# Patient Record
Sex: Female | Born: 1940
Health system: Southern US, Community
[De-identification: ages and names within clinical notes are randomized; demographics above are authoritative.]

## PROBLEM LIST (undated history)

## (undated) DIAGNOSIS — K219 Gastro-esophageal reflux disease without esophagitis: Secondary | ICD-10-CM

## (undated) DIAGNOSIS — IMO0001 Reserved for inherently not codable concepts without codable children: Secondary | ICD-10-CM

## (undated) DIAGNOSIS — J189 Pneumonia, unspecified organism: Secondary | ICD-10-CM

## (undated) DIAGNOSIS — G4730 Sleep apnea, unspecified: Secondary | ICD-10-CM

## (undated) DIAGNOSIS — I10 Essential (primary) hypertension: Secondary | ICD-10-CM

## (undated) DIAGNOSIS — E1140 Type 2 diabetes mellitus with diabetic neuropathy, unspecified: Secondary | ICD-10-CM

## (undated) DIAGNOSIS — M1990 Unspecified osteoarthritis, unspecified site: Secondary | ICD-10-CM

## (undated) DIAGNOSIS — N289 Disorder of kidney and ureter, unspecified: Secondary | ICD-10-CM

## (undated) DIAGNOSIS — E079 Disorder of thyroid, unspecified: Secondary | ICD-10-CM

## (undated) HISTORY — PX: CHOLECYSTECTOMY: SHX55

## (undated) HISTORY — DX: Essential (primary) hypertension: I10

## (undated) HISTORY — DX: Unspecified osteoarthritis, unspecified site: M19.90

## (undated) HISTORY — PX: PARTIAL HIP ARTHROPLASTY: SHX733

## (undated) HISTORY — PX: TEMPOROMANDIBULAR JOINT SURGERY: SHX35

## (undated) HISTORY — DX: Disorder of thyroid, unspecified: E07.9

## (undated) HISTORY — PX: TUBAL LIGATION: SHX77

## (undated) HISTORY — PX: PARTIAL KNEE ARTHROPLASTY: SHX2174

## (undated) HISTORY — PX: TONSILLECTOMY: SUR1361

## (undated) HISTORY — DX: Disorder of kidney and ureter, unspecified: N28.9

## (undated) HISTORY — DX: Pneumonia, unspecified organism: J18.9

---

## 1997-11-09 ENCOUNTER — Other Ambulatory Visit: Admission: RE | Admit: 1997-11-09 | Discharge: 1997-11-09 | Payer: Self-pay | Admitting: *Deleted

## 1997-12-27 ENCOUNTER — Ambulatory Visit (HOSPITAL_COMMUNITY): Admission: RE | Admit: 1997-12-27 | Discharge: 1997-12-27 | Payer: Self-pay | Admitting: *Deleted

## 1998-05-13 ENCOUNTER — Encounter: Payer: Self-pay | Admitting: Orthopedic Surgery

## 1998-05-16 ENCOUNTER — Inpatient Hospital Stay (HOSPITAL_COMMUNITY): Admission: RE | Admit: 1998-05-16 | Discharge: 1998-05-24 | Payer: Self-pay | Admitting: Orthopedic Surgery

## 1998-05-16 ENCOUNTER — Encounter: Payer: Self-pay | Admitting: Orthopedic Surgery

## 1998-06-20 ENCOUNTER — Encounter: Admission: RE | Admit: 1998-06-20 | Discharge: 1998-09-02 | Payer: Self-pay | Admitting: Orthopedic Surgery

## 1998-06-30 ENCOUNTER — Encounter: Payer: Self-pay | Admitting: Anesthesiology

## 1998-06-30 ENCOUNTER — Encounter: Admission: RE | Admit: 1998-06-30 | Discharge: 1998-09-28 | Payer: Self-pay | Admitting: Anesthesiology

## 1998-09-19 ENCOUNTER — Ambulatory Visit (HOSPITAL_BASED_OUTPATIENT_CLINIC_OR_DEPARTMENT_OTHER): Admission: RE | Admit: 1998-09-19 | Discharge: 1998-09-19 | Payer: Self-pay | Admitting: Orthopedic Surgery

## 1998-09-20 ENCOUNTER — Encounter: Admission: RE | Admit: 1998-09-20 | Discharge: 1998-11-07 | Payer: Self-pay | Admitting: Orthopedic Surgery

## 1998-11-22 ENCOUNTER — Encounter: Admission: RE | Admit: 1998-11-22 | Discharge: 1999-02-20 | Payer: Self-pay | Admitting: Orthopedic Surgery

## 1999-01-02 ENCOUNTER — Other Ambulatory Visit: Admission: RE | Admit: 1999-01-02 | Discharge: 1999-01-02 | Payer: Self-pay | Admitting: *Deleted

## 1999-03-22 ENCOUNTER — Ambulatory Visit (HOSPITAL_COMMUNITY): Admission: RE | Admit: 1999-03-22 | Discharge: 1999-03-22 | Payer: Self-pay | Admitting: *Deleted

## 1999-04-14 ENCOUNTER — Ambulatory Visit (HOSPITAL_COMMUNITY): Admission: RE | Admit: 1999-04-14 | Discharge: 1999-04-14 | Payer: Self-pay | Admitting: Gastroenterology

## 1999-05-11 ENCOUNTER — Encounter (INDEPENDENT_AMBULATORY_CARE_PROVIDER_SITE_OTHER): Payer: Self-pay | Admitting: Specialist

## 1999-05-11 ENCOUNTER — Other Ambulatory Visit: Admission: RE | Admit: 1999-05-11 | Discharge: 1999-05-11 | Payer: Self-pay | Admitting: *Deleted

## 1999-06-13 ENCOUNTER — Encounter (INDEPENDENT_AMBULATORY_CARE_PROVIDER_SITE_OTHER): Payer: Self-pay | Admitting: Specialist

## 1999-06-13 ENCOUNTER — Ambulatory Visit (HOSPITAL_COMMUNITY): Admission: RE | Admit: 1999-06-13 | Discharge: 1999-06-13 | Payer: Self-pay | Admitting: Obstetrics and Gynecology

## 2004-05-09 ENCOUNTER — Inpatient Hospital Stay (HOSPITAL_BASED_OUTPATIENT_CLINIC_OR_DEPARTMENT_OTHER): Admission: RE | Admit: 2004-05-09 | Discharge: 2004-05-09 | Payer: Self-pay | Admitting: Interventional Cardiology

## 2004-07-18 ENCOUNTER — Encounter: Admission: RE | Admit: 2004-07-18 | Discharge: 2004-10-16 | Payer: Self-pay | Admitting: Internal Medicine

## 2008-03-08 ENCOUNTER — Encounter: Admission: RE | Admit: 2008-03-08 | Discharge: 2008-06-06 | Payer: Self-pay | Admitting: Anesthesiology

## 2008-03-09 ENCOUNTER — Ambulatory Visit: Payer: Self-pay | Admitting: Anesthesiology

## 2008-03-23 ENCOUNTER — Ambulatory Visit: Payer: Self-pay | Admitting: Anesthesiology

## 2008-04-20 ENCOUNTER — Ambulatory Visit: Payer: Self-pay | Admitting: Anesthesiology

## 2008-05-18 ENCOUNTER — Ambulatory Visit: Payer: Self-pay | Admitting: Anesthesiology

## 2008-07-12 ENCOUNTER — Encounter: Admission: RE | Admit: 2008-07-12 | Discharge: 2008-07-13 | Payer: Self-pay | Admitting: Anesthesiology

## 2008-07-13 ENCOUNTER — Ambulatory Visit: Payer: Self-pay | Admitting: Anesthesiology

## 2008-09-15 ENCOUNTER — Encounter
Admission: RE | Admit: 2008-09-15 | Discharge: 2008-09-15 | Payer: Self-pay | Admitting: Physical Medicine & Rehabilitation

## 2008-09-16 ENCOUNTER — Ambulatory Visit: Payer: Self-pay | Admitting: Physical Medicine and Rehabilitation

## 2008-09-16 ENCOUNTER — Encounter
Admission: RE | Admit: 2008-09-16 | Discharge: 2008-12-15 | Payer: Self-pay | Admitting: Physical Medicine and Rehabilitation

## 2008-09-30 ENCOUNTER — Ambulatory Visit: Payer: Self-pay | Admitting: Physical Medicine and Rehabilitation

## 2008-10-28 ENCOUNTER — Ambulatory Visit: Payer: Self-pay | Admitting: Physical Medicine and Rehabilitation

## 2008-11-24 ENCOUNTER — Ambulatory Visit: Payer: Self-pay | Admitting: Physical Medicine and Rehabilitation

## 2008-12-23 ENCOUNTER — Encounter
Admission: RE | Admit: 2008-12-23 | Discharge: 2009-03-23 | Payer: Self-pay | Admitting: Physical Medicine and Rehabilitation

## 2009-01-05 ENCOUNTER — Ambulatory Visit: Payer: Self-pay | Admitting: Physical Medicine and Rehabilitation

## 2009-02-01 ENCOUNTER — Ambulatory Visit: Payer: Self-pay | Admitting: Physical Medicine and Rehabilitation

## 2009-02-04 ENCOUNTER — Encounter
Admission: RE | Admit: 2009-02-04 | Discharge: 2009-05-05 | Payer: Self-pay | Admitting: Physical Medicine & Rehabilitation

## 2009-02-07 ENCOUNTER — Ambulatory Visit: Payer: Self-pay | Admitting: Physical Medicine & Rehabilitation

## 2009-04-11 ENCOUNTER — Encounter
Admission: RE | Admit: 2009-04-11 | Discharge: 2009-07-10 | Payer: Self-pay | Admitting: Physical Medicine and Rehabilitation

## 2009-04-11 ENCOUNTER — Ambulatory Visit: Payer: Self-pay | Admitting: Physical Medicine and Rehabilitation

## 2009-05-11 ENCOUNTER — Ambulatory Visit: Payer: Self-pay | Admitting: Physical Medicine and Rehabilitation

## 2009-06-13 ENCOUNTER — Ambulatory Visit: Payer: Self-pay | Admitting: Physical Medicine and Rehabilitation

## 2009-07-04 ENCOUNTER — Encounter
Admission: RE | Admit: 2009-07-04 | Discharge: 2009-07-13 | Payer: Self-pay | Admitting: Physical Medicine and Rehabilitation

## 2009-07-13 ENCOUNTER — Ambulatory Visit: Payer: Self-pay | Admitting: Physical Medicine and Rehabilitation

## 2009-08-09 ENCOUNTER — Encounter
Admission: RE | Admit: 2009-08-09 | Discharge: 2009-11-07 | Payer: Self-pay | Admitting: Physical Medicine and Rehabilitation

## 2009-08-10 ENCOUNTER — Ambulatory Visit: Payer: Self-pay | Admitting: Physical Medicine and Rehabilitation

## 2009-09-07 ENCOUNTER — Ambulatory Visit: Payer: Self-pay | Admitting: Physical Medicine and Rehabilitation

## 2009-10-13 ENCOUNTER — Ambulatory Visit: Payer: Self-pay | Admitting: Physical Medicine and Rehabilitation

## 2009-11-14 ENCOUNTER — Encounter
Admission: RE | Admit: 2009-11-14 | Discharge: 2010-02-12 | Payer: Self-pay | Admitting: Physical Medicine and Rehabilitation

## 2009-11-16 ENCOUNTER — Ambulatory Visit: Payer: Self-pay | Admitting: Physical Medicine and Rehabilitation

## 2009-12-26 ENCOUNTER — Ambulatory Visit: Payer: Self-pay | Admitting: Physical Medicine and Rehabilitation

## 2010-01-25 ENCOUNTER — Ambulatory Visit: Payer: Self-pay | Admitting: Physical Medicine and Rehabilitation

## 2010-02-10 ENCOUNTER — Encounter
Admission: RE | Admit: 2010-02-10 | Discharge: 2010-04-28 | Payer: Self-pay | Admitting: Physical Medicine and Rehabilitation

## 2010-02-15 ENCOUNTER — Ambulatory Visit: Payer: Self-pay | Admitting: Physical Medicine and Rehabilitation

## 2010-03-15 ENCOUNTER — Ambulatory Visit: Payer: Self-pay | Admitting: Physical Medicine and Rehabilitation

## 2010-04-10 ENCOUNTER — Ambulatory Visit: Payer: Self-pay | Admitting: Physical Medicine and Rehabilitation

## 2010-04-18 ENCOUNTER — Ambulatory Visit (HOSPITAL_COMMUNITY)
Admission: RE | Admit: 2010-04-18 | Discharge: 2010-04-18 | Payer: Self-pay | Admitting: Physical Medicine and Rehabilitation

## 2010-04-28 ENCOUNTER — Encounter
Admission: RE | Admit: 2010-04-28 | Discharge: 2010-07-11 | Payer: Self-pay | Source: Home / Self Care | Attending: Physical Medicine and Rehabilitation | Admitting: Physical Medicine and Rehabilitation

## 2010-05-08 ENCOUNTER — Ambulatory Visit: Payer: Self-pay | Admitting: Physical Medicine and Rehabilitation

## 2010-06-01 ENCOUNTER — Encounter
Admission: RE | Admit: 2010-06-01 | Discharge: 2010-06-01 | Payer: Self-pay | Admitting: Physical Medicine and Rehabilitation

## 2010-06-14 ENCOUNTER — Ambulatory Visit: Payer: Self-pay | Admitting: Physical Medicine and Rehabilitation

## 2010-07-11 ENCOUNTER — Ambulatory Visit: Payer: Self-pay | Admitting: Physical Medicine and Rehabilitation

## 2010-08-21 ENCOUNTER — Ambulatory Visit: Payer: Worker's Compensation | Admitting: Physical Medicine and Rehabilitation

## 2010-08-21 ENCOUNTER — Ambulatory Visit: Payer: Worker's Compensation | Attending: Physical Medicine and Rehabilitation

## 2010-08-21 DIAGNOSIS — M76899 Other specified enthesopathies of unspecified lower limb, excluding foot: Secondary | ICD-10-CM | POA: Insufficient documentation

## 2010-08-21 DIAGNOSIS — E119 Type 2 diabetes mellitus without complications: Secondary | ICD-10-CM | POA: Insufficient documentation

## 2010-08-21 DIAGNOSIS — M1710 Unilateral primary osteoarthritis, unspecified knee: Secondary | ICD-10-CM

## 2010-08-21 DIAGNOSIS — G894 Chronic pain syndrome: Secondary | ICD-10-CM

## 2010-08-21 DIAGNOSIS — F3289 Other specified depressive episodes: Secondary | ICD-10-CM | POA: Insufficient documentation

## 2010-08-21 DIAGNOSIS — M47817 Spondylosis without myelopathy or radiculopathy, lumbosacral region: Secondary | ICD-10-CM | POA: Insufficient documentation

## 2010-08-21 DIAGNOSIS — I10 Essential (primary) hypertension: Secondary | ICD-10-CM | POA: Insufficient documentation

## 2010-08-21 DIAGNOSIS — E7800 Pure hypercholesterolemia, unspecified: Secondary | ICD-10-CM | POA: Insufficient documentation

## 2010-08-21 DIAGNOSIS — M545 Low back pain, unspecified: Secondary | ICD-10-CM

## 2010-08-21 DIAGNOSIS — F329 Major depressive disorder, single episode, unspecified: Secondary | ICD-10-CM | POA: Insufficient documentation

## 2010-08-21 DIAGNOSIS — E039 Hypothyroidism, unspecified: Secondary | ICD-10-CM | POA: Insufficient documentation

## 2010-08-21 DIAGNOSIS — G8929 Other chronic pain: Secondary | ICD-10-CM | POA: Insufficient documentation

## 2010-08-21 DIAGNOSIS — G589 Mononeuropathy, unspecified: Secondary | ICD-10-CM | POA: Insufficient documentation

## 2010-08-21 DIAGNOSIS — M25569 Pain in unspecified knee: Secondary | ICD-10-CM | POA: Insufficient documentation

## 2010-09-19 ENCOUNTER — Encounter: Payer: Worker's Compensation | Attending: Physical Medicine and Rehabilitation

## 2010-09-19 ENCOUNTER — Ambulatory Visit: Payer: Worker's Compensation

## 2010-09-19 DIAGNOSIS — E039 Hypothyroidism, unspecified: Secondary | ICD-10-CM | POA: Insufficient documentation

## 2010-09-19 DIAGNOSIS — M5126 Other intervertebral disc displacement, lumbar region: Secondary | ICD-10-CM | POA: Insufficient documentation

## 2010-09-19 DIAGNOSIS — M76899 Other specified enthesopathies of unspecified lower limb, excluding foot: Secondary | ICD-10-CM | POA: Insufficient documentation

## 2010-09-19 DIAGNOSIS — M47817 Spondylosis without myelopathy or radiculopathy, lumbosacral region: Secondary | ICD-10-CM | POA: Insufficient documentation

## 2010-09-19 DIAGNOSIS — G894 Chronic pain syndrome: Secondary | ICD-10-CM

## 2010-09-19 DIAGNOSIS — M545 Low back pain, unspecified: Secondary | ICD-10-CM

## 2010-09-19 DIAGNOSIS — E7800 Pure hypercholesterolemia, unspecified: Secondary | ICD-10-CM | POA: Insufficient documentation

## 2010-09-19 DIAGNOSIS — I10 Essential (primary) hypertension: Secondary | ICD-10-CM | POA: Insufficient documentation

## 2010-09-19 DIAGNOSIS — E119 Type 2 diabetes mellitus without complications: Secondary | ICD-10-CM | POA: Insufficient documentation

## 2010-09-19 DIAGNOSIS — M25559 Pain in unspecified hip: Secondary | ICD-10-CM | POA: Insufficient documentation

## 2010-09-19 DIAGNOSIS — G90529 Complex regional pain syndrome I of unspecified lower limb: Secondary | ICD-10-CM

## 2010-09-19 DIAGNOSIS — M79609 Pain in unspecified limb: Secondary | ICD-10-CM

## 2010-09-19 DIAGNOSIS — G8929 Other chronic pain: Secondary | ICD-10-CM | POA: Insufficient documentation

## 2010-09-19 DIAGNOSIS — F3289 Other specified depressive episodes: Secondary | ICD-10-CM | POA: Insufficient documentation

## 2010-09-19 DIAGNOSIS — G589 Mononeuropathy, unspecified: Secondary | ICD-10-CM | POA: Insufficient documentation

## 2010-09-19 DIAGNOSIS — M25569 Pain in unspecified knee: Secondary | ICD-10-CM | POA: Insufficient documentation

## 2010-09-19 DIAGNOSIS — F329 Major depressive disorder, single episode, unspecified: Secondary | ICD-10-CM | POA: Insufficient documentation

## 2010-10-16 ENCOUNTER — Encounter
Payer: Medicare Other | Attending: Physical Medicine and Rehabilitation | Admitting: Physical Medicine and Rehabilitation

## 2010-10-16 DIAGNOSIS — M76899 Other specified enthesopathies of unspecified lower limb, excluding foot: Secondary | ICD-10-CM | POA: Insufficient documentation

## 2010-10-16 DIAGNOSIS — G589 Mononeuropathy, unspecified: Secondary | ICD-10-CM | POA: Insufficient documentation

## 2010-10-16 DIAGNOSIS — M25559 Pain in unspecified hip: Secondary | ICD-10-CM | POA: Insufficient documentation

## 2010-10-16 DIAGNOSIS — M5126 Other intervertebral disc displacement, lumbar region: Secondary | ICD-10-CM | POA: Insufficient documentation

## 2010-10-16 DIAGNOSIS — M25569 Pain in unspecified knee: Secondary | ICD-10-CM

## 2010-10-16 DIAGNOSIS — M545 Low back pain, unspecified: Secondary | ICD-10-CM | POA: Insufficient documentation

## 2010-10-16 DIAGNOSIS — M79609 Pain in unspecified limb: Secondary | ICD-10-CM

## 2010-10-16 DIAGNOSIS — G90529 Complex regional pain syndrome I of unspecified lower limb: Secondary | ICD-10-CM

## 2010-11-24 ENCOUNTER — Encounter
Payer: Medicare Other | Attending: Physical Medicine and Rehabilitation | Admitting: Physical Medicine and Rehabilitation

## 2010-11-24 DIAGNOSIS — M25569 Pain in unspecified knee: Secondary | ICD-10-CM | POA: Insufficient documentation

## 2010-11-24 DIAGNOSIS — M76899 Other specified enthesopathies of unspecified lower limb, excluding foot: Secondary | ICD-10-CM

## 2010-11-24 DIAGNOSIS — G894 Chronic pain syndrome: Secondary | ICD-10-CM

## 2010-11-24 DIAGNOSIS — R269 Unspecified abnormalities of gait and mobility: Secondary | ICD-10-CM | POA: Insufficient documentation

## 2010-11-24 DIAGNOSIS — E039 Hypothyroidism, unspecified: Secondary | ICD-10-CM | POA: Insufficient documentation

## 2010-11-24 DIAGNOSIS — G589 Mononeuropathy, unspecified: Secondary | ICD-10-CM | POA: Insufficient documentation

## 2010-11-24 DIAGNOSIS — M47817 Spondylosis without myelopathy or radiculopathy, lumbosacral region: Secondary | ICD-10-CM | POA: Insufficient documentation

## 2010-11-24 DIAGNOSIS — E669 Obesity, unspecified: Secondary | ICD-10-CM | POA: Insufficient documentation

## 2010-11-24 DIAGNOSIS — M25559 Pain in unspecified hip: Secondary | ICD-10-CM | POA: Insufficient documentation

## 2010-11-24 DIAGNOSIS — M79609 Pain in unspecified limb: Secondary | ICD-10-CM | POA: Insufficient documentation

## 2010-11-24 DIAGNOSIS — M545 Low back pain, unspecified: Secondary | ICD-10-CM | POA: Insufficient documentation

## 2010-11-24 DIAGNOSIS — Z79899 Other long term (current) drug therapy: Secondary | ICD-10-CM | POA: Insufficient documentation

## 2010-11-24 DIAGNOSIS — E119 Type 2 diabetes mellitus without complications: Secondary | ICD-10-CM | POA: Insufficient documentation

## 2010-11-24 DIAGNOSIS — I10 Essential (primary) hypertension: Secondary | ICD-10-CM | POA: Insufficient documentation

## 2010-11-24 DIAGNOSIS — M5126 Other intervertebral disc displacement, lumbar region: Secondary | ICD-10-CM | POA: Insufficient documentation

## 2010-11-25 NOTE — Assessment & Plan Note (Signed)
[name] is a pleasant, 70 year old married woman who is [facility] for chronic pain complaints related to her low back, especially her left lower extremity and more recently her right lateral hip.  She is back in today for refill of her pain medication and she is requesting right hip injection.  She last had her right hip injected about 2 months ago, it helped somewhat.  She did have a flare-up of some left lower extremity pain last month and it lasted about 2 weeks and this has since improved.  Pain is typically worse with walking and standing, improves with rest and medication.  She can walk about 10 minutes at a time.  She is independent with self-care.  No problems with respect to review of systems that are new.  Past medical, social, and family history otherwise unchanged.  PHYSICAL EXAMINATION:  Blood pressure is 130/68, pulse 75, respirations 18, and 96% saturated on room air.  She is an obese woman who does not appear in any distress.  She is oriented x3.  Speech is clear.  Affect is bright.  She is alert, cooperative, and pleasant.  Follows commands without difficulty, answers my questions appropriately.  Cranial nerves and coordination are grossly intact.  The right trochanter is palpated, she has exquisite tenderness over this area today.  Internal and external rotation at her hips does not aggravate.  Gait is slightly antalgic.  IMPRESSION: 1. Right trochanteric bursitis. 2. History of chronic left knee pain with history of complex regional     pain syndrome in the left lower extremity. 3. Lumbago. 4. Lumbar spondylosis with far lateral disk protrusion at L3-4     impacting left L3 root.  Her medical problems include history of diabetes, hypothyroidism, currently on replacement, depression, hypercholesterolemia, and hypertension.  I reviewed various treatment options for her right lateral hip pain including doing nothing, physical  therapy, stretches, and using heat and nice.  She has had an injection in the past.  She is requesting repeat injection.  The patient was taken back to the ultrasound room and an ultrasound was carried out.  Static and real-time views in longitudinal and transverse orientation were obtained on this 70 year old female.  Longitudinal images at the posterior-inferior aspect of the greater trochanter did not reveal any hypoechoic fluid areas in the region of the subcutaneous bursa of the gluteus maximus.  There was no need to aspirate.  The more anterior gluteus medius bursa was also not in effused.  It was noted the depth to the greater trochanter was about 4.5 cm.  The right lateral hip was then cleaned up and swabbed with alcohol. Using a 25 gauge 75-mm needle, the right greater trochanter was injected with 1 mL of Celestone and 4 mL of 1% lidocaine.  Vapocoolant spray was used and she tolerated the procedure quite well.  Discharge instructions were given.  A consent was reviewed prior and signed.  I have answered all of her questions.  I have refilled her pain medications, morphine IR 15 mg 1 p.o. b.i.d., #60, and MS Contin 15 mg 1 p.o. q.3 p.m., #30.  I will see her back in a month.  She is comfortable with our treatment plan.     Paige Arnold, M.D.    DMK/MedQ D:  11/24/2010 12:40:05  T:  11/25/2010 01:28:13  Job #:  [id number]

## 2010-11-28 NOTE — Assessment & Plan Note (Signed)
Paige Arnold is a 70 year old woman who is a previous patient of Dr.  Jilda Panda.  Initially, he was seen by me on September 16, 2008.   At our last visit, she was placed on a long-acting Oramorph and short-  acting MSIR treatment today.  She was unable to obtain the Oramorph and  has been using just the short-acting MSIRs over the last month 15 mg 3  times a day.   She is back in today and states that pain is worse, 9 on a scale of 10  predominantly in the left knee.  Sleep is good, however.  Pain is worse  with walking and standing and improves with rest, heat, and medications.  Pain is described as aching and stabbing especially when she is up on  the right.  She reports a little relief with current meds.  Her  ambulation is limited at this time not more than 5-10 minutes.  She does  use a cane.  She is able to climb.  She does not drive.  She is  independent with self-care.   REVIEW OF SYSTEMS:  Recent bladder control problems, had been treated  for urinary tract infection recently by her primary care physician Dr.  Sandrea Hughs.  Positive for painful urination and intermittent constipation.   No other changes in past medical, social, or family history.   MEDICATIONS PROVIDED THROUGH THIS CLINIC CURRENTLY:  MSIR 15 mg 1 p.o.  t.i.d.   PHYSICAL EXAMINATION:  Blood pressure is 132/67, pulse 55, respirations  16, 98% saturated on room air.  She is well-developed, obese female who  does not appear in any distress.  She is oriented x3.  Speech is clear.  Affect is bright.  She is alert, cooperative, and pleasant.  Follows  commands without difficulty.  Answers questions appropriately.   Cranial nerves are grossly intact.  Coordination is intact.  Reflexes  are 2+ the right patellar and Achilles tendon.  She does not wish me to  test the left patellar tendon due to knee pain.  Left Achilles tendon is  1+.  No abnormal tone is noted.  No clonus is noted.  No tremors are  appreciated.   She  has good sensitivity over the left lateral knee with light touch,  also tenderness with palpation medially as well as laterally and along  the joint and below the joint line.   Motor strength, however, is in the 5/5 range, hip flexors, knee  extensors, dorsiflexors, and plantar flexors.   Transitioning from sitting to standing is done with ease.  However, gait  in the room is quite antalgic.  Decreased weightbearing to the left  lower extremity.   Lumbar range of motion is mildly limited as well.   IMPRESSION:  1. History of complex regional pain syndrome left lower extremity.  2. Left hip flexor weakness, mild.  3. History of low back pain.  4. MRI showed narrowing of the left L3-L4 foramen as well as      asymmetric disk which may be impacting her nerve root at the L3-L4      level.   PLAN:  Options to help to manage pain in the left lower extremity were  reviewed with her.  She is not interested in any kind of epidural  injections.  He would like to continue oral narcotics.  We will switch  her to Kadian 20 mg b.i.d. #60 as well as hydrocodone 5/325 mg 4 times a  day.  He denies  any particular drug allergies.  We will see her back  in 6 weeks.  She will follow in a couple of weeks to let me know how she  is doing on the Norco, may increase this to 7.5/325 if she is still  having a quite a bit of discomfort ambulating.           ______________________________  Franchot Gallo, M.D.     DMK/MedQ  D:  11/24/2008 13:26:11  T:  11/25/2008 02:18:13  Job #:  [id number]

## 2010-11-28 NOTE — Assessment & Plan Note (Signed)
Paige Arnold is a 70 year old woman, who I saw 2 weeks ago.  She had  been followed by Dr. Carlos Levering previously.  She has a history of work  injury while working for the [employer] back in 1997.  She underwent a partial knee replacement in 1999 by Dr. Percell Miller.  She had  chronic left knee pain since that time.   She is back in today for refill of her pain medications.  At the last  visit, urine drug screen was checked.  Her normalized value was elevated  and therefore her morphine doses were reduced.  At that time, she had  been interested in and seeing if she could continue her functional  activities on a much lower dosage.  She is back in today and states that  she is limping a lot more in her left knee, it is hurting her more than  it had been.  She is requesting a slight increase in her medications  again.  She was on 30 mg twice a day, had been reduced down to 15 mg  twice a day.   Her average pain at this time is about 9 on a scale of 10.  She had been  6 on a scale of 10 previously on the higher doses however.  Her sleep is  still good.  Pain interferes with worse with walking, bending, and  standing.  Improves with rest, rest, and medications.  She reports fair  relief with current meds.   She can walk 10-15 minutes at a time.  She does not climb stairs, nor  does she drive.  She is independent with self-care.   Admits to some depression.  Denies suicidal ideation.   No change in past medical, social, or family history since last visit.   Medications provided by this clinic include 15 mg of immediate-release  morphine once per day.  She is currently on 15 mg of MS Contin as well  b.i.d.   PHYSICAL EXAMINATION:  Blood pressure is 142/51, pulse 62, respiration  18, and 99% saturated on room air.  She is a mildly obese female, who  appears her stated age.   She is oriented to x3.  Speech is clear.  Affect is bright.  She is  alert, cooperative, and pleasant.   Follows commands without difficulty.  Answers questions appropriately.   Cranial nerves are notable for a slightly decreased hearing acuity.  Coordination is intact.  Reflexes are symmetric at the Achilles tendons.  She refuses to allow me to check her left reflex at the patellar tendon  and right patellar tendon is 2+.   Decreased sensation along the left lateral knee.  Motor strength is  notable for diminished left hip flexion, strength, as well as bilateral  EHL strength is 4-/5.  Complains of pain with extension of the lumbar  spine.  Tandem gait is performed with some difficulty.  Romberg test is  performed adequately.   Again, there is no temperature or color differences between right and  left knee.  No duskiness.  No obvious sudomotor changes are appreciated.  She does have some numbness along the lateral aspect of the left knee.   IMPRESSION:  1. History of complex regional pain syndrome, left lower extremity      variant.  2. Left hip flexor weakness.  3. History of low back pain.   PLAN:  I asked her to obtain MRI without contrast to evaluate etiology  for left hip flexor  weakness.  Lumbar radiographs were obtained on September 21, 2008, and were reviewed with her today.  She has multilevel  degenerative changes with some mild levoscoliosis, this was reviewed  with her.   Our plan will increase her MS Contin slightly to 15 mg t.i.d.  She does  not need a refill on her MSIR or morphine sulfate immediate release 15  mg, she takes it once a day.   She brought in her MS Contin today.  She has 15 pills left.  She did not  bring in her MSIR.  I reviewed with her the importance of bringing in  her narcotic pain medications, so we can do pill counts and monitor her  usage.  She states she will comply.  We will see her back in 1 month.           ______________________________  Franchot Gallo, M.D.     DMK/MedQ  D:  09/30/2008 13:19:12  T:  10/01/2008 00:36:58  Job  #:  [id number]

## 2010-11-28 NOTE — Procedures (Signed)
Paige Arnold, FENOGLIO NO.:  [id number]   MEDICAL RECORD NO.:  [id number]          PATIENT TYPE:  REC   LOCATION:  [facility]                          FACILITY:  [facility]   PHYSICIAN:  Charlett Blake, M.D.DATE OF BIRTH:  08-29-1940   DATE OF PROCEDURE:  02/07/2009  DATE OF DISCHARGE:                               OPERATIVE REPORT   PROCEDURE:  Left L3-L4 transforaminal lumbar epidural steroid injection  under fluoroscopic guidance.   INDICATION:  L3-4 radiculitis has narrowing of the L3-4 foramen with a  disk that appears to be impacting the L3 nerve root.  She has left thigh  and leg pain.  Pain is only partially responsive to medication  management and other conservative care.  Informed consent was obtained  after describing risks and benefits of the procedure with the patient.  These include bleeding, bruising, infection.  She elects to proceed and  has given written consent.  The patient placed prone on fluoroscopy  table.  Betadine prep, sterile drape, 25-gauge inch and half needle was  used to anesthetize the skin and subcutaneous tissue 1% lidocaine x2 mL.  A 22-gauge, 3-1/2 inch spinal needle was inserted under fluoroscopic  guidance starting at the left L3-4 intervertebral foramen, AP, lateral,  and oblique images utilized.  Omnipaque 180 under live fluoro  demonstrated good epidural and nerve root spread followed by injection  of 1 mL of 10 mg/mL dexamethasone 1 mL of 1% MPF lidocaine.  The patient  tolerated the procedure well.  Pre and post injection vitals stable.  Post injection instructions given.  Pre injection pain level 7/10.  Post  injection 5/10.  Follow with Dr. Lucia Estelle to see how this progresses  over time.      Charlett Blake, M.D.  Electronically Signed     AEK/MEDQ  D:  02/07/2009 13:11:02  T:  02/08/2009 02:47:12  Job:  [id number]   cc:   Barron Alvine

## 2010-11-28 NOTE — Procedures (Signed)
NAME:  Paige Arnold, HEDGE NO.:  [id number]   MEDICAL RECORD NO.:  [id number]         PATIENT TYPE:  [facility]REC   LOCATION:                                 FACILITY:   PHYSICIAN:  Rosann Auerbach, MD        DATE OF BIRTH:  26-Mar-1941   DATE OF PROCEDURE:  DATE OF DISCHARGE:                               OPERATIVE REPORT   [name] [facility].  I evaluated  her.  We obtained history and performed 14-point review of systems.  As  she is taking her medication more regularly now, she is finding benefit,  and will stay the course.  I will renew her medicine, with a do not  fill, determined further course of care and followup.  Reviewed the risk  of this medication, I reviewed treatment limitations and options.   Modifiable features in health profile discussed.  Home based therapy.   I will see her at 2 months, or if she is doing well we will transition  to our physiatry colleagues for the biomechanical model.   Objectively, improved, diffuse paralumbar myofascial pain with  suspension and side bending.  Nothing new neurologically.   IMPRESSION:  Degenerative spinal disease lumbar spine and otherwise  unchanged.   PLAN:  As outlined above.  Discharge instructions given.           ______________________________  Rosann Auerbach, MD     HH/MEDQ  D:  05/18/2008 14:41:08  T:  05/19/2008 03:35:39  Job:  [id number]

## 2010-11-28 NOTE — Assessment & Plan Note (Signed)
[facility]  I evaluated  her and reviewed the Health and History form and 14-point review of  systems.   1. Successfully transitioned off methadone without withdrawal, did      pretty well, and comes in with her meds, using very appropriately.  2. Her pain control could be enhanced, so I am going to put her on MS      Contin as a pharmacokinetic with long acting choice, superior to      methadone and its safety, and she has also has an MSIR.  I will      allow her to take one for breakthrough during the day or possibly      b.i.d. if necessary.  I have reviewed this with her with questions      answered.  3. Other modifiable features in health profile discussed.  Enhanced      activity level reviewed.  Review of opioid consent, patient care      agreement, risk of ward benefit reviewed.  Her knee is stable, I      observed this without any advancing pseudomotor changes.   Objectively as noted, the knee appears stable, no discoloration, edema,  and nothing new neurologically.  Her typical pain, difficulty with  ambulation and it does aggravate her low back, spondylotic pain, as she  has a modest radicular element.   IMPRESSION:  Osteoarthritis, and otherwise unchanged.  Possible complex  regional pain syndrome variant.   PLAN:  As outlined above.  Medication transitioned to relieve cycling.  I also will add Lyrica with review of this medication, this will be as  an adjunct, and follow her expectantly.  Her affect is bright and seems  to be doing well.           ______________________________  Rosann Auerbach, MD     HH/MedQ  D:  03/23/2008 11:34:59  T:  03/24/2008 00:29:52  Job #:  [id number]

## 2010-11-28 NOTE — Assessment & Plan Note (Signed)
[name] is a 70 year old woman, who had previously been a patient  of Dr. Carlos Arnold.  She was seen initially by me on September 16, 2008.   [facility] for chronic  left leg pain, predominately around the left knee.  She has been felt to  have a variant of complex regional pain syndrome.  She was also noted to  have some left hip flexor weakness and an MRI of her lumbar spine was  obtained, which was done on October 05, 2008, which showed an eccentric  bulge to the floor of the left foramen at L3-4 resulting in mild left  foraminal narrowing and mild central canal narrowing, also spondylosis  at other levels are noted as well.   The results were reviewed with her today.   Pain is about 4 on a scale of 10.  In the interim, she has had a  reduction in her morphine.  Prior to the reduction in her morphine, her  pain with a 9 on a scale of 10, currently it is at a 4.  She reports  overall good relief with her pain medications.  She has reduced some  pain medication on her own.  She currently takes one 15 mg long-acting  morphine at night and 2 short-acting morphine, one in the morning and  one in the afternoon and finds this to be helping.  She has some  concerns about the blue pill of the MS Contin.  She is concerned that  these may be causing some cystitis.  She has a long history of bladder  cystitis and is reluctant to continue taking the blue pills, which is  why she has weaned herself down off of the long-acting MS Contin.   FUNCTIONAL STATUS:  She is able to walk about 10 minutes at a time.  She  is able to climb stairs and drives.  She is independent with self-care.  She does complain of some painful urination.  I have asked her to follow  up with primary care to rule out UTI.   Past medical, social, family history are unchanged from previous visit.   PHYSICAL EXAMINATION:  VITAL SIGNS:  Blood pressure is 155/80, pulse 52,  respiration 18, and 100% saturation on room air.  GENERAL:  She is mildly obese, well-developed, well-nourished female,  who does not appear in any distress.  NEUROLOGIC:  She is oriented x3.  Speech is clear.  Affect is bright.  She is alert, cooperative, and pleasant.  Follows commands without  difficulty and answers questions appropriately.  Cranial nerves are  grossly intact.  Coordination is intact.  MUSCULOSKELETAL:  Reflexes are 2+ patellar tendons, 2+ in Achilles  tendons.  No abnormal tone is noted.  No clonus is noted.  No tremors  are appreciated.  Straight leg raise is negative bilaterally.  Exquisite  tenderness noted around the left knee with light touch.  Reports intact  sensation to pinprick throughout both right and left lower extremities.  Motor strength is generally 5/5 with the exception of left hip flexion,  which is 4/5 on left.   Some discomfort with forward flexion/extension.  She has some  limitations in forward flexion.   IMPRESSION:  1. History of complex regional pain syndrome, left lower extremity      variant.  2. Left hip flexor weakness.  3. History of low back pain.  4. Recent MRI showing some narrowing on the left at the  L3-4 foramina,      which may be impacting her left knee pain as well.   PLAN:  The patient is already taking Topamax 50 mg twice a day which  would address neuropathic pain as well.  We will not increase that.  She  is not sure she wants any further treatment regarding medications for  her leg.  She overall is quite functional.  She will consider epidural  steroid injection in the upcoming months.  However, at this time, she is  interested in overall reduction of morphine and to that end, we will  give her Oramorph 15 mg 1 p.o. q.p.m. #30, and we will also give her  morphine sulfate IR 15 mg 1 p.o. q.a.m. and 1 p.o. q.3 p.m.  We will see  her back in a month and continue to monitor her hip pain complaints.   Overall, she has  significantly reduced her morphine intake.  She had  been on 30 mg b.i.d. and dose of 15 mg immediate release 3-4 times a  day.  Overall, her pain scores are improved on lower doses at this time.  I will see her back in a month.           ______________________________  Paige Arnold, M.D.     DMK/MedQ  D:  10/28/2008 13:40:43  T:  10/29/2008 05:47:23  Job #:  [id number]

## 2010-11-28 NOTE — Assessment & Plan Note (Signed)
[facility].  I evaluated  her, obtained history, and performed 14-point review of systems.  1. She brings in her meds, and she is only halfway into prescription      that should be exhausted for the month.  She also brings in almost      a full Lyrica prescription, she states she is intolerant as it      causes her urine to burn.  She does have cystitis, ongoing, and      actually, I relayed to her that we use Lyrica to help with      interstitial cystitis.  We may want to switch her to Neurontin.  2. I am not going to give her another prescription at this time, but      we carefully write down and describe how to use these medications,      and she states that she has been having some inadequate pain      control, and it is probably because she has not been using them      correctly.  I also asked about her support system in her home, and      she states her husband will not be able to help her with the      medicine as he would not remember.  As we get further into the      history, it sounds like she has not been taking her medicines very      often, and so we would except frequent breakthrough, and once we      get her on track, I am hoping that we have better pain control.  I      do not want to make changes at this time, I do not want to give her      another prescription until we see where she is at with her ability      to self medicate.  She states that is not a problem.  We will      follow expectantly.  I would like her to come in for reassessment      in 2-3 weeks, and again bring all her meds.  3. We also may want to get a UDS at next visit.   Objectively, diffuse paralumbar __________.  Fortin test positive.  Her  knee is showing no advancing pseudomotor changes.  Stable here.  Nothing  new neurologically, oriented x3.   IMPRESSION:  Complex regional pain syndrome variant.   PLAN:  Conservative management.  Discharge  instructions given.           ______________________________  Rosann Auerbach, MD     HH/MedQ  D:  04/20/2008 09:55:48  T:  04/21/2008 [id number]  Job #:  [id number]

## 2010-11-28 NOTE — Assessment & Plan Note (Signed)
[facility]  I evaluated her and  reviewed the Health and History form and 14-point review of systems.   1. She comes today, stable to her presentation.  By my assessment and      by historical features, it is sounds like she has been pretty      stable in the past couple of years, but had significant      recrudescence, she has not really improved much or gone backwards,      and by general assessment is it maximum medical improvement.  To      this end, it is probably reasonable to consider a Workman's Comp      bowing out at some point, and just being managed medically.  I      reviewed this with her.  2. I do not see anything new from a neurological or musculoskeletal      perspective that requires further imaging or diagnostics.  I      reviewed that with her.  3. Lifestyle enhancements discussed; weight control and home-based      therapy.  Review of medications, side effects, and potential risks      and options, we will renew her MS Contin and MSIR, and cautions are      given.   Objectively, diffuse knee pain, but stable to presentation.  No evidence  of pseudomotor changes.  Nothing new neurologically.   IMPRESSION:  Osteoarthritis of the knee.   PLAN:  Conservative management.  Discharge instructions given.  No  barrier to communication.           ______________________________  Rosann Auerbach, MD     HH/MedQ  D:  07/13/2008 10:44:38  T:  07/14/2008 00:57:45  Job #:  [id number]

## 2010-11-28 NOTE — Assessment & Plan Note (Signed)
Paige Arnold is a 70 year old woman with chronic left leg pain.  She  has been followed by Dr. [facility]ever[facility] since August 2009.  She has a history of work  injury while working for the [employer] back in 1997.  She eventually, in 1999, she underwent a partial knee replacement by Dr.  Percell Miller.  She was treated for many years for chronic regional pain  syndrome of the left knee and left lower extremity.  She was seen at  [facility] as well.   Apparently, she has been on methadone for many years and was recently  switched over to morphine by Dr. Carlos Levering last fall.   She states that the left knee bothers her everyday.  She has difficulty  putting full weight through the knee.  She states that it hurts to touch  her knee even slightly.   She states her average pain is about 6 on a scale of 10.  Sleep is well.  Pain is worse with activities, improves with rest, heat, and medication.  She reports fairly good relief with current meds.   She also reports some intermittent low back pain as well which is made  worse with lifting-type activities.   FUNCTIONAL STATUS:  She is able to walk about 10 minutes at a time.  She  does use a cane.  She is able to climb stairs.  She has not driven since  [id number].  She is independent with self-care.   Denies problems controlling bowel or bladder.  Admits to some numbness  along the lateral aspect of the left knee.  Admits to depression.  Denies suicidal ideation.   REVIEW OF SYSTEMS:  Otherwise negative.   PAST MEDICAL HISTORY:  1. Positive for hypothyroidism.  2. Non-insulin-dependent diabetes mellitus.  3. B12 deficiency.  4. Hyperlipidemia.  5. Anxiety.  6. Depression.  7. Allergic rhinitis.  8. Esophageal reflux.  9. Constipation.  10.Osteopenia.   PAST SURGICAL HISTORY:  Remarkable for left knee surgery, partial  replacement, Dr. Percell Miller in 1999.  TMJ surgery.  Nose surgery.   SOCIAL  HISTORY:  The patient lives with her husband.  Denies alcohol or  alcohol use.  Denies smoking.  Denies illegal substance use.   FAMILY HISTORY:  Noted per chart.  Father deceased at age 15, coronary  artery disease.  Mother deceased at age 66, kidney failure and  hypertension.  Five siblings, 1 sister commits suicide at age 42 and one  brother with sarcoidosis.   CURRENT MEDICATIONS:  1. Levothyroxine.  2. Diovan.  3. Loratadine.  4. Actos.  5. Lexapro.  6. Pravastatin.  7. Topamax 50 mg 2 tablets three times a day.  8. Iron.  9. Centrum S.  10.Biotin.  11.Gelatin.  12.Cal-Citrate.  13.B12 shot.   Exam today, blood pressure is 120/49, pulse 52, respirations 18, 97%  saturated on room air.  [name] is an obese, well-developed, well-  nourished woman who does not appear in any distress.  She is oriented  x3.  Her speech is clear.  Her affect is somewhat flat.  She answers  questions appropriately.  She does have some trouble remembering details  of her history and I needed to refer to the her chart to hold these out.   Cranial nerves are grossly intact.  Coordination is intact.  Reflexes  are 2+, the patellar tendon on the right and 2+ Achilles tendons  bilaterally.  She refused to  allow me to check her reflex on the left  knee.   She has diminished sensation along the left knee to light touch.  She  also has diminished sensation to temperature along the lateral aspect of  her left knee as well to approximately the mid tibia region.   Her motor strength is 5/5 in the right lower extremity.  She has 4/5  strength of the left hip flexor, 5/5 knee flexors and knee extensors,  5/5 dorsi and plantar flexors, EHL is 4+/5 bilaterally.   Straight leg raise is negative.   She appears to have full extension of the left knee 180 degrees,  internal and external rotation at the hips do not bother, does not think  it exacerbate her pain, and she has a functional range of motion at  the  hips.   Transitioning from sitting to standing is done without difficulty.  She  does have an antalgic gait with decreased weightbearing through the left  lower extremity.  She has a good range of motion in the lumbar spine  with respective forward flexion with some limitations are noted with  extension.   There does not appear to be any temperature or color differences between  the right and left knee.  No duskiness.  She does have increased  sensitivity especially in the left lateral aspect of the left knee  region.  No obvious pseudomotor changes are noted.   Distal extremities are warm and dry with normal pulses with intact  pulses.   There is no edema noted in the lower extremities.   IMPRESSION:  1. Complex regional pain syndrome, left lower extremity variant.  2. Left hip flexor weakness.  3. Low back pain.   PLAN:  Discussion with [name] regarding her medications.  She states  she is interested in decreasing her overall morphine dosage.  She is  questioning me regarding how she can do this to decrease the amount of  long acting she is currently taking.  She is currently taking MS Contin  30 mg twice a day.  I have suggested that she will reduce it to 15 mg  one  time a day for 15 days then,  twice a day.  I will see her back in  2 weeks to monitor this dose reduction for her.  She is only taking 115  mg short acting morphine sulfate each day at 10:30 a.m.  She has 65  immediate release morphine left and I will not be filling this  medication for her today.  Urine drug screen was completed today and I  have requested a lumbar radiographs as well.  We will see her back in 2  weeks.           ______________________________  Franchot Gallo, M.D.     DMK/MedQ  D:  09/16/2008 13:37:44  T:  09/17/2008 02:18:01  Job #:  [id number]

## 2010-11-28 NOTE — Assessment & Plan Note (Signed)
[facility] as a referral  through the [facility].  Complaining of knee pain, left side.  She has had for number of years, defined as CRPS, lower extremity,  secondary to injury and subsequent surgical intervention.  Partial knee  repair, she does not describe classic pseudomotor changes, but was  thoroughly evaluated at [facility].  I am unclear if they had a 3-phase bone  scan performed, but recently at [facility] they have performed some  diagnostics and we will look into that.  I am not sure she has had nerve  conduction studies, but does not describe classic radicular elements.  She states she has pain in her knee, this was from a fall at work, and  has been back and forth with pain control strategies for sometime.  She  has not had interventional procedures.  Furthermore, she is an  individual that wants to be very active, and wants to move away from  methadone as she is concerned about this medication.  I had an extensive  discussion about methadone and its unique risk and potential  complications as well as drug interactions.  Of note, I see that she was  recently placed on Lexapro, and I discussed that methadone is a  difficult drug to move away from in the clinical setting, but I do  believe that pain control strategy can be enhanced.  She is not taking a  particular adjuncts such as Lyrica or central desensitization  strategies.  This includes desensitization maneuvers.  She has not had a  TENS unit.   Pain is 5/10, sits comfortably, pain is worst by walking, standing, and  most activities, improved with rest, heat, and some medications.  She is  taking methadone 10 mg 3 times a day, has been up to 40 mg, and has side  effects with occasional constipation, I reviewed these with her.  Difficulty with mobility.  Functional impairment, but she is retired and  disabled since 1999.   A 14-point review of systems and PMH are remarkable  for tubal ligation,  TMJ, undefined kidney disease, but she states good function.  She has  diabetes, thyroid disorder, hypertension, and a knee replacement as  noted.   SOCIAL HISTORY:  States not a smoker, no chemical, or alcohol  dependency.   FAMILY HISTORY:  Diabetes, hypertension, psychiatric disease undefined,  and heart disease.   Review of systems, family, and social history are otherwise  noncontributory with pain problem.   Medications and allergies are attached with chart.   PHYSICAL EXAMINATION:  GENERAL:  A pleasant obese female sitting  comfortably in bed.  Gait with a slight limp.  Affect appearance is  normal, oriented x3.  HEENT:  Otherwise, unremarkable.  CHEST:  Clear to auscultation and percussion with increased AP diameter.  HEART:  Regular rate and rhythm without rub, murmur, or gallop.  ABDOMEN:  Obese, benign.  No hepatosplenomegaly.  EXTREMITIES:  She has diffuse paralumbar myofascial discomfort with  Fortin test positive.  Pain with extension.  She has some modest  evidence of spondylitic disease on exam, probably secondary to physical  habitus and altered gait.  Her knees exhibits no specific pseudomotor  changes, but does have pain with most provocative maneuvers and range of  motion activities.  She has good popliteal and distal pulses, and the  knee otherwise appears stable.  No obvious effusion, well-healed  incision.   IMPRESSION:  Complex regional pain syndrome probably  variant.   PLAN:  1. At some point, we would recommend some diagnostic such as 3-phase      bone scan, or a diagnostic therapeutic lumbar sympathetic block.  2. Weight control would be emphasized for best outcome, maintain      contact with primary care.  3. She understands this pain control strategy, and the unique      characteristics of methadone suggest that we will not wean      methadone, we will switch agent, and be available for her through      transitional elements.   We will follow up with her in 1-2 weeks,      see how she does.  She has had morphine in the past.  I am going to      start with MSIR and follow the titratable schedule as I reviewed      extensively with her, no barrier to communication and questions      were answered 3 or 4 times a day, and if she has any withdrawal      symptoms, she will let us know, and we will titrate up or down      based on  her response.  She also understands potential side      effects of morphine, and I am clear on that.  If she is overly      sedated or has problems, she will let us know.  She is to go to      methadone b.i.d. for a day, and then transition to [facility] and stop      the methadone.  She understands that the potentially long half-life      with methadone particularly with Lexapro may preclude [place] in going      to t.i.d. q.i.d., and if she is overly sedated, she will let us      know, and we will hold off until we can see the elimination of      methadone.  I do not think this will be a problem as I go through      this with her.  She has apparently gone down to b.i.d. methadone      and did have some uncomfortable feelings, I do not think it is      classic withdrawal, but she was aware of it.   Discharge instructions given.  No barrier to communication.  We will  make a transition to a more suitable pain control strategy as we move  away from methadone as that will be our first step.  Discharge  instructions reviewed.  Nursing assistant.           ______________________________  Rosann Auerbach, MD     HH/MedQ  D:  03/09/2008 12:18:28  T:  09/11/202009 02:18:20  Job #:  [id number]   cc:   Kaylyn Lim and [place]  Attn:  Victory Dakin. [employer] [phone]

## 2010-12-01 NOTE — Cardiovascular Report (Signed)
NAMERHEGAN, CANGEMI NO.:  [id number]   MEDICAL RECORD NO.:  [id number]          PATIENT TYPE:  OIB   LOCATION:  6501                         FACILITY:  [facility]   PHYSICIAN:  Belva Crome III, M.D.DATE OF BIRTH:  [birth year]   DATE OF PROCEDURE:  05/09/2004  DATE OF DISCHARGE:                              CARDIAC CATHETERIZATION   PROCEDURE:  1.  Left heart catheterization.  2.  Selective coronary angiogram.  3.  Left ventriculography.   CARDIOLOGIST:  Belva Crome, M.D.   INDICATIONS FOR PROCEDURE:  The patient had a recent cardiac evaluation  because of an abnormal electrocardiogram which suggested prior anterior  infarction.  There was poor R-wave progression.  A Cardiolite study  demonstrated the suggestion of periapical infarction with ischemia, a  relatively small defect.  Because of these abnormalities, a coronary  angiography was performed to define the coronary anatomy.   DESCRIPTION OF PROCEDURE:  After an informed consent, a 4-French sheath was  placed in the right femoral artery using the modified Seldinger technique.  A 4-French A2 multi-purpose catheter was used for hemodynamic recordings,  left ventriculography by hand injection and a right coronary angiography.  A  3.5, 4-French left Judkins catheter was used for left coronary angiography.  The patient tolerated the procedure without complications.   RESULTS:  HEMODYNAMIC DATA  Aortic pressure:  140/69.  Left ventricular pressure:  143/13.   LEFT VENTRICULOGRAPHY:  Overall LV function is normal.  No mitral  regurgitation is noted.   CORONARY ANGIOGRAPHY  1.  LEFT MAIN CORONARY ARTERY:  Normal.  2.  LEFT ANTERIOR DESCENDING CORONARY ARTERY:  Normal.  This is a large      vessel that is trans-apical.  3.  CIRCUMFLEX CORONARY ARTERY:  The circumflex coronary artery is large and      normal.  It gives origin to four obtuse marginal branches.  4.  RIGHT CORONARY ARTERY:  The right coronary  artery is large.  It gives      origin to a large PDA and two left ventricular branches.  This vessel is      normal.   CONCLUSION:  1.  Normal coronary arteries.  2.  Normal left ventricular function.  3.  Poor R-wave progression on electrocardiogram and the apical and      periapical defect on Cardiolite represent      false/positive phenomenon and are not related to coronary artery disease      or any evidence of structural heart disease or previous infarction.   PLAN:  No specific cardiac therapy or further evaluation is needed.  The  patient is cleared for surgery.       HWS/MEDQ  D:  05/09/2004  T:  05/09/2004  Job:  [id number]   cc:   Harriette Bouillon, M.D.  [place], [place]. [place] [address]  Fax: [place] Towanda Malkin, M.D.  [address]  [place]  [place] [address]  Fax: [phone]

## 2010-12-01 NOTE — Op Note (Signed)
[facility]  Patient:    Paige Arnold                          MRN: [id number] Proc. Date: 06/13/99 Adm. Date:  [id number] Attending:  Servando Salina                           Operative Report  PREOPERATIVE DIAGNOSIS:       Postmenopausal bleeding.  POSTOPERATIVE DIAGNOSIS:      Postmenopausal bleeding.  Probable endometrial polyp.  OPERATION:                    Diagnostic hysteroscopy, dilatation and curettage.  SURGEON:                      Sheronette A. Garwin Brothers, M.D.  ASSISTANT:  ANESTHESIA:                   MAC, paracervical block.  ESTIMATED BLOOD LOSS:  INDICATIONS:                  A 71 year old female on continuous hormone replacement therapy with persistent vaginal bleeding and a thickened endometrium on ultrasound who now presents for further evaluation and management.  Risks and benefits of both procedures had been explained to the patient.  Consent was signed. The patient was transferred to the operating room.  DESCRIPTION OF PROCEDURE:     Due to the patients history of left knee replacement, she was positioned using Allen stirrups while awake.  The patient ultimately was placed in the dorsal lithotomy position.  The patient was examined while in the dorsal lithotomy position.  She, however, was not tolerant of the procedure and the decision was initially made to forego the monitored anesthesia care and do an intubation which subsequently was not successful.  The patient therefore was reverted back to a monitored anesthesia.  Examination had revealed an anteverted, normal size uterus.  No adnexal masses.  The patient was sterilely prepped and draped in the usual fashion.  The bladder was catheterized for a small amount of urine.  Bivalve speculum was placed in the vagina.  21 cc total of 1%  Nesocaine was used for a paracervical block at 3 and 9 oclock as well as on the  anterior lip of the cervix.  The cervix was  then grasped with a single tooth tenaculum.  The cervical os which was noted to have some blood at its os was serially dilated up to a #27 Pratt dilator.   A Sorbitol primed video hysteroscope was introduced into the uterine cavity without difficulty.  Panoramic view was hen obtained.  The bivalve speculum was removed.  Both tubal ostia could be seen and appeared to be sclerosed.  Some atrophic changes in the fundal area of the uterus was noted.  Slight thickening of the anterior endometrium was noted.  A small polypoid lesion was noted on the posterior wall of the uterus at around 5 oclock. No endocervical lesions were noted.  The hysteroscope was then removed.  The uterine cavity was then curetted and the hysteroscope was then reinserted. Inspection of the uterine cavity was notable for the polypoid lesion being no longer present.  At that point all instruments were then removed from the vagina. Specimen was endometrial curetting and possible endometrial polyp.  Estimated blood loss was minimal.  The Sorbitol deficit  was 80.  Complications were none.  The patient tolerated the procedure well and was transferred to the recovery room in stable condition. DD:  06/13/99 TD:  06/13/99 Job: [id number]

## 2010-12-13 NOTE — Assessment & Plan Note (Signed)
Paige Arnold is a pleasant 70 year old woman who is followed in our [facility].  She has a long history of chronic pain dating back to the late 1990s.  She was last seen by me on August 21, 2010.  At that time, she underwent a right lateral hip injection for trochanteric bursitis.  She reports that the injection helped somewhat, but she still continues to have some right lateral hip pain which bothers her and aggravates her when she is up walking.  She has been using some ice on the area which helps a little bit.  Average pain is about 6 on a scale of 10, which is improved from her visit at that time in February is an 8 on a scale of 10.  Pain is typically worse during the daytime and early evening worse with walking, prolonged sitting, bending.  Improves with rest, heat medication, and icing.  She reports good relief with current meds.  She also has complaints of some left knee pain which has been chronic. She has a history of complex regional pain syndrome in this left lower extremity.  Last fall, she underwent MRI which showed a L3-4 far lateral disk protrusion impacting her left L3 nerve root.  She has not been interested in following up with any kind of surgeon to further explore treatment options regarding this.  She is not at all interested in injection of her spine at this time as well.  Functional status, she can walk 10-15 minutes at a time.  She climbs stairs.  She is independent with self-care.  She does do some grocery shopping and she can make some simple meals.  Review of systems, negative for problems controlling bowel or bladder. Denies constipation.  She does use MiraLax at times for this.  Denies suicidal ideation.  No other changes in past medical, social or family history since previous visit.  [facility]ications [facility] include morphine sulfate immediate release 15 mg twice a day, MS Contin 1 p.o. daily  at 3:00 p.m., p.r.n. Voltaren gel to the left knee, and p.r.n. Lidoderm patch to left knee as well, and Topamax 50 mg b.i.d.  PHYSICAL EXAMINATION:  Blood pressure is 141/77, pulse 59, respirations 18, 92% saturated on room air.  She is well-developed obese woman who appears her stated age and does not appear in any distress.  She is oriented x3.  Speech is clear.  Affect is bright.  She is alert, cooperative, and pleasant.  Follows commands without difficulty. Answers my questions appropriately.  Cranial nerves and coordination are intact.  Her reflexes are 1+ at the right patellar tendon.  She does not want me to tap her left patellar tendon.  She has diminished reflexes at bilateral Achilles tendons.  She has hypersensitivity with light touch over the left knee.  Otherwise, intact sensation.  Motor strength is good in both lower extremities.  She has half a grade of weakness in the left hip flexor compared to right hip flexor.  Internal and external rotation of the hips does not aggravate her pain. Palpation down over the left trochanter and down the iliotibial band is somewhat tender.  She is exquisitely tender, however, over the right lateral hip and down the iliotibial band.  Gait is nonantalgic.  Tandem gait and Romberg test are performed adequately.  She has a rather well-preserved forward flexion in lumbar spine, somewhat limited lumbar extension is noted however.  IMPRESSION: 1. Right trochanteric bursitis somewhat  improved after injection,     however, still problematic. 2. Chronic left knee pain with history of complex regional pain     syndrome of the left lower extremity. 3. History of far lateral disk protrusion noted at L3-4 impacting left     nerve root. 4. Intermittent lumbago.  Her medical problems include history of diabetes, hypothyroidism, depression, hypercholesterolemia and hypertension.  PLAN:  At this point, she is preferring conservative management.   She is not interested in injection of her spine or seeing a surgeon at this time regarding her disk protrusion.  She is interested in repeat injection of the right hip.  We will set her up for ultrasound followed by injection in the upcoming weeks and she will continue to use some icing.  Also, recommended some physical therapy.  She has been somewhat reluctant to pursue this avenue, I have suggested at least getting some general stretching exercises for her lower extremities.  She is considering this.  We will increase her Topamax from 50 mg twice a day to 50 mg t.i.d.  I will refill her morphine immediate release 15 mg 1 p.o. b.i.d. as well as her MS Contin 50 mg 1 p.o. daily at 3:00 p.m.  She does not need refills on her Voltaren gel or her Lidoderm patch.  I have answered all her questions.  She is comfortable with our management plan.     Franchot Gallo, M.D. Electronically Signed    DMK/MedQ D:  10/16/2010 13:35:45  T:  10/17/2010 06:26:16  Job #:  [id number]

## 2010-12-19 ENCOUNTER — Encounter: Payer: Worker's Compensation | Attending: Physical Medicine and Rehabilitation

## 2010-12-19 DIAGNOSIS — E039 Hypothyroidism, unspecified: Secondary | ICD-10-CM | POA: Insufficient documentation

## 2010-12-19 DIAGNOSIS — M25559 Pain in unspecified hip: Secondary | ICD-10-CM | POA: Insufficient documentation

## 2010-12-19 DIAGNOSIS — M545 Low back pain, unspecified: Secondary | ICD-10-CM

## 2010-12-19 DIAGNOSIS — M5126 Other intervertebral disc displacement, lumbar region: Secondary | ICD-10-CM | POA: Insufficient documentation

## 2010-12-19 DIAGNOSIS — E119 Type 2 diabetes mellitus without complications: Secondary | ICD-10-CM | POA: Insufficient documentation

## 2010-12-19 DIAGNOSIS — G90529 Complex regional pain syndrome I of unspecified lower limb: Secondary | ICD-10-CM

## 2010-12-19 DIAGNOSIS — M47817 Spondylosis without myelopathy or radiculopathy, lumbosacral region: Secondary | ICD-10-CM | POA: Insufficient documentation

## 2010-12-19 DIAGNOSIS — M76899 Other specified enthesopathies of unspecified lower limb, excluding foot: Secondary | ICD-10-CM | POA: Insufficient documentation

## 2010-12-19 DIAGNOSIS — E7800 Pure hypercholesterolemia, unspecified: Secondary | ICD-10-CM | POA: Insufficient documentation

## 2010-12-19 DIAGNOSIS — M25569 Pain in unspecified knee: Secondary | ICD-10-CM | POA: Insufficient documentation

## 2010-12-19 DIAGNOSIS — I10 Essential (primary) hypertension: Secondary | ICD-10-CM | POA: Insufficient documentation

## 2010-12-19 DIAGNOSIS — G894 Chronic pain syndrome: Secondary | ICD-10-CM

## 2010-12-19 DIAGNOSIS — G589 Mononeuropathy, unspecified: Secondary | ICD-10-CM | POA: Insufficient documentation

## 2010-12-19 DIAGNOSIS — M79609 Pain in unspecified limb: Secondary | ICD-10-CM

## 2010-12-19 DIAGNOSIS — G8929 Other chronic pain: Secondary | ICD-10-CM | POA: Insufficient documentation

## 2011-01-15 ENCOUNTER — Encounter
Payer: Worker's Compensation | Attending: Physical Medicine and Rehabilitation | Admitting: Physical Medicine and Rehabilitation

## 2011-01-15 DIAGNOSIS — M545 Low back pain, unspecified: Secondary | ICD-10-CM

## 2011-01-15 DIAGNOSIS — M76899 Other specified enthesopathies of unspecified lower limb, excluding foot: Secondary | ICD-10-CM | POA: Insufficient documentation

## 2011-01-15 DIAGNOSIS — M25569 Pain in unspecified knee: Secondary | ICD-10-CM | POA: Insufficient documentation

## 2011-01-15 DIAGNOSIS — M5126 Other intervertebral disc displacement, lumbar region: Secondary | ICD-10-CM | POA: Insufficient documentation

## 2011-01-15 DIAGNOSIS — G8929 Other chronic pain: Secondary | ICD-10-CM | POA: Insufficient documentation

## 2011-01-15 DIAGNOSIS — F329 Major depressive disorder, single episode, unspecified: Secondary | ICD-10-CM | POA: Insufficient documentation

## 2011-01-15 DIAGNOSIS — F3289 Other specified depressive episodes: Secondary | ICD-10-CM | POA: Insufficient documentation

## 2011-01-15 DIAGNOSIS — E039 Hypothyroidism, unspecified: Secondary | ICD-10-CM | POA: Insufficient documentation

## 2011-01-15 DIAGNOSIS — Z79899 Other long term (current) drug therapy: Secondary | ICD-10-CM | POA: Insufficient documentation

## 2011-01-15 DIAGNOSIS — E119 Type 2 diabetes mellitus without complications: Secondary | ICD-10-CM | POA: Insufficient documentation

## 2011-01-15 DIAGNOSIS — G894 Chronic pain syndrome: Secondary | ICD-10-CM

## 2011-01-15 DIAGNOSIS — R5381 Other malaise: Secondary | ICD-10-CM | POA: Insufficient documentation

## 2011-01-15 DIAGNOSIS — G589 Mononeuropathy, unspecified: Secondary | ICD-10-CM | POA: Insufficient documentation

## 2011-01-15 DIAGNOSIS — E7800 Pure hypercholesterolemia, unspecified: Secondary | ICD-10-CM | POA: Insufficient documentation

## 2011-01-15 DIAGNOSIS — I10 Essential (primary) hypertension: Secondary | ICD-10-CM | POA: Insufficient documentation

## 2011-01-15 DIAGNOSIS — M47817 Spondylosis without myelopathy or radiculopathy, lumbosacral region: Secondary | ICD-10-CM | POA: Insufficient documentation

## 2011-01-16 NOTE — Assessment & Plan Note (Signed)
[name] is a pleasant 70 year old married woman who is followed at our center for pain and rehabilitative medicine.  She is followed for multiple chronic pain complaints which include low back pain related to lumbar spondylosis.  She does have a far lateral disk protrusion at L3- L4 impacting left L3 root.  She also has a history of left partial knee replacement per Dr. Percell Miller in Q000111Q which is complicated by a long history of complex regional pain syndrome.  Over the last year, she has had some problems intermittently with trochanteric bursitis.  Her average pain is about 6 on a scale of 10.  She underwent hip injection for bursitis back in May.  She reports little relief with this injection.  Pain moderately interferes with activity, it is worsened by walking, bending, improves with rest, heat, medications.  She also reports a recent fall back in early June.  She was seen by primary care, had some workup which is not available to me.  She had a fall as she was getting out of bed.  She was dizzy at that time.  Apparently, she has had an EKG and head CT and she believes some other diagnostic tests which she cannot remember.  Functional status is essentially unchanged.  She is independent with self-care.  She can walk 10 minutes at a time.  She is able to climb stairs.  She does not drive.  She reports no problems with controlling bowel or bladder.  Denies suicidal ideation, has occasional problems with constipation, painful urination, variations in blood sugar.  She follows up with primary care for these issues.  Past medical, social, family history otherwise unchanged.  Medications prescribed through center for pain include the following: 1. Morphine sulfate immediate release 15 mg b.i.d. 2. MS Contin extended release 15 mg daily at 3:00 p.m. 3. Voltaren gel p.r.n. 4. Lidoderm p.r.n. 5. Topamax 50 mg one p.o. t.i.d.  PHYSICAL EXAMINATION:  VITAL SIGNS:  Blood pressure is  130/68, pulse 60, respirations 12, 96% saturated on room air. GENERAL:  She is an obese woman who does not appear in any distress. She is oriented x3.  Speech is clear.  Affect is bright.  She is alert, cooperative, and pleasant.  Follows commands without difficulty, answers my questions appropriately.  Cranial nerves coordination are intact.  Reflexes are diminished in upper and lower extremities.  No abnormal tone, clonus, or tremors are noted.  Motor strength is good in both upper and lower extremities without focal deficit.  Transitions easily from sitting to standing.  Gait is slightly uneven. She has relatively well-preserved forward flexion in the lumbar spine without pain.  Extension is without pain today as well, range of motion in her neck is relatively well preserved without discomfort.  She has relatively good range of motion in her shoulders as well.  She has some tenderness to palpation in the mid thoracic region and in the low lumbar region and paraspinal muscles.  She also is tender over the trochanters right greater than left.  IMPRESSION: 1. Lumbago with history of lumbar spondylosis known far lateral disk     protrusion L3-4 impacting left L3 root. 2. Chronic left knee pain status post partial knee replacement on the     left complicated by complex regional pain syndrome, Dr. Percell Miller     1999. 3. Intermittent trochanteric bursitis, right greater than left over     the last year recent injection helped very little 4. Decondition. 5. Medical problems include  history of diabetes, hypothyroidism,     currently on replacement, depression, hypercholesterolemia, and     hypertension.  PLAN:  I reviewed variety of right treatment options for her bursitis for her hip bursitis today.  At this point, she is interested in pursuing physical therapy program.  I have written orders for her.  She will be attending therapy in the [place] area emphasizing neutral spine.  We  would like them to work on lower extremity flexibility and strengthening permitting use of modalities, heat, ice, ultrasound as needed home program.  Emphasis on a home program.  I have suggested with a history of fall and some spinal tenderness, consider radiographs.  She would like to defer this today as well.  Her pill counts are appropriate.  She has been taking medications as prescribed.  Her last urine drug screen was July 12, 2011, which was consistent, her pill counts are appropriate.  She notes rather good relief with current medications.  She tells me that she really would not be able to walk without her medicines.  I answered all her questions.  She is comfortable with our treatment plan at this time, may consider repeat trochanteric bursitis injection, however at this time she would like to defer this as well.  I will see her back in 2 months.  We will have nurse practitioner Judeth Horn, see her next month for refill of her pain medications.     Franchot Gallo, M.D. Electronically Signed    DMK/MedQ D:  01/15/2011 11:52:37  T:  01/16/2011 01:04:14  Job #:  [id number]

## 2011-02-15 ENCOUNTER — Encounter: Payer: Medicare Other | Attending: Neurosurgery | Admitting: Neurosurgery

## 2011-02-15 DIAGNOSIS — M76899 Other specified enthesopathies of unspecified lower limb, excluding foot: Secondary | ICD-10-CM

## 2011-02-15 DIAGNOSIS — M25569 Pain in unspecified knee: Secondary | ICD-10-CM | POA: Insufficient documentation

## 2011-02-15 DIAGNOSIS — M545 Low back pain, unspecified: Secondary | ICD-10-CM | POA: Insufficient documentation

## 2011-02-15 DIAGNOSIS — M1710 Unilateral primary osteoarthritis, unspecified knee: Secondary | ICD-10-CM

## 2011-02-15 DIAGNOSIS — M47817 Spondylosis without myelopathy or radiculopathy, lumbosacral region: Secondary | ICD-10-CM | POA: Insufficient documentation

## 2011-02-15 DIAGNOSIS — Z96659 Presence of unspecified artificial knee joint: Secondary | ICD-10-CM | POA: Insufficient documentation

## 2011-02-15 NOTE — Assessment & Plan Note (Signed)
ACCOUNT:  [id number].  This is a patient of Dr. Lucia Estelle, who was seen here for multiple chronic pain complaints as well as low back pain and lumbar spondylosis. She reports no change in her condition today, just the need for med refills.  Her Oswestry score is 60.  Average pain is 8, sharp pain. Average activity is 7-9.  Pain is the same 24 hours a day.  Sleep patterns are fair.  Pain is worse with walking and activity.  Heat, rest, and medication tend to help.  She uses a cane for ambulation.  She does climb steps and drive.  She is on disability since 1999.  REVIEW OF SYSTEMS:  Notable for difficulties as described above, otherwise, within normal limits.  PAST MEDICAL HISTORY:  Unchanged.  SOCIAL HISTORY:  She is married.  FAMILY HISTORY:  Unchanged.  PHYSICAL EXAM:  VITAL SIGNS:  Blood pressure 117/60, pulse 61, respirations 14, O2 sats 96 on room air. NEUROLOGIC:  Constitutionally, she is within normal limits.  She is alert and oriented x3.  Her sensation and strength are intact.  IMPRESSION: 1. Lumbago. 2. Left knee surgical pain status post a partial knee replacement. 3. History of trochanteric bursitis on the right.  PLAN: 1. Refill morphine sulfate IR 15 mg one p.o. b.i.d., #60 with no     refill. 2. MS Contin ER 15 mg one p.o. daily at 3 p.m., #30 with no refill. 3. Topamax 50 mg one p.o. t.i.d., #90 with two refills.  She already     has an appointment with Dr. Lucia Estelle in 1 month.  Her questions     were encouraged and answered.     Paige Arnold L. Blenda Nicely Electronically Signed    RLW/MedQ D:  02/15/2011 11:33:56  T:  02/15/2011 14:10:37  Job #:  [id number]

## 2011-03-21 ENCOUNTER — Encounter
Payer: Worker's Compensation | Attending: Physical Medicine and Rehabilitation | Admitting: Physical Medicine and Rehabilitation

## 2011-03-21 DIAGNOSIS — M76899 Other specified enthesopathies of unspecified lower limb, excluding foot: Secondary | ICD-10-CM

## 2011-03-21 DIAGNOSIS — M25569 Pain in unspecified knee: Secondary | ICD-10-CM | POA: Insufficient documentation

## 2011-03-21 DIAGNOSIS — Z96659 Presence of unspecified artificial knee joint: Secondary | ICD-10-CM | POA: Insufficient documentation

## 2011-03-21 DIAGNOSIS — M79609 Pain in unspecified limb: Secondary | ICD-10-CM | POA: Insufficient documentation

## 2011-03-21 DIAGNOSIS — M545 Low back pain, unspecified: Secondary | ICD-10-CM | POA: Insufficient documentation

## 2011-03-21 DIAGNOSIS — M1710 Unilateral primary osteoarthritis, unspecified knee: Secondary | ICD-10-CM

## 2011-03-21 DIAGNOSIS — M25559 Pain in unspecified hip: Secondary | ICD-10-CM | POA: Insufficient documentation

## 2011-03-21 DIAGNOSIS — M47817 Spondylosis without myelopathy or radiculopathy, lumbosacral region: Secondary | ICD-10-CM | POA: Insufficient documentation

## 2011-03-21 DIAGNOSIS — G589 Mononeuropathy, unspecified: Secondary | ICD-10-CM | POA: Insufficient documentation

## 2011-03-21 DIAGNOSIS — G894 Chronic pain syndrome: Secondary | ICD-10-CM

## 2011-03-21 NOTE — Assessment & Plan Note (Signed)
[name] is a pleasant 70 year old married woman who is followed here at [facility] for chronic pain complaints related to her left lower extremity.  She also has some complaints of back pain and right lateral hip pain.  She has a far lateral disk protrusion at L3-4 impacting her left L3 root per MRI which was done back in November 2011.  She also has a history of partial knee replacement per Dr. Percell Miller in Q000111Q which was complicated by history of complex regional pain syndrome.  She is back in today.  She continues to have some lateral hip pain bilaterally left worse than right.  She had her right greater trochanter bursa injected back in May of this year.  This helped her significantly.  She has been in a physical therapy program now.  Has had about 4 visits.  She thinks that some of the stretching exercises make her lateral hip pain worse.  Her average pain is 6 today.  Sometimes it goes up to about an 8.  Pain is worse in the morning, daytime, and evening, not as bad at night. Pain is worse with walking, bending, and standing.  Improves with rest, heat, ice, medication.  She reports fair to good relief with current meds.  Medications prescribed through center for pain include: 1. Morphine sulfate immediate release 15 mg b.i.d. 2. MS Contin 15 mg daily at 3:00 p.m. p.r.n. 3. Voltaren gel. 4. Lidoderm.  She uses on a daily basis over the left knee. 5. Topamax 50 mg t.i.d.  FUNCTIONAL STATUS:  She had difficulty standing for prolonged periods of time.  She is able to climb stairs.  She does not drive.  She is independent with self-care.  She does some cooking but is having more trouble standing at this toe for any length of time.  No changes with respect to review of systems.  Denies problems with bowel or bladder.  Denies any new numbness, tingling, weakness.  Denies depression, anxiety, and suicidal ideation.  She does report  occasional constipation and some urinary problems.  I asked her to follow up with primary care for these issues.  No other changes in past medical, social, or family history.  PHYSICAL EXAMINATION:  Blood pressure is 137/60, pulse 60, respiration 18, 96% saturated on room air.  She is an obese woman who does not appear in any distress.  She is oriented x3.  Speech is clear.  Affect bright.  She is alert, cooperative, and pleasant.  Follows commands without difficulty, answers my questions appropriately.  Cranial nerves, coordination are intact.  Reflexes are diminished in the lower extremities.  She defers having me tap her left patellar tendon.  No new sensory changes are noted.  Her motor strength is good in both lower extremities, 5/5 at hip flexors, knee extensors, dorsiflexors, plantar flexors.  She has quite a bit of tenderness over both trochanters.  Forward flexion does not exacerbate her pain, however, extension does bother her.  Her gait is stable.  Tandem gait and Romberg test are performed adequately.  IMPRESSION: 1. Chronic left knee pain status post partial knee replacement on the     left complicated by a complex regional pain syndrome, Dr. Percell Miller in     [place] with known lumbar spondylosis. 3. History of bilateral disk protrusion at L3-4 impacting left L3     root. 4. Bilateral trochanteric bursitis, left greater than right.  The     patient is  status post a right trochanter injection back in May     2012, this helped her.  Her medical problems include history of diabetes, hypothyroidism, depression, hypercholesterolemia, and hypertension.  PLAN:  I reviewed her current home exercise program.  Some of the stretching exercises have aggravated her greater trochanter region.  I believe these are the stretches that she has been doing. . I have asked her to back off slightly on particular aspects of her exercise program, to not give them up entirely but  to decrease the number and the amount of stretch that she is doing.  I have asked her to start icing regularly after her exercise as well.  We have discussed considering consideration of left hip injection.  At this point she would like to defer this.  Regarding her back and lumbar disk protrusions, she is currently not interested in following up with any surgeon at this time for input.  She has been taking her medications as prescribed.  Her pill counts are appropriate.  Her last urine drug screen was June 2011 and was consistent.  She is currently comfortable with the treatment plan.  I refilled her morphine immediate release as well as MS Contin and gave her a 19-month supply of her Lidoderm patches for her knee.  I will see her back in 2 months.  She will continue to do her exercise program. She will follow up next month with nurse practitioner for pill count and refill of her pain medications.     Franchot Gallo, M.D. Electronically Signed    DMK/MedQ D:  03/21/2011 11:54:17  T:  03/21/2011 13:45:35  Job #:  [id number]

## 2011-04-13 ENCOUNTER — Ambulatory Visit: Payer: Self-pay | Admitting: Physical Medicine and Rehabilitation

## 2011-04-18 ENCOUNTER — Encounter: Payer: Worker's Compensation | Attending: Neurosurgery | Admitting: Neurosurgery

## 2011-04-18 DIAGNOSIS — M545 Low back pain, unspecified: Secondary | ICD-10-CM | POA: Insufficient documentation

## 2011-04-18 DIAGNOSIS — M25569 Pain in unspecified knee: Secondary | ICD-10-CM | POA: Insufficient documentation

## 2011-04-18 DIAGNOSIS — Z96659 Presence of unspecified artificial knee joint: Secondary | ICD-10-CM | POA: Insufficient documentation

## 2011-04-18 DIAGNOSIS — G8929 Other chronic pain: Secondary | ICD-10-CM | POA: Insufficient documentation

## 2011-04-18 DIAGNOSIS — G589 Mononeuropathy, unspecified: Secondary | ICD-10-CM | POA: Insufficient documentation

## 2011-04-18 DIAGNOSIS — M5126 Other intervertebral disc displacement, lumbar region: Secondary | ICD-10-CM | POA: Insufficient documentation

## 2011-04-18 DIAGNOSIS — M76899 Other specified enthesopathies of unspecified lower limb, excluding foot: Secondary | ICD-10-CM | POA: Insufficient documentation

## 2011-04-18 DIAGNOSIS — G894 Chronic pain syndrome: Secondary | ICD-10-CM

## 2011-04-18 NOTE — Assessment & Plan Note (Signed)
HISTORY:  This is a patient of Dr. Lucia Estelle, seen for multiple chronic pain complaints regarding her left lower extremity.  She states her pain is unchanged.  She rates it 7.  General activity level is 7.  Pain same 24 hours a day.  Pain when she is walking, standing, and activity. Rest, heat, and medication tend to help.  She uses a cane for ambulation.  She does climb steps and walk about 10 minutes at a time. She is on disability and retired.  REVIEW OF SYSTEMS:  Notable for difficulties as described above as well as constipation, painful urination.  No suicidal thoughts or aberrant behaviors.  Pill counts are correct.  Last UDS was last month, not on the chart yet.  PAST MEDICAL HISTORY, SOCIAL HISTORY, AND FAMILY HISTORY:  Unchanged.  PHYSICAL EXAMINATION:  VITAL SIGNS:  Blood pressure 130/75, pulse 55, respirations 12, O2 sats 97 on room air. NEUROLOGIC:  Motor strength somewhat diminished in lower extremities. Sensation is intact. CONSTITUTIONAL:  She is obese.  She is alert and oriented x3.  She does have slight limp with using a single point cane.  ASSESSMENT: 1. Chronic left knee pain after a partial knee replacement with     complex regional pain syndrome. 2. Lumbago. 3. Disk protrusion at L3-4 affecting the left L3 nerve root. 4. Bilateral trochanter bursitis.  PLAN: 1. Refill MS Contin 50 mg ER 1 p.o. daily, #30 with no refill. 2. Morphine sulfate 15 mg IR 1 p.o. b.i.d. p.r.n., #60 with no refill.  Questions were encouraged and answered.  Followup with Dr. Lucia Estelle, next month.     Jilliane Kazanjian L. Blenda Nicely     Franchot Gallo, M.D. Electronically Signed   RLW/MedQ D:  04/18/2011 10:52:39  T:  04/18/2011 14:28:39  Job #:  [id number]

## 2011-05-16 ENCOUNTER — Encounter
Payer: Worker's Compensation | Attending: Physical Medicine and Rehabilitation | Admitting: Physical Medicine and Rehabilitation

## 2011-05-16 DIAGNOSIS — M545 Low back pain, unspecified: Secondary | ICD-10-CM | POA: Insufficient documentation

## 2011-05-16 DIAGNOSIS — G589 Mononeuropathy, unspecified: Secondary | ICD-10-CM | POA: Insufficient documentation

## 2011-05-16 DIAGNOSIS — G894 Chronic pain syndrome: Secondary | ICD-10-CM

## 2011-05-16 DIAGNOSIS — M79609 Pain in unspecified limb: Secondary | ICD-10-CM | POA: Insufficient documentation

## 2011-05-16 DIAGNOSIS — Z96659 Presence of unspecified artificial knee joint: Secondary | ICD-10-CM | POA: Insufficient documentation

## 2011-05-16 DIAGNOSIS — M76899 Other specified enthesopathies of unspecified lower limb, excluding foot: Secondary | ICD-10-CM | POA: Insufficient documentation

## 2011-05-16 DIAGNOSIS — M25569 Pain in unspecified knee: Secondary | ICD-10-CM

## 2011-05-17 NOTE — Assessment & Plan Note (Signed)
Paige Arnold is back in today for a recheck and refill of her pain medications.  She has a history of chronic left leg pain, which is multifactorial in nature.  She also has low back pain.  Her average pain is about 8 on a scale of 10.  Her left leg has been bothering her quite a bit lately.  She has limited walking, standing, capacity.  At this time, her average pain is about an 8 on a scale of 10.  Pain is worse with walking, bending, standing, and improves with rest, heat, or ice, medication.  She gets fair relief with current meds.  FUNCTIONAL STATUS:  She can walk limited amount of time.  She is able to climb stairs.  She does drive.  She does use a cane.  She is independent with self-care.  Denies problems controlling bowel or bladder.  Denies depression, anxiety, suicidal ideation.  REVIEW OF SYSTEMS:  Positive for constipation, otherwise contributory.  Past medical, social, family history are unchanged from previous visit.  PHYSICAL EXAMINATION:  Blood pressure 141/61, pulse 63, respirations 18, 93% saturated on room air.  She is a well-developed, obese woman who does not appear in any distress.  She is oriented x3.  Speech is clear. Affect is bright.  She is alert, cooperative, and pleasant.  Follows commands without difficulty.  Answers my questions appropriately. Cranial nerves and coordination are intact.  Her reflexes are 2+ at the right patellar tendon.  She declines allowing me to tap her left patellar tendon, 2+ at the right Achilles tendon, 0 at the left Achilles tendon.  She has some numbness in the dorsum of the right foot and somewhat in the left lower extremity as well.  Her motor strength, however, is relatively good in both lower extremities with hip flexion, knee extension, dorsiflexion, and plantar flexion.  She transitions from sitting to standing.  She has an antalgic gait. She has limitations in lumbar motion in all planes.  She has tenderness over the  left trochanteric bursa.  She has hypersensitivity with palpation around the left knee.  IMPRESSION:  Chronic left lower extremity pain, which is multifactorial in nature.  She has a history of complex regional pain syndrome, status post a partial knee replacement on the left per Dr. Percell Miller in 1999.  She may have an element of some sciatica at this point as well, and she has some trochanteric bursitis on the left with exquisite tenderness with palpation over this left hip.  She does has known disk protrusion at L3-4 impacting the left L3 root per lumbar MRI done back in November 2011.  Her medical problems include history of diabetes, hypothyroidism, depression, hypercholesterolemia, and hypertension.  PLAN:  We will refill her pain medications.  She has been taking them as prescribed without evidence of aberrant behavior.  Her last urine drug screen was done in December 2011 and was consistent.  I will refill her MS Contin 15 mg 1 p.o. b.i.d. #60 as well as morphine sulfate immediate release 15 mg 1 p.o. b.i.d. p.r.n. leg pain or back pain #60.  I will also start her on some Mobic 7.5 mg 1 p.o. daily for the next 2 weeks and I will refill her Topamax 50 mg 1 p.o. t.i.d. #90.  I have asked her to use some ice packs over her left trochanter and may consider Medrol Dosepak in the upcoming weeks if the Mobic is not helpful.  May consider re-imaging her low back since it has  been over a year now.  I asked her to monitor her lower extremities for swelling and watch her blood pressure while she is on the Mobic.  I have reviewed the risks and benefits of this medication with her, she would like to trial it to see if she can improve her low back and leg pain.     Franchot Gallo, M.D. Electronically Signed    DMK/MedQ D:  05/16/2011 14:33:19  T:  05/17/2011 02:56:12  Job #:  [id number]

## 2011-06-15 ENCOUNTER — Encounter
Payer: Worker's Compensation | Attending: Physical Medicine and Rehabilitation | Admitting: Physical Medicine and Rehabilitation

## 2011-06-15 DIAGNOSIS — M1710 Unilateral primary osteoarthritis, unspecified knee: Secondary | ICD-10-CM

## 2011-06-15 DIAGNOSIS — M79609 Pain in unspecified limb: Secondary | ICD-10-CM

## 2011-06-15 DIAGNOSIS — M76899 Other specified enthesopathies of unspecified lower limb, excluding foot: Secondary | ICD-10-CM | POA: Insufficient documentation

## 2011-06-15 DIAGNOSIS — G8929 Other chronic pain: Secondary | ICD-10-CM | POA: Insufficient documentation

## 2011-06-15 DIAGNOSIS — F329 Major depressive disorder, single episode, unspecified: Secondary | ICD-10-CM | POA: Insufficient documentation

## 2011-06-15 DIAGNOSIS — Z79899 Other long term (current) drug therapy: Secondary | ICD-10-CM | POA: Insufficient documentation

## 2011-06-15 DIAGNOSIS — E039 Hypothyroidism, unspecified: Secondary | ICD-10-CM | POA: Insufficient documentation

## 2011-06-15 DIAGNOSIS — G589 Mononeuropathy, unspecified: Secondary | ICD-10-CM | POA: Insufficient documentation

## 2011-06-15 DIAGNOSIS — L659 Nonscarring hair loss, unspecified: Secondary | ICD-10-CM | POA: Insufficient documentation

## 2011-06-15 DIAGNOSIS — I10 Essential (primary) hypertension: Secondary | ICD-10-CM | POA: Insufficient documentation

## 2011-06-15 DIAGNOSIS — E7800 Pure hypercholesterolemia, unspecified: Secondary | ICD-10-CM | POA: Insufficient documentation

## 2011-06-15 DIAGNOSIS — F3289 Other specified depressive episodes: Secondary | ICD-10-CM | POA: Insufficient documentation

## 2011-06-15 DIAGNOSIS — M545 Low back pain, unspecified: Secondary | ICD-10-CM

## 2011-06-15 DIAGNOSIS — E119 Type 2 diabetes mellitus without complications: Secondary | ICD-10-CM | POA: Insufficient documentation

## 2011-06-15 DIAGNOSIS — Z96659 Presence of unspecified artificial knee joint: Secondary | ICD-10-CM | POA: Insufficient documentation

## 2011-06-15 DIAGNOSIS — G894 Chronic pain syndrome: Secondary | ICD-10-CM

## 2011-06-16 NOTE — Assessment & Plan Note (Signed)
Paige Arnold is a pleasant 70 year old woman who [facility] for multiple chronic pain complaints.  At her last visit on May 16, 2011, she was having more problems at that time with some left leg pain.  She was started on some Mobic.  She tells me she took one dose and the next day she was doing much better.  Today, her average pain is about a 6 on a scale of 10.  Her leg pain is essentially resolved except for the knee problem.  She has had chronic pain, chronic problems with.  She reports sleeping well.  Her pain is typically when she is up walking.  She reports good relief with current medications.  No changes in functional status or mobility.  She is walking about 30 minutes at a time.  She is independent with self-care and denies any new problems regarding.  Review of systems, past medical, social and family history are unchanged, other then she reports hair loss over the last several months up to the point that she is requiring to wear she feels she needs to wear a hair piece.  She is requesting name of Endocrinology in the [place] area.  Medications prescribed through [facility] include morphine sulfate IR 15 mg b.i.d., MS Contin ER 15 mg once a day, Voltaren gel p.r.n., Lidoderm patch p.r.n., Topamax 50 mg 3 times a day and Mobic 7-day supply last month.  On exam today, her blood pressure is 146/70, pulse 60, respirations 14 and 96% saturated on room air.  She is 206 pounds, 5 foot, 5 inches tall.  She is oriented x3.  Does not appear in any distress.  She is alert, cooperative and pleasant.  Follows commands without difficulty. Answers my questions appropriately.  Cranial nerves and coordination are intact.  Her reflexes are 1+ at the patellar tendon on the right, diminished at bilateral Achilles tendon, defers left patellar tendon reflex.  She has good strength in both lower extremities.  Straight  leg raise is negative.  Transitions easily from sitting to standing.  Has a slightly antalgic gait.  Tenderness is unchanged around the left knee. No effusion is noted.  APML ability is intact.  IMPRESSION: 1. Chronic left lower extremity pain which was significant at last     visit, it is overall improved. 2. She has a history of complex regional pain syndrome and is status     post left partial knee replacement per Dr. Percell Miller 1999, this is     stable. 3. Resolution of sciatica on that side and improvement of trochanteric     bursitis on the left side as well. 4. She has multiple medical problems including diabetes mellitus,     hypothyroidism, depression, hypercholesterolemia and hypertension     and now recent hair loss.  I asked her to follow up with primary     care for this.  She is interested in following up with an     endocrinologist as well.  PLAN:  I will refill her morphine sulfate IR 15 mg, 1 p.o. b.i.d. #60. She will continue to take MS Contin 15 mg, once a day at 3 a.m. and she does not need refill on any other medications.  She takes her medications as prescribed.  No aberrant behavior has been observed with her.  Her last urine drug screen was done in December 2011 and was consistent.  She signed an opioid consent today as  well.  Answered all her questions.     Franchot Gallo, M.D. Electronically Signed   DMK/MedQ D:  06/15/2011 12:21:56  T:  06/16/2011 02:46:06  Job #:  [id number]

## 2011-07-11 ENCOUNTER — Encounter: Payer: Worker's Compensation | Attending: Neurosurgery | Admitting: Neurosurgery

## 2011-07-11 DIAGNOSIS — M545 Low back pain, unspecified: Secondary | ICD-10-CM | POA: Insufficient documentation

## 2011-07-11 DIAGNOSIS — M25569 Pain in unspecified knee: Secondary | ICD-10-CM | POA: Insufficient documentation

## 2011-07-11 DIAGNOSIS — G90529 Complex regional pain syndrome I of unspecified lower limb: Secondary | ICD-10-CM

## 2011-07-11 DIAGNOSIS — G589 Mononeuropathy, unspecified: Secondary | ICD-10-CM | POA: Insufficient documentation

## 2011-07-11 DIAGNOSIS — G894 Chronic pain syndrome: Secondary | ICD-10-CM

## 2011-07-11 DIAGNOSIS — G8929 Other chronic pain: Secondary | ICD-10-CM | POA: Insufficient documentation

## 2011-07-11 DIAGNOSIS — M79609 Pain in unspecified limb: Secondary | ICD-10-CM | POA: Insufficient documentation

## 2011-07-11 DIAGNOSIS — M5430 Sciatica, unspecified side: Secondary | ICD-10-CM | POA: Insufficient documentation

## 2011-07-12 NOTE — Assessment & Plan Note (Signed)
This is the patient of Dr. Lucia Estelle seen for low back and left knee pain.  She reports no change in her pain at a 7.  It is a sharp to dull stabbing, tingling, aching pain.  General activity level is 7-9.  Pain is worse during the day and evening. Walking, bending, standing aggravate.  Rest, heat, medication help.  She uses a cane to ambulate. She can climb steps.  Functionally she is retired.  REVIEW OF SYSTEMS:  Notable for difficulties described above as well as some constipation, painful urination.  No aberrant behavior, suicidal thoughts.  Last pill count, UDS consistent.  PAST MEDICAL HISTORY/SOCIAL HISTORY/FAMILY HISTORY:  Unchanged.  PHYSICAL EXAMINATION:  Blood pressure is 130/68, pulse 63, respirations 16, O2 sats 97 on room air.  Motor strength and sensation are intact. Constitutionally, she is within normal limits.  She is alert and oriented x3.  She has normal gait.  ASSESSMENT: 1. Chronic left lower extremity pain. 2. Complex regional pain syndrome of the left knee. 3. History of sciatica.  PLAN: 1. Refill Ms Contin CR 15 mg 1 p.o. daily at 3 p.m., #30 with no     refill. 2. Morphine sulfate 15 mg IR 1 p.o. b.i.d., #60 with no refill.  Her     questions were encouraged and answered.  She will see Dr.     Lucia Estelle next month.     Paige Arnold Electronically Signed    RLW/MedQ D:  07/11/2011 12:24:51  T:  07/12/2011 02:32:15  Job #:  [id number]

## 2011-08-13 ENCOUNTER — Encounter
Payer: Worker's Compensation | Attending: Physical Medicine and Rehabilitation | Admitting: Physical Medicine and Rehabilitation

## 2011-08-13 DIAGNOSIS — E119 Type 2 diabetes mellitus without complications: Secondary | ICD-10-CM | POA: Insufficient documentation

## 2011-08-13 DIAGNOSIS — G589 Mononeuropathy, unspecified: Secondary | ICD-10-CM | POA: Insufficient documentation

## 2011-08-13 DIAGNOSIS — M76899 Other specified enthesopathies of unspecified lower limb, excluding foot: Secondary | ICD-10-CM | POA: Insufficient documentation

## 2011-08-13 DIAGNOSIS — M545 Low back pain, unspecified: Secondary | ICD-10-CM

## 2011-08-13 DIAGNOSIS — M79609 Pain in unspecified limb: Secondary | ICD-10-CM | POA: Insufficient documentation

## 2011-08-13 DIAGNOSIS — E7800 Pure hypercholesterolemia, unspecified: Secondary | ICD-10-CM | POA: Insufficient documentation

## 2011-08-13 DIAGNOSIS — E039 Hypothyroidism, unspecified: Secondary | ICD-10-CM | POA: Insufficient documentation

## 2011-08-13 DIAGNOSIS — M2390 Unspecified internal derangement of unspecified knee: Secondary | ICD-10-CM

## 2011-08-13 DIAGNOSIS — G8929 Other chronic pain: Secondary | ICD-10-CM | POA: Insufficient documentation

## 2011-08-13 DIAGNOSIS — M5126 Other intervertebral disc displacement, lumbar region: Secondary | ICD-10-CM | POA: Insufficient documentation

## 2011-08-13 DIAGNOSIS — I10 Essential (primary) hypertension: Secondary | ICD-10-CM | POA: Insufficient documentation

## 2011-08-14 NOTE — Assessment & Plan Note (Signed)
HISTORY:  Paige Arnold is a 71 year old woman who is followed here at [facility] for chronic pain complaints which are related predominantly to her low back and left leg.  She is requesting refill of her Lidoderm, Topamax, and morphine today.  She reports average pain about 6-7 on a scale of 10 with medication, without medications she is about 9 or 10 on a scale of 10.  Her activity levels are moderately impacted by her pain.  Her pain is worse during the day and evening.  Pain is worse with activities, improves with rest and modalities as well as medication.  She reports good relief with meds.  FUNCTIONAL STATUS:  She can walk 30 minutes, she is able to climb stairs.  She does not drive.  She is independent with self-care.  REVIEW OF SYSTEMS:  Noncontributory.  PAST MEDICAL, SOCIAL, AND FAMILY HISTORY:  Notable for recent followup with Dr. Elyse Hsu, for thyroid problems.  PHYSICAL EXAMINATION:  VITAL SIGNS:  Today, her blood pressure is 136/50, pulse 62, respirations 16, 92% saturated on room air. GENERAL:  She is an obese woman who does not appear in any distress. She is oriented x3.  Speech is clear.  Affect is bright.  She is alert, cooperative, and pleasant.  Follows commands without difficulty. Answers my questions appropriately. CRANIAL NERVES:  Coordination are intact.  Reflexes are 1+ at the right patellar and diminished at the right Achilles tendon.  She defers reflex checking at the left knee and 0 at the left Achilles tendon.  Her motor strength is overall good, 5/5 at hip flexors, knee extensors, dorsiflexors, plantar flexors. Patchy sensory deficits around the left knee.  Straight leg raise is negative. Transitioning from sitting to standing is done without difficulty.  Gait is slightly antalgic.  Lumbar motion is mildly limited.  IMPRESSION: 1. Chronic left lower extremity pain with a history of complex     regional pain syndrome. 2.  History of partial left knee replacement, Dr. Percell Miller in 1999. 3. Intermittent trochanteric bursitis on the left. 4. History of left L3-4 disk protrusion impacting left L3 root may be     contributing to left lower extremity pain. 5. Her medical problems include diabetes mellitus, hypertension,     hypothyroidism, depression, hypercholesterolemia, and history of     kidney problems.  She sees Dr. Lorrene Reid. 6. We will refill her Lidoderm and her Topamax 50 mg 1 p.o. t.i.d.     #90.  I will refill her Paige Contin 15 mg, 1 p.o. daily, she has 16     pills left, I will give her 15 more.  Morphine sulfate IR 15 mg, 1     p.o. b.i.d. #60.  Her last urine drug screen was January 15, 2011 and     was consistent.  She is going for another urine drug screen today.     She takes her medications as prescribed without evidence of     aberrant behavior.  I will see her back in a month.     Franchot Gallo, M.D.    DMK/MedQ D:  08/13/2011 13:34:24  T:  08/14/2011 01:11:47  Job #:  [id number]

## 2011-09-12 ENCOUNTER — Encounter: Payer: Worker's Compensation | Attending: Physical Medicine and Rehabilitation | Admitting: *Deleted

## 2011-09-12 ENCOUNTER — Encounter: Payer: Self-pay | Admitting: *Deleted

## 2011-09-12 ENCOUNTER — Ambulatory Visit: Payer: Self-pay | Admitting: Physical Medicine and Rehabilitation

## 2011-09-12 VITALS — BP 141/66 | HR 70 | Resp 18 | Ht 66.0 in | Wt 209.0 lb

## 2011-09-12 DIAGNOSIS — E039 Hypothyroidism, unspecified: Secondary | ICD-10-CM | POA: Insufficient documentation

## 2011-09-12 DIAGNOSIS — G90529 Complex regional pain syndrome I of unspecified lower limb: Secondary | ICD-10-CM

## 2011-09-12 DIAGNOSIS — M545 Low back pain, unspecified: Secondary | ICD-10-CM | POA: Insufficient documentation

## 2011-09-12 DIAGNOSIS — Z79899 Other long term (current) drug therapy: Secondary | ICD-10-CM | POA: Insufficient documentation

## 2011-09-12 DIAGNOSIS — M76899 Other specified enthesopathies of unspecified lower limb, excluding foot: Secondary | ICD-10-CM | POA: Insufficient documentation

## 2011-09-12 DIAGNOSIS — Z96659 Presence of unspecified artificial knee joint: Secondary | ICD-10-CM | POA: Insufficient documentation

## 2011-09-12 DIAGNOSIS — M79609 Pain in unspecified limb: Secondary | ICD-10-CM | POA: Insufficient documentation

## 2011-09-12 DIAGNOSIS — I10 Essential (primary) hypertension: Secondary | ICD-10-CM | POA: Insufficient documentation

## 2011-09-12 DIAGNOSIS — E119 Type 2 diabetes mellitus without complications: Secondary | ICD-10-CM | POA: Insufficient documentation

## 2011-09-12 DIAGNOSIS — G8929 Other chronic pain: Secondary | ICD-10-CM | POA: Insufficient documentation

## 2011-09-12 DIAGNOSIS — G894 Chronic pain syndrome: Secondary | ICD-10-CM

## 2011-09-12 DIAGNOSIS — M5126 Other intervertebral disc displacement, lumbar region: Secondary | ICD-10-CM | POA: Insufficient documentation

## 2011-09-12 DIAGNOSIS — E7800 Pure hypercholesterolemia, unspecified: Secondary | ICD-10-CM | POA: Insufficient documentation

## 2011-09-12 MED ORDER — MORPHINE SULFATE CR 15 MG PO TB12: 15.0000 mg | ORAL_TABLET | Freq: Once | ORAL | Status: DC

## 2011-09-12 MED ORDER — MORPHINE SULFATE 15 MG PO TABS: 15.0000 mg | ORAL_TABLET | Freq: Two times a day (BID) | ORAL | Status: DC | PRN

## 2011-09-12 NOTE — Progress Notes (Signed)
Reports no change in pain complaint. No questions voiced.

## 2011-09-17 ENCOUNTER — Ambulatory Visit: Payer: Self-pay | Admitting: Physical Medicine and Rehabilitation

## 2011-10-08 ENCOUNTER — Encounter: Payer: Self-pay | Admitting: Physical Medicine and Rehabilitation

## 2011-10-08 ENCOUNTER — Encounter
Payer: Worker's Compensation | Attending: Physical Medicine and Rehabilitation | Admitting: Physical Medicine and Rehabilitation

## 2011-10-08 VITALS — BP 142/69 | HR 63 | Resp 18 | Ht 65.5 in | Wt 211.0 lb

## 2011-10-08 DIAGNOSIS — M5126 Other intervertebral disc displacement, lumbar region: Secondary | ICD-10-CM | POA: Insufficient documentation

## 2011-10-08 DIAGNOSIS — G589 Mononeuropathy, unspecified: Secondary | ICD-10-CM | POA: Insufficient documentation

## 2011-10-08 DIAGNOSIS — M7061 Trochanteric bursitis, right hip: Secondary | ICD-10-CM | POA: Insufficient documentation

## 2011-10-08 DIAGNOSIS — M76899 Other specified enthesopathies of unspecified lower limb, excluding foot: Secondary | ICD-10-CM | POA: Insufficient documentation

## 2011-10-08 DIAGNOSIS — M25569 Pain in unspecified knee: Secondary | ICD-10-CM

## 2011-10-08 DIAGNOSIS — M545 Low back pain, unspecified: Secondary | ICD-10-CM | POA: Insufficient documentation

## 2011-10-08 DIAGNOSIS — G8929 Other chronic pain: Secondary | ICD-10-CM | POA: Insufficient documentation

## 2011-10-08 DIAGNOSIS — E039 Hypothyroidism, unspecified: Secondary | ICD-10-CM | POA: Insufficient documentation

## 2011-10-08 DIAGNOSIS — E7800 Pure hypercholesterolemia, unspecified: Secondary | ICD-10-CM | POA: Insufficient documentation

## 2011-10-08 DIAGNOSIS — M7062 Trochanteric bursitis, left hip: Secondary | ICD-10-CM | POA: Insufficient documentation

## 2011-10-08 DIAGNOSIS — E119 Type 2 diabetes mellitus without complications: Secondary | ICD-10-CM | POA: Insufficient documentation

## 2011-10-08 DIAGNOSIS — M79609 Pain in unspecified limb: Secondary | ICD-10-CM | POA: Insufficient documentation

## 2011-10-08 DIAGNOSIS — I10 Essential (primary) hypertension: Secondary | ICD-10-CM | POA: Insufficient documentation

## 2011-10-08 MED ORDER — MORPHINE SULFATE CR 15 MG PO TB12: 15.0000 mg | ORAL_TABLET | Freq: Once | ORAL | Status: DC

## 2011-10-08 MED ORDER — MORPHINE SULFATE 15 MG PO TABS: 15.0000 mg | ORAL_TABLET | Freq: Two times a day (BID) | ORAL | Status: DC | PRN

## 2011-10-08 NOTE — Patient Instructions (Signed)
Followup with nursing staff over the next 2 months for pill count and refill of your pain medications. Make sure you bring your bottles so we can monitor dates.  I am very pleased that you are engaging in an exercise program which includes your glider 20 minutes each day.  I will see you back in 3 months.

## 2011-10-08 NOTE — Progress Notes (Signed)
Subjective:    Patient ID: Paige Arnold, female    DOB: 02/08/1941, 71 y.o.   MRN: [id number]   [name] is a pleasant 71 year old married woman who is [facility] for chronic pain  complaints related to her left lower extremity. She also has some  complaints of back pain and right lateral hip pain.  She has a far lateral disk protrusion at L3-4 impacting her left L3 root  per MRI which was done back in November 2011.  She also has a history of partial knee replacement per Dr. Percell Miller in  Q000111Q which was complicated by history of complex regional pain syndrome.  She is back in today.  Today her hips are not bothering her as much.  She had her right greater trochanter bursa injected back in May of 2012.   This helped her significantly for many months. She has been in a physical  therapy program as well.     Her average pain is 6 today. Sometimes it goes up to about an 8. Pain  is worse in the morning, daytime, and evening, not as bad at night.  Pain is worse with walking, bending, and standing. Improves with rest,  heat, ice, medication. She reports fair to good relief with current  Meds.  She tells me that she has been on an exercise glider for 20 minutes a day now for the last 3 months.     Pain Inventory Average Pain 5 Pain Right Now 8 My pain is sharp, dull and stabbing  In the last 24 hours, has pain interfered with the following? General activity 8 Relation with others 8 Enjoyment of life 9 What TIME of day is your pain at its worst? throughout the day and evening Sleep (in general) Fair  Pain is worse with: walking, bending, standing and some activites Pain improves with: rest, heat/ice and medication Relief from Meds: 7   MOBILITY   Can walk 10 min use a cane ability to climb steps?  yes do you drive?  no  Function retired I need assistance with the following:  meal prep, household duties and  shopping  Neuro/Psych No problems in this area  Prior Studies Any changes since last visit?  yes CT/MRI  Of breast after mammogram; neg biopsy  Physicians involved in your care Any changes since last visit?  no Dr Altheimer for thyroid      HPI    Review of Systems  Constitutional: Positive for unexpected weight change (weight gain).  HENT: Negative.   Eyes: Negative.   Respiratory: Negative.   Cardiovascular: Negative.   Gastrointestinal: Positive for constipation.  Genitourinary: Positive for dysuria (chronic cystitis; burning w/ urination).  Musculoskeletal: Positive for myalgias, back pain, arthralgias and gait problem.  Skin: Negative.   Neurological: Negative.   Hematological: Negative.   Psychiatric/Behavioral: Negative.        Objective:   Physical Exam  The patient is alert oriented cooperative pleasant post mental difficulty answers questions appropriately  Cranial nerves are grossly intact coordination is intact  Manual muscle testing of upper and lower extremities reveals 5 over 5 strength.  Sensory exam she reports decreased sensation around the area of the left knee mostly anteriorly.  Gait stable   Minimal Difficulty with tandem gait  Decreased range of motion lumbar spine only in extension  Full range of motion and knees without effusion, hypersensitive around left knee which is not new.  Straight  leg raise is negative in bilateral lower extremities.  Minimal tenderness along trochanter and Illiotibial band today  Terminal and external rotation at the hips does not provoke groin or posterior hip pain.     Assessment & Plan:  IMPRESSION:  1. Chronic left knee pain status post partial knee replacement on the  left complicated by a complex regional pain syndrome, Dr. Percell Miller in  [place] with known lumbar spondylosis.  3. History of bilateral disk protrusion at L3-4 impacting left L3  root.  4. Bilateral trochanteric  bursitis, left greater than right. The  patient is status post a right trochanter injection back in May  2012, this helped her.  Plan:  The Opioid medication pill count was appropriate.  No reported significant side effects of Opioid medications noted.  No aberrant behavior noted.  Patient cautioned regarding operation of machinery or vehicles.  Patient understands risk and benefits of these medications.  There is no indication or report of risk to self or others.   The patient will followup over the next 2 months with nursing staff for pill count and refill of her pain medications.  Her medical problems include diabetes mellitus, hypertension,  hypothyroidism, depression, hypercholesterolemia, and history of  kidney problems. She sees Dr. Lorrene Reid.

## 2011-11-09 ENCOUNTER — Encounter
Payer: Worker's Compensation | Attending: Physical Medicine and Rehabilitation | Admitting: Physical Medicine and Rehabilitation

## 2011-11-09 ENCOUNTER — Encounter: Payer: Self-pay | Admitting: Physical Medicine and Rehabilitation

## 2011-11-09 VITALS — BP 144/65 | HR 58 | Resp 16 | Ht 66.0 in | Wt 213.0 lb

## 2011-11-09 DIAGNOSIS — M545 Low back pain, unspecified: Secondary | ICD-10-CM | POA: Insufficient documentation

## 2011-11-09 DIAGNOSIS — M7062 Trochanteric bursitis, left hip: Secondary | ICD-10-CM

## 2011-11-09 DIAGNOSIS — M79609 Pain in unspecified limb: Secondary | ICD-10-CM | POA: Insufficient documentation

## 2011-11-09 DIAGNOSIS — E7800 Pure hypercholesterolemia, unspecified: Secondary | ICD-10-CM | POA: Insufficient documentation

## 2011-11-09 DIAGNOSIS — M25569 Pain in unspecified knee: Secondary | ICD-10-CM

## 2011-11-09 DIAGNOSIS — M5126 Other intervertebral disc displacement, lumbar region: Secondary | ICD-10-CM | POA: Insufficient documentation

## 2011-11-09 DIAGNOSIS — E119 Type 2 diabetes mellitus without complications: Secondary | ICD-10-CM | POA: Insufficient documentation

## 2011-11-09 DIAGNOSIS — M76899 Other specified enthesopathies of unspecified lower limb, excluding foot: Secondary | ICD-10-CM | POA: Insufficient documentation

## 2011-11-09 DIAGNOSIS — G589 Mononeuropathy, unspecified: Secondary | ICD-10-CM | POA: Insufficient documentation

## 2011-11-09 DIAGNOSIS — E039 Hypothyroidism, unspecified: Secondary | ICD-10-CM | POA: Insufficient documentation

## 2011-11-09 DIAGNOSIS — G8929 Other chronic pain: Secondary | ICD-10-CM | POA: Insufficient documentation

## 2011-11-09 DIAGNOSIS — I10 Essential (primary) hypertension: Secondary | ICD-10-CM | POA: Insufficient documentation

## 2011-11-09 MED ORDER — MORPHINE SULFATE 15 MG PO TABS: 15.0000 mg | ORAL_TABLET | Freq: Two times a day (BID) | ORAL | Status: DC | PRN

## 2011-11-09 MED ORDER — MORPHINE SULFATE CR 15 MG PO TB12: 15.0000 mg | ORAL_TABLET | Freq: Once | ORAL | Status: DC

## 2011-11-09 NOTE — Progress Notes (Signed)
Subjective:    Patient ID: Paige Arnold, female    DOB: [birth year], 71 y.o.   MRN: [id number]  HPI  Paige Arnold is a pleasant 71 year old married woman who is followed  here at [facility] for chronic pain  complaints related to her left lower extremity. She also has some  complaints of back pain and right lateral hip pain.  She has a far lateral disk protrusion at L3-4 impacting her left L3 root  per MRI which was done back in November 2011.  She also has a history of partial knee replacement per Dr. Percell Miller in  Q000111Q which was complicated by history of complex regional pain syndrome.  She is back in today.  Today her hips are not bothering her as much.  She had her right greater trochanter bursa injected back in May of 2012.  This helped her significantly for many months. She has been in a physical  therapy program as well.  Her average pain is 6 today. Sometimes it goes up to about an 8. Pain  is worse in the morning, daytime, and evening, not as bad at night.  Pain is worse with walking, bending, and standing. Improves with rest,  heat, ice, medication. She reports fair to good relief with current  Meds.  She tells me that she has been on an exercise glider for 20 minutes a day now for the last 3 months.    Pain Inventory Average Pain 5 Pain Right Now 5 My pain is sharp, dull and stabbing  In the last 24 hours, has pain interfered with the following? General activity 7 Relation with others 8 Enjoyment of life 9 What TIME of day is your pain at its worst? Morning, Daytime and Evening Sleep (in general) Fair  Pain is worse with: walking, bending, standing and some activites Pain improves with: rest, heat/ice and medication Relief from Meds: 9  Mobility use a cane transfers alone  Function retired I need assistance with the following:  household duties  Neuro/Psych No problems in this area  Prior Studies Any changes since last  visit?  no  Physicians involved in your care Any changes since last visit?  no  Review of Systems  Constitutional: Negative.   HENT: Negative.   Eyes: Negative.   Respiratory: Negative.   Cardiovascular: Negative.   Gastrointestinal: Positive for constipation.  Genitourinary: Negative.   Musculoskeletal: Negative.   Skin: Negative.   Neurological: Negative.   Hematological: Negative.   Psychiatric/Behavioral: Negative.        Objective:   Physical Exam The patient is alert oriented cooperative pleasant ,affect is bright alert, answers questions appropriately.  Cranial nerves are grossly intact coordination is intact  Manual muscle testing of upper and lower extremities reveals 5 over 5 strength.  Sensory exam she reports decreased sensation around the area of the left knee mostly anteriorly.  Gait stable  Minimal Difficulty with tandem gait  Decreased range of motion lumbar spine only in extension  Full range of motion and knees without effusion, hypersensitive around left knee which is not new.  Straight leg raise is negative in bilateral lower extremities.  Minimal tenderness along trochanter and Illiotibial band today  Terminal and external rotation at the hips does not provoke groin or posterior hip pain.         Assessment & Plan:  1. Chronic left knee pain status post partial knee replacement on the  left complicated by a complex regional  pain syndrome, Dr. Percell Miller in  [place] with known lumbar spondylosis.  3. History of bilateral disk protrusion at L3-4 impacting left L3  root.  4. Bilateral trochanteric bursitis, left greater than right. The  patient is status post a right trochanter injection back in May  2012, this helped her.   Bursitis has been much better since she started exercising with the glider.   Plan: The Opioid medication pill count was appropriate. No reported significant side effects of Opioid medications noted. No aberrant  behavior noted. Patient cautioned regarding operation of machinery or vehicles. Patient understands risk and benefits of these medications. There is no indication or report of risk to self or others.  The patient will followup over the next 2 months with nursing staff for pill count and refill of her pain medications.  Her medical problems include diabetes mellitus, hypertension,  hypothyroidism, depression, hypercholesterolemia, and history of  kidney problems. She sees Dr. Lorrene Reid.     Pt is still using glider for 20 min a day, says she misses it if she does not do it each day.

## 2011-11-09 NOTE — Patient Instructions (Signed)
Followup with nursing staff over the next 2 months.  I'll see her back in 3 months  Please make sure you keep your pain medications locked off and in a secure location.  I am very impressed you are continuing to use your glider for exercise. I am encouraging you to continued to do so.

## 2011-11-23 ENCOUNTER — Other Ambulatory Visit: Payer: Self-pay | Admitting: Physical Medicine and Rehabilitation

## 2011-12-06 ENCOUNTER — Encounter
Payer: Worker's Compensation | Attending: Physical Medicine and Rehabilitation | Admitting: Physical Medicine and Rehabilitation

## 2011-12-06 ENCOUNTER — Encounter: Payer: Self-pay | Admitting: Physical Medicine and Rehabilitation

## 2011-12-06 VITALS — BP 146/58 | HR 57 | Resp 16 | Ht 66.0 in | Wt 207.0 lb

## 2011-12-06 DIAGNOSIS — M25562 Pain in left knee: Secondary | ICD-10-CM

## 2011-12-06 DIAGNOSIS — G8929 Other chronic pain: Secondary | ICD-10-CM | POA: Insufficient documentation

## 2011-12-06 DIAGNOSIS — Z96659 Presence of unspecified artificial knee joint: Secondary | ICD-10-CM | POA: Insufficient documentation

## 2011-12-06 DIAGNOSIS — M25569 Pain in unspecified knee: Secondary | ICD-10-CM

## 2011-12-06 DIAGNOSIS — M545 Low back pain, unspecified: Secondary | ICD-10-CM | POA: Insufficient documentation

## 2011-12-06 DIAGNOSIS — M25519 Pain in unspecified shoulder: Secondary | ICD-10-CM | POA: Insufficient documentation

## 2011-12-06 MED ORDER — MORPHINE SULFATE 15 MG PO TABS: 15.0000 mg | ORAL_TABLET | Freq: Two times a day (BID) | ORAL | Status: DC | PRN

## 2011-12-06 MED ORDER — MORPHINE SULFATE ER 15 MG PO TBCR: 15.0000 mg | EXTENDED_RELEASE_TABLET | ORAL | Status: DC

## 2011-12-06 NOTE — Progress Notes (Signed)
Subjective:    Patient ID: Paige Arnold, female    DOB: 12-02-1940, 71 y.o.   MRN: [id number]  HPI The patient is a year old female female, who presents with . The symptoms started ago. The patient complains about severe moderate pain in at  , which radiate to. Patient denies any radiating symptoms. Patient also complains about numbness and tingling in  . He She describes the pain as   . Applying heat, taking medications , positions alleviate the symptoms. Prolonged   aggrevates the symptoms. The patient grades his pain as a  /10.  Pain Inventory Average Pain 7 Pain Right Now 6 My pain is constant, dull and aching  In the last 24 hours, has pain interfered with the following? General activity 7 Relation with others 7 Enjoyment of life 9 What TIME of day is your pain at its worst? Morning and Daytime Sleep (in general) Good  Pain is worse with: walking, standing and some activites Pain improves with: rest and heat/ice Relief from Meds: 8  Mobility use a cane how many minutes can you walk? 10 ability to climb steps?  yes do you drive?  no  Function retired  Neuro/Psych No problems in this area  Prior Studies Any changes since last visit?  no  Physicians involved in your care Any changes since last visit?  no   Family History  Problem Relation Age of Onset  . Kidney disease Mother   . Heart disease Father   . Diabetes Brother   . Alcohol abuse Brother   . Depression Brother   . Sarcoidosis Brother   . ALS Brother   . Heart disease Brother    History   Social History  . Marital Status: Married    Spouse Name: N/A    Number of Children: N/A  . Years of Education: N/A   Social History Main Topics  . Smoking status: Never Smoker   . Smokeless tobacco: None  . Alcohol Use: No  . Drug Use: No  . Sexually Active: None   Other Topics Concern  . None   Social History Narrative  . None   Past Surgical History  Procedure Date  . Tubal ligation   .  Temporomandibular joint surgery   . Partial knee arthroplasty    Past Medical History  Diagnosis Date  . Thyroid disorder   . Kidney disease   . Arthritis   . Hypertension   . Diabetes mellitus    BP 146/58  Pulse 57  Resp 16  Ht 5\' 6"  (1.676 m)  Wt 207 lb (93.895 kg)  BMI 33.41 kg/m2  SpO2 95%     Review of Systems  Constitutional: Positive for unexpected weight change.  HENT: Negative.   Eyes: Negative.   Respiratory: Negative.   Cardiovascular: Negative.   Gastrointestinal: Negative.   Genitourinary: Negative.   Musculoskeletal: Negative.   Skin: Negative.   Neurological: Negative.   Hematological: Negative.   Psychiatric/Behavioral: Negative.        Objective:   Physical Exam  Constitutional: She is oriented to person, place, and time. She appears well-developed.  HENT:  Head: Normocephalic.  Neck: Normal range of motion.  Neurological: She is alert and oriented to person, place, and time.  Skin: Skin is warm and dry.  Psychiatric: She has a normal mood and affect.   Symmetric normal motor tone is noted throughout. Normal muscle bulk. Muscle testing reveals 5/5 muscle strength of the upper extremity, and 5/5 of  the lower extremity, except left tibialis anterior 4-/5. Full range of motion in upper and lower extremities. ROM of spine is  restricted.   DTR in the upper and lower extremity are present and symmetric 2+, except could not elicit left patella tendon reflex, because of pain. No clonus is noted.  Patient arises from chair with some difficulty. Wide based gait with cane.  Walking on heels and toes, not tested because of safety reasons . Very tender to touch at left anterior knee.         Assessment & Plan:  Chronic low back pain , MR I showed left lateral disc protrusion at L3. Chronic left knee pain, status post partial knee replacement on the left. Advised patient to continue with her exercises and her walking program. Also advised patient to  try to use the pool of her daughter for exercising and walking in the water, if she feels safe to do that and somebody else is with her. She should also continue medication regimen.

## 2011-12-06 NOTE — Patient Instructions (Signed)
Continue your exercising program, continue walking program. Continue your medication. Advised patient to try to walk in her daughters pool, if it is save for her to get in and out.

## 2012-01-07 ENCOUNTER — Encounter: Payer: Self-pay | Admitting: Physical Medicine and Rehabilitation

## 2012-01-07 ENCOUNTER — Encounter
Payer: Worker's Compensation | Attending: Physical Medicine and Rehabilitation | Admitting: Physical Medicine and Rehabilitation

## 2012-01-07 VITALS — BP 149/75 | HR 60 | Resp 14 | Ht 66.0 in | Wt 211.0 lb

## 2012-01-07 DIAGNOSIS — M79605 Pain in left leg: Secondary | ICD-10-CM

## 2012-01-07 DIAGNOSIS — Z96659 Presence of unspecified artificial knee joint: Secondary | ICD-10-CM | POA: Insufficient documentation

## 2012-01-07 DIAGNOSIS — R209 Unspecified disturbances of skin sensation: Secondary | ICD-10-CM | POA: Insufficient documentation

## 2012-01-07 DIAGNOSIS — M545 Low back pain, unspecified: Secondary | ICD-10-CM | POA: Insufficient documentation

## 2012-01-07 DIAGNOSIS — M25562 Pain in left knee: Secondary | ICD-10-CM

## 2012-01-07 DIAGNOSIS — M25569 Pain in unspecified knee: Secondary | ICD-10-CM | POA: Insufficient documentation

## 2012-01-07 DIAGNOSIS — G8929 Other chronic pain: Secondary | ICD-10-CM | POA: Insufficient documentation

## 2012-01-07 MED ORDER — MORPHINE SULFATE ER 15 MG PO TBCR: 15.0000 mg | EXTENDED_RELEASE_TABLET | ORAL | Status: DC

## 2012-01-07 MED ORDER — MORPHINE SULFATE 15 MG PO TABS: 15.0000 mg | ORAL_TABLET | Freq: Two times a day (BID) | ORAL | Status: DC | PRN

## 2012-01-07 NOTE — Progress Notes (Signed)
Subjective:    Patient ID: Paige Arnold, female    DOB: [birth year], 71 y.o.   MRN: [id number]  HPI The patient is a year old female female, who presents with low back and left knee pain . The symptoms started over 13 years  ago. The patient complains about moderate  pain   , which radiate to the left lateral lower extremity down to the toes, intermittendly. Patient also complains about numbness and tingling in the same distribution  .  Applying heat, taking medications , changing positions alleviate the symptoms. Prolonged standing aggrevates the symptoms. The patient grades his pain as a 6 /10. Pain Inventory Average Pain 6 Pain Right Now 6 My pain is intermittent  In the last 24 hours, has pain interfered with the following? General activity 7 Relation with others 8 Enjoyment of life 9 What TIME of day is your pain at its worst? morning day and evening Sleep (in general) Poor  Pain is worse with: walking, bending, standing and some activites Pain improves with: rest, heat/ice and medication Relief from Meds: 5  Mobility use a cane ability to climb steps?  yes do you drive?  no  Function retired  Neuro/Psych No problems in this area  Prior Studies Any changes since last visit?  no  Physicians involved in your care Any changes since last visit?  no   Family History  Problem Relation Age of Onset  . Kidney disease Mother   . Heart disease Father   . Diabetes Brother   . Alcohol abuse Brother   . Depression Brother   . Sarcoidosis Brother   . ALS Brother   . Heart disease Brother    History   Social History  . Marital Status: Married    Spouse Name: N/A    Number of Children: N/A  . Years of Education: N/A   Social History Main Topics  . Smoking status: Never Smoker   . Smokeless tobacco: None  . Alcohol Use: No  . Drug Use: No  . Sexually Active: None   Other Topics Concern  . None   Social History Narrative  . None   Past Surgical History    Procedure Date  . Tubal ligation   . Temporomandibular joint surgery   . Partial knee arthroplasty    Past Medical History  Diagnosis Date  . Thyroid disorder   . Kidney disease   . Arthritis   . Hypertension   . Diabetes mellitus    BP 149/75  Pulse 60  Resp 14  Ht 5\' 6"  (1.676 m)  Wt 211 lb (95.709 kg)  BMI 34.06 kg/m2  SpO2 95%     Review of Systems  Constitutional: Positive for unexpected weight change.  Gastrointestinal: Positive for constipation.  Musculoskeletal: Positive for back pain and arthralgias.       Objective:   Physical Exam Head: Normocephalic.  Neck: Normal range of motion.  Neurological: She is alert and oriented to person, place, and time.  Skin: Skin is warm and dry.  Psychiatric: She has a normal mood and affect.   Symmetric normal motor tone is noted throughout. Normal muscle bulk. Muscle testing reveals 5/5 muscle strength of the upper extremity, and 5/5 of the lower extremity, except left tibialis anterior 4-/5. Full range of motion in upper and lower extremities. ROM of spine is restricted.  DTR in the upper and lower extremity are present and symmetric 2+, except could not elicit left patella tendon reflex, because of  pain. No clonus is noted.  Patient arises from chair with some difficulty. Wide based gait with cane. Walking on heels and toes, not tested because of safety reasons . Very tender to touch at left anterior knee.         Assessment & Plan:  Chronic low back pain , MR I showed left lateral disc protrusion at L3.  Chronic left knee pain, status post partial knee replacement on the left.  Advised patient to continue with her exercises and her walking program. Also advised patient to try to use the pool of her daughter for exercising and walking in the water, if she feels safe to do that and somebody else is with her. She should also continue medication regimen.

## 2012-01-07 NOTE — Patient Instructions (Signed)
Try to walk and do exercises in the pool, continue with glider exercising for 20 min /day. Continue with medication.

## 2012-02-06 ENCOUNTER — Encounter
Payer: Worker's Compensation | Attending: Physical Medicine and Rehabilitation | Admitting: Physical Medicine and Rehabilitation

## 2012-02-06 ENCOUNTER — Encounter: Payer: Self-pay | Admitting: Physical Medicine and Rehabilitation

## 2012-02-06 VITALS — BP 142/53 | HR 54 | Resp 14 | Ht 66.0 in | Wt 210.0 lb

## 2012-02-06 DIAGNOSIS — M25569 Pain in unspecified knee: Secondary | ICD-10-CM | POA: Insufficient documentation

## 2012-02-06 DIAGNOSIS — M7062 Trochanteric bursitis, left hip: Secondary | ICD-10-CM

## 2012-02-06 DIAGNOSIS — G8929 Other chronic pain: Secondary | ICD-10-CM | POA: Insufficient documentation

## 2012-02-06 DIAGNOSIS — M545 Low back pain, unspecified: Secondary | ICD-10-CM | POA: Insufficient documentation

## 2012-02-06 DIAGNOSIS — M76899 Other specified enthesopathies of unspecified lower limb, excluding foot: Secondary | ICD-10-CM

## 2012-02-06 MED ORDER — MORPHINE SULFATE 15 MG PO TABS: 15.0000 mg | ORAL_TABLET | Freq: Two times a day (BID) | ORAL | Status: DC | PRN

## 2012-02-06 MED ORDER — MORPHINE SULFATE 15 MG PO TABS: 15.0000 mg | ORAL_TABLET | Freq: Two times a day (BID) | ORAL | Status: AC

## 2012-02-06 MED ORDER — MORPHINE SULFATE ER 15 MG PO TBCR: 15.0000 mg | EXTENDED_RELEASE_TABLET | Freq: Once | ORAL | Status: DC

## 2012-02-06 NOTE — Patient Instructions (Signed)
Please keep your pain medications locked up and in a secure location.  Nurse practitioner will see you in August and September for a refill of your pain medications.  Paige Arnold will see you in 3 months.

## 2012-02-06 NOTE — Progress Notes (Addendum)
Subjective:    Patient ID: Paige Arnold, female    DOB: [birth year], 71 y.o.   MRN: [id number]  HPI  [name] is a pleasant 71 year old married woman who is followed  here at [facility] for chronic pain  complaints related to her left lower extremity. She also has some  complaints of back pain and right lateral hip pain.  She has a far lateral disk protrusion at L3-4 impacting her left L3 root  per MRI which was done back in November 2011.  She also has a history of partial knee replacement per Dr. Percell Miller in  [id number] which was complicated by history of complex regional pain syndrome.   Today her hips are not bothering her as much.  She had her right greater trochanter bursa injected back in May of 2012.  This helped her significantly for many months. She has been in a physical  therapy program as well.  Her average pain is 5 today. Sometimes it goes up to about an 8. Pain  is worse in the morning, daytime, and evening, not as bad at night.  Pain is worse with walking, bending, and standing. Improves with rest,  heat, ice, medication. She reports fair to good relief with current  Meds.  She tells me that she has been on an exercise glider for 20 minutes a day now for the last 3 months.  Her low back is bothering today. She attributes this to using her glider without her lumbar support.    She is here for a refill on her pain medications   Pain Inventory Average Pain 5 Pain Right Now 5 My pain is sharp, dull and stabbing  In the last 24 hours, has pain interfered with the following? General activity 8 Relation with others 8 Enjoyment of life 9 What TIME of day is your pain at its worst? morning, day and evening Sleep (in general) Fair  Pain is worse with: walking, bending, standing and some activites Pain improves with: rest, heat/ice and medication Relief from Meds: 6  Mobility use a cane ability to climb steps?  yes do you drive?   no  Function disabled: date disabled 1999 I need assistance with the following:  household duties  Neuro/Psych No problems in this area  Prior Studies Any changes since last visit?  no  Physicians involved in your care Any changes since last visit?  no   Family History  Problem Relation Age of Onset  . Kidney disease Mother   . Heart disease Father   . Diabetes Brother   . Alcohol abuse Brother   . Depression Brother   . Sarcoidosis Brother   . ALS Brother   . Heart disease Brother    History   Social History  . Marital Status: Married    Spouse Name: N/A    Number of Children: N/A  . Years of Education: N/A   Social History Main Topics  . Smoking status: Never Smoker   . Smokeless tobacco: None  . Alcohol Use: No  . Drug Use: No  . Sexually Active: None   Other Topics Concern  . None   Social History Narrative  . None   Past Surgical History  Procedure Date  . Tubal ligation   . Temporomandibular joint surgery   . Partial knee arthroplasty    Past Medical History  Diagnosis Date  . Thyroid disorder   . Kidney disease   . Arthritis   .  Hypertension   . Diabetes mellitus    BP 142/53  Pulse 54  Resp 14  Ht 5\' 6"  (1.676 m)  Wt 210 lb (95.255 kg)  BMI 33.89 kg/m2  SpO2 96%     Review of Systems  Gastrointestinal: Positive for constipation.  Genitourinary: Positive for difficulty urinating.  Musculoskeletal: Positive for back pain and gait problem.  All other systems reviewed and are negative.       Objective:   Physical Exam The patient is alert oriented cooperative pleasant ,affect is bright alert, answers questions appropriately.  Cranial nerves are grossly intact coordination is intact  Manual muscle testing of upper and lower extremities reveals 5 over 5 strength.  Sensory exam she reports decreased sensation around the area of the left knee mostly anteriorly.  Gait stable  Minimal Difficulty with tandem gait  Decreased range  of motion lumbar spine only in extension  Full range of motion and knees without effusion, hypersensitive around left knee which is not new.  Straight leg raise is negative in bilateral lower extremities.  Minimal tenderness along trochanter and Illiotibial band today  Terminal and external rotation at the hips does not provoke groin or posterior hip pain.         Assessment & Plan:  1. Chronic left knee pain status post partial knee replacement on the  left complicated by a complex regional pain syndrome, Dr. Percell Miller in  [place] with known lumbar spondylosis.   3. History of bilateral disk protrusion at L3-4 impacting left L3  root.    4. Bilateral trochanteric bursitis, left greater than right. The  patient is status post a right trochanter injection back in May  2012, this helped her.    Bursitis has been much better since she started exercising with the glider.   Recommend to use lumbar support while on glider.  Plan:  Continue immediate release MORPHINE 15 mg at 10 AM and at 10 PM. (Twice a day)   MS Contin at 3 PM. ( once a day )    The Opioid medication pill count was appropriate. No reported significant side effects of Opioid medications noted. No aberrant behavior noted. Patient cautioned regarding operation of machinery or vehicles. Patient understands risk and benefits of these medications. There is no indication or report of risk to self or others.      The patient will followup over the next 2 months with nursing staff for pill count and refill of her pain medications.     Her medical problems include diabetes mellitus, hypertension,  hypothyroidism, depression, hypercholesterolemia, and history of  kidney problems. She sees Dr. Lorrene Reid.    Pt is still using glider for 20 min a day, says she misses it if she does not do it each day.  Physical medicine and rehabilitation  goals include maintaining or improving physical function and  minimizing oral pain medications.   She also mentioned she has 4 tablets of a non steroidal anti-inflammatory medication left at home which she may take over the next day or 2.  She will call us if you think she needs more.

## 2012-03-10 ENCOUNTER — Encounter: Payer: Self-pay | Admitting: Physical Medicine and Rehabilitation

## 2012-03-10 ENCOUNTER — Encounter
Payer: Worker's Compensation | Attending: Physical Medicine and Rehabilitation | Admitting: Physical Medicine and Rehabilitation

## 2012-03-10 VITALS — BP 140/78 | HR 60 | Resp 14 | Ht 66.0 in | Wt 213.0 lb

## 2012-03-10 DIAGNOSIS — Z96659 Presence of unspecified artificial knee joint: Secondary | ICD-10-CM | POA: Insufficient documentation

## 2012-03-10 DIAGNOSIS — M5126 Other intervertebral disc displacement, lumbar region: Secondary | ICD-10-CM | POA: Insufficient documentation

## 2012-03-10 DIAGNOSIS — M545 Low back pain, unspecified: Secondary | ICD-10-CM

## 2012-03-10 DIAGNOSIS — M25562 Pain in left knee: Secondary | ICD-10-CM

## 2012-03-10 DIAGNOSIS — G8929 Other chronic pain: Secondary | ICD-10-CM | POA: Insufficient documentation

## 2012-03-10 DIAGNOSIS — E119 Type 2 diabetes mellitus without complications: Secondary | ICD-10-CM | POA: Insufficient documentation

## 2012-03-10 DIAGNOSIS — I10 Essential (primary) hypertension: Secondary | ICD-10-CM | POA: Insufficient documentation

## 2012-03-10 DIAGNOSIS — M25569 Pain in unspecified knee: Secondary | ICD-10-CM | POA: Insufficient documentation

## 2012-03-10 MED ORDER — MORPHINE SULFATE ER 15 MG PO TBCR
15.0000 mg | EXTENDED_RELEASE_TABLET | Freq: Once | ORAL | Status: DC
Start: 2012-03-10 — End: 2012-04-04

## 2012-03-10 MED ORDER — MORPHINE SULFATE 15 MG PO TABS: 15.0000 mg | ORAL_TABLET | Freq: Two times a day (BID) | ORAL | Status: DC

## 2012-03-10 NOTE — Progress Notes (Signed)
Subjective:    Patient ID: Paige Arnold, female    DOB: 19-Aug-1940, 71 y.o.   MRN: [id number]  HPI The patient is a 71 year old female female, who presents with low back and left knee pain . The symptoms started over 13 years ago. The patient complains about moderate pain , which radiate to the left lateral lower extremity down to the toes, intermittendly. Patient also complains about numbness and tingling in the same distribution . Applying heat, taking medications , changing positions alleviate the symptoms. Prolonged standing aggrevates the symptoms. The patient grades her pain as a 6 /10.  Pain Inventory Average Pain 8 Pain Right Now 6 My pain is intermittent, dull and aching  In the last 24 hours, has pain interfered with the following? General activity 7 Relation with others 7 Enjoyment of life 9 What TIME of day is your pain at its worst? morning day and night Sleep (in general) Fair  Pain is worse with: walking, bending, standing and some activites Pain improves with: rest, heat/ice and medication Relief from Meds: 5  Mobility walk with assistance use a cane how many minutes can you walk? 5-10 ability to climb steps?  yes do you drive?  no  Function disabled: date disabled 1999 I need assistance with the following:  household duties  Neuro/Psych bladder control problems weakness trouble walking  Prior Studies Any changes since last visit?  no  Physicians involved in your care Any changes since last visit?  no   Family History  Problem Relation Age of Onset  . Kidney disease Mother   . Heart disease Father   . Diabetes Brother   . Alcohol abuse Brother   . Depression Brother   . Sarcoidosis Brother   . ALS Brother   . Heart disease Brother    History   Social History  . Marital Status: Married    Spouse Name: N/A    Number of Children: N/A  . Years of Education: N/A   Social History Main Topics  . Smoking status: Never Smoker   . Smokeless  tobacco: None  . Alcohol Use: No  . Drug Use: No  . Sexually Active: None   Other Topics Concern  . None   Social History Narrative  . None   Past Surgical History  Procedure Date  . Tubal ligation   . Temporomandibular joint surgery   . Partial knee arthroplasty    Past Medical History  Diagnosis Date  . Thyroid disorder   . Kidney disease   . Arthritis   . Hypertension   . Diabetes mellitus    BP 140/78  Pulse 60  Resp 14  Ht 5\' 6"  (1.676 m)  Wt 213 lb (96.616 kg)  BMI 34.38 kg/m2     Review of Systems  Constitutional: Positive for unexpected weight change.  Gastrointestinal: Positive for constipation.  Genitourinary: Positive for difficulty urinating.  Musculoskeletal: Positive for myalgias, back pain and arthralgias.  All other systems reviewed and are negative.       Objective:   Physical Exam Head: Normocephalic.  Neck: Normal range of motion.  Neurological: She is alert and oriented to person, place, and time.  Skin: Skin is warm and dry.  Psychiatric: She has a normal mood and affect.  Symmetric normal motor tone is noted throughout. Normal muscle bulk. Muscle testing reveals 5/5 muscle strength of the upper extremity, and 5/5 of the lower extremity, except left tibialis anterior 4-/5. Full range of motion in  upper and lower extremities. ROM of spine is restricted.  DTR in the upper and lower extremity are present and symmetric 2+, except could not elicit left patella tendon reflex, because of pain. No clonus is noted.  Patient arises from chair with some difficulty. Wide based gait with cane. Walking on heels and toes, not tested because of safety reasons . Very tender to touch at left anterior knee.         Assessment & Plan:  Chronic low back pain , MR I showed left lateral disc protrusion at L3.  Chronic left knee pain, status post partial knee replacement on the left.  Advised patient to continue with her exercises and her walking program.  Also advised patient to try to use the pool of her gym.  She should also continue medication regimen.

## 2012-03-10 NOTE — Patient Instructions (Signed)
Advised patient to exercise in the water in her gym.

## 2012-04-01 ENCOUNTER — Other Ambulatory Visit: Payer: Self-pay | Admitting: Physical Medicine and Rehabilitation

## 2012-04-04 ENCOUNTER — Encounter: Payer: Self-pay | Admitting: Physical Medicine and Rehabilitation

## 2012-04-04 ENCOUNTER — Encounter
Payer: Worker's Compensation | Attending: Physical Medicine and Rehabilitation | Admitting: Physical Medicine and Rehabilitation

## 2012-04-04 VITALS — BP 176/75 | HR 58 | Resp 14 | Ht 66.0 in | Wt 215.0 lb

## 2012-04-04 DIAGNOSIS — Z96659 Presence of unspecified artificial knee joint: Secondary | ICD-10-CM | POA: Insufficient documentation

## 2012-04-04 DIAGNOSIS — M545 Low back pain, unspecified: Secondary | ICD-10-CM | POA: Insufficient documentation

## 2012-04-04 DIAGNOSIS — M25562 Pain in left knee: Secondary | ICD-10-CM

## 2012-04-04 DIAGNOSIS — G8929 Other chronic pain: Secondary | ICD-10-CM

## 2012-04-04 DIAGNOSIS — M25569 Pain in unspecified knee: Secondary | ICD-10-CM | POA: Insufficient documentation

## 2012-04-04 DIAGNOSIS — M5126 Other intervertebral disc displacement, lumbar region: Secondary | ICD-10-CM | POA: Insufficient documentation

## 2012-04-04 MED ORDER — MORPHINE SULFATE 15 MG PO TABS: 15.0000 mg | ORAL_TABLET | Freq: Two times a day (BID) | ORAL | Status: DC

## 2012-04-04 MED ORDER — MORPHINE SULFATE ER 15 MG PO TBCR
15.0000 mg | EXTENDED_RELEASE_TABLET | Freq: Once | ORAL | Status: DC
Start: 2012-04-04 — End: 2012-05-07

## 2012-04-04 NOTE — Patient Instructions (Signed)
Continue with exercising on your glider.

## 2012-04-04 NOTE — Progress Notes (Signed)
Subjective:    Patient ID: Paige Arnold, female    DOB: 1940-10-30, 71 y.o.   MRN: [id number]  HPI The patient is a 71 year old female female, who presents with low back and left knee pain . The symptoms started over 13 years ago. The patient complains about moderate pain , which radiate to the left lateral lower extremity down to the toes, intermittendly. Patient also complains about numbness and tingling in the same distribution . Applying heat, taking medications , changing positions alleviate the symptoms. Prolonged standing aggrevates the symptoms. The patient grades her pain as a 8 /10. The patient complains about increased low back pain, since she has pain in her bladder and urinary tract. She is seeing a urologist, who ordered a CT-scan of her bladder and kidneys. 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 She states, that she will see the urologist again next week, for further treatment.  Pain Inventory Average Pain 8 Pain Right Now 8 My pain is constant  In the last 24 hours, has pain interfered with the following? General activity 9 Relation with others 9 Enjoyment of life 10 What TIME of day is your pain at its worst? all the time Sleep (in general) Good  Pain is worse with: walking, bending, sitting, standing and some activites Pain improves with: heat/ice and medication Relief from Meds: 6  Mobility use a cane how many minutes can you walk? 5 ability to climb steps?  yes do you drive?  no Do you have any goals in this area?  no  Function retired Do you have any goals in this area?  no  Neuro/Psych weakness tingling  Prior Studies x-rays CT/MRI  Physicians involved in your care Any changes since last visit?  no   Family History  Problem Relation Age of Onset  . Kidney disease Mother   . Heart disease Father   . Diabetes Brother   . Alcohol abuse Brother   . Depression Brother   . Sarcoidosis Brother   . ALS Brother   . Heart disease Brother    History    Social History  . Marital Status: Married    Spouse Name: N/A    Number of Children: N/A  . Years of Education: N/A   Social History Main Topics  . Smoking status: Never Smoker   . Smokeless tobacco: None  . Alcohol Use: No  . Drug Use: No  . Sexually Active: None   Other Topics Concern  . None   Social History Narrative  . None   Past Surgical History  Procedure Date  . Tubal ligation   . Temporomandibular joint surgery   . Partial knee arthroplasty    Past Medical History  Diagnosis Date  . Thyroid disorder   . Kidney disease   . Arthritis   . Hypertension   . Diabetes mellitus    BP 176/75  Pulse 58  Resp 14  Ht 5\' 6"  (1.676 m)  Wt 215 lb (97.523 kg)  BMI 34.70 kg/m2  SpO2 90%     Review of Systems  Gastrointestinal: Positive for constipation.  Musculoskeletal: Positive for myalgias, back pain and arthralgias.  Neurological: Positive for weakness.  All other systems reviewed and are negative.       Objective:   Physical Exam Head: Normocephalic.  Neck: Normal range of motion.  Neurological: She is alert and oriented to person, place, and time.  Skin: Skin is warm and dry.  Psychiatric: She has a  normal mood and affect.  Symmetric normal motor tone is noted throughout. Normal muscle bulk. Muscle testing reveals 5/5 muscle strength of the upper extremity, and 5/5 of the lower extremity, except left tibialis anterior 4-/5. Full range of motion in upper and lower extremities. ROM of spine is restricted.  DTR in the upper and lower extremity are present and symmetric 2+, except could not elicit left patella tendon reflex, because of pain. No clonus is noted.  Patient arises from chair with some difficulty. Wide based gait with cane. Walking on heels and toes, not tested because of safety reasons . Very tender to touch at left anterior knee.  Tender at mid low back bilateral.        Assessment & Plan:  Chronic low back pain , MR I showed left  lateral disc protrusion at L3.  Chronic left knee pain, status post partial knee replacement on the left.  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 There is a possibility that her increased bilateral, mid LBP is coming from her bladder and kidney issues. If she continues to have increased LBP, after sufficient treatment from urologist, consider injection or radiofrequency ablation. Advised patient to continue with her exercises and her walking program. Also advised patient to try to use the pool of her gym, after bladder problems have resolved. She should also continue medication regimen.

## 2012-04-09 ENCOUNTER — Other Ambulatory Visit: Payer: Self-pay | Admitting: Physical Medicine and Rehabilitation

## 2012-05-07 ENCOUNTER — Encounter
Payer: Worker's Compensation | Attending: Physical Medicine and Rehabilitation | Admitting: Physical Medicine and Rehabilitation

## 2012-05-07 ENCOUNTER — Encounter: Payer: Self-pay | Admitting: Physical Medicine and Rehabilitation

## 2012-05-07 VITALS — BP 146/82 | HR 67 | Resp 14 | Ht 66.0 in | Wt 213.0 lb

## 2012-05-07 DIAGNOSIS — M25569 Pain in unspecified knee: Secondary | ICD-10-CM | POA: Insufficient documentation

## 2012-05-07 DIAGNOSIS — M545 Low back pain, unspecified: Secondary | ICD-10-CM | POA: Insufficient documentation

## 2012-05-07 DIAGNOSIS — M5126 Other intervertebral disc displacement, lumbar region: Secondary | ICD-10-CM | POA: Insufficient documentation

## 2012-05-07 DIAGNOSIS — I10 Essential (primary) hypertension: Secondary | ICD-10-CM | POA: Insufficient documentation

## 2012-05-07 DIAGNOSIS — Z96659 Presence of unspecified artificial knee joint: Secondary | ICD-10-CM | POA: Insufficient documentation

## 2012-05-07 DIAGNOSIS — E119 Type 2 diabetes mellitus without complications: Secondary | ICD-10-CM | POA: Insufficient documentation

## 2012-05-07 DIAGNOSIS — G8929 Other chronic pain: Secondary | ICD-10-CM | POA: Insufficient documentation

## 2012-05-07 MED ORDER — MORPHINE SULFATE ER 15 MG PO TBCR: 15.0000 mg | EXTENDED_RELEASE_TABLET | Freq: Once | ORAL | Status: DC

## 2012-05-07 MED ORDER — MORPHINE SULFATE 15 MG PO TABS: 15.0000 mg | ORAL_TABLET | Freq: Two times a day (BID) | ORAL | Status: DC

## 2012-05-07 MED ORDER — LIDOCAINE 5 % EX PTCH: 1.0000 | MEDICATED_PATCH | CUTANEOUS | Status: DC

## 2012-05-07 NOTE — Patient Instructions (Addendum)
Please keep your pain medications locked up and in a secure location  We have discussed considering changing your long acting morphine to short acting next month.  I will see you back next month.

## 2012-05-07 NOTE — Progress Notes (Signed)
Subjective:    Patient ID: Paige Arnold, female    DOB: 09-24-1940, 71 y.o.   MRN: [id number]  HPI [name] is a pleasant 71 year old married woman who is followed  here at [facility] for chronic pain  complaints related to her left lower extremity. She also has some  complaints of back pain and right lateral hip pain.  She has a far lateral disk protrusion at L3-4 impacting her left L3 root  per MRI which was done back in November 2011.  She also has a history of partial knee replacement per Dr. Percell Miller in  Q000111Q which was complicated by history of complex regional pain syndrome.  Today her hips are not bothering her as much.  She had her right greater trochanter bursa injected back in May of 2012.  This helped her significantly for many months. She has been in a physical  therapy program as well.  Her average pain is 5 today. Sometimes it goes up to about an 8. Pain  is worse in the morning, daytime, and evening, not as bad at night.  Pain is worse with walking, bending, and standing. Improves with rest,  heat, ice, medication. She reports fair to good relief with current  Meds.  She tells me that she has been on an exercise glider for 20 minutes a day now for the last 3 months.  Her low back is bothering today. She attributes this to using her glider without her lumbar support.  She is here for a refill on her pain medications     Pain Inventory Average Pain 7 Pain Right Now 7 My pain is sharp, dull, stabbing, tingling and aching  In the last 24 hours, has pain interfered with the following? General activity 8 Relation with others 8 Enjoyment of life 9 What TIME of day is your pain at its worst? morning, day and evening Sleep (in general) Fair  Pain is worse with: walking, bending, standing and some activites Pain improves with: rest, heat/ice and medication Relief from Meds: 6  Mobility use a cane how many minutes can you walk?  10 ability to climb steps?  yes do you drive?  no  Function disabled: date disabled  retired I need assistance with the following:  household duties  Neuro/Psych tingling  Prior Studies Any changes since last visit?  no  Physicians involved in your care Any changes since last visit?  no   Family History  Problem Relation Age of Onset  . Kidney disease Mother   . Heart disease Father   . Diabetes Brother   . Alcohol abuse Brother   . Depression Brother   . Sarcoidosis Brother   . ALS Brother   . Heart disease Brother    History   Social History  . Marital Status: Married    Spouse Name: N/A    Number of Children: N/A  . Years of Education: N/A   Social History Main Topics  . Smoking status: Never Smoker   . Smokeless tobacco: None  . Alcohol Use: No  . Drug Use: No  . Sexually Active: None   Other Topics Concern  . None   Social History Narrative  . None   Past Surgical History  Procedure Date  . Tubal ligation   . Temporomandibular joint surgery   . Partial knee arthroplasty    Past Medical History  Diagnosis Date  . Thyroid disorder   . Kidney disease   .  Arthritis   . Hypertension   . Diabetes mellitus    BP 146/82  Pulse 67  Resp 14  Ht 5\' 6"  (1.676 m)  Wt 213 lb (96.616 kg)  BMI 34.38 kg/m2  SpO2 97%     Review of Systems  Gastrointestinal: Positive for constipation.  Musculoskeletal: Positive for myalgias, back pain, arthralgias and gait problem.  All other systems reviewed and are negative.       Objective:   Physical Exam  The patient is alert oriented cooperative pleasant ,affect is bright alert, answers questions appropriately.  Cranial nerves are grossly intact coordination is intact  Manual muscle testing of upper and lower extremities reveals 5 over 5 strength.  Sensory exam she reports decreased sensation around the area of the left knee mostly anteriorly.  Gait stable  Minimal Difficulty with tandem gait   Decreased range of motion lumbar spine only in extension  Full range of motion and knees without effusion, hypersensitive around left knee which is not new.  Straight leg raise is negative in bilateral lower extremities.  Minimal tenderness along trochanter and Illiotibial band today  Terminal and external rotation at the hips does not provoke groin or posterior hip pain.        Assessment & Plan:  1. Chronic left knee pain status post partial knee replacement on the  left complicated by a complex regional pain syndrome, Dr. Percell Miller in  [place] with known lumbar spondylosis.  3. History of bilateral disk protrusion at L3-4 impacting left L3  root.  4. Bilateral trochanteric bursitis, left greater than right. The  patient is status post a right trochanter injection back in May  2012, this helped her.  Bursitis has been much better since she started exercising with the glider.  Recommend to use lumbar support while on glider.  Plan:  Continue immediate release MORPHINE 15 mg at 10 AM and at 10 PM. (Twice a day)  MS Contin at 3 PM. ( once a day )  The Opioid medication pill count was appropriate. No reported significant side effects of Opioid medications noted. No aberrant behavior noted. Patient cautioned regarding operation of machinery or vehicles. Patient understands risk and benefits of these medications. There is no indication or report of risk to self or others.  The patient will followup over the next 2 months with nursing staff for pill count and refill of her pain medications.  Her medical problems include diabetes mellitus, hypertension,  hypothyroidism, depression, hypercholesterolemia, and history of  kidney problems. She sees Dr. Lorrene Reid.    Pt is still using glider for 20 min a day, says she misses it if she does not do it each day.    Physical medicine and rehabilitation goals include maintaining or improving physical function and minimizing oral pain  medications.   We have also discussed considering discontinuing MS Contin and replacing with shorter acting morphine. She is open to this.  She has been seeing a kidney specialist recently and will let mean know or detail next month.  Followup in one month

## 2012-05-09 ENCOUNTER — Other Ambulatory Visit: Payer: Self-pay | Admitting: Physical Medicine and Rehabilitation

## 2012-05-09 NOTE — Telephone Encounter (Signed)
Please advise if refilling meloxicam and volteran gel are appropriate.

## 2012-05-16 ENCOUNTER — Other Ambulatory Visit: Payer: Self-pay | Admitting: *Deleted

## 2012-06-16 ENCOUNTER — Encounter
Payer: Worker's Compensation | Attending: Physical Medicine and Rehabilitation | Admitting: Physical Medicine and Rehabilitation

## 2012-06-16 ENCOUNTER — Encounter: Payer: Self-pay | Admitting: Physical Medicine and Rehabilitation

## 2012-06-16 VITALS — BP 149/72 | HR 52 | Resp 14 | Ht 66.0 in | Wt 212.0 lb

## 2012-06-16 DIAGNOSIS — M25569 Pain in unspecified knee: Secondary | ICD-10-CM

## 2012-06-16 DIAGNOSIS — G8929 Other chronic pain: Secondary | ICD-10-CM

## 2012-06-16 DIAGNOSIS — M5126 Other intervertebral disc displacement, lumbar region: Secondary | ICD-10-CM | POA: Insufficient documentation

## 2012-06-16 DIAGNOSIS — M545 Low back pain, unspecified: Secondary | ICD-10-CM | POA: Insufficient documentation

## 2012-06-16 DIAGNOSIS — M76899 Other specified enthesopathies of unspecified lower limb, excluding foot: Secondary | ICD-10-CM

## 2012-06-16 DIAGNOSIS — M7062 Trochanteric bursitis, left hip: Secondary | ICD-10-CM

## 2012-06-16 DIAGNOSIS — M7061 Trochanteric bursitis, right hip: Secondary | ICD-10-CM

## 2012-06-16 DIAGNOSIS — E119 Type 2 diabetes mellitus without complications: Secondary | ICD-10-CM | POA: Insufficient documentation

## 2012-06-16 DIAGNOSIS — I10 Essential (primary) hypertension: Secondary | ICD-10-CM | POA: Insufficient documentation

## 2012-06-16 DIAGNOSIS — Z96659 Presence of unspecified artificial knee joint: Secondary | ICD-10-CM | POA: Insufficient documentation

## 2012-06-16 MED ORDER — MORPHINE SULFATE 15 MG PO TABS: 15.0000 mg | ORAL_TABLET | Freq: Three times a day (TID) | ORAL | Status: DC | PRN

## 2012-06-16 NOTE — Patient Instructions (Signed)
I have switched your MS Contin to immediate release morphine.  Please take your pain medications as prescribed.  Please make sure they are locked up and in a secure location.

## 2012-06-16 NOTE — Progress Notes (Signed)
Subjective:    Patient ID: Paige Arnold, female    DOB: 05-21-1941, 71 y.o.   MRN: [id number]  HPI  Paige Arnold is a pleasant 71 year old married woman who is followed  here at [facility] for chronic pain  complaints related to her left lower extremity. She also has some  complaints of back pain and right lateral hip pain.  She has a far lateral disk protrusion at L3-4 impacting her left L3 root  per MRI which was done back in November 2011.  She also has a history of partial knee replacement per Dr. Percell Miller in  Q000111Q which was complicated by history of complex regional pain syndrome.  Today her hips are not bothering her as much.    She had her right greater trochanter bursa injected back in May of 2012.  This helped her significantly for many months. She has been in a physical  therapy program as well.    Sometimes it goes up to about an 8. Pain  is worse in the morning, daytime, and evening, not as bad at night.  Pain is worse with walking, bending, and standing. Improves with rest,  heat, ice, medication. She reports fair to good relief with current  Meds.   Overall she's feeling quite well today.  She continues to use her glider on a daily basis for 20 minutes. She is here for a refill on her pain medications      Pain Inventory Average Pain 7 Pain Right Now 7 My pain is sharp, stabbing and tingling  In the last 24 hours, has pain interfered with the following? General activity 9 Relation with others 9 Enjoyment of life 9 What TIME of day is your pain at its worst? morning, day and evening Sleep (in general) Fair  Pain is worse with: walking, bending and standing Pain improves with: rest and medication Relief from Meds: 7  Mobility use a cane ability to climb steps?  yes do you drive?  no  Function retired  Neuro/Psych No problems in this area  Prior Studies Any changes since last visit?  no  Physicians involved  in your care Any changes since last visit?  no   Family History  Problem Relation Age of Onset  . Kidney disease Mother   . Heart disease Father   . Diabetes Brother   . Alcohol abuse Brother   . Depression Brother   . Sarcoidosis Brother   . ALS Brother   . Heart disease Brother    History   Social History  . Marital Status: Married    Spouse Name: N/A    Number of Children: N/A  . Years of Education: N/A   Social History Main Topics  . Smoking status: Never Smoker   . Smokeless tobacco: None  . Alcohol Use: No  . Drug Use: No  . Sexually Active: None   Other Topics Concern  . None   Social History Narrative  . None   Past Surgical History  Procedure Date  . Tubal ligation   . Temporomandibular joint surgery   . Partial knee arthroplasty    Past Medical History  Diagnosis Date  . Thyroid disorder   . Kidney disease   . Arthritis   . Hypertension   . Diabetes mellitus    BP 149/72  Pulse 52  Resp 14  Ht 5\' 6"  (1.676 m)  Wt 212 lb (96.163 kg)  BMI 34.22 kg/m2  SpO2 96%  Review of Systems  Musculoskeletal: Positive for myalgias, back pain and arthralgias.  All other systems reviewed and are negative.       Objective:   Physical Exam  The patient is alert oriented cooperative pleasant ,affect is bright alert, answers questions appropriately.  Cranial nerves are grossly intact coordination is intact  Manual muscle testing of upper and lower extremities reveals 5 over 5 strength.  Sensory exam she reports decreased sensation around the area of the left knee mostly anteriorly.  Gait stable  Minimal Difficulty with tandem gait  Decreased range of motion lumbar spine only in extension  Full range of motion and knees without effusion, hypersensitive around left knee which is not new.  Straight leg raise is negative in bilateral lower extremities.  Minimal tenderness along trochanter and Illiotibial band today  Terminal and external rotation  at the hips does not provoke groin or posterior hip pain.        Assessment & Plan:  1. Chronic left knee pain status post partial knee replacement on the  left complicated by a complex regional pain syndrome, Dr. Percell Miller in  1999. Stable  2. Lumbago with known lumbar spondylosis. Stable  3. History of bilateral disk protrusion at L3-4 impacting left L3  root. Stable  4. Bilateral trochanteric bursitis, left greater than right. The  patient is status post a right trochanter injection back in May  2012, this helped her. Not a problem currently  Bursitis has been much better since she started exercising with the glider.  Recommend to use lumbar support while on glider.    Plan:  Will discontinue MS Contin at 3 PM.  Will switch her to morphine sulfate 15 mg 3 times a day.  The Opioid medication pill count was appropriate. No reported significant side effects of Opioid medications noted. No aberrant behavior noted. Patient cautioned regarding operation of machinery or vehicles. Patient understands risk and benefits of these medications. There is no indication or report of risk to self or others.   Her medical problems include diabetes mellitus, hypertension,  hypothyroidism, depression, hypercholesterolemia, and history of  kidney problems. She sees Dr. Lorrene Reid.  Pt is still using glider for 20 min a day, says she misses it if she does not do it each day.   Physical medicine and rehabilitation goals include maintaining or improving physical function and minimizing oral pain medications.    Followup in one month

## 2012-07-21 ENCOUNTER — Ambulatory Visit: Payer: Self-pay | Admitting: Physical Medicine and Rehabilitation

## 2012-07-21 ENCOUNTER — Encounter
Payer: Worker's Compensation | Attending: Physical Medicine and Rehabilitation | Admitting: Physical Medicine and Rehabilitation

## 2012-07-21 ENCOUNTER — Encounter: Payer: Self-pay | Admitting: Physical Medicine and Rehabilitation

## 2012-07-21 VITALS — BP 134/81 | HR 64 | Resp 14 | Ht 67.0 in | Wt 213.0 lb

## 2012-07-21 DIAGNOSIS — G8929 Other chronic pain: Secondary | ICD-10-CM | POA: Insufficient documentation

## 2012-07-21 DIAGNOSIS — R4189 Other symptoms and signs involving cognitive functions and awareness: Secondary | ICD-10-CM | POA: Insufficient documentation

## 2012-07-21 DIAGNOSIS — Z5181 Encounter for therapeutic drug level monitoring: Secondary | ICD-10-CM

## 2012-07-21 DIAGNOSIS — I10 Essential (primary) hypertension: Secondary | ICD-10-CM | POA: Insufficient documentation

## 2012-07-21 DIAGNOSIS — M545 Low back pain, unspecified: Secondary | ICD-10-CM | POA: Insufficient documentation

## 2012-07-21 DIAGNOSIS — M5126 Other intervertebral disc displacement, lumbar region: Secondary | ICD-10-CM | POA: Insufficient documentation

## 2012-07-21 DIAGNOSIS — E119 Type 2 diabetes mellitus without complications: Secondary | ICD-10-CM | POA: Insufficient documentation

## 2012-07-21 DIAGNOSIS — M549 Dorsalgia, unspecified: Secondary | ICD-10-CM

## 2012-07-21 DIAGNOSIS — R413 Other amnesia: Secondary | ICD-10-CM | POA: Insufficient documentation

## 2012-07-21 DIAGNOSIS — R3989 Other symptoms and signs involving the genitourinary system: Secondary | ICD-10-CM | POA: Insufficient documentation

## 2012-07-21 DIAGNOSIS — M25562 Pain in left knee: Secondary | ICD-10-CM

## 2012-07-21 DIAGNOSIS — R209 Unspecified disturbances of skin sensation: Secondary | ICD-10-CM | POA: Insufficient documentation

## 2012-07-21 DIAGNOSIS — Z79899 Other long term (current) drug therapy: Secondary | ICD-10-CM | POA: Insufficient documentation

## 2012-07-21 DIAGNOSIS — M25569 Pain in unspecified knee: Secondary | ICD-10-CM | POA: Insufficient documentation

## 2012-07-21 MED ORDER — MORPHINE SULFATE 15 MG PO TABS: 15.0000 mg | ORAL_TABLET | Freq: Three times a day (TID) | ORAL | Status: DC | PRN

## 2012-07-21 MED ORDER — DICLOFENAC SODIUM 1 % TD GEL: 2.0000 g | Freq: Four times a day (QID) | TRANSDERMAL | Status: DC

## 2012-07-21 MED ORDER — TOPIRAMATE 50 MG PO TABS: 50.0000 mg | ORAL_TABLET | Freq: Two times a day (BID) | ORAL | Status: DC

## 2012-07-21 NOTE — Patient Instructions (Addendum)
Continue with your exercising.Decrease your Topamax to twice a day for 2 weeks, then take one tablet a day for 2 weeks. Call us with any problems.

## 2012-07-21 NOTE — Progress Notes (Signed)
Subjective:    Patient ID: Paige Arnold, female    DOB: Aug 03, 1940, 72 y.o.   MRN: [id number]  HPI The patient is a 72 year old female female, who presents with low back and left knee pain . The symptoms started over 13 years ago. The patient complains about moderate pain , which radiate to the left lateral lower extremity down to the toes, intermittendly. Patient also complains about numbness and tingling in the same distribution . Applying heat, taking medications , changing positions alleviate the symptoms. Prolonged standing aggrevates the symptoms. The patient grades her pain as a 8 /10. The patient complains about increased low back pain, since she has pain in her bladder and urinary tract. She is seeing a urologist, who ordered a CT-scan of her bladder and kidneys. 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 The patient is here with her daughter, who is concerned about some memory problems her mother has developed, and her mother is also complaining about some hairloss.  Pain Inventory Average Pain 9 Pain Right Now 9 My pain is aching  In the last 24 hours, has pain interfered with the following? General activity 8 Relation with others 8 Enjoyment of life 9 What TIME of day is your pain at its worst? morning, day and evening Sleep (in general) Fair  Pain is worse with: walking, bending, standing and some activites Pain improves with: rest, heat/ice and medication Relief from Meds: 5  Mobility use a cane ability to climb steps?  yes do you drive?  no needs help with transfers  Function retired  Neuro/Psych No problems in this area  Prior Studies Any changes since last visit?  no  Physicians involved in your care Any changes since last visit?  no   Family History  Problem Relation Age of Onset  . Kidney disease Mother   . Heart disease Father   . Diabetes Brother   . Alcohol abuse Brother   . Depression Brother   . Sarcoidosis Brother   . ALS  Brother   . Heart disease Brother    History   Social History  . Marital Status: Married    Spouse Name: N/A    Number of Children: N/A  . Years of Education: N/A   Social History Main Topics  . Smoking status: Never Smoker   . Smokeless tobacco: None  . Alcohol Use: No  . Drug Use: No  . Sexually Active: None   Other Topics Concern  . None   Social History Narrative  . None   Past Surgical History  Procedure Date  . Tubal ligation   . Temporomandibular joint surgery   . Partial knee arthroplasty    Past Medical History  Diagnosis Date  . Thyroid disorder   . Kidney disease   . Arthritis   . Hypertension   . Diabetes mellitus    BP 134/81  Pulse 64  Resp 14  Ht 5\' 7"  (1.702 m)  Wt 213 lb (96.616 kg)  BMI 33.36 kg/m2  SpO2 96%     Review of Systems  Gastrointestinal: Positive for constipation.  Musculoskeletal: Positive for back pain.  All other systems reviewed and are negative.       Objective:   Physical Exam Head: Normocephalic.  Neck: Normal range of motion.  Neurological: She is alert and oriented to person, place, and time.  Skin: Skin is warm and dry.  Psychiatric: She has a normal mood and affect.  Symmetric normal motor tone is noted throughout. Normal muscle bulk. Muscle testing reveals 5/5 muscle strength of the upper extremity, and 5/5 of the lower extremity, except left tibialis anterior 4-/5. Full range of motion in upper and lower extremities. ROM of spine is restricted.  DTR in the upper and lower extremity are present and symmetric 2+, except could not elicit left patella tendon reflex, because of pain. No clonus is noted.  Patient arises from chair with some difficulty. Wide based gait with cane. Walking on heels and toes, not tested because of safety reasons . Very tender to touch at left anterior knee.  Tender at mid low back bilateral.        Assessment & Plan:  Chronic low back pain , MR I showed left lateral disc  protrusion at L3.  Chronic left knee pain, status post partial knee replacement on the left.  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 There is a possibility that her increased bilateral, mid LBP is coming from her bladder and kidney issues. If she continues to have increased LBP, after sufficient treatment from urologist, consider injection or radiofrequency ablation.  Memory difficulties, decrease in cognitive function, and hair loss, could be side effects of the topamax she is on for a long time. The patient does not know why she is taking this medication. We will try to taper her down, by decreasing her topamax 50mg  tid, to bid for two weeks and then, if she tolerates this to 50mg  per day for 2 weeks, then she should follow up with me. I also referred her to neurology to be evaluated for her memory difficulties. Spent extended time ( over 25 min) with the patient today, to explain and answer questions from her and her daughter. Advised patient to continue with her exercises and her walking program. Also advised patient to try to use the pool of her gym, after bladder problems have resolved. She should also continue her pain medication regimen.

## 2012-08-21 ENCOUNTER — Encounter
Payer: Worker's Compensation | Attending: Physical Medicine and Rehabilitation | Admitting: Physical Medicine and Rehabilitation

## 2012-08-21 ENCOUNTER — Encounter: Payer: Self-pay | Admitting: Physical Medicine and Rehabilitation

## 2012-08-21 VITALS — BP 157/80 | HR 65 | Resp 14 | Ht 66.0 in | Wt 212.0 lb

## 2012-08-21 DIAGNOSIS — M545 Low back pain, unspecified: Secondary | ICD-10-CM | POA: Insufficient documentation

## 2012-08-21 DIAGNOSIS — M25569 Pain in unspecified knee: Secondary | ICD-10-CM | POA: Insufficient documentation

## 2012-08-21 DIAGNOSIS — Z96659 Presence of unspecified artificial knee joint: Secondary | ICD-10-CM | POA: Insufficient documentation

## 2012-08-21 DIAGNOSIS — M25562 Pain in left knee: Secondary | ICD-10-CM

## 2012-08-21 DIAGNOSIS — M5126 Other intervertebral disc displacement, lumbar region: Secondary | ICD-10-CM | POA: Insufficient documentation

## 2012-08-21 DIAGNOSIS — G8929 Other chronic pain: Secondary | ICD-10-CM | POA: Insufficient documentation

## 2012-08-21 MED ORDER — MORPHINE SULFATE 15 MG PO TABS: 15.0000 mg | ORAL_TABLET | Freq: Three times a day (TID) | ORAL | Status: DC | PRN

## 2012-08-21 MED ORDER — LIDOCAINE 5 % EX PTCH: 1.0000 | MEDICATED_PATCH | CUTANEOUS | Status: DC

## 2012-08-21 NOTE — Progress Notes (Signed)
Subjective:    Patient ID: Paige Arnold, female    DOB: 1940-10-25, 72 y.o.   MRN: [id number]  HPI The patient is a 72 year old female female, who presents with low back and left knee pain . The symptoms started over 13 years ago. The patient complains about moderate pain , which radiate to the left lateral lower extremity down to the toes, intermittendly. Patient also complains about numbness and tingling in the same distribution . Applying heat, taking medications , changing positions alleviate the symptoms. Prolonged standing aggrevates the symptoms. The patient grades her pain as a 8 /10. The patient complains about increased low back pain, since she has pain in her bladder and urinary tract. She is seeing a urologist, who ordered a CT-scan of her bladder and kidneys. 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  The patient was here with her daughter, who is concerned about some memory problems her mother has developed, she states, that she has followed up with Dr. Tish Frederickson after I referred her, he MMSE was normal. He has ordered a MRI of the brain, results pending. Today the patient tells me that she feels a little bloated, and fatigued, and that she has some pain in her lower abdomen, she also complains about hot flashes.  Pain Inventory Average Pain 7 Pain Right Now 6 My pain is intermittent  In the last 24 hours, has pain interfered with the following? General activity 9 Relation with others 9 Enjoyment of life 9 What TIME of day is your pain at its worst? morning, day, evening Sleep (in general) Fair  Pain is worse with: walking, bending and standing Pain improves with: rest and heat/ice Relief from Meds: 6  Mobility use a cane ability to climb steps?  yes do you drive?  no  Function retired I need assistance with the following:  household duties  Neuro/Psych weakness tingling dizziness anxiety  Prior Studies Any changes since last visit?   no  Physicians involved in your care Any changes since last visit?  no   Family History  Problem Relation Age of Onset  . Kidney disease Mother   . Heart disease Father   . Diabetes Brother   . Alcohol abuse Brother   . Depression Brother   . Sarcoidosis Brother   . ALS Brother   . Heart disease Brother    History   Social History  . Marital Status: Married    Spouse Name: N/A    Number of Children: N/A  . Years of Education: N/A   Social History Main Topics  . Smoking status: Never Smoker   . Smokeless tobacco: None  . Alcohol Use: No  . Drug Use: No  . Sexually Active: None   Other Topics Concern  . None   Social History Narrative  . None   Past Surgical History  Procedure Date  . Tubal ligation   . Temporomandibular joint surgery   . Partial knee arthroplasty    Past Medical History  Diagnosis Date  . Thyroid disorder   . Kidney disease   . Arthritis   . Hypertension   . Diabetes mellitus    BP 157/80  Pulse 65  Resp 14  Ht 5\' 6"  (1.676 m)  Wt 212 lb (96.163 kg)  BMI 34.22 kg/m2  SpO2 93%     Review of Systems  Gastrointestinal: Positive for abdominal pain and constipation.  Musculoskeletal: Positive for back pain.  Neurological: Positive for  dizziness and weakness.  Psychiatric/Behavioral: The patient is nervous/anxious.   All other systems reviewed and are negative.       Objective:   Physical Exam Head: Normocephalic.  Neck: Normal range of motion.  Neurological: She is alert and oriented to person, place, and time.  Skin: Skin is warm and dry.  Psychiatric: She has a normal mood and affect.  Symmetric normal motor tone is noted throughout. Normal muscle bulk. Muscle testing reveals 5/5 muscle strength of the upper extremity, and 5/5 of the lower extremity, except left tibialis anterior 4-/5. Full range of motion in upper and lower extremities. ROM of spine is restricted.  DTR in the upper and lower extremity are present and  symmetric 2+, except could not elicit left patella tendon reflex, because of pain. No clonus is noted.  Patient arises from chair with some difficulty. Wide based gait with cane. Walking on heels and toes, not tested because of safety reasons . Very tender to touch at left anterior knee.  Tender at mid low back bilateral.        Assessment & Plan:  Chronic low back pain , MR I showed left lateral disc protrusion at L3.  Chronic left knee pain, status post partial knee replacement on the left.  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 There is a possibility that her increased bilateral, mid LBP is coming from her bladder and kidney issues. If she continues to have increased LBP, after sufficient treatment from urologist, consider injection or radiofrequency ablation.  Memory difficulties, decrease in cognitive function, and hair loss, could be side effects of the topamax she is on for a long time. The patient does not know why she is taking this medication.She has tapered down and d/c.  I also referred her to neurology to be evaluated for her memory difficulties at her last visit, she saw Dr. Tish Frederickson, who sent me a report, which should be scaned in. Her MMSE was normal, he also did some blood work and a MRI of the brain, results pending. The patient tells me that she feels a little bloated, and fatigued, and that she has some pain in her lower abdomen, she also complains about hot flashes. She has not seen an OBGYN for over ten years, I advised her to make an appointment. Spent extended time ( over 25 min) with the patient today.  Advised patient to continue with her exercises and her walking program. Also advised patient to try to use the pool of her gym, after bladder problems have resolved. She should also continue her pain medication  regimen.

## 2012-08-21 NOTE — Patient Instructions (Signed)
Continue working out with [employer].

## 2012-09-24 ENCOUNTER — Encounter
Payer: Worker's Compensation | Attending: Physical Medicine and Rehabilitation | Admitting: Physical Medicine and Rehabilitation

## 2012-09-24 ENCOUNTER — Encounter: Payer: Self-pay | Admitting: Physical Medicine and Rehabilitation

## 2012-09-24 VITALS — BP 188/84 | HR 66 | Resp 16 | Ht 66.0 in | Wt 215.0 lb

## 2012-09-24 DIAGNOSIS — M79609 Pain in unspecified limb: Secondary | ICD-10-CM | POA: Insufficient documentation

## 2012-09-24 DIAGNOSIS — M545 Low back pain, unspecified: Secondary | ICD-10-CM | POA: Insufficient documentation

## 2012-09-24 DIAGNOSIS — M79605 Pain in left leg: Secondary | ICD-10-CM

## 2012-09-24 MED ORDER — MORPHINE SULFATE 15 MG PO TABS: 15.0000 mg | ORAL_TABLET | Freq: Three times a day (TID) | ORAL | Status: DC | PRN

## 2012-09-24 MED ORDER — MORPHINE SULFATE 15 MG PO TABS: 15.0000 mg | ORAL_TABLET | Freq: Three times a day (TID) | ORAL | Status: DC

## 2012-09-24 NOTE — Progress Notes (Signed)
Subjective:    Patient ID: Paige Arnold, female    DOB: [birth year], 72 y.o.   MRN: [id number]  HPI [name] is a pleasant 72 year old married woman who is followed  here at [facility] for chronic pain  complaints related to her left lower extremity. She also has some  complaints of back pain and right lateral hip pain.  She has a far lateral disk protrusion at L3-4 impacting her left L3 root  per MRI which was done back in November 2011.   She also has a history of partial knee replacement per Dr. Percell Miller in  Q000111Q which was complicated by history of complex regional pain syndrome.   She was last seen by me early December 2014 her pain was a 7 on a scale of 10.  She was seen by PA January 6 and her pain had increased to a 9 on a scale of 10. She reports she has weaned herself off of Topamax. She was concerned about side effects from this medication.   She reports worsening of her left leg pain since discontinuing Topamax.  She is seeing a urologist, who ordered a CT-scan of her bladder and kidneys. 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  Daughter is concerned about some memory problems her mother has developed, she states at visit in January, that she has followed up with Dr. Tish Frederickson.  MMSE was normal. He has ordered a MRI of the brain, results pending. She has appt with him in about 3 weeks.   She continues to use her glider on a daily basis for 20 minutes.  She is here for a refill on her pain medications       Pain Inventory Average Pain 8 Pain Right Now 8 My pain is sharp, dull, stabbing and aching  In the last 24 hours, has pain interfered with the following? General activity 10 Relation with others 9 Enjoyment of life 10 What TIME of day is your pain at its worst? day time,evening,night time Sleep (in general) Poor  Pain is worse with: walking, bending, standing and some activites Pain improves with: rest, heat/ice and  medication Relief from Meds: 1  Mobility use a cane ability to climb steps?  yes do you drive?  no  Function disabled: date disabled 1999 I need assistance with the following:  household duties  Neuro/Psych weakness tingling anxiety  Prior Studies Any changes since last visit?  yes CT/MRI  Physicians involved in your care Any changes since last visit?  no   Family History  Problem Relation Age of Onset  . Kidney disease Mother   . Heart disease Father   . Diabetes Brother   . Alcohol abuse Brother   . Depression Brother   . Sarcoidosis Brother   . ALS Brother   . Heart disease Brother    History   Social History  . Marital Status: Married    Spouse Name: N/A    Number of Children: N/A  . Years of Education: N/A   Social History Main Topics  . Smoking status: Never Smoker   . Smokeless tobacco: None  . Alcohol Use: No  . Drug Use: No  . Sexually Active: None   Other Topics Concern  . None   Social History Narrative  . None   Past Surgical History  Procedure Laterality Date  . Tubal ligation    . Temporomandibular joint surgery    . Partial knee arthroplasty  Past Medical History  Diagnosis Date  . Thyroid disorder   . Kidney disease   . Arthritis   . Hypertension   . Diabetes mellitus    BP 188/84  Pulse 66  Resp 16  Ht 5\' 6"  (1.676 m)  Wt 215 lb (97.523 kg)  BMI 34.72 kg/m2  SpO2 92%      Review of Systems  Constitutional: Positive for diaphoresis and unexpected weight change.  Gastrointestinal: Positive for constipation.  Genitourinary: Positive for dysuria.  Neurological: Positive for weakness.       Tingling  Psychiatric/Behavioral: Positive for agitation. The patient is nervous/anxious.   All other systems reviewed and are negative.       Objective:   Physical Exam  The patient is alert oriented cooperative pleasant ,affect is bright alert, answers questions appropriately.  Cranial nerves are grossly intact  coordination is intact  Manual muscle testing of upper and lower extremities reveals 5 over 5 strength.  Sensory exam she reports decreased sensation around the area of the left knee mostly anteriorly but also down past knee to foot..   DTR in the upper and lower extremity are present and symmetric 2+, except pt deferred left patella tendon reflex, because of pain. No clonus is noted.   Gait stable  Minimal Difficulty with tandem gait  Decreased range of motion lumbar spine only in extension  Full range of motion and knees without effusion, hypersensitive around left knee which is not new.  Straight leg raise is negative in bilateral lower extremities.  Minimal tenderness along trochanter and Illiotibial band today  internal and external rotation at the hips does not provoke groin or posterior hip pain.          Assessment & Plan:  1.  History of chronic low back pain with a 2 month history of radiating pain down the left leg to the foot.      Numbness noted distal to knee to anterior foot.    2.  Chronic left knee pain status post partial knee replacement on the  left complicated by a complex regional pain syndrome, Dr. Percell Miller in  1999. Pain is worse now in left leg for last 2 months.  3. Narcotic medication monitoring:     Pill counts appropriate.      No problems with over sedation     Pt takes meds appropriately  Plan:  Will obtain lumbar MRI.  Consider ESI.  Pt is reluctant about using more pain medication such as gabapentin or lyrica because of cognitive concerns.  Morphine sulfate 15mg  tid #90. Refilled  F/u after MRI

## 2012-09-24 NOTE — Patient Instructions (Signed)
I understand your left leg has been bothering you four over two months.  You have numbness and tingling going past the left knee and to the left foot.  I am ordering a lumbar MRI to evaluate this new pain for you.  I understand at this time you are not interested in using medications that may assist in decreasing your leg pain.  I understand you have concerns about your memory and are seeing a neurologist again in the next 2-3 weeks.  I will see you back after your MRI.  I have refilled your other pain medication for you today.

## 2012-10-03 ENCOUNTER — Telehealth: Payer: Self-pay | Admitting: *Deleted

## 2012-10-03 NOTE — Telephone Encounter (Signed)
Patient would like to know what her MRI results are. It's been over a month ago and nobody has called her.

## 2012-10-06 ENCOUNTER — Other Ambulatory Visit: Payer: Self-pay

## 2012-10-06 NOTE — Telephone Encounter (Signed)
Spoke to pt gave MRI results.

## 2012-10-09 ENCOUNTER — Ambulatory Visit
Admission: RE | Admit: 2012-10-09 | Discharge: 2012-10-09 | Disposition: A | Discharge status: Home / Self Care | Payer: Worker's Compensation | Source: Ambulatory Visit | Attending: Physical Medicine and Rehabilitation | Admitting: Physical Medicine and Rehabilitation

## 2012-10-09 DIAGNOSIS — M79605 Pain in left leg: Secondary | ICD-10-CM

## 2012-10-09 DIAGNOSIS — M545 Low back pain: Secondary | ICD-10-CM

## 2012-10-15 ENCOUNTER — Encounter: Payer: Self-pay | Admitting: Physical Medicine and Rehabilitation

## 2012-10-15 ENCOUNTER — Encounter
Payer: Worker's Compensation | Attending: Physical Medicine and Rehabilitation | Admitting: Physical Medicine and Rehabilitation

## 2012-10-15 VITALS — BP 160/86 | HR 75 | Resp 14 | Ht 66.0 in | Wt 215.0 lb

## 2012-10-15 DIAGNOSIS — M545 Low back pain, unspecified: Secondary | ICD-10-CM

## 2012-10-15 DIAGNOSIS — M25569 Pain in unspecified knee: Secondary | ICD-10-CM

## 2012-10-15 DIAGNOSIS — Z5181 Encounter for therapeutic drug level monitoring: Secondary | ICD-10-CM

## 2012-10-15 DIAGNOSIS — G8929 Other chronic pain: Secondary | ICD-10-CM

## 2012-10-15 NOTE — Progress Notes (Signed)
Subjective:    Patient ID: Paige Arnold, female    DOB: 07/27/1940, 72 y.o.   MRN: [id number]  HPI [name] is a pleasant 71 year old married woman who is followed  here at [facility] for chronic pain  complaints related to her left lower extremity. She also has some  complaints of back pain and right lateral hip pain.  She has a far lateral disk protrusion at L3-4 impacting her left L3 root  per MRI which was done back in November 2011.  She also has a history of partial knee replacement per Dr. Percell Miller in  Q000111Q which was complicated by history of complex regional pain syndrome.  She was last seen by me early December 2014 her pain was a 7 on a scale of 10.   She was seen by PA January 6 and her pain had increased to a 9 on a scale of 10. She reports she has weaned herself off of Topamax. She was concerned about side effects from this medication.  She reports worsening of her left leg pain since discontinuing Topamax.    She continues to use her glider on a daily basis for 20 minutes.  She is here for a refill on her pain medications      Pain Inventory Average Pain 7 Pain Right Now 7 My pain is dull, stabbing and aching  In the last 24 hours, has pain interfered with the following? General activity 4 Relation with others 9 Enjoyment of life 7 What TIME of day is your pain at its worst? varies Sleep (in general) Poor  Pain is worse with: walking, bending, sitting, standing and some activites Pain improves with: rest, heat/ice and medication Relief from Meds: 5  Mobility use a cane ability to climb steps?  yes do you drive?  yes  Function disabled: date disabled 1999 I need assistance with the following:  household duties  Neuro/Psych bladder control problems weakness tremor depression  Prior Studies Any changes since last visit?  yes CT/MRI  Physicians involved in your care Any changes since last visit?  no   Family History  Problem Relation  Age of Onset  . Kidney disease Mother   . Heart disease Father   . Diabetes Brother   . Alcohol abuse Brother   . Depression Brother   . Sarcoidosis Brother   . ALS Brother   . Heart disease Brother    History   Social History  . Marital Status: Married    Spouse Name: N/A    Number of Children: N/A  . Years of Education: N/A   Social History Main Topics  . Smoking status: Never Smoker   . Smokeless tobacco: Never Used  . Alcohol Use: No  . Drug Use: No  . Sexually Active: None   Other Topics Concern  . None   Social History Narrative  . None   Past Surgical History  Procedure Laterality Date  . Tubal ligation    . Temporomandibular joint surgery    . Partial knee arthroplasty     Past Medical History  Diagnosis Date  . Thyroid disorder   . Kidney disease   . Arthritis   . Hypertension   . Diabetes mellitus    BP 160/86  Pulse 75  Resp 14  Ht 5\' 6"  (1.676 m)  Wt 215 lb (97.523 kg)  BMI 34.72 kg/m2  SpO2 93%    Review of Systems  Constitutional: Positive for unexpected weight change.  Gastrointestinal: Positive for constipation.  Genitourinary: Positive for dysuria.  Musculoskeletal:       Knee pain  Neurological: Positive for tremors, weakness and numbness.  Psychiatric/Behavioral: Positive for dysphoric mood.  All other systems reviewed and are negative.       Objective:   Physical Exam  The patient is alert oriented cooperative pleasant ,affect is bright alert, answers questions appropriately.  Cranial nerves are grossly intact coordination is intact  Manual muscle testing of upper and lower extremities reveals 5 over 5 strength.  Sensory exam she reports decreased sensation around the area of the left knee mostly anteriorly but also in bilateral feet.   DTR in the upper and lower extremity are present and symmetric 2+, except pt deferred left patella tendon reflex, because of pain. No clonus is noted.  Gait stable  Minimal Difficulty  with tandem gait   Decreased range of motion lumbar spine only in extension  Full range of motion and knees without effusion, hypersensitive around left knee which is not new.  Straight leg raise is negative in bilateral lower extremities.  Minimal tenderness along trochanter and Illiotibial band today  internal and external rotation at the hips does not provoke groin or posterior hip pain.       Assessment & Plan:  1. History of chronic low back pain with a 2 month history of radiating pain down the left leg to the foot.    2. Chronic left knee pain status post partial knee replacement on the  left complicated by a complex regional pain syndrome, Dr. Percell Miller in  1999. Pain is worse now in left leg for last 2 months.   3. Narcotic medication monitoring:  Patient forgot to bring in pill bottle today. She was running late in just forgot. Typically her pill counts of than good and the she was reminded to make sure she brings her bottles into each visit. No problems with over sedation  Pt takes meds appropriately   4. Bilateral numbness and tingling in feet.new complaint. Consider EMG/NCV to eval for peripheral neuropathy. Pt will pursue this through PCP.  Plan:  Left leg pain in likely multifactoral.   Pt is reluctant about using more pain medication such as gabapentin or lyrica because of cognitive concerns.  She will consider lumbar ESI.  Morphine sulfate 15mg  tid #90. Continue will not need a refill until the end of this month.  At this time patient wants to think about whether she wants to pursue epidural steroid injection for leg pain.

## 2012-10-15 NOTE — Patient Instructions (Addendum)
Our clinic has been managing your pain related to a workers comp injury.  Today we discussed your lumbar MRI. Although you have some mild changes at most levels your biggest problem area is at L3-L4. You also have an old compression at L4.  You mentioned today you are having some numbness and tingling in your feet. Please let your PCP know about this. They may consider further testing.  Your medications should last until the end of this month.  Don't forget to bring in your pill bottles to each visit.

## 2012-11-12 ENCOUNTER — Encounter: Payer: Self-pay | Admitting: Physical Medicine and Rehabilitation

## 2012-11-12 ENCOUNTER — Encounter (HOSPITAL_BASED_OUTPATIENT_CLINIC_OR_DEPARTMENT_OTHER): Payer: Worker's Compensation | Admitting: Physical Medicine and Rehabilitation

## 2012-11-12 VITALS — BP 217/71 | HR 89 | Resp 14 | Ht 66.0 in | Wt 215.6 lb

## 2012-11-12 DIAGNOSIS — M25561 Pain in right knee: Secondary | ICD-10-CM

## 2012-11-12 DIAGNOSIS — M25569 Pain in unspecified knee: Secondary | ICD-10-CM

## 2012-11-12 DIAGNOSIS — Z5181 Encounter for therapeutic drug level monitoring: Secondary | ICD-10-CM

## 2012-11-12 DIAGNOSIS — G8929 Other chronic pain: Secondary | ICD-10-CM

## 2012-11-12 DIAGNOSIS — M545 Low back pain, unspecified: Secondary | ICD-10-CM

## 2012-11-12 DIAGNOSIS — Z760 Encounter for issue of repeat prescription: Secondary | ICD-10-CM | POA: Insufficient documentation

## 2012-11-12 MED ORDER — MORPHINE SULFATE 15 MG PO TABS: 15.0000 mg | ORAL_TABLET | Freq: Three times a day (TID) | ORAL | Status: DC

## 2012-11-12 NOTE — Progress Notes (Signed)
Subjective:    Patient ID: Paige Arnold, female    DOB: [birth year], 72 y.o.   MRN: [id number]  HPI  [name] is a pleasant 72 year old married woman who is followed  here at [facility] for chronic pain  complaints related to her left lower extremity. She also has some  complaints of back pain and right lateral hip pain.  She has a far lateral disk protrusion at L3-4 impacting her left L3 root  per MRI which was done back in November 2011.  She also has a history of partial knee replacement per Dr. Percell Miller in  Q000111Q which was complicated by history of complex regional pain syndrome.   She has a new problem with right knee pain.  Reports no injury.  No falls.  Came on a couple weeks ago.  No inciting incident that she can remember.  Today she has multiple other complaints(urinary sx, occasional dizziness, occasional headache, sweating intermittently, memory problems)  I have asked her to f/u with PCP.  She was last seen by me early December 2014 her pain was a 7 on a scale of 10.  She was seen by PA January 6 and her pain had increased to a 9 on a scale of 10. She reports she has weaned herself off of Topamax. She was concerned about side effects from this medication.    Today she states "I miss my Topamax"  She is concerned about side effects of Topamax, Gabapentin and Lyrica and does not wish to take these medications She continues to use her glider on a daily basis for 20 minutes.   She is here for a refill on her pain medications         Pain Inventory Average Pain 9 Pain Right Now 7 My pain is sharp, dull, stabbing and aching  In the last 24 hours, has pain interfered with the following? General activity 9 Relation with others 9 Enjoyment of life 10 What TIME of day is your pain at its worst? daytime and evening Sleep (in general) Poor  Pain is worse with: walking, bending, standing and some activites Pain improves with: rest and medication Relief from  Meds: 4  Mobility use a cane how many minutes can you walk? 10 ability to climb steps?  yes do you drive?  no  Function disabled: date disabled 1999 I need assistance with the following:  household duties and shopping  Neuro/Psych bladder control problems weakness numbness tingling trouble walking dizziness depression  Prior Studies Any changes since last visit?  no  Physicians involved in your care Any changes since last visit?  no   Family History  Problem Relation Age of Onset  . Kidney disease Mother   . Heart disease Father   . Diabetes Brother   . Alcohol abuse Brother   . Depression Brother   . Sarcoidosis Brother   . ALS Brother   . Heart disease Brother    History   Social History  . Marital Status: Married    Spouse Name: N/A    Number of Children: N/A  . Years of Education: N/A   Social History Main Topics  . Smoking status: Never Smoker   . Smokeless tobacco: Never Used  . Alcohol Use: No  . Drug Use: No  . Sexually Active: Not on file   Other Topics Concern  . Not on file   Social History Narrative  . No narrative on file   Past Surgical History  Procedure Laterality Date  . Tubal ligation    . Temporomandibular joint surgery    . Partial knee arthroplasty     Past Medical History  Diagnosis Date  . Thyroid disorder   . Kidney disease   . Arthritis   . Hypertension   . Diabetes mellitus    BP 217/71  Pulse 89  Resp 14  Ht 5\' 6"  (1.676 m)  Wt 215 lb 9.6 oz (97.796 kg)  BMI 34.82 kg/m2  SpO2 93%    Review of Systems  Constitutional: Positive for unexpected weight change.  Gastrointestinal: Positive for constipation.  Genitourinary: Positive for dysuria.       Bladder control problems  Musculoskeletal: Positive for gait problem.  Neurological: Positive for dizziness, weakness and numbness.       Tingling  Psychiatric/Behavioral: Positive for dysphoric mood.  All other systems reviewed and are negative.        Objective:   Physical Exam  The patient is alert oriented cooperative pleasant ,affect is bright alert, answers questions appropriately.  Cranial nerves are grossly intact coordination is intact  Manual muscle testing of upper and lower extremities reveals 5 over 5 strength.  Sensory exam she reports decreased sensation around the area of the left knee mostly anteriorly but also in bilateral feet.  DTR in the upper and lower extremity are present and symmetric 2+, except pt deferred left patella tendon reflex, because of pain. No clonus is noted.  Gait stable  Minimal Difficulty with tandem gait  Decreased range of motion lumbar spine only in extension  Full range of motion and knees without effusion, hypersensitive around left knee which is not new.  Straight leg raise is negative in bilateral lower extremities.  Minimal tenderness along trochanter and Illiotibial band today  internal and external rotation at the hips does not provoke groin or posterior hip pain.   Tenderness along medial joint line of right knee. Some tenderness noted into right pesanserine as well. Full range of motion is noted at the right knee. No effusion is appreciated. No AP and lateral instability is appreciated. Crepitus is noted with flexion and extension of right knee.       Assessment & Plan:  1. History of chronic low back pain with a 2 month history of radiating pain down the left leg to the foot.This has improve somewhat. Her back pain is the predominant problem again.   2. Chronic left knee pain status post partial knee replacement on the  left complicated by a complex regional pain syndrome, Dr. Percell Miller in  1999. Pain is worse now in left leg for last 2 months.   3. Narcotic medication monitoring:  Pill counts are appropriate No problems with over sedation  Pt takes meds appropriately   4. New Right knee pain.  Will check Xray. Plan:  Left leg pain in likely multifactoral.  Pt is reluctant  about using more pain medication such as gabapentin or lyrica because of cognitive concerns. She will consider lumbar ESI.    Morphine sulfate 15mg  tid #90.  Maintain contact with PCP for other problems.  F/u one month

## 2012-11-12 NOTE — Patient Instructions (Addendum)
These keep your pain medications locked up and in a secure location.  Right knee xray ordered  Continue to monitor your blood pressure and maintain contact with your other healthcare providers.  F/u one month

## 2012-11-17 ENCOUNTER — Ambulatory Visit
Admission: RE | Admit: 2012-11-17 | Discharge: 2012-11-17 | Disposition: A | Discharge status: Home / Self Care | Payer: Worker's Compensation | Source: Ambulatory Visit | Attending: Physical Medicine and Rehabilitation | Admitting: Physical Medicine and Rehabilitation

## 2012-11-17 DIAGNOSIS — M25561 Pain in right knee: Secondary | ICD-10-CM

## 2012-11-19 ENCOUNTER — Telehealth: Payer: Self-pay | Admitting: *Deleted

## 2012-11-19 NOTE — Telephone Encounter (Signed)
Message copied by Caro Hight on Wed Nov 19, 2012  9:13 AM ------      Message from: Terance Ice      Created: Wed Nov 19, 2012  8:30 AM       Xrays reviewed, please let pt know that arthritic changes were noted in her right knee. ------

## 2012-11-19 NOTE — Telephone Encounter (Signed)
[facility] was notified of xray report.

## 2012-12-05 ENCOUNTER — Telehealth: Payer: Self-pay | Admitting: Nurse Practitioner

## 2012-12-10 ENCOUNTER — Encounter: Payer: Self-pay | Admitting: Physical Medicine and Rehabilitation

## 2012-12-10 ENCOUNTER — Encounter
Payer: Worker's Compensation | Attending: Physical Medicine and Rehabilitation | Admitting: Physical Medicine and Rehabilitation

## 2012-12-10 VITALS — BP 146/74 | HR 84 | Resp 14 | Ht 66.0 in | Wt 212.6 lb

## 2012-12-10 DIAGNOSIS — Z5181 Encounter for therapeutic drug level monitoring: Secondary | ICD-10-CM

## 2012-12-10 DIAGNOSIS — I10 Essential (primary) hypertension: Secondary | ICD-10-CM | POA: Insufficient documentation

## 2012-12-10 DIAGNOSIS — Z79899 Other long term (current) drug therapy: Secondary | ICD-10-CM | POA: Insufficient documentation

## 2012-12-10 DIAGNOSIS — M25559 Pain in unspecified hip: Secondary | ICD-10-CM | POA: Insufficient documentation

## 2012-12-10 DIAGNOSIS — M79609 Pain in unspecified limb: Secondary | ICD-10-CM | POA: Insufficient documentation

## 2012-12-10 DIAGNOSIS — M25569 Pain in unspecified knee: Secondary | ICD-10-CM | POA: Insufficient documentation

## 2012-12-10 DIAGNOSIS — Z760 Encounter for issue of repeat prescription: Secondary | ICD-10-CM

## 2012-12-10 DIAGNOSIS — Z9089 Acquired absence of other organs: Secondary | ICD-10-CM | POA: Insufficient documentation

## 2012-12-10 DIAGNOSIS — G8929 Other chronic pain: Secondary | ICD-10-CM | POA: Insufficient documentation

## 2012-12-10 DIAGNOSIS — M545 Low back pain, unspecified: Secondary | ICD-10-CM | POA: Insufficient documentation

## 2012-12-10 DIAGNOSIS — E119 Type 2 diabetes mellitus without complications: Secondary | ICD-10-CM | POA: Insufficient documentation

## 2012-12-10 MED ORDER — LIDOCAINE 5 % EX PTCH: 1.0000 | MEDICATED_PATCH | CUTANEOUS | Status: DC

## 2012-12-10 MED ORDER — DICLOFENAC SODIUM 1 % TD GEL: 2.0000 g | Freq: Four times a day (QID) | TRANSDERMAL | Status: DC

## 2012-12-10 MED ORDER — MORPHINE SULFATE 15 MG PO TABS: 15.0000 mg | ORAL_TABLET | Freq: Three times a day (TID) | ORAL | Status: DC

## 2012-12-10 NOTE — Progress Notes (Signed)
Subjective:    Patient ID: Paige Arnold, female    DOB: 12/10/1940, 72 y.o.   MRN: [id number]  HPI Paige Arnold is a pleasant 72 year old married woman who is followed  here at [facility] for chronic pain  complaints related to her left lower extremity. She also has some  complaints of back pain and right lateral hip pain.  She has a far lateral disk protrusion at L3-4 impacting her left L3 root  per MRI which was done back in November 2011.  She also has a history of partial knee replacement per Dr. Percell Miller in  Q000111Q which was complicated by history of complex regional pain syndrome.   She underwent a cholecystectomy one week ago and was under the care of her surgeon who put her on  oxycodone which she takes twice a day in addition to her current morphine sulfate.  She follows up with him June 9. She states that he is aware of her other medications.  She is concerned about side effects of Topamax, Gabapentin and Lyrica and does not wish to take these medications   She had been using her glider on a daily basis for 20 minutes up until her recent cholecysectomy.  She is here for a refill on her pain medications   Pain Inventory Average Pain 10 Pain Right Now 10 My pain is sharp, dull, stabbing, tingling and aching  In the last 24 hours, has pain interfered with the following? General activity 10 Relation with others 10 Enjoyment of life 10 What TIME of day is your pain at its worst? all Sleep (in general) Poor  Pain is worse with: walking, bending, sitting, standing and some activites Pain improves with: rest, heat/ice and medication Relief from Meds: 1  Mobility use a walker ability to climb steps?  no do you drive?  no needs help with transfers  Function disabled: date disabled 1999 I need assistance with the following:  household duties and shopping  Neuro/Psych weakness tingling spasms dizziness confusion depression anxiety  Prior Studies Any changes  since last visit?  yes cholecystectomy last wednesday  Physicians involved in your care Any changes since last visit?  no   Family History  Problem Relation Age of Onset  . Kidney disease Mother   . Heart disease Father   . Diabetes Brother   . Alcohol abuse Brother   . Depression Brother   . Sarcoidosis Brother   . ALS Brother   . Heart disease Brother    History   Social History  . Marital Status: Married    Spouse Name: N/A    Number of Children: N/A  . Years of Education: N/A   Social History Main Topics  . Smoking status: Never Smoker   . Smokeless tobacco: Never Used  . Alcohol Use: No  . Drug Use: No  . Sexually Active: None   Other Topics Concern  . None   Social History Narrative  . None   Past Surgical History  Procedure Laterality Date  . Tubal ligation    . Temporomandibular joint surgery    . Partial knee arthroplasty    . Cholecystectomy     Past Medical History  Diagnosis Date  . Thyroid disorder   . Kidney disease   . Arthritis   . Hypertension   . Diabetes mellitus    BP 146/74  Pulse 84  Resp 14  Ht 5\' 6"  (1.676 m)  Wt 212 lb 9.6 oz (96.435 kg)  BMI 34.33 kg/m2  SpO2 94%    Review of Systems  Constitutional: Positive for unexpected weight change.  Respiratory: Positive for shortness of breath.   Genitourinary: Positive for dysuria.  Musculoskeletal: Positive for back pain, arthralgias and gait problem.       Spasms  Neurological: Positive for dizziness and weakness.       Tingling  Psychiatric/Behavioral: Positive for confusion and dysphoric mood. The patient is nervous/anxious.   All other systems reviewed and are negative.       Objective:   Physical Exam  The patient is alert oriented cooperative pleasant ,affect is bright alert, answers questions appropriately.  Cranial nerves are grossly intact coordination is intact  Manual muscle testing of upper and lower extremities reveals 5 over 5 strength.  Sensory exam  she reports decreased sensation around the area of the left knee mostly anteriorly but also in bilateral feet.  DTR in the upper and lower extremity are present and symmetric 2+, except pt deferred left patella tendon reflex, because of pain. No clonus is noted.  Gait stable  Minimal Difficulty with tandem gait  Decreased range of motion lumbar spine only in extension  Full range of motion and knees without effusion, hypersensitive around left knee which is not new.  Straight leg raise is negative in bilateral lower extremities.  Minimal tenderness along trochanter and Illiotibial band today  internal and external rotation at the hips does not provoke groin or posterior hip pain.  Tenderness along medial joint line of right knee. Some tenderness noted into right pesanserine as well.  Full range of motion is noted at the right knee. No effusion is appreciated. No AP and lateral instability is appreciated. Crepitus is noted with flexion and extension of right knee.        Assessment & Plan:  1. History of chronic low back pain with a 2 month history of radiating pain down the left leg to the foot.This has improve somewhat. Her back pain is the predominant problem again.  2. Chronic left knee pain status post partial knee replacement on the  left complicated by a complex regional pain syndrome, Dr. Percell Miller in  1999. Pain is worse now in left leg for last 2 months.  3. Narcotic medication monitoring:  Pill counts are appropriate  No problems with over sedation  Pt takes meds appropriately  4. New Right knee pain. Mild OA  Plan:  Left leg pain in likely multifactoral.  Pt is reluctant about using more pain medication such as gabapentin or lyrica because of cognitive concerns. She will consider lumbar ESI.  She will need to recover from her cholecystectomy. Morphine sulfate 15mg  tid #90.  Maintain contact with PCP for other problems.  F/u one month        Patient is status post  cholecystectomy last week. Had been on oxycodone per surgeons instructions in addition to morphine. Patient took not more than 2 oxycodone per day in addition to current morphine prescription for low back and leg pain.  Discussion regarding Topamax today. Patient reports worsens left leg pain after discontinuation of Topamax however does not wish to restart because of side effects.  Plan:  Lidoderm reordered for left knee neuropathic pain.  Voltaren gel reordered for chronic bilateral knee pain.  Refill morphine  Consider epidural steroid injection for left leg pain.  Follow up one month.

## 2012-12-10 NOTE — Patient Instructions (Signed)
I have read ordered your Lidoderm, Voltaren gel and your pain medication.   Please keep your pain medications locked up and in a secure location.  I have given you a brochure for epidural steroid injection.  Followup next month

## 2013-01-05 ENCOUNTER — Encounter: Payer: Self-pay | Admitting: Physical Medicine & Rehabilitation

## 2013-01-05 ENCOUNTER — Encounter: Payer: Worker's Compensation | Attending: Physical Medicine and Rehabilitation

## 2013-01-05 ENCOUNTER — Ambulatory Visit (HOSPITAL_BASED_OUTPATIENT_CLINIC_OR_DEPARTMENT_OTHER): Payer: Worker's Compensation | Admitting: Physical Medicine & Rehabilitation

## 2013-01-05 VITALS — BP 163/83 | HR 75 | Resp 14 | Ht 65.0 in | Wt 214.4 lb

## 2013-01-05 DIAGNOSIS — M79609 Pain in unspecified limb: Secondary | ICD-10-CM | POA: Insufficient documentation

## 2013-01-05 DIAGNOSIS — Z79899 Other long term (current) drug therapy: Secondary | ICD-10-CM

## 2013-01-05 DIAGNOSIS — M545 Low back pain, unspecified: Secondary | ICD-10-CM | POA: Insufficient documentation

## 2013-01-05 DIAGNOSIS — M25562 Pain in left knee: Secondary | ICD-10-CM

## 2013-01-05 DIAGNOSIS — G8929 Other chronic pain: Secondary | ICD-10-CM

## 2013-01-05 DIAGNOSIS — M25569 Pain in unspecified knee: Secondary | ICD-10-CM

## 2013-01-05 DIAGNOSIS — Z5181 Encounter for therapeutic drug level monitoring: Secondary | ICD-10-CM

## 2013-01-05 DIAGNOSIS — IMO0002 Reserved for concepts with insufficient information to code with codable children: Secondary | ICD-10-CM

## 2013-01-05 MED ORDER — MORPHINE SULFATE 15 MG PO TABS: 15.0000 mg | ORAL_TABLET | Freq: Three times a day (TID) | ORAL | Status: DC

## 2013-01-05 NOTE — Progress Notes (Signed)
Subjective:    Patient ID: Paige Arnold, female    DOB: 20-Jun-1941, 72 y.o.   MRN: [id number]  HPI Primary complaint is low back pain as well as left knee pain.  History of left L3-L4 disc herniation impinging upon the left L3 nerve root. In addition she has a history of partial knee replacement on the left side and chronic knee pain since that time 1999. History of complex regional pain syndrome as well. No recent trauma. No recent sweats or chills Cholecystectomy was performed in May and still has some residual abdominal pain. Has followed up with Gen. surgery and she has been released by them.  Normally uses elliptical however has not done so since cholecystectomy Pain Inventory Average Pain 6 Pain Right Now 6 My pain is sharp, dull, stabbing, tingling and aching  In the last 24 hours, has pain interfered with the following? General activity 8 Relation with others 8 Enjoyment of life 9 What TIME of day is your pain at its worst? daytime and evening Sleep (in general) Poor  Pain is worse with: walking, bending, standing and some activites Pain improves with: rest, heat/ice and medication Relief from Meds: 5  Mobility use a cane ability to climb steps?  yes do you drive?  no  Function disabled: date disabled 1999 I need assistance with the following:  meal prep, household duties and shopping  Neuro/Psych weakness numbness tingling dizziness anxiety  Prior Studies Any changes since last visit?  no  Physicians involved in your care Any changes since last visit?  no   Family History  Problem Relation Age of Onset  . Kidney disease Mother   . Heart disease Father   . Diabetes Brother   . Alcohol abuse Brother   . Depression Brother   . Sarcoidosis Brother   . ALS Brother   . Heart disease Brother    History   Social History  . Marital Status: Married    Spouse Name: N/A    Number of Children: N/A  . Years of Education: N/A   Social History Main  Topics  . Smoking status: Never Smoker   . Smokeless tobacco: Never Used  . Alcohol Use: No  . Drug Use: No  . Sexually Active: None   Other Topics Concern  . None   Social History Narrative  . None   Past Surgical History  Procedure Laterality Date  . Tubal ligation    . Temporomandibular joint surgery    . Partial knee arthroplasty    . Cholecystectomy     Past Medical History  Diagnosis Date  . Thyroid disorder   . Kidney disease   . Arthritis   . Hypertension   . Diabetes mellitus    BP 163/83  Pulse 75  Resp 14  Ht 5\' 5"  (1.651 m)  Wt 214 lb 6.4 oz (97.251 kg)  BMI 35.68 kg/m2  SpO2 99%    Review of Systems  Constitutional: Positive for chills and unexpected weight change.  Cardiovascular: Positive for leg swelling.  Gastrointestinal: Positive for constipation.  Genitourinary: Positive for dysuria.  Musculoskeletal: Positive for back pain and gait problem.  Neurological: Positive for dizziness, weakness and numbness.       Tingling  Psychiatric/Behavioral: The patient is nervous/anxious.   All other systems reviewed and are negative.       Objective:   Physical Exam  Nursing note and vitals reviewed. Constitutional: She is oriented to person, place, and time. She appears well-developed and  well-nourished.  HENT:  Head: Normocephalic and atraumatic.  Eyes: Conjunctivae and EOM are normal. Pupils are equal, round, and reactive to light.  Musculoskeletal:       Left knee: She exhibits decreased range of motion. Tenderness found. Lateral joint line tenderness noted.       Lumbar back: She exhibits decreased range of motion and tenderness. She exhibits no deformity.  Neurological: She is alert and oriented to person, place, and time. She has normal reflexes. A sensory deficit is present.  Able to identify light touch on left thigh however she does complain of paresthesias.  Motor strength is 4/5 bilateral quadriceps.  Psychiatric: She has a normal  mood and affect.          Assessment & Plan:  1. Chronic low back and left knee pain. I believe this is multifactorial. She has partial knee replacement and postoperative complex regional pain syndrome on the left side.  In addition she has left L3-L4 disc herniation with L3 nerve root impingement. Will perform left L3-L4 transforaminal injection and monitor response. This will be diagnostic as well as therapeutic. Schedule   Continue morphine sulfate. Did receive some oxycodone postoperatively from surgeon. May show up in urine screen as well.  Continue Lidoderm patch as well as Voltaren gel left knee

## 2013-01-05 NOTE — Patient Instructions (Signed)
Epidural Steroid Injection An epidural steroid injection is given to relieve pain in the neck, back, or legs. This procedure involves injecting a steroid and numbing medicine (local anesthetic) into the epidural space. The epidural space is the space between the outer covering of the spinal cord and the vertebra. The epidural steroid injection helps in reducing the pain that is caused by the irritation or swelling of the nerve root. However, it does not cure the underlying problem. The injection may be given for the following conditions:  Changes in the soft, gel-like cushion between two vertebrae (disk) due to wear and tear.  A reduction in the space within the spinal canal.  Slipped or herniated disk.  Low back (lumbar) sprain.  Sciatica. This is shooting pain that radiates down the buttocks and the back of the leg due to compression of the nerve.  Traumatic compression fracture of the vertebra.  Pain that develops after a surgery of the spine.  Pain that arises after an attack of viral infection affecting the nerves (shingles). LET YOUR CAREGIVER KNOW ABOUT:   Allergies to food or medicine.  Medicines taken, including vitamins, herbs, eyedrops, over-the-counter medicines, and creams.  Use of steroids (by mouth or creams).  Previous problems with anesthetics or numbing medicines.  History of bleeding problems or blood clots.  Previous surgery.  Other health problems, including diabetes and kidney problems.  Possibility of pregnancy, if this applies.     RISKS AND COMPLICATIONS The complications due to the needle insertion are:  Headache.  Bleeding.  Infection.  Allergic reaction to the medicines.  Damage to the nerves. The complications due to the steroid are:  Weight gain.  Hot flashes.  Mood swings.  Lack of sleep.  Increase in blood sugar levels, especially if you are diabetic.  Retention of water. The response to this procedure depends on the  underlying cause of the pain and its duration. Patients who have long-term (chronic) pain are less likely to benefit from epidural steroids than are patients whose pain comes on strong and suddenly. BEFORE THE PROCEDURE   The caregiver may ask about your symptoms, do a detailed exam, and advise some tests. These tests may include imaging studies. Your caregiver may review the results of your tests and discuss the procedure with you.  Ask your caregiver about changing or stopping your regular medicines. You may be advised to stop taking blood-thinning medicines a few days before the procedure.  You may also be given medicines to reduce your anxiety. PROCEDURE  You will remain awake during the whole procedure. Although, you may receive medicine to make you sleepy. You will be asked to lie on your stomach. The site of the injection is cleansed. Then, the injection site is numbed using a small amount of medicine that numbs the area (local anesthetic). A hollow needle is directed through your skin into the epidural space with the help of an X-ray. The X-ray helps to ensure that the steroid is delivered closest to the affected nerve. You may have some minimal discomfort at this time. Once the needle is in the right position, the local anesthetic and the steroid are injected into the epidural space. The needle is then removed. The skin is cleaned and a bandage is applied. The entire procedure takes only a few minutes, although repeated injections may be required (up to 3 to 4 injections over several weeks).  AFTER THE PROCEDURE   You may be monitored for a short time before you go home.  You may feel weakness or numbness in your arm or leg, which disappears within 1 to 2 hours.  Someone must drive you home or accompany you home if you are taking a taxi.  You may be allowed to eat, drink, and take your regular medicine.  Your pain may improve or worsen right after the procedure.  You may feel the  beneficial effect of the steroid a few days later.  You may have soreness at the site of the injection.  If you have only partial relief of the pain, the injection may be repeated once or even twice within 4 to 8 weeks of the initial injection. Document Released: 10/09/2007 Document Revised: 09/24/2011 Document Reviewed: 11/11/2008 [facility] Patient Information 2014 [place], [place].

## 2013-01-05 NOTE — Addendum Note (Signed)
Addended by: Amado Coe on: 01/05/2013 02:17 PM   Modules accepted: Orders

## 2013-01-20 ENCOUNTER — Other Ambulatory Visit: Payer: Self-pay | Admitting: Physical Medicine and Rehabilitation

## 2013-02-03 ENCOUNTER — Encounter: Payer: Self-pay | Admitting: Physical Medicine & Rehabilitation

## 2013-02-03 ENCOUNTER — Encounter: Payer: Worker's Compensation | Attending: Physical Medicine and Rehabilitation

## 2013-02-03 ENCOUNTER — Ambulatory Visit (HOSPITAL_BASED_OUTPATIENT_CLINIC_OR_DEPARTMENT_OTHER): Payer: Worker's Compensation | Admitting: Physical Medicine & Rehabilitation

## 2013-02-03 VITALS — BP 172/85 | HR 61 | Resp 14 | Ht 65.0 in | Wt 212.6 lb

## 2013-02-03 DIAGNOSIS — M5416 Radiculopathy, lumbar region: Secondary | ICD-10-CM

## 2013-02-03 DIAGNOSIS — M545 Low back pain, unspecified: Secondary | ICD-10-CM | POA: Insufficient documentation

## 2013-02-03 DIAGNOSIS — M79609 Pain in unspecified limb: Secondary | ICD-10-CM | POA: Insufficient documentation

## 2013-02-03 DIAGNOSIS — IMO0002 Reserved for concepts with insufficient information to code with codable children: Secondary | ICD-10-CM

## 2013-02-03 MED ORDER — MORPHINE SULFATE 15 MG PO TABS: 15.0000 mg | ORAL_TABLET | Freq: Three times a day (TID) | ORAL | Status: DC

## 2013-02-03 NOTE — Progress Notes (Signed)
Left Lumbar L3 transforaminal epidural steroid injection under fluoroscopic guidance  Indication: Lumbosacral radiculitis is not relieved by medication management or other conservative care and interfering with self-care and mobility.   Informed consent was obtained after describing risk and benefits of the procedure with the patient, this includes bleeding, bruising, infection, paralysis and medication side effects.  The patient wishes to proceed and has given written consent.  Patient was placed in prone position.  The lumbar area was marked and prepped with Betadine.  It was entered with a 25-gauge 1-1/2 inch needle and one mL of 1% lidocaine was injected into the skin and subcutaneous tissue.  Then a 22-gauge 3.5 inch spinal needle was inserted into the Left L3-4 intervertebral foramen under AP, lateral, and oblique view.  Then a solution containing one mL of 10 mg per mL dexamethasone and 2 mL of 1% lidocaine was injected.  The patient tolerated procedure well.  Post procedure instructions were given.  Please see post procedure form.

## 2013-02-03 NOTE — Patient Instructions (Addendum)

## 2013-02-03 NOTE — Progress Notes (Signed)
  [facility]   Name: CERIYAH NEARING DOB:[birth year] MRN: [id number]  Date:02/03/2013  Physician: Alysia Penna, MD    Nurse/CMA: Markell Schrier RN  Allergies:  Allergies  Allergen Reactions  . Sulfa Antibiotics Itching    Consent Signed: yes  Is patient diabetic? yes  CBG today? 205  Pregnant: no LMP: No LMP recorded. Patient is postmenopausal. (age 71-55)  Anticoagulants: no Anti-inflammatory: no Antibiotics: no  Procedure: left transforaminal Lumbar Epidural Steroid Inection L3 Position: Prone Start Time: 3:52 End Time: 4:01 Fluoro Time: 37 seconds  RN/CMA Biomedical engineer    Time 15:23 4:07    BP 172/85 196/69    Pulse 61 67    Respirations 14 14    O2 Sat 100 100    S/S 6 6    Pain Level 6/10 7/10     D/C home with husband, patient A & O X 3, D/C instructions reviewed, and sits independently.

## 2013-02-13 ENCOUNTER — Encounter
Payer: Worker's Compensation | Attending: Physical Medicine and Rehabilitation | Admitting: Physical Medicine and Rehabilitation

## 2013-02-13 ENCOUNTER — Other Ambulatory Visit: Payer: Self-pay | Admitting: Physical Medicine and Rehabilitation

## 2013-02-13 ENCOUNTER — Encounter: Payer: Self-pay | Admitting: Physical Medicine and Rehabilitation

## 2013-02-13 VITALS — BP 177/85 | HR 62 | Resp 16 | Ht 65.0 in | Wt 212.0 lb

## 2013-02-13 DIAGNOSIS — M25569 Pain in unspecified knee: Secondary | ICD-10-CM

## 2013-02-13 DIAGNOSIS — M25562 Pain in left knee: Secondary | ICD-10-CM

## 2013-02-13 DIAGNOSIS — Z96659 Presence of unspecified artificial knee joint: Secondary | ICD-10-CM | POA: Insufficient documentation

## 2013-02-13 DIAGNOSIS — Z96652 Presence of left artificial knee joint: Secondary | ICD-10-CM

## 2013-02-13 MED ORDER — MELOXICAM 7.5 MG PO TABS: 7.5000 mg | ORAL_TABLET | Freq: Every day | ORAL | Status: DC

## 2013-02-13 MED ORDER — METHOCARBAMOL 500 MG PO TABS: 500.0000 mg | ORAL_TABLET | Freq: Two times a day (BID) | ORAL | Status: DC | PRN

## 2013-02-13 NOTE — Progress Notes (Signed)
Subjective:    Patient ID: Paige Arnold, female    DOB: 1940-11-13, 72 y.o.   MRN: [id number]  HPI The patient is a 72 year old female female, who presents with low back and left knee pain . The symptoms started over 13 years ago. The patient complains about moderate pain , which radiate to the left lateral lower extremity down to the toes, intermittendly. Patient also complains about numbness and tingling in the same distribution . Applying heat, taking medications , changing positions alleviate the symptoms some. Prolonged standing aggrevates the symptoms. The patient grades her pain as a 9 /10. The patient complains about increased low back pain, since she had a left TF-ESI at L3, on 02/03/2013. She states that the injection did not help at all, it increased her LBP and her left knee pain.  She also states, that she would like to have a referral for an orthopedic surgeon for her increased left knee pain , where she had a partial knee replacement done.  Pain Inventory Average Pain 9 Pain Right Now 9 My pain is na  In the last 24 hours, has pain interfered with the following? General activity 10 Relation with others 10 Enjoyment of life 10 What TIME of day is your pain at its worst? constant Sleep (in general) NA  Pain is worse with: walking, bending, sitting, standing and some activites Pain improves with: pacing activities Relief from Meds: na  Mobility use a cane use a walker ability to climb steps?  yes do you drive?  no needs help with transfers Do you have any goals in this area?  no  Function disabled: date disabled 1999 I need assistance with the following:  meal prep, household duties and shopping Do you have any goals in this area?  no  Neuro/Psych weakness numbness tremor tingling trouble walking dizziness depression anxiety loss of taste or smell  Prior Studies Any changes since last visit?  yes  Physicians involved in your care Any changes since last  visit?  yes Primary care na   Family History  Problem Relation Age of Onset  . Kidney disease Mother   . Heart disease Father   . Diabetes Brother   . Alcohol abuse Brother   . Depression Brother   . Sarcoidosis Brother   . ALS Brother   . Heart disease Brother    History   Social History  . Marital Status: Married    Spouse Name: N/A    Number of Children: N/A  . Years of Education: N/A   Social History Main Topics  . Smoking status: Never Smoker   . Smokeless tobacco: Never Used  . Alcohol Use: No  . Drug Use: No  . Sexually Active: None   Other Topics Concern  . None   Social History Narrative  . None   Past Surgical History  Procedure Laterality Date  . Tubal ligation    . Temporomandibular joint surgery    . Partial knee arthroplasty    . Cholecystectomy     Past Medical History  Diagnosis Date  . Thyroid disorder   . Kidney disease   . Arthritis   . Hypertension   . Diabetes mellitus    BP 177/85  Pulse 62  Resp 16  Ht 5\' 5"  (1.651 m)  Wt 212 lb (96.163 kg)  BMI 35.28 kg/m2  SpO2 96%     Review of Systems  Constitutional: Positive for appetite change and unexpected weight change.  Respiratory:  Positive for apnea.   Gastrointestinal: Positive for nausea, abdominal pain and constipation.  Genitourinary: Positive for dysuria and difficulty urinating.  Neurological: Positive for dizziness, weakness and numbness.       Tingling  Psychiatric/Behavioral: Positive for dysphoric mood and agitation.  All other systems reviewed and are negative.       Objective:   Physical Exam Head: Normocephalic.  Neck: Normal range of motion.  Neurological: She is alert and oriented to person, place, and time.  Skin: Skin is warm and dry.  Psychiatric: She has a normal mood and affect.  Symmetric normal motor tone is noted throughout. Normal muscle bulk. Muscle testing reveals 5/5 muscle strength of the upper extremity, and 5/5 of the lower extremity.  Full range of motion in upper and lower extremities. ROM of spine is restricted.  DTR in the upper and lower extremity are present and symmetric 2+, except could not elicit left patella tendon reflex, because of pain. No clonus is noted.  Patient arises from chair with some difficulty. Wide based gait with cane. Walking on heels and toes, not tested because of safety reasons . Very tender to touch at left anterior knee.  Tender at mid low back bilateral, paraspinal muscles tense.        Assessment & Plan:  Chronic low back pain , MR I showed left lateral disc protrusion at L3. Increased LBP, after left TF-ESI at L3, on 02/03/13, advised patient to go to the ED if pain gets very severe over the weekend.  Chronic left knee pain, status post partial knee replacement on the left, also increased pain after the injection. Referral to orthopedic surgeon, for increased pain.  Advised patient to continue with her Mobic,7.5mg , one tablet a day, refilled this medication. Prescribed Robaxin 500mg  bid , prn muscle pain/spasms.  Advised patient to rest with her legs slightly elevated, but also try to continue with her exercises and her walking program, as pain permits. She should also continue her pain medication regimen. She has a follow up app. With Dr. Read Drivers

## 2013-02-13 NOTE — Patient Instructions (Signed)
Try to rest, and also try to stay as active as pain permits.

## 2013-02-13 NOTE — Telephone Encounter (Signed)
Ever since she got the [facility] with Dr Letta Pate on 02/03/13 she has been worse.  Cannot sit, stand or lie down comfortably and is just miserable. Nothing helps.  Meloxicam does a little for a little while.  Please call.  I spoke with her and we have an appointment at 2 pm. She will take that.

## 2013-02-19 ENCOUNTER — Telehealth: Payer: Self-pay

## 2013-02-19 NOTE — Telephone Encounter (Signed)
Patient needs a letter of medical necessity to get in home health.  Patients husband is unable to care for her.  Please advise if home health is appropriate.  If help is needed due to knee problems this is a [employer], operational.  If any other its under united health care.

## 2013-02-19 NOTE — Telephone Encounter (Signed)
Ordered evaluation by home health

## 2013-02-19 NOTE — Addendum Note (Signed)
Addended by: Aundria Mems on: 02/19/2013 03:55 PM   Modules accepted: Orders

## 2013-02-20 NOTE — Telephone Encounter (Signed)
Left message for family informing them we have ordered home health.

## 2013-02-26 ENCOUNTER — Ambulatory Visit (HOSPITAL_BASED_OUTPATIENT_CLINIC_OR_DEPARTMENT_OTHER): Payer: Worker's Compensation | Admitting: Physical Medicine & Rehabilitation

## 2013-02-26 ENCOUNTER — Encounter: Payer: Self-pay | Admitting: Physical Medicine & Rehabilitation

## 2013-02-26 VITALS — BP 171/122 | HR 93 | Resp 16 | Ht 65.0 in | Wt 217.0 lb

## 2013-02-26 DIAGNOSIS — E1142 Type 2 diabetes mellitus with diabetic polyneuropathy: Secondary | ICD-10-CM

## 2013-02-26 DIAGNOSIS — M48061 Spinal stenosis, lumbar region without neurogenic claudication: Secondary | ICD-10-CM

## 2013-02-26 DIAGNOSIS — E1140 Type 2 diabetes mellitus with diabetic neuropathy, unspecified: Secondary | ICD-10-CM | POA: Insufficient documentation

## 2013-02-26 DIAGNOSIS — M5137 Other intervertebral disc degeneration, lumbosacral region: Secondary | ICD-10-CM

## 2013-02-26 DIAGNOSIS — E1149 Type 2 diabetes mellitus with other diabetic neurological complication: Secondary | ICD-10-CM

## 2013-02-26 DIAGNOSIS — M51379 Other intervertebral disc degeneration, lumbosacral region without mention of lumbar back pain or lower extremity pain: Secondary | ICD-10-CM | POA: Insufficient documentation

## 2013-02-26 MED ORDER — GABAPENTIN 300 MG PO CAPS: 300.0000 mg | ORAL_CAPSULE | Freq: Every day | ORAL | Status: DC

## 2013-02-26 NOTE — Progress Notes (Signed)
Subjective:    Patient ID: Paige Arnold, female    DOB: [birth year], 72 y.o.   MRN: [id number]  HPI Left Lumbar L3 transforaminal epidural steroid injection under fluoroscopic guidance, No relief 7/22 Received muscle relaxers for  Back pain flare up Has been diagnosed with diabetic neuropathy Has nocturnal pain Pain Inventory Average Pain 10 Pain Right Now 8 My pain is constant and sharp  In the last 24 hours, has pain interfered with the following? General activity 10 Relation with others 10 Enjoyment of life 10 What TIME of day is your pain at its worst? constant Sleep (in general) Poor  Pain is worse with: walking and standing Pain improves with: rest and pacing activities Relief from Meds: 2  Mobility use a cane ability to climb steps?  yes do you drive?  no Do you have any goals in this area?  no  Function retired I need assistance with the following:  meal prep and household duties  Neuro/Psych bladder control problems weakness numbness tremor tingling trouble walking spasms dizziness confusion depression anxiety  Prior Studies Any changes since last visit?  no  Physicians involved in your care Any changes since last visit?  no   Family History  Problem Relation Age of Onset  . Kidney disease Mother   . Heart disease Father   . Diabetes Brother   . Alcohol abuse Brother   . Depression Brother   . Sarcoidosis Brother   . ALS Brother   . Heart disease Brother    History   Social History  . Marital Status: Married    Spouse Name: N/A    Number of Children: N/A  . Years of Education: N/A   Social History Main Topics  . Smoking status: Never Smoker   . Smokeless tobacco: Never Used  . Alcohol Use: No  . Drug Use: No  . Sexual Activity: None   Other Topics Concern  . None   Social History Narrative  . None   Past Surgical History  Procedure Laterality Date  . Tubal ligation    . Temporomandibular joint surgery    . Partial knee  arthroplasty    . Cholecystectomy     Past Medical History  Diagnosis Date  . Thyroid disorder   . Kidney disease   . Arthritis   . Hypertension   . Diabetes mellitus    BP 171/122  Pulse 93  Resp 16  Ht 5\' 5"  (1.651 m)  Wt 217 lb (98.431 kg)  BMI 36.11 kg/m2  SpO2 98%      Review of Systems  Musculoskeletal: Positive for back pain and gait problem.  Neurological: Positive for dizziness, tremors and numbness.  Psychiatric/Behavioral: Positive for confusion and dysphoric mood. The patient is nervous/anxious.   All other systems reviewed and are negative.       Objective:   Physical Exam  Nursing note and vitals reviewed. Constitutional: She is oriented to person, place, and time. She appears well-developed.  obese  HENT:  Head: Normocephalic and atraumatic.  Eyes: Conjunctivae and EOM are normal. Pupils are equal, round, and reactive to light.  Neurological: She is alert and oriented to person, place, and time. She has normal strength. She displays atrophy. No sensory deficit. She exhibits normal muscle tone.  Reflex Scores:      Patellar reflexes are 1+ on the right side and 1+ on the left side. Bilateral foot intrinsic muscle atrophy  Psychiatric: She has a normal mood and affect.  Assessment & Plan:  1. Lumbar disc with L3 nerve root impingement however injection was not helpful. Patient also has had partial knee replacement on left side and chronic pain. 2. Bilateral foot pain has been examined by podiatrist. Has symptoms of diabetic neuropathy. We'll try gabapentin at night 300 mg. Continue morphine, MS Contin 15 mg 3 times a day no signs of drug Misuse, last UDS  01/05/2013 was consistent Followup with Santiago Glad, may need to add daytime dose of gabapentin

## 2013-02-26 NOTE — Patient Instructions (Addendum)
We talked about lumbar facet injections which can be helpful for low back pain. There would be 6 injections. We can give you Valium prior to the procedure to reduce muscle spasm Discussed this with Santiago Glad next visit We are starting gabapentin for nerve pain. This is for diabetic neuropathy. We'll start at nighttime dose only but may need to add at the time dose next month if you do not have sufficient relief

## 2013-03-18 ENCOUNTER — Encounter: Payer: Self-pay | Admitting: Physical Medicine and Rehabilitation

## 2013-03-18 ENCOUNTER — Encounter
Payer: Worker's Compensation | Attending: Physical Medicine and Rehabilitation | Admitting: Physical Medicine and Rehabilitation

## 2013-03-18 VITALS — BP 162/82 | HR 70 | Resp 16 | Ht 65.0 in | Wt 222.0 lb

## 2013-03-18 DIAGNOSIS — G8929 Other chronic pain: Secondary | ICD-10-CM | POA: Insufficient documentation

## 2013-03-18 DIAGNOSIS — Z96659 Presence of unspecified artificial knee joint: Secondary | ICD-10-CM

## 2013-03-18 DIAGNOSIS — Z79899 Other long term (current) drug therapy: Secondary | ICD-10-CM | POA: Insufficient documentation

## 2013-03-18 DIAGNOSIS — I10 Essential (primary) hypertension: Secondary | ICD-10-CM | POA: Insufficient documentation

## 2013-03-18 DIAGNOSIS — Z96652 Presence of left artificial knee joint: Secondary | ICD-10-CM

## 2013-03-18 DIAGNOSIS — M47817 Spondylosis without myelopathy or radiculopathy, lumbosacral region: Secondary | ICD-10-CM

## 2013-03-18 DIAGNOSIS — M25569 Pain in unspecified knee: Secondary | ICD-10-CM | POA: Insufficient documentation

## 2013-03-18 DIAGNOSIS — M5126 Other intervertebral disc displacement, lumbar region: Secondary | ICD-10-CM | POA: Insufficient documentation

## 2013-03-18 MED ORDER — MORPHINE SULFATE 15 MG PO TABS: 15.0000 mg | ORAL_TABLET | Freq: Three times a day (TID) | ORAL | Status: DC

## 2013-03-18 MED ORDER — METHOCARBAMOL 500 MG PO TABS: 500.0000 mg | ORAL_TABLET | Freq: Three times a day (TID) | ORAL | Status: DC

## 2013-03-18 MED ORDER — DICLOFENAC SODIUM 1 % TD GEL: 2.0000 g | Freq: Four times a day (QID) | TRANSDERMAL | Status: DC

## 2013-03-18 MED ORDER — GABAPENTIN 300 MG PO CAPS: 300.0000 mg | ORAL_CAPSULE | Freq: Three times a day (TID) | ORAL | Status: DC

## 2013-03-18 NOTE — Progress Notes (Signed)
Subjective:    Patient ID: Paige Arnold, female    DOB: 1940-12-31, 72 y.o.   MRN: [id number]  HPI The patient is a 72 year old female female, who presents with low back and left knee pain . The symptoms started over 13 years ago. The patient complains about moderate pain , which radiate to the left lateral lower extremity down to the toes, intermittendly. Patient also complains about numbness and tingling in the same distribution . Applying heat, taking medications , changing positions alleviate the symptoms some. Prolonged standing aggrevates the symptoms. The patient grades her pain as a 9 /10. The patient complains about increased low back pain, since she had a left TF-ESI at L3, on 02/03/2013. She states that the injection did not help at all, it increased her LBP and her left knee pain. She also stated at her last visit, that she would like to have a referral for an orthopedic surgeon for her increased left knee pain , where she had a partial knee replacement done, I referred her at her last visit. She saw Dr. Percell Miller, who told her that she might have RSD  Pain Inventory Average Pain 5 Pain Right Now 6 My pain is unsure  In the last 24 hours, has pain interfered with the following? General activity 9 Relation with others 10 Enjoyment of life 10 What TIME of day is your pain at its worst? constant Sleep (in general) Fair  Pain is worse with: walking, bending, sitting, inactivity, standing and some activites Pain improves with: medication Relief from Meds: 3  Mobility walk with assistance use a cane use a walker how many minutes can you walk? 5 ability to climb steps?  no do you drive?  no needs help with transfers Do you have any goals in this area?  no  Function retired I need assistance with the following:  meal prep, household duties and shopping Do you have any goals in this area?  no  Neuro/Psych bladder control problems weakness numbness tingling trouble  walking dizziness depression anxiety  Prior Studies Any changes since last visit?  no  Physicians involved in your care Any changes since last visit?  no   Family History  Problem Relation Age of Onset  . Kidney disease Mother   . Heart disease Father   . Diabetes Brother   . Alcohol abuse Brother   . Depression Brother   . Sarcoidosis Brother   . ALS Brother   . Heart disease Brother    History   Social History  . Marital Status: Married    Spouse Name: N/A    Number of Children: N/A  . Years of Education: N/A   Social History Main Topics  . Smoking status: Never Smoker   . Smokeless tobacco: Never Used  . Alcohol Use: No  . Drug Use: No  . Sexual Activity: None   Other Topics Concern  . None   Social History Narrative  . None   Past Surgical History  Procedure Laterality Date  . Tubal ligation    . Temporomandibular joint surgery    . Partial knee arthroplasty    . Cholecystectomy     Past Medical History  Diagnosis Date  . Thyroid disorder   . Kidney disease   . Arthritis   . Hypertension   . Diabetes mellitus    BP 162/82  Pulse 70  Resp 16  Ht 5\' 5"  (1.651 m)  Wt 222 lb (100.699 kg)  BMI 36.94  kg/m2  SpO2 95%     Review of Systems  Constitutional: Positive for unexpected weight change.  Respiratory: Positive for cough.   Gastrointestinal: Positive for constipation.  Genitourinary: Positive for difficulty urinating.  Musculoskeletal: Positive for back pain and gait problem.  Neurological: Positive for weakness and numbness.  Psychiatric/Behavioral: Positive for dysphoric mood. The patient is nervous/anxious.   All other systems reviewed and are negative.       Objective:   Physical Exam Head: Normocephalic.  Neck: Normal range of motion.  Neurological: She is alert and oriented to person, place, and time.  Skin: Skin is warm and dry.  Psychiatric: She has a normal mood and affect.  Symmetric normal motor tone is noted  throughout. Normal muscle bulk. Muscle testing reveals 5/5 muscle strength of the upper extremity, and 5/5 of the lower extremity. Full range of motion in upper and lower extremities. ROM of spine is restricted.  DTR in the upper and lower extremity are present and symmetric 2+, except could not elicit left patella tendon reflex, because of pain. No clonus is noted.  Patient arises from chair with some difficulty. Wide based gait with cane. Walking on heels and toes, not tested because of safety reasons . Very tender to touch at left anterior knee.  Tender at mid low back bilateral, paraspinal muscles tense.        Assessment & Plan:  Chronic low back pain , MR I showed left lateral disc protrusion at L3. Increased LBP, after left TF-ESI at L3, on 02/03/13, advised patient to go to the ED if pain gets very severe over the weekend.  Chronic left knee pain, status post partial knee replacement on the left, also increased pain after the injection. Referral to orthopedic surgeon, for increased pain, she saw Dr. Percell Miller, who told her that she might have RSD. Increased her gabapentin, slowly to 300mg  tid. Advised patient to continue with her Mobic,7.5mg , one tablet a day, refilled this medication.  Increased Robaxin 500mg  bid ,to tid, prn muscle pain/spasms, because she complains about increase of her spasms.  Advised patient to rest with her legs slightly elevated, but also try to continue with her exercises and her walking program, as pain permits.  She states, that because of her Sx she is not able to clean her house, especially vacuum cleaning is too painful, she also reports that she is not able to cook anymore, because she can not stand for a prolonged time. Her husband has bilateral hip replacements, and is not able to help her. She would like to have an aid in her home. Dr. Read Drivers ordered a lift for her at her last visit, [place] told her that she needs a more specific Rx, she will call us with  those. She should also continue her pain medication regimen.  She has a follow up app. With Dr. Read Drivers

## 2013-03-18 NOTE — Patient Instructions (Signed)
Try to stay as active as tolerated 

## 2013-03-20 ENCOUNTER — Telehealth: Payer: Self-pay

## 2013-03-20 NOTE — Telephone Encounter (Signed)
Duplicate message. 

## 2013-03-20 NOTE — Telephone Encounter (Signed)
Patient called to give you the specifics for her lift chair::  [employer]: [employer] of the models

## 2013-03-25 ENCOUNTER — Encounter: Payer: Self-pay | Admitting: Endocrinology

## 2013-03-25 ENCOUNTER — Ambulatory Visit (INDEPENDENT_AMBULATORY_CARE_PROVIDER_SITE_OTHER): Payer: Medicare Other | Admitting: Endocrinology

## 2013-03-25 VITALS — BP 136/80 | HR 80 | Ht 65.0 in | Wt 224.0 lb

## 2013-03-25 DIAGNOSIS — I10 Essential (primary) hypertension: Secondary | ICD-10-CM | POA: Insufficient documentation

## 2013-03-25 DIAGNOSIS — E039 Hypothyroidism, unspecified: Secondary | ICD-10-CM

## 2013-03-25 MED ORDER — LEVOTHYROXINE SODIUM 75 MCG PO TABS: 75.0000 ug | ORAL_TABLET | Freq: Every day | ORAL | Status: DC

## 2013-03-25 NOTE — Patient Instructions (Addendum)
blood tests are being requested for you today.  We'll contact you with results. I would be happy to see you back here whenever you want.

## 2013-03-25 NOTE — Telephone Encounter (Signed)
She would have to contact a company which produces those chairs and initiate the process, talked to [place], she told me that this is done that way.

## 2013-03-25 NOTE — Progress Notes (Signed)
Subjective:    Patient ID: Paige Arnold, female    DOB: 24-Feb-1941, 72 y.o.   MRN: [id number]  HPI Pt states she was dx'ed with hypothyroidism in approx 1984.  She has slight hair loss on the head, in the context of reducing her synthroid, and assoc hoarseness.  She has been on her current dosage of 75 mcg/day, x a few years.  She feels the dosage should be much higher.  However, she ran out of synthroid 1 month ago.  Past Medical History  Diagnosis Date  . Thyroid disorder   . Kidney disease   . Arthritis   . Hypertension   . Diabetes mellitus    Past Surgical History  Procedure Laterality Date  . Tubal ligation    . Temporomandibular joint surgery    . Partial knee arthroplasty    . Cholecystectomy     History   Social History  . Marital Status: Married    Spouse Name: N/A    Number of Children: N/A  . Years of Education: N/A   Occupational History  . Not on file.   Social History Main Topics  . Smoking status: Never Smoker   . Smokeless tobacco: Never Used  . Alcohol Use: No  . Drug Use: No  . Sexual Activity: Not on file   Other Topics Concern  . Not on file   Social History Narrative  . No narrative on file    Current Outpatient Prescriptions on File Prior to Visit  Medication Sig Dispense Refill  . amLODipine (NORVASC) 2.5 MG tablet Take 2.5 mg by mouth daily.      . Biotin 1 MG CAPS Take by mouth.      Marland Kitchen BLACK COHOSH PO Take 40 mg by mouth daily.      . Calcium Carbonate-Vitamin D (CALTRATE 600+D PO) Take by mouth daily.      . Cholecalciferol (VITAMIN D3) 3000 UNITS TABS Take 1 tablet by mouth daily.      . Cyanocobalamin (VITAMIN B 12 PO) Take by mouth.      . diclofenac sodium (VOLTAREN) 1 % GEL Apply 2 g topically 4 (four) times daily.  2 Tube  2  . DULoxetine (CYMBALTA) 60 MG capsule Take 60 mg by mouth daily.      . folic acid (FOLVITE) 1 MG tablet Take 1 mg by mouth daily.      Marland Kitchen gabapentin (NEURONTIN) 300 MG capsule Take 1 capsule (300 mg total)  by mouth 3 (three) times daily.  90 capsule  1  . ibuprofen (ADVIL,MOTRIN) 600 MG tablet Take 600 mg by mouth every 8 (eight) hours as needed for pain.      Marland Kitchen levothyroxine (SYNTHROID, LEVOTHROID) 75 MCG tablet Take 75 mcg by mouth daily.      Marland Kitchen lidocaine (LIDODERM) 5 % Place 1 patch onto the skin daily. Remove & Discard patch within 12 hours or as directed by MD  30 patch  2  . meloxicam (MOBIC) 7.5 MG tablet Take 1 tablet (7.5 mg total) by mouth daily.  30 tablet  1  . methocarbamol (ROBAXIN) 500 MG tablet Take 1 tablet (500 mg total) by mouth 3 (three) times daily.  90 tablet  1  . morphine (MSIR) 15 MG tablet Take 1 tablet (15 mg total) by mouth 3 (three) times daily.  90 tablet  0  . Nutritional Supplements (DHEA PO) Take 50 mg by mouth daily.      Marland Kitchen nystatin-triamcinolone ointment (MYCOLOG)       .  pravastatin (PRAVACHOL) 20 MG tablet Take 20 mg by mouth daily.      Marland Kitchen PREMARIN vaginal cream       . ranitidine (ZANTAC) 300 MG tablet Take 300 mg by mouth at bedtime.      . sitaGLIPtin (JANUVIA) 100 MG tablet Take 100 mg by mouth daily.      . valsartan-hydrochlorothiazide (DIOVAN HCT) 320-25 MG per tablet Take 1 tablet by mouth daily.       No current facility-administered medications on file prior to visit.    Allergies  Allergen Reactions  . Sulfa Antibiotics Itching    Family History  Problem Relation Age of Onset  . Kidney disease Mother   . Heart disease Father   . Diabetes Brother   . Alcohol abuse Brother   . Depression Brother   . Sarcoidosis Brother   . ALS Brother   . Heart disease Brother   son has uncertain type of thyroid problem Mother had a goiter.  BP 136/80  Pulse 80  Ht 5\' 5"  (1.651 m)  Wt 224 lb (101.606 kg)  BMI 37.28 kg/m2  SpO2 98%  Review of Systems denies memory loss and syncope.  She has dry skin, easy bruising, insomnia, constipation, blurry vision, low-back pain, numbness of the feet, doe, depression, leg cramps, weight gain, and rhinorrhea.       Objective:   Physical Exam VS: see vs page GEN: no distress.  Morbid obesity HEAD: head: no deformity eyes: no periorbital swelling, no proptosis external nose and ears are normal mouth: no lesion seen NECK: supple, thyroid is not enlarged CHEST WALL: no deformity LUNGS:  Clear to auscultation CV: reg rate and rhythm, no murmur.  ABD: abdomen is soft, nontender.  no hepatosplenomegaly.  not distended.  no hernia MUSCULOSKELETAL: muscle bulk and strength are grossly normal.  no obvious joint swelling.  gait is steady with a cane.   EXTEMITIES: no deformity.  Trace bilat leg edema PULSES: dorsalis pedis intact bilat.  no carotid bruit.   NEURO:  cn 2-12 grossly intact.   readily moves all 4's.  sensation is intact to touch on all 4's SKIN:  Normal texture and temperature.  No rash or suspicious lesion is visible.   NODES:  None palpable at the neck. PSYCH: alert, oriented x3.  Does not appear anxious nor depressed.  Lab Results  Component Value Date   TSH 17.89* 03/25/2013      Assessment & Plan:  Chronic hypothyroidism: she needs to resume synthroid. Depression: could be exac by hypothyroidism. Painful neuropathy: this limits interpretation of sxs.

## 2013-04-15 ENCOUNTER — Encounter: Payer: Self-pay | Admitting: Physical Medicine and Rehabilitation

## 2013-04-15 ENCOUNTER — Encounter
Payer: Worker's Compensation | Attending: Physical Medicine and Rehabilitation | Admitting: Physical Medicine and Rehabilitation

## 2013-04-15 VITALS — BP 182/92 | HR 78 | Resp 14 | Ht 65.0 in | Wt 222.4 lb

## 2013-04-15 DIAGNOSIS — Z96652 Presence of left artificial knee joint: Secondary | ICD-10-CM

## 2013-04-15 DIAGNOSIS — Z79899 Other long term (current) drug therapy: Secondary | ICD-10-CM | POA: Insufficient documentation

## 2013-04-15 DIAGNOSIS — M545 Low back pain, unspecified: Secondary | ICD-10-CM | POA: Insufficient documentation

## 2013-04-15 DIAGNOSIS — M25569 Pain in unspecified knee: Secondary | ICD-10-CM | POA: Insufficient documentation

## 2013-04-15 DIAGNOSIS — G8929 Other chronic pain: Secondary | ICD-10-CM | POA: Insufficient documentation

## 2013-04-15 DIAGNOSIS — M47817 Spondylosis without myelopathy or radiculopathy, lumbosacral region: Secondary | ICD-10-CM

## 2013-04-15 DIAGNOSIS — I10 Essential (primary) hypertension: Secondary | ICD-10-CM | POA: Insufficient documentation

## 2013-04-15 DIAGNOSIS — Z96659 Presence of unspecified artificial knee joint: Secondary | ICD-10-CM

## 2013-04-15 DIAGNOSIS — E119 Type 2 diabetes mellitus without complications: Secondary | ICD-10-CM | POA: Insufficient documentation

## 2013-04-15 DIAGNOSIS — M5126 Other intervertebral disc displacement, lumbar region: Secondary | ICD-10-CM | POA: Insufficient documentation

## 2013-04-15 MED ORDER — MORPHINE SULFATE 15 MG PO TABS: 15.0000 mg | ORAL_TABLET | Freq: Three times a day (TID) | ORAL | Status: DC

## 2013-04-15 NOTE — Progress Notes (Signed)
Subjective:    Patient ID: Paige Arnold, female    DOB: 1940-08-09, 72 y.o.   MRN: [id number]  HPI The patient is a 72 year old female female, who presents with low back and left knee pain . The symptoms started over 13 years ago. The patient complains about moderate pain , which radiate to the left lateral lower extremity down to the toes, intermittendly. Patient also complains about numbness and tingling in the same distribution . Applying heat, taking medications , changing positions alleviate the symptoms some. Prolonged standing aggrevates the symptoms. The patient grades her pain as a 9 /10. The patient complains about increased low back pain, since she had a left TF-ESI at L3, on 02/03/2013. She states that the injection did not help at all, it increased her LBP and her left knee pain. She also stated at her last visit, that she would like to have a referral for an orthopedic surgeon for her increased left knee pain , where she had a partial knee replacement done, I referred her at her last visit. She saw Dr. Percell Miller, who told her that she might have RSD Today the patient is asking whether we could order a scooter for her to get around, she states that she has difficulty to get around.  Pain Inventory Average Pain 6 Pain Right Now 6 My pain is sharp, dull, stabbing and aching  In the last 24 hours, has pain interfered with the following? General activity 7 Relation with others 10 Enjoyment of life 10 What TIME of day is your pain at its worst? all day Sleep (in general) Fair  Pain is worse with: walking, bending, standing, some activites and other Pain improves with: rest, heat/ice and medication Relief from Meds: 5  Mobility walk with assistance use a cane ability to climb steps?  no do you drive?  no  Function disabled: date disabled 1999 retired I need assistance with the following:  household duties and shopping  Neuro/Psych weakness numbness tingling trouble  walking anxiety  Prior Studies Any changes since last visit?  no  Physicians involved in your care Any changes since last visit?  no   Family History  Problem Relation Age of Onset  . Kidney disease Mother   . Heart disease Father   . Diabetes Brother   . Alcohol abuse Brother   . Depression Brother   . Sarcoidosis Brother   . ALS Brother   . Heart disease Brother    History   Social History  . Marital Status: Married    Spouse Name: N/A    Number of Children: N/A  . Years of Education: N/A   Social History Main Topics  . Smoking status: Never Smoker   . Smokeless tobacco: Never Used  . Alcohol Use: No  . Drug Use: No  . Sexual Activity: None   Other Topics Concern  . None   Social History Narrative  . None   Past Surgical History  Procedure Laterality Date  . Tubal ligation    . Temporomandibular joint surgery    . Partial knee arthroplasty    . Cholecystectomy     Past Medical History  Diagnosis Date  . Thyroid disorder   . Kidney disease   . Arthritis   . Hypertension   . Diabetes mellitus    BP 182/92  Pulse 78  Resp 14  Ht 5\' 5"  (1.651 m)  Wt 222 lb 6.4 oz (100.88 kg)  BMI 37.01 kg/m2  SpO2  96%    Review of Systems  Constitutional: Positive for unexpected weight change.  Cardiovascular: Positive for leg swelling.  Gastrointestinal: Positive for abdominal pain.  Endocrine:       High blood sugar  Genitourinary: Positive for dysuria.  Musculoskeletal: Positive for gait problem.  Neurological: Positive for weakness and numbness.       Tingling  Psychiatric/Behavioral: The patient is nervous/anxious.   All other systems reviewed and are negative.       Objective:   Physical Exam Head: Normocephalic.  Neck: Normal range of motion.  Neurological: She is alert and oriented to person, place, and time.  Skin: Skin is warm and dry.  Psychiatric: She has a normal mood and affect.  Symmetric normal motor tone is noted throughout.  Normal muscle bulk. Muscle testing reveals 5/5 muscle strength of the upper extremity, and 5/5 of the lower extremity. Full range of motion in upper and lower extremities. ROM of spine is restricted.  DTR in the upper and lower extremity are present and symmetric 2+, except could not elicit left patella tendon reflex, because of pain. No clonus is noted.  Patient arises from chair with some difficulty. Wide based gait with cane. Walking on heels and toes, not tested because of safety reasons . Very tender to touch at left anterior knee.  Tender at mid low back bilateral, paraspinal muscles tense.        Assessment & Plan:  Chronic low back pain , MR I showed left lateral disc protrusion at L3. Increased LBP, after left TF-ESI at L3, on 02/03/13.  Chronic left knee pain, status post partial knee replacement on the left, also increased pain after the injection. Referral to orthopedic surgeon, for increased pain, she saw Dr. Percell Miller, who told her that she might have RSD. Increased her gabapentin, slowly to 300mg  tid.  Advised patient to continue with her Mobic,7.5mg , one tablet a day, refilled this medication.  Increased Robaxin 500mg  bid ,to tid, prn muscle pain/spasms, because she complains about increase of her spasms.  Advised patient to rest with her legs slightly elevated, but also try to continue with her exercises and her walking program, as pain permits.  She states, that because of her Sx she is not able to clean her house, especially vacuum cleaning is too painful, she also reports that she is not able to cook anymore, because she can not stand for a prolonged time. Her husband has bilateral hip replacements, and is not able to help her. She would like to have an aid in her home.  Dr. Read Drivers ordered a lift for her at her last visit, [place] told her that she needs a more specific Rx, I ordered [employer] to evaluate her specific needs for the chair, she states that she already bought one, but would like  to get it reimburst  . She has difficulties to get up from a regular chair. She should also continue her pain medication regimen.  Recommended aquatic PT, but patient states that she tried to get that approved from [facility] before and they did not approve this. Patient stated that she would like an order for a scooter, to be able to get araound more, because her walking is very restricted, she can not walk for a prolonged time, without increased Sx. Follow up in one month

## 2013-04-15 NOTE — Patient Instructions (Signed)
Stay as active as tolerated. 

## 2013-04-23 ENCOUNTER — Telehealth: Payer: Self-pay | Admitting: *Deleted

## 2013-04-23 NOTE — Telephone Encounter (Signed)
Pt called stating she did not know when she is suppose to come back. She has only 1 more refill of her medication. Please advise to when she needs to return. [phone])

## 2013-04-23 NOTE — Telephone Encounter (Signed)
It is fine with me to leave it open.  I would be happy to see you back here whenever you want.

## 2013-04-24 NOTE — Telephone Encounter (Signed)
Pt advised.

## 2013-05-18 ENCOUNTER — Encounter
Payer: Worker's Compensation | Attending: Physical Medicine and Rehabilitation | Admitting: Physical Medicine and Rehabilitation

## 2013-05-18 ENCOUNTER — Encounter: Payer: Self-pay | Admitting: Physical Medicine and Rehabilitation

## 2013-05-18 VITALS — BP 200/89 | HR 84 | Resp 14 | Ht 65.0 in | Wt 226.0 lb

## 2013-05-18 DIAGNOSIS — M47817 Spondylosis without myelopathy or radiculopathy, lumbosacral region: Secondary | ICD-10-CM

## 2013-05-18 DIAGNOSIS — E1149 Type 2 diabetes mellitus with other diabetic neurological complication: Secondary | ICD-10-CM

## 2013-05-18 DIAGNOSIS — G8929 Other chronic pain: Secondary | ICD-10-CM | POA: Insufficient documentation

## 2013-05-18 DIAGNOSIS — M79609 Pain in unspecified limb: Secondary | ICD-10-CM | POA: Insufficient documentation

## 2013-05-18 DIAGNOSIS — E1140 Type 2 diabetes mellitus with diabetic neuropathy, unspecified: Secondary | ICD-10-CM

## 2013-05-18 DIAGNOSIS — IMO0001 Reserved for inherently not codable concepts without codable children: Secondary | ICD-10-CM | POA: Insufficient documentation

## 2013-05-18 DIAGNOSIS — M62838 Other muscle spasm: Secondary | ICD-10-CM | POA: Insufficient documentation

## 2013-05-18 DIAGNOSIS — Z96652 Presence of left artificial knee joint: Secondary | ICD-10-CM

## 2013-05-18 DIAGNOSIS — M545 Low back pain, unspecified: Secondary | ICD-10-CM | POA: Insufficient documentation

## 2013-05-18 DIAGNOSIS — Z5181 Encounter for therapeutic drug level monitoring: Secondary | ICD-10-CM

## 2013-05-18 DIAGNOSIS — E1142 Type 2 diabetes mellitus with diabetic polyneuropathy: Secondary | ICD-10-CM

## 2013-05-18 DIAGNOSIS — Z79899 Other long term (current) drug therapy: Secondary | ICD-10-CM

## 2013-05-18 DIAGNOSIS — M25569 Pain in unspecified knee: Secondary | ICD-10-CM | POA: Insufficient documentation

## 2013-05-18 DIAGNOSIS — Z96659 Presence of unspecified artificial knee joint: Secondary | ICD-10-CM

## 2013-05-18 MED ORDER — MORPHINE SULFATE 15 MG PO TABS: 15.0000 mg | ORAL_TABLET | Freq: Three times a day (TID) | ORAL | Status: DC

## 2013-05-18 NOTE — Patient Instructions (Signed)
Stay as active as tolerated. 

## 2013-05-18 NOTE — Progress Notes (Signed)
Subjective:    Patient ID: Paige Arnold, female    DOB: 1940/10/25, 72 y.o.   MRN: [id number]  HPI The patient is a 72 year old female female, who presents with low back and left knee pain . The symptoms started over 13 years ago. The patient complains about moderate pain , which radiate to the left lateral lower extremity down to the toes, intermittendly. Patient also complains about numbness and tingling in the same distribution . Applying heat, taking medications , changing positions alleviate the symptoms some. Prolonged standing aggrevates the symptoms. The patient grades her pain as a 9 /10. The patient complains about increased low back pain, since she had a left TF-ESI at L3, on 02/03/2013. She states that the injection did not help at all, it increased her LBP and her left knee pain.  She saw Dr. Percell Miller, who told her that she might have RSD    Pain Inventory Average Pain 5 Pain Right Now 5 My pain is sharp, dull, stabbing, tingling and aching  In the last 24 hours, has pain interfered with the following? General activity 1 Relation with others 1 Enjoyment of life 0 What TIME of day is your pain at its worst? evening Sleep (in general) Fair  Pain is worse with: walking, bending, standing and some activites Pain improves with: rest, heat/ice and medication Relief from Meds: 5  Mobility use a cane Do you have any goals in this area?  no  Function retired Do you have any goals in this area?  no  Neuro/Psych weakness tingling trouble walking confusion depression  Prior Studies Any changes since last visit?  no  Physicians involved in your care Any changes since last visit?  no   Family History  Problem Relation Age of Onset  . Kidney disease Mother   . Heart disease Father   . Diabetes Brother   . Alcohol abuse Brother   . Depression Brother   . Sarcoidosis Brother   . ALS Brother   . Heart disease Brother    History   Social History  . Marital Status:  Married    Spouse Name: N/A    Number of Children: N/A  . Years of Education: N/A   Social History Main Topics  . Smoking status: Never Smoker   . Smokeless tobacco: Never Used  . Alcohol Use: No  . Drug Use: No  . Sexual Activity: None   Other Topics Concern  . None   Social History Narrative  . None   Past Surgical History  Procedure Laterality Date  . Tubal ligation    . Temporomandibular joint surgery    . Partial knee arthroplasty    . Cholecystectomy     Past Medical History  Diagnosis Date  . Thyroid disorder   . Kidney disease   . Arthritis   . Hypertension   . Diabetes mellitus    BP 200/89  Pulse 84  Resp 14  Ht 5\' 5"  (1.651 m)  Wt 226 lb (102.513 kg)  BMI 37.61 kg/m2  SpO2 94%     Review of Systems  Constitutional: Positive for unexpected weight change.  Gastrointestinal: Positive for constipation.  Genitourinary: Positive for difficulty urinating.  Musculoskeletal: Positive for back pain and gait problem.  Neurological: Positive for weakness.  Psychiatric/Behavioral: Positive for confusion and dysphoric mood.  All other systems reviewed and are negative.       Objective:   Physical Exam Head: Normocephalic.  Neck: Normal range of motion.  Neurological: She is alert and oriented to person, place, and time.  Skin: Skin is warm and dry.  Psychiatric: She has a normal mood and affect.  Symmetric normal motor tone is noted throughout. Normal muscle bulk. Muscle testing reveals 5/5 muscle strength of the upper extremity, and 5/5 of the lower extremity. Full range of motion in upper and lower extremities. ROM of spine is restricted.  DTR in the upper and lower extremity are present and symmetric 2+, except could not elicit left patella tendon reflex, because of pain. No clonus is noted.  Patient arises from chair with some difficulty. Wide based gait with cane. Walking on heels and toes, not tested because of safety reasons . Very tender to touch  at left anterior knee.  Tender at mid low back bilateral, paraspinal muscles tense.        Assessment & Plan:  Chronic low back pain , MR I showed left lateral disc protrusion at L3. Increased LBP, after left TF-ESI at L3, on 02/03/13.  Chronic left knee pain, status post partial knee replacement on the left, also increased pain after the injection. Referral to orthopedic surgeon, for increased pain, she saw Dr. Percell Miller, who told her that she might have RSD. Increased her gabapentin, slowly to 300mg  tid.  Advised patient to continue with her Mobic,7.5mg , one tablet a day.  Continue with Robaxin 500mg  tid, prn muscle pain/spasms, because she complains about increase of her spasms.  Advised patient to rest with her legs slightly elevated, but also try to continue with her exercises and her walking program, as pain permits.  She states, that because of her Sx she is not able to clean her house, especially vacuum cleaning is too painful, she also reports that she is not able to cook anymore, because she can not stand for a prolonged time. Her husband has bilateral hip replacements, and is not able to help her. She would like to have an aid in her home.  Dr. Read Drivers ordered a lift for her at her last visit, [place] told her that she needs a more specific Rx, I ordered [employer] to evaluate her specific needs for the chair, she states that she already bought one . She has difficulties to get up from a regular chair.  She should also continue her pain medication regimen, refilled her MSRI 15mg  tid,#90.  Recommended aquatic PT, but patient states that she tried to get that approved from [facility] before and they did not approve this.  Patient stated that she would like an order for a scooter, to be able to get araound more, because her walking is very restricted, she can not walk for a prolonged time, without increased Sx.  The patient did not bring her medication bottles today, this has happened more than once in the past,  I educated the patient that she has to bring them every visit, if she does not bring them again we will not fill her meds.  Follow up in one month

## 2013-05-28 ENCOUNTER — Other Ambulatory Visit: Payer: Self-pay | Admitting: Physical Medicine and Rehabilitation

## 2013-06-16 ENCOUNTER — Encounter: Payer: Self-pay | Admitting: Physical Medicine and Rehabilitation

## 2013-06-16 ENCOUNTER — Encounter
Payer: Worker's Compensation | Attending: Physical Medicine and Rehabilitation | Admitting: Physical Medicine and Rehabilitation

## 2013-06-16 VITALS — BP 145/69 | HR 78 | Resp 14 | Ht 65.0 in | Wt 228.2 lb

## 2013-06-16 DIAGNOSIS — E119 Type 2 diabetes mellitus without complications: Secondary | ICD-10-CM | POA: Insufficient documentation

## 2013-06-16 DIAGNOSIS — M47817 Spondylosis without myelopathy or radiculopathy, lumbosacral region: Secondary | ICD-10-CM

## 2013-06-16 DIAGNOSIS — G8929 Other chronic pain: Secondary | ICD-10-CM | POA: Insufficient documentation

## 2013-06-16 DIAGNOSIS — M545 Low back pain, unspecified: Secondary | ICD-10-CM | POA: Insufficient documentation

## 2013-06-16 DIAGNOSIS — M5126 Other intervertebral disc displacement, lumbar region: Secondary | ICD-10-CM | POA: Insufficient documentation

## 2013-06-16 DIAGNOSIS — M25569 Pain in unspecified knee: Secondary | ICD-10-CM | POA: Insufficient documentation

## 2013-06-16 DIAGNOSIS — I10 Essential (primary) hypertension: Secondary | ICD-10-CM | POA: Insufficient documentation

## 2013-06-16 MED ORDER — MORPHINE SULFATE 15 MG PO TABS: 15.0000 mg | ORAL_TABLET | Freq: Three times a day (TID) | ORAL | Status: DC

## 2013-06-16 NOTE — Patient Instructions (Signed)
Try to stay active

## 2013-06-16 NOTE — Progress Notes (Signed)
Subjective:    Patient ID: Paige Arnold, female    DOB: 03-Aug-1940, 72 y.o.   MRN: [id number]  HPI The patient is a 72 year old female female, who presents with low back and left knee pain . The symptoms started over 13 years ago. The patient complains about moderate pain , which radiate to the left lateral lower extremity down to the toes, intermittendly. Patient also complains about numbness and tingling in the same distribution . Applying heat, taking medications , changing positions alleviate the symptoms some. Prolonged standing aggrevates the symptoms. The patient grades her pain as a 5 /10. She had a left TF-ESI at L3, on 02/03/2013. She states that the injection did not help at all, it increased her LBP and her left knee pain.Today her pain is back to baseline, she states, her problem has been stable.  She saw Dr. Percell Miller, who told her that she might have RSD    Pain Inventory Average Pain 5 Pain Right Now 5 My pain is constant, sharp, burning, dull and stabbing  In the last 24 hours, has pain interfered with the following? General activity 9 Relation with others 10 Enjoyment of life 10 What TIME of day is your pain at its worst? all Sleep (in general) Poor  Pain is worse with: walking, bending, sitting and standing Pain improves with: rest and heat/ice Relief from Meds: 5  Mobility walk with assistance use a cane ability to climb steps?  yes do you drive?  no  Function disabled: date disabled . I need assistance with the following:  household duties and shopping  Neuro/Psych weakness dizziness confusion anxiety  Prior Studies Any changes since last visit?  no  Physicians involved in your care Any changes since last visit?  no   Family History  Problem Relation Age of Onset  . Kidney disease Mother   . Heart disease Father   . Diabetes Brother   . Alcohol abuse Brother   . Depression Brother   . Sarcoidosis Brother   . ALS Brother   . Heart disease  Brother    History   Social History  . Marital Status: Married    Spouse Name: N/A    Number of Children: N/A  . Years of Education: N/A   Social History Main Topics  . Smoking status: Never Smoker   . Smokeless tobacco: Never Used  . Alcohol Use: No  . Drug Use: No  . Sexual Activity: None   Other Topics Concern  . None   Social History Narrative  . None   Past Surgical History  Procedure Laterality Date  . Tubal ligation    . Temporomandibular joint surgery    . Partial knee arthroplasty    . Cholecystectomy     Past Medical History  Diagnosis Date  . Thyroid disorder   . Kidney disease   . Arthritis   . Hypertension   . Diabetes mellitus    BP 145/69  Pulse 78  Resp 14  Ht 5\' 5"  (1.651 m)  Wt 228 lb 3.2 oz (103.511 kg)  BMI 37.97 kg/m2  SpO2 96%   Review of Systems  Constitutional: Positive for unexpected weight change.  Cardiovascular: Positive for leg swelling.  Gastrointestinal: Positive for constipation.  Genitourinary: Positive for dysuria.  Musculoskeletal: Positive for back pain.  Neurological: Positive for dizziness and weakness.  Psychiatric/Behavioral: Positive for confusion. The patient is nervous/anxious.   All other systems reviewed and are negative.  Objective:   Physical Exam Head: Normocephalic.  Neck: Normal range of motion.  Neurological: She is alert and oriented to person, place, and time.  Skin: Skin is warm and dry.  Psychiatric: She has a normal mood and affect.  Symmetric normal motor tone is noted throughout. Normal muscle bulk. Muscle testing reveals 5/5 muscle strength of the upper extremity, and 5/5 of the lower extremity. Full range of motion in upper and lower extremities. ROM of spine is restricted.  DTR in the upper and lower extremity are present and symmetric 2+, except could not elicit left patella tendon reflex, because of pain. No clonus is noted.  Patient arises from chair with some difficulty. Wide  based gait with cane. Walking on heels and toes, not tested because of safety reasons . Very tender to touch at left anterior knee.  Tender at mid low back bilateral, paraspinal muscles tense.        Assessment & Plan:  Chronic low back pain , MR I showed left lateral disc protrusion at L3. Increased LBP, after left TF-ESI at L3, on 02/03/13, patient is back to baseline concerning her LBP.  Chronic left knee pain, status post partial knee replacement on the left, also increased pain after the injection. Referral to orthopedic surgeon, for increased pain, she saw Dr. Percell Miller, who told her that she might have RSD.   Continue with gabapentin,  300mg  tid.  Advised patient to continue with her Mobic,7.5mg , one tablet a day.  Continue with Robaxin 500mg  tid, prn muscle pain/spasms, because she complains about increase of her spasms.  Advised patient to rest with her legs slightly elevated, but also try to continue with her exercises and her walking program, as pain permits.    She should also continue her pain medication regimen, refilled her MSRI 15mg  tid,#90.  Recommended aquatic PT, but patient states that she tried to get that approved from [facility] before and they did not approve this.  Patient stated that she would like an order for a scooter, to be able to get araound more, because her walking is very restricted, she can not walk for a prolonged time, without increased Sx.  The patient did not bring her medication bottles today, this has happened more than once in the past, I educated the patient that she has to bring them every visit, if she does not bring them again we will not fill her meds.  Follow up in one month

## 2013-07-06 ENCOUNTER — Other Ambulatory Visit: Payer: Self-pay | Admitting: *Deleted

## 2013-07-06 MED ORDER — MORPHINE SULFATE 15 MG PO TABS: 15.0000 mg | ORAL_TABLET | Freq: Three times a day (TID) | ORAL | Status: DC

## 2013-07-06 NOTE — Telephone Encounter (Signed)
RX printed early for controlled medication for the visit with RN on 07/17/13 (to be signed by MD) 

## 2013-07-17 ENCOUNTER — Encounter: Payer: Self-pay | Admitting: *Deleted

## 2013-07-17 ENCOUNTER — Encounter: Payer: Worker's Compensation | Attending: Physical Medicine & Rehabilitation | Admitting: *Deleted

## 2013-07-17 VITALS — HR 75 | Resp 14

## 2013-07-17 DIAGNOSIS — M25569 Pain in unspecified knee: Secondary | ICD-10-CM | POA: Insufficient documentation

## 2013-07-17 DIAGNOSIS — M5126 Other intervertebral disc displacement, lumbar region: Secondary | ICD-10-CM | POA: Diagnosis not present

## 2013-07-17 DIAGNOSIS — G8929 Other chronic pain: Secondary | ICD-10-CM | POA: Insufficient documentation

## 2013-07-17 DIAGNOSIS — I10 Essential (primary) hypertension: Secondary | ICD-10-CM | POA: Diagnosis not present

## 2013-07-17 DIAGNOSIS — M5137 Other intervertebral disc degeneration, lumbosacral region: Secondary | ICD-10-CM

## 2013-07-17 DIAGNOSIS — Z79899 Other long term (current) drug therapy: Secondary | ICD-10-CM | POA: Diagnosis not present

## 2013-07-17 DIAGNOSIS — Z96659 Presence of unspecified artificial knee joint: Secondary | ICD-10-CM | POA: Insufficient documentation

## 2013-07-17 DIAGNOSIS — E119 Type 2 diabetes mellitus without complications: Secondary | ICD-10-CM | POA: Diagnosis not present

## 2013-07-17 DIAGNOSIS — E1140 Type 2 diabetes mellitus with diabetic neuropathy, unspecified: Secondary | ICD-10-CM

## 2013-07-17 DIAGNOSIS — M25561 Pain in right knee: Secondary | ICD-10-CM

## 2013-07-17 DIAGNOSIS — M48061 Spinal stenosis, lumbar region without neurogenic claudication: Secondary | ICD-10-CM

## 2013-07-17 NOTE — Progress Notes (Signed)
Here for pill count and medication refills. MSIR 15 mg (1tid) #90  Fill date 07/08/13   Today NV# 54  Pill count appropriate an did bring bottle today.  VSS    Pain level:6  No changes in pain level or medication list.  Opioid risk score done today and it was 3.  Return  To see RN for med refill and pill count.  Two month follow up with Dr Letta Pate  and Jeannene Patella .

## 2013-07-17 NOTE — Patient Instructions (Addendum)
Follow up one month with RN for pill count and med refill  Two months with Dr Letta Pate or Algis Liming PA

## 2013-07-31 ENCOUNTER — Other Ambulatory Visit: Payer: Self-pay

## 2013-07-31 MED ORDER — METHOCARBAMOL 500 MG PO TABS: ORAL_TABLET | ORAL | Status: DC

## 2013-08-10 ENCOUNTER — Other Ambulatory Visit: Payer: Self-pay

## 2013-08-10 MED ORDER — MELOXICAM 7.5 MG PO TABS: 7.5000 mg | ORAL_TABLET | Freq: Every day | ORAL | Status: DC

## 2013-08-11 ENCOUNTER — Other Ambulatory Visit: Payer: Self-pay | Admitting: *Deleted

## 2013-08-11 MED ORDER — MORPHINE SULFATE 15 MG PO TABS: 15.0000 mg | ORAL_TABLET | Freq: Three times a day (TID) | ORAL | Status: DC

## 2013-08-11 NOTE — Telephone Encounter (Signed)
RX printed early for controlled medication for the visit with RN on 08/18/13 (to be signed by MD)

## 2013-08-18 ENCOUNTER — Encounter: Payer: Worker's Compensation | Attending: Physical Medicine & Rehabilitation | Admitting: *Deleted

## 2013-08-18 ENCOUNTER — Encounter: Payer: Self-pay | Admitting: *Deleted

## 2013-08-18 VITALS — BP 127/70 | HR 79 | Resp 14

## 2013-08-18 DIAGNOSIS — R209 Unspecified disturbances of skin sensation: Secondary | ICD-10-CM | POA: Insufficient documentation

## 2013-08-18 DIAGNOSIS — M25561 Pain in right knee: Secondary | ICD-10-CM

## 2013-08-18 DIAGNOSIS — M545 Low back pain, unspecified: Secondary | ICD-10-CM | POA: Insufficient documentation

## 2013-08-18 DIAGNOSIS — E119 Type 2 diabetes mellitus without complications: Secondary | ICD-10-CM | POA: Diagnosis not present

## 2013-08-18 DIAGNOSIS — M25569 Pain in unspecified knee: Secondary | ICD-10-CM | POA: Insufficient documentation

## 2013-08-18 DIAGNOSIS — M47817 Spondylosis without myelopathy or radiculopathy, lumbosacral region: Secondary | ICD-10-CM | POA: Diagnosis present

## 2013-08-18 DIAGNOSIS — Z79899 Other long term (current) drug therapy: Secondary | ICD-10-CM | POA: Insufficient documentation

## 2013-08-18 DIAGNOSIS — M5126 Other intervertebral disc displacement, lumbar region: Secondary | ICD-10-CM | POA: Diagnosis not present

## 2013-08-18 DIAGNOSIS — I10 Essential (primary) hypertension: Secondary | ICD-10-CM | POA: Insufficient documentation

## 2013-08-18 DIAGNOSIS — M48061 Spinal stenosis, lumbar region without neurogenic claudication: Secondary | ICD-10-CM

## 2013-08-18 DIAGNOSIS — Z96659 Presence of unspecified artificial knee joint: Secondary | ICD-10-CM | POA: Insufficient documentation

## 2013-08-18 DIAGNOSIS — M5137 Other intervertebral disc degeneration, lumbosacral region: Secondary | ICD-10-CM

## 2013-08-18 MED ORDER — DULOXETINE HCL 60 MG PO CPEP: 60.0000 mg | ORAL_CAPSULE | Freq: Every day | ORAL | Status: DC

## 2013-08-18 NOTE — Patient Instructions (Signed)
Follow up 09/15/13 with Dr Letta Pate at 10:45

## 2013-08-18 NOTE — Progress Notes (Signed)
Here for pill count and medication refills.  MSIR 15 mg # 50 Fill date  08/05/13   Today NV#   VSS   Recent "near fall"  On 08/13/13, but it was from standing to a sitting on a stool, and this resulted in increased pain to the point she went to see an orth MD at [facility] and physical therapy dept gave her a abd binder (lumbar) that she is wearing and complaining it does not fit right.  Since we do not carry these binders I cannot advise her as to the fit.   Her pill counts are appropriate. Refill given for her MS Contin.  Her fall risk is moderate and she walks with a cane.  I have talked with her about fall prevention and given her a fall prevention in the home handout.  She will return in a month for refill and to see Dr Letta Pate.

## 2013-08-19 ENCOUNTER — Encounter: Payer: Self-pay | Admitting: *Deleted

## 2013-08-27 ENCOUNTER — Telehealth: Payer: Self-pay

## 2013-08-27 ENCOUNTER — Encounter: Payer: Self-pay | Admitting: Physical Medicine & Rehabilitation

## 2013-08-27 NOTE — Telephone Encounter (Signed)
Patient is requesting a letter of necessity to send to her insurance company([employer]) so she can receive home health care services. Patient is not able to take care of herself.

## 2013-08-28 NOTE — Telephone Encounter (Signed)
FYI:  Spoke with daughter.  Printed letter.  Mailed to patient's home.

## 2013-09-03 ENCOUNTER — Telehealth: Payer: Self-pay | Admitting: Physical Medicine & Rehabilitation

## 2013-09-03 NOTE — Telephone Encounter (Signed)
Patient's daughter, Blondell Reveal, called back.  We sent letter stating that she needed PT, OT, and home health aide to the patient's home, because they said [facility] required it to get home health.  Now they say that it must state that she needs the home health aide to be in the home 8 hours per day, up to 35 hours per week.  Also, it must be sent directly to [employer].  She is asking for the letter to be rewritten, then we need to call [facility] at [phone] and ask them where to fax the letter.  It needs to include her [employer] member ID of [id number], because although her DOB is 10/25/1940, [facility] has it listed as [birth year].  Please call Langley Gauss when completed, or if there are any problems with doing this.

## 2013-09-08 ENCOUNTER — Telehealth: Payer: Self-pay

## 2013-09-08 NOTE — Telephone Encounter (Signed)
Patients daughter called again to have [place] contact [employer] to discuss patient getting home health care.  Patient needs someone 8 hours a day about 32 hours a week to help with light housekeeping, meal prep and therapy.  Spoke with [facility] and they say this is a covered service and nothing special needs to be done.  Home health just needs to provide services and they can send in the claim.  It should be paid.  So is it ok to order home health aide to help with the above and OT/PT.  Please advise.

## 2013-09-08 NOTE — Telephone Encounter (Signed)
Per Faythe Dingwall patients daughter called again regarding home health issues.  Langley Gauss told Faythe Dingwall she was not contacted.  Called [place] and clarified that we had spoken and things were being processed.

## 2013-09-09 NOTE — Telephone Encounter (Signed)
Patients daughter says patient is home bound.  Advised patient daughter that patient would need re-evaluated for home health care.  Also recommended they reach out to private care.  Patient has an appointment to discuss this.

## 2013-09-09 NOTE — Telephone Encounter (Signed)
I don't recall this pt being so disabled, I would need to re eval pt before recommended such extensive care

## 2013-09-15 ENCOUNTER — Ambulatory Visit: Payer: Self-pay | Admitting: Physical Medicine & Rehabilitation

## 2013-09-15 ENCOUNTER — Encounter: Payer: Worker's Compensation | Attending: Physical Medicine and Rehabilitation

## 2013-09-15 DIAGNOSIS — I10 Essential (primary) hypertension: Secondary | ICD-10-CM | POA: Insufficient documentation

## 2013-09-15 DIAGNOSIS — E119 Type 2 diabetes mellitus without complications: Secondary | ICD-10-CM | POA: Insufficient documentation

## 2013-09-15 DIAGNOSIS — M25569 Pain in unspecified knee: Secondary | ICD-10-CM | POA: Insufficient documentation

## 2013-09-15 DIAGNOSIS — M545 Low back pain, unspecified: Secondary | ICD-10-CM | POA: Insufficient documentation

## 2013-09-15 DIAGNOSIS — M5126 Other intervertebral disc displacement, lumbar region: Secondary | ICD-10-CM | POA: Insufficient documentation

## 2013-09-15 DIAGNOSIS — G8929 Other chronic pain: Secondary | ICD-10-CM | POA: Insufficient documentation

## 2013-09-16 NOTE — Telephone Encounter (Signed)
Pt never came in closing encounter

## 2013-09-18 ENCOUNTER — Ambulatory Visit (HOSPITAL_BASED_OUTPATIENT_CLINIC_OR_DEPARTMENT_OTHER): Payer: Worker's Compensation | Admitting: Physical Medicine & Rehabilitation

## 2013-09-18 ENCOUNTER — Encounter: Payer: Self-pay | Admitting: Physical Medicine & Rehabilitation

## 2013-09-18 VITALS — BP 143/76 | HR 79 | Resp 14 | Ht 65.0 in | Wt 226.0 lb

## 2013-09-18 DIAGNOSIS — E1149 Type 2 diabetes mellitus with other diabetic neurological complication: Secondary | ICD-10-CM

## 2013-09-18 DIAGNOSIS — M545 Low back pain, unspecified: Secondary | ICD-10-CM | POA: Diagnosis present

## 2013-09-18 DIAGNOSIS — G8929 Other chronic pain: Secondary | ICD-10-CM | POA: Diagnosis not present

## 2013-09-18 DIAGNOSIS — M5126 Other intervertebral disc displacement, lumbar region: Secondary | ICD-10-CM | POA: Diagnosis not present

## 2013-09-18 DIAGNOSIS — E1140 Type 2 diabetes mellitus with diabetic neuropathy, unspecified: Secondary | ICD-10-CM

## 2013-09-18 DIAGNOSIS — M25569 Pain in unspecified knee: Secondary | ICD-10-CM | POA: Diagnosis not present

## 2013-09-18 DIAGNOSIS — E1142 Type 2 diabetes mellitus with diabetic polyneuropathy: Secondary | ICD-10-CM

## 2013-09-18 DIAGNOSIS — E119 Type 2 diabetes mellitus without complications: Secondary | ICD-10-CM | POA: Diagnosis not present

## 2013-09-18 DIAGNOSIS — I10 Essential (primary) hypertension: Secondary | ICD-10-CM | POA: Diagnosis not present

## 2013-09-18 DIAGNOSIS — M48061 Spinal stenosis, lumbar region without neurogenic claudication: Secondary | ICD-10-CM

## 2013-09-18 MED ORDER — MORPHINE SULFATE 15 MG PO TABS: 15.0000 mg | ORAL_TABLET | Freq: Three times a day (TID) | ORAL | Status: DC

## 2013-09-18 NOTE — Patient Instructions (Addendum)
Continue morphine three times a day and Gabapentin 400 mg at night Stop Nortriptyline Stop Methocarbamol

## 2013-09-18 NOTE — Progress Notes (Signed)
Subjective:    Patient ID: Paige Arnold, female    DOB: 1940-08-28, 73 y.o.   MRN: [id number]  HPI Chief concern: Drowsiness and no energy, falling asleep during the day Secondary concerns left knee pain Daughter is with patient today. She has addressed concerns about daytime drowsiness and poor energy with primary care physician. PCP feels it may be medication related and patient and family would like to discuss the pain related medications with me. No recent falls. Has pain in the feet at night. Currently taking Neurontin 400 mg in the morning. Neurontin dose reduced by primary care physician Currently taking morphine sulfate 15 mg 3 times per day Currently taking nortriptyline 25 mg at night Currently taking methocarbamol 500 mg 3 times per day Pain Inventory Average Pain 8 Pain Right Now 8 My pain is sharp, burning, dull, stabbing, tingling and aching  In the last 24 hours, has pain interfered with the following? General activity 0 Relation with others 0 Enjoyment of life 0 What TIME of day is your pain at its worst? morning, day and evening Sleep (in general) Fair  Pain is worse with: walking, bending, standing and some activites Pain improves with: rest, heat/ice and medication Relief from Meds: 4  Mobility use a cane ability to climb steps?  no do you drive?  no needs help with transfers Do you have any goals in this area?  yes  Function disabled: date disabled 1999 retired I need assistance with the following:  meal prep, household duties and shopping  Neuro/Psych bladder control problems weakness trouble walking dizziness depression anxiety  Prior Studies x-rays  Physicians involved in your care Primary care . Orthopedist .   Family History  Problem Relation Age of Onset  . Kidney disease Mother   . Heart disease Father   . Diabetes Brother   . Alcohol abuse Brother   . Depression Brother   . Sarcoidosis Brother   . ALS Brother   . Heart  disease Brother    History   Social History  . Marital Status: Married    Spouse Name: N/A    Number of Children: N/A  . Years of Education: N/A   Social History Main Topics  . Smoking status: Never Smoker   . Smokeless tobacco: Never Used  . Alcohol Use: No  . Drug Use: No  . Sexual Activity: None   Other Topics Concern  . None   Social History Narrative  . None   Past Surgical History  Procedure Laterality Date  . Tubal ligation    . Temporomandibular joint surgery    . Partial knee arthroplasty    . Cholecystectomy     Past Medical History  Diagnosis Date  . Thyroid disorder   . Kidney disease   . Arthritis   . Hypertension   . Diabetes mellitus    BP 143/76  Pulse 79  Resp 14  Ht 5\' 5"  (1.651 m)  Wt 226 lb (102.513 kg)  BMI 37.61 kg/m2  SpO2 95%  Opioid Risk Score:   Fall Risk Score: High Fall Risk (>13 points) (patient educated handout given)   Review of Systems  Constitutional: Positive for diaphoresis and unexpected weight change.  Cardiovascular: Positive for leg swelling.  Gastrointestinal: Positive for constipation.  Genitourinary: Positive for difficulty urinating.  Musculoskeletal: Positive for back pain and gait problem.  Neurological: Positive for dizziness and weakness.  Psychiatric/Behavioral: Positive for dysphoric mood. The patient is nervous/anxious.   All other systems reviewed  and are negative.       Objective:   Physical Exam  Nursing note and vitals reviewed. HENT:  Head: Normocephalic and atraumatic.  Eyes: EOM are normal. Pupils are equal, round, and reactive to light.  Neck: Normal range of motion. Neck supple.  Musculoskeletal:  Negative straight leg raise Ambulates without evidence of toe drag or knee instability.  Neurological: She is alert. She has normal strength.  Psychiatric: Her affect is blunt. Her speech is delayed. She is slowed and withdrawn.   Obese female in no acute distress       Assessment &  Plan:  1. Chronic pain multifactorial, has lumbar spinal stenosis  as well as diabetic neuropathy. We discussed her pain medications and have made the following recommendations Discontinue nortriptyline Discontinue methocarbamol Continue morphineIR 15 mg 3 times per day Change gabapentin to 400 mg each bedtime  Also has had reduced function secondary to deconditioning as well as problems with diabetic neuropathy. She has poor balance. High fall risk. Referral to home health PT to help with balance and reconditioning Home health nursing to monitor medications as she seems to have difficulty keeping track and she is also undergoing multiple medication changes at the current time Also advised home health aide to help with complex ADLs  Return to clinic 4 weeks with M.D., if symptoms have not improved, would consider change from morphine to tramadol

## 2013-10-07 ENCOUNTER — Telehealth: Payer: Self-pay | Admitting: Physical Medicine & Rehabilitation

## 2013-10-07 NOTE — Telephone Encounter (Signed)
Received message per Bonnita Nasuti that daughter had called again saying that she didn't want to use [place] home health because they don't provide cooking/cleaning services.  I have left a message to let her know that [facility] is not accepting new patients and that home health agencies, such as [facility] and [place] typically do not provide these services.  Recommended that she look for a sitter/"angel hands" service provider, and that we would be happy to send the order to them, if they need one.

## 2013-10-13 ENCOUNTER — Telehealth: Payer: Self-pay

## 2013-10-13 NOTE — Telephone Encounter (Signed)
Paige Arnold PT @ [facility] is requesting a verbal order for PT. Is this okay?

## 2013-10-15 NOTE — Telephone Encounter (Signed)
Okay 

## 2013-10-15 NOTE — Telephone Encounter (Signed)
Contacted Rick @ [facility] to give him the verbal okay to begin PT with patient per Dr. Letta Pate.

## 2013-10-21 HISTORY — PX: INTRAOCULAR LENS INSERTION: SHX110

## 2013-10-23 ENCOUNTER — Encounter: Payer: Worker's Compensation | Attending: Physical Medicine & Rehabilitation

## 2013-10-23 ENCOUNTER — Ambulatory Visit (HOSPITAL_BASED_OUTPATIENT_CLINIC_OR_DEPARTMENT_OTHER): Payer: Worker's Compensation | Admitting: Physical Medicine & Rehabilitation

## 2013-10-23 ENCOUNTER — Encounter: Payer: Self-pay | Admitting: Physical Medicine & Rehabilitation

## 2013-10-23 VITALS — BP 154/71 | HR 75 | Resp 16 | Ht 65.0 in | Wt 217.0 lb

## 2013-10-23 DIAGNOSIS — M545 Low back pain, unspecified: Secondary | ICD-10-CM | POA: Diagnosis not present

## 2013-10-23 DIAGNOSIS — E1149 Type 2 diabetes mellitus with other diabetic neurological complication: Secondary | ICD-10-CM

## 2013-10-23 DIAGNOSIS — M48061 Spinal stenosis, lumbar region without neurogenic claudication: Secondary | ICD-10-CM | POA: Insufficient documentation

## 2013-10-23 DIAGNOSIS — E1140 Type 2 diabetes mellitus with diabetic neuropathy, unspecified: Secondary | ICD-10-CM

## 2013-10-23 DIAGNOSIS — G8929 Other chronic pain: Secondary | ICD-10-CM | POA: Diagnosis not present

## 2013-10-23 DIAGNOSIS — E1142 Type 2 diabetes mellitus with diabetic polyneuropathy: Secondary | ICD-10-CM | POA: Insufficient documentation

## 2013-10-23 DIAGNOSIS — I10 Essential (primary) hypertension: Secondary | ICD-10-CM | POA: Insufficient documentation

## 2013-10-23 MED ORDER — METAXALONE 800 MG PO TABS: 800.0000 mg | ORAL_TABLET | Freq: Three times a day (TID) | ORAL | Status: DC

## 2013-10-23 MED ORDER — MORPHINE SULFATE 15 MG PO TABS: 15.0000 mg | ORAL_TABLET | Freq: Three times a day (TID) | ORAL | Status: DC

## 2013-10-23 MED ORDER — GABAPENTIN 400 MG PO CAPS
400.0000 mg | ORAL_CAPSULE | Freq: Every day | ORAL | Status: DC
Start: 2013-10-23 — End: 2014-01-07

## 2013-10-23 NOTE — Patient Instructions (Signed)
New medicine is called Skelaxin is less sedating then methocarbamol but still can cause some sedation. Please take it it on an as-needed basis rather than 3 times a day scheduled  Continue on the same dose of morphine 15 mg 3 times a day Continue on the same dose of gabapentin 400 mg at night  Return to see me 1 month

## 2013-10-23 NOTE — Progress Notes (Signed)
Subjective:    Patient ID: Paige Arnold, female    DOB: [birth year], 73 y.o.   MRN: [id number]  HPI Patient states she is less sedated L. than prior to making medication adjustments. Primary care reduced her Neurontin just 2 at night We discontinued methocarbamol as well as nortriptyline  Patient complaining of increasing back pain as well as knee pain No increase in foot pain Pain Inventory Average Pain 8 Pain Right Now 8 My pain is sharp, burning, dull, stabbing, tingling and aching  In the last 24 hours, has pain interfered with the following? General activity 1 Relation with others 1 Enjoyment of life 1 What TIME of day is your pain at its worst? constant Sleep (in general) Poor  Pain is worse with: walking, bending, standing and some activites Pain improves with: rest, therapy/exercise and medication Relief from Meds: 3  Mobility use a cane ability to climb steps?  yes do you drive?  no needs help with transfers Do you have any goals in this area?  yes  Function retired I need assistance with the following:  meal prep, household duties and shopping  Neuro/Psych weakness confusion depression anxiety  Prior Studies Any changes since last visit?  no  Physicians involved in your care Any changes since last visit?  no   Family History  Problem Relation Age of Onset  . Kidney disease Mother   . Heart disease Father   . Diabetes Brother   . Alcohol abuse Brother   . Depression Brother   . Sarcoidosis Brother   . ALS Brother   . Heart disease Brother    History   Social History  . Marital Status: Married    Spouse Name: N/A    Number of Children: N/A  . Years of Education: N/A   Social History Main Topics  . Smoking status: Never Smoker   . Smokeless tobacco: Never Used  . Alcohol Use: No  . Drug Use: No  . Sexual Activity: None   Other Topics Concern  . None   Social History Narrative  . None   Past Surgical History  Procedure  Laterality Date  . Tubal ligation    . Temporomandibular joint surgery    . Partial knee arthroplasty    . Cholecystectomy    . Intraocular lens insertion Right 10-21-13   Past Medical History  Diagnosis Date  . Thyroid disorder   . Kidney disease   . Arthritis   . Hypertension   . Diabetes mellitus    BP 154/71  Pulse 75  Resp 16  Ht 5\' 5"  (1.651 m)  Wt 217 lb (98.431 kg)  BMI 36.11 kg/m2  SpO2 95%  Opioid Risk Score:   Fall Risk Score:     Review of Systems  Constitutional: Positive for chills, appetite change and unexpected weight change.  Gastrointestinal: Positive for nausea and abdominal pain.  Neurological: Positive for weakness.  Psychiatric/Behavioral: Positive for confusion, dysphoric mood and agitation.  All other systems reviewed and are negative.      Objective:   Physical Exam Lumbar spine reduced range of motion 75% flexion extension and lateral bending Left knee with tenderness over the anserine bursa Right knee without tenderness Lumbar spine tenderness bilateral L5-S1 paraspinal muscle area Negative straight leg raising Normal strength in both lower limbs No pain over the greater trochanter area      Assessment & Plan:  1. Spinal stenosis chronic low back pain radiating into the legs She had excessive  sedation on the previous medication regimen. Appears to be much brighter today however complaining of increased pain. We discussed that there must be a balancing active twin pain relief and adverse side effects We will start on when necessary Skelaxin. This would address the low back pain. Should be less sedating but still has potential. Skelaxin 800 mg 3 times a day when necessary  Continue morphine sulfate immediate release 15 mg 3 times a day Continue gabapentin 400 mg each bedtime  Return to clinic one month

## 2013-11-20 ENCOUNTER — Encounter: Payer: Self-pay | Admitting: Registered Nurse

## 2013-11-20 ENCOUNTER — Encounter: Payer: Worker's Compensation | Attending: Physical Medicine & Rehabilitation | Admitting: Registered Nurse

## 2013-11-20 VITALS — BP 144/76 | HR 68 | Resp 14 | Ht 65.0 in | Wt 218.0 lb

## 2013-11-20 DIAGNOSIS — E1140 Type 2 diabetes mellitus with diabetic neuropathy, unspecified: Secondary | ICD-10-CM

## 2013-11-20 DIAGNOSIS — E1149 Type 2 diabetes mellitus with other diabetic neurological complication: Secondary | ICD-10-CM | POA: Insufficient documentation

## 2013-11-20 DIAGNOSIS — Z79899 Other long term (current) drug therapy: Secondary | ICD-10-CM

## 2013-11-20 DIAGNOSIS — M545 Low back pain, unspecified: Secondary | ICD-10-CM | POA: Insufficient documentation

## 2013-11-20 DIAGNOSIS — E1142 Type 2 diabetes mellitus with diabetic polyneuropathy: Secondary | ICD-10-CM

## 2013-11-20 DIAGNOSIS — M25562 Pain in left knee: Secondary | ICD-10-CM

## 2013-11-20 DIAGNOSIS — M48061 Spinal stenosis, lumbar region without neurogenic claudication: Secondary | ICD-10-CM | POA: Insufficient documentation

## 2013-11-20 DIAGNOSIS — Z5181 Encounter for therapeutic drug level monitoring: Secondary | ICD-10-CM

## 2013-11-20 DIAGNOSIS — I10 Essential (primary) hypertension: Secondary | ICD-10-CM | POA: Insufficient documentation

## 2013-11-20 DIAGNOSIS — M25569 Pain in unspecified knee: Secondary | ICD-10-CM

## 2013-11-20 DIAGNOSIS — G8929 Other chronic pain: Secondary | ICD-10-CM | POA: Insufficient documentation

## 2013-11-20 MED ORDER — MORPHINE SULFATE 15 MG PO TABS: 15.0000 mg | ORAL_TABLET | Freq: Three times a day (TID) | ORAL | Status: DC

## 2013-11-20 NOTE — Patient Instructions (Signed)
Hip Exercises RANGE OF MOTION (ROM) AND STRETCHING EXERCISES  These exercises may help you when beginning to rehabilitate your injury. Doing them too aggressively can worsen your condition. Complete them slowly and gently. Your symptoms may resolve with or without further involvement from your physician, physical therapist or athletic trainer. While completing these exercises, remember:   Restoring tissue flexibility helps normal motion to return to the joints. This allows healthier, less painful movement and activity.  An effective stretch should be held for at least 30 seconds.  A stretch should never be painful. You should only feel a gentle lengthening or release in the stretched tissue. If these stretches worsen your symptoms even when done gently, consult your physician, physical therapist or athletic trainer. STRETCH Hamstrings, Supine   Lie on your back. Loop a belt or towel over the ball of your right / left foot.  Straighten your right / left knee and slowly pull on the belt to raise your leg. Do not allow the right / left knee to bend. Keep your opposite leg flat on the floor.  Raise the leg until you feel a gentle stretch behind your right / left knee or thigh. Hold this position for __________ seconds. Repeat __________ times. Complete this stretch __________ times per day.  STRETCH - Hip Rotators   Lie on your back on a firm surface. Grasp your right / left knee with your right / left hand and your ankle with your opposite hand.  Keeping your hips and shoulders firmly planted, gently pull your right / left knee and rotate your lower leg toward your opposite shoulder until you feel a stretch in your buttocks.  Hold this stretch for __________ seconds. Repeat this stretch __________ times. Complete this stretch __________ times per day. STRETCH - Hamstrings/Adductors, V-Sit   Sit on the floor with your legs extended in a large "V," keeping your knees straight.  With your head  and chest upright, bend at your waist reaching for your right foot to stretch your left adductors.  You should feel a stretch in your left inner thigh. Hold for __________ seconds.  Return to the upright position to relax your leg muscles.  Continuing to keep your chest upright, bend straight forward at your waist to stretch your hamstrings.  You should feel a stretch behind both of your thighs and/or knees. Hold for __________ seconds.  Return to the upright position to relax your leg muscles.  Repeat steps 2 through 4 for opposite leg. Repeat __________ times. Complete this exercise __________ times per day.  STRETCHING - Hip Flexors, Lunge  Half kneel with your right / left knee on the floor and your opposite knee bent and directly over your ankle.  Keep good posture with your head over your shoulders. Tighten your buttocks to point your tailbone downward; this will prevent your back from arching too much.  You should feel a gentle stretch in the front of your thigh and/or hip. If you do not feel any resistance, slightly slide your opposite foot forward and then slowly lunge forward so your knee once again lines up over your ankle. Be sure your tailbone remains pointed downward.  Hold this stretch for __________ seconds. Repeat __________ times. Complete this stretch __________ times per day. STRENGTHENING EXERCISES These exercises may help you when beginning to rehabilitate your injury. They may resolve your symptoms with or without further involvement from your physician, physical therapist or athletic trainer. While completing these exercises, remember:   Muscles can  gain both the endurance and the strength needed for everyday activities through controlled exercises.  Complete these exercises as instructed by your physician, physical therapist or athletic trainer. Progress the resistance and repetitions only as guided.  You may experience muscle soreness or fatigue, but the pain  or discomfort you are trying to eliminate should never worsen during these exercises. If this pain does worsen, stop and make certain you are following the directions exactly. If the pain is still present after adjustments, discontinue the exercise until you can discuss the trouble with your clinician. STRENGTH - Hip Extensors, Bridge   Lie on your back on a firm surface. Bend your knees and place your feet flat on the floor.  Tighten your buttocks muscles and lift your bottom off the floor until your trunk is level with your thighs. You should feel the muscles in your buttocks and back of your thighs working. If you do not feel these muscles, slide your feet 1-2 inches further away from your buttocks.  Hold this position for __________ seconds.  Slowly lower your hips to the starting position and allow your buttock muscles relax completely before beginning the next repetition.  If this exercise is too easy, you may cross your arms over your chest. Repeat __________ times. Complete this exercise __________ times per day.  STRENGTH - Hip Abductors, Straight Leg Raises  Be aware of your form throughout the entire exercise so that you exercise the correct muscles. Sloppy form means that you are not strengthening the correct muscles.  Lie on your side so that your head, shoulders, knee and hip line up. You may bend your lower knee to help maintain your balance. Your right / left leg should be on top.  Roll your hips slightly forward, so that your hips are stacked directly over each other and your right / left knee is facing forward.  Lift your top leg up 4-6 inches, leading with your heel. Be sure that your foot does not drift forward or that your knee does not roll toward the ceiling.  Hold this position for __________ seconds. You should feel the muscles in your outer hip lifting (you may not notice this until your leg begins to tire).  Slowly lower your leg to the starting position. Allow the  muscles to fully relax before beginning the next repetition. Repeat __________ times. Complete this exercise __________ times per day.  STRENGTH - Hip Adductors, Straight Leg Raises   Lie on your side so that your head, shoulders, knee and hip line up. You may place your upper foot in front to help maintain your balance. Your right / left leg should be on the bottom.  Roll your hips slightly forward, so that your hips are stacked directly over each other and your right / left knee is facing forward.  Tense the muscles in your inner thigh and lift your bottom leg 4-6 inches. Hold this position for __________ seconds.  Slowly lower your leg to the starting position. Allow the muscles to fully relax before beginning the next repetition. Repeat __________ times. Complete this exercise __________ times per day.  STRENGTH - Quadriceps, Straight Leg Raises  Quality counts! Watch for signs that the quadriceps muscle is working to insure you are strengthening the correct muscles and not "cheating" by substituting with healthier muscles.  Lay on your back with your right / left leg extended and your opposite knee bent.  Tense the muscles in the front of your right / left thigh.  You should see either your knee cap slide up or increased dimpling just above the knee. Your thigh may even quiver.  Tighten these muscles even more and raise your leg 4 to 6 inches off the floor. Hold for right / left seconds.  Keeping these muscles tense, lower your leg.  Relax the muscles slowly and completely in between each repetition. Repeat __________ times. Complete this exercise __________ times per day.  STRENGTH - Hip Abductors, Standing  Tie one end of a rubber exercise band/tubing to a secure surface (table, pole) and tie a loop at the other end.  Place the loop around your right / left ankle. Keeping your ankle with the band directly opposite of the secured end, step away until there is tension in the  tube/band.  Hold onto a chair as needed for balance.  Keeping your back upright, your shoulders over your hips, and your toes pointing forward, lift your right / left leg out to your side. Be sure to lift your leg with your hip muscles. Do not "throw" your leg or tip your body to lift your leg.  Slowly and with control, return to the starting position. Repeat exercise __________ times. Complete this exercise __________ times per day.  STRENGTH  Quadriceps, Squats  Stand in a door frame so that your feet and knees are in line with the frame.  Use your hands for balance, not support, on the frame.  Slowly lower your weight, bending at the hips and knees. Keep your lower legs upright so that they are parallel with the door frame. Squat only within the range that does not increase your knee pain. Never let your hips drop below your knees.  Slowly return upright, pushing with your legs, not pulling with your hands. Document Released: 07/20/2005 Document Revised: 09/24/2011 Document Reviewed: 10/14/2008 [facility] Patient Information 2014 [place], [place]. Back Exercises Back exercises help treat and prevent back injuries. The goal is to increase your strength in your belly (abdominal) and back muscles. These exercises can also help with flexibility. Start these exercises when told by your doctor. HOME CARE Back exercises include: Pelvic Tilt.  Lie on your back with your knees bent. Tilt your pelvis until the lower part of your back is against the floor. Hold this position 5 to 10 sec. Repeat this exercise 5 to 10 times. Knee to Chest.  Pull 1 knee up against your chest and hold for 20 to 30 seconds. Repeat this with the other knee. This may be done with the other leg straight or bent, whichever feels better. Then, pull both knees up against your chest. Sit-Ups or Curl-Ups.  Bend your knees 90 degrees. Start with tilting your pelvis, and do a partial, slow sit-up. Only lift your upper half 30  to 45 degrees off the floor. Take at least 2 to 3 seonds for each sit-up. Do not do sit-ups with your knees out straight. If partial sit-ups are difficult, simply do the above but with only tightening your belly (abdominal) muscles and holding it as told. Hip-Lift.  Lie on your back with your knees flexed 90 degrees. Push down with your feet and shoulders as you raise your hips 2 inches off the floor. Hold for 10 seconds, repeat 5 to 10 times. Back Arches.  Lie on your stomach. Prop yourself up on bent elbows. Slowly press on your hands, causing an arch in your low back. Repeat 3 to 5 times. Shoulder-Lifts.  Lie face down with arms beside your body.  Keep hips and belly pressed to floor as you slowly lift your head and shoulders off the floor. Do not overdo your exercises. Be careful in the beginning. Exercises may cause you some mild back discomfort. If the pain lasts for more than 15 minutes, stop the exercises until you see your doctor. Improvement with exercise for back problems is slow.  Document Released: 08/04/2010 Document Revised: 09/24/2011 Document Reviewed: 05/03/2011 [facility] Patient Information 2014 [place], [place]. Knee Exercises EXERCISES RANGE OF MOTION(ROM) AND STRETCHING EXERCISES These exercises may help you when beginning to rehabilitate your injury. Your symptoms may resolve with or without further involvement from your physician, physical therapist or athletic trainer. While completing these exercises, remember:   Restoring tissue flexibility helps normal motion to return to the joints. This allows healthier, less painful movement and activity.  An effective stretch should be held for at least 30 seconds.  A stretch should never be painful. You should only feel a gentle lengthening or release in the stretched tissue. STRETCH - Knee Extension, Prone  Lie on your stomach on a firm surface, such as a bed or countertop. Place your right / left knee and leg just beyond the  edge of the surface. You may wish to place a towel under the far end of your right / left thigh for comfort.  Relax your leg muscles and allow gravity to straighten your knee. Your clinician may advise you to add an ankle weight if more resistance is helpful for you.  You should feel a stretch in the back of your right / left knee. Hold this position for __________ seconds. Repeat __________ times. Complete this stretch __________ times per day. * Your physician, physical therapist or athletic trainer may ask you to add ankle weight to enhance your stretch.  RANGE OF MOTION - Knee Flexion, Active  Lie on your back with both knees straight. (If this causes back discomfort, bend your opposite knee, placing your foot flat on the floor.)  Slowly slide your heel back toward your buttocks until you feel a gentle stretch in the front of your knee or thigh.  Hold for __________ seconds. Slowly slide your heel back to the starting position. Repeat __________ times. Complete this exercise __________ times per day.  STRETCH - Quadriceps, Prone   Lie on your stomach on a firm surface, such as a bed or padded floor.  Bend your right / left knee and grasp your ankle. If you are unable to reach, your ankle or pant leg, use a belt around your foot to lengthen your reach.  Gently pull your heel toward your buttocks. Your knee should not slide out to the side. You should feel a stretch in the front of your thigh and/or knee.  Hold this position for __________ seconds. Repeat __________ times. Complete this stretch __________ times per day.  STRETCH  Hamstrings, Supine   Lie on your back. Loop a belt or towel over the ball of your right / left foot.  Straighten your right / left knee and slowly pull on the belt to raise your leg. Do not allow the right / left knee to bend. Keep your opposite leg flat on the floor.  Raise the leg until you feel a gentle stretch behind your right / left knee or thigh. Hold  this position for __________ seconds. Repeat __________ times. Complete this stretch __________ times per day.  STRENGTHENING EXERCISES These exercises may help you when beginning to rehabilitate your injury. They may resolve your  symptoms with or without further involvement from your physician, physical therapist or athletic trainer. While completing these exercises, remember:   Muscles can gain both the endurance and the strength needed for everyday activities through controlled exercises.  Complete these exercises as instructed by your physician, physical therapist or athletic trainer. Progress the resistance and repetitions only as guided.  You may experience muscle soreness or fatigue, but the pain or discomfort you are trying to eliminate should never worsen during these exercises. If this pain does worsen, stop and make certain you are following the directions exactly. If the pain is still present after adjustments, discontinue the exercise until you can discuss the trouble with your clinician. STRENGTH - Quadriceps, Isometrics  Lie on your back with your right / left leg extended and your opposite knee bent.  Gradually tense the muscles in the front of your right / left thigh. You should see either your knee cap slide up toward your hip or increased dimpling just above the knee. This motion will push the back of the knee down toward the floor/mat/bed on which you are lying.  Hold the muscle as tight as you can without increasing your pain for __________ seconds.  Relax the muscles slowly and completely in between each repetition. Repeat __________ times. Complete this exercise __________ times per day.  STRENGTH - Quadriceps, Short Arcs   Lie on your back. Place a __________ inch towel roll under your knee so that the knee slightly bends.  Raise only your lower leg by tightening the muscles in the front of your thigh. Do not allow your thigh to rise.  Hold this position for  __________ seconds. Repeat __________ times. Complete this exercise __________ times per day.  OPTIONAL ANKLE WEIGHTS: Begin with ____________________, but DO NOT exceed ____________________. Increase in 1 pound/0.5 kilogram increments.  STRENGTH - Quadriceps, Straight Leg Raises  Quality counts! Watch for signs that the quadriceps muscle is working to insure you are strengthening the correct muscles and not "cheating" by substituting with healthier muscles.  Lay on your back with your right / left leg extended and your opposite knee bent.  Tense the muscles in the front of your right / left thigh. You should see either your knee cap slide up or increased dimpling just above the knee. Your thigh may even quiver.  Tighten these muscles even more and raise your leg 4 to 6 inches off the floor. Hold for __________ seconds.  Keeping these muscles tense, lower your leg.  Relax the muscles slowly and completely in between each repetition. Repeat __________ times. Complete this exercise __________ times per day.  STRENGTH - Hamstring, Curls  Lay on your stomach with your legs extended. (If you lay on a bed, your feet may hang over the edge.)  Tighten the muscles in the back of your thigh to bend your right / left knee up to 90 degrees. Keep your hips flat on the bed/floor.  Hold this position for __________ seconds.  Slowly lower your leg back to the starting position. Repeat __________ times. Complete this exercise __________ times per day.  OPTIONAL ANKLE WEIGHTS: Begin with ____________________, but DO NOT exceed ____________________. Increase in 1 pound/0.5 kilogram increments.  STRENGTH  Quadriceps, Squats  Stand in a door frame so that your feet and knees are in line with the frame.  Use your hands for balance, not support, on the frame.  Slowly lower your weight, bending at the hips and knees. Keep your lower legs upright so  that they are parallel with the door frame. Squat only  within the range that does not increase your knee pain. Never let your hips drop below your knees.  Slowly return upright, pushing with your legs, not pulling with your hands. Repeat __________ times. Complete this exercise __________ times per day.  STRENGTH - Quadriceps, Wall Slides  Follow guidelines for form closely. Increased knee pain often results from poorly placed feet or knees.  Lean against a smooth wall or door and walk your feet out 18-24 inches. Place your feet hip-width apart.  Slowly slide down the wall or door until your knees bend __________ degrees.* Keep your knees over your heels, not your toes, and in line with your hips, not falling to either side.  Hold for __________ seconds. Stand up to rest for __________ seconds in between each repetition. Repeat __________ times. Complete this exercise __________ times per day. * Your physician, physical therapist or athletic trainer will alter this angle based on your symptoms and progress. Document Released: 05/16/2005 Document Revised: 09/24/2011 Document Reviewed: 10/14/2008 [facility] Patient Information 2014 [place], [place].

## 2013-11-20 NOTE — Progress Notes (Signed)
Subjective:    Patient ID: Paige Arnold, female    DOB: 1941/01/02, 73 y.o.   MRN: [id number]  HPI: Paige Arnold is a 73 year old female who returns for follow up for chronic pain and medication refill. She says her pain is located in her left knee, back pain and bilateral hip pain. She rates her pain 4. Her current exercise regime is stretching exercises and walking.   Pain Inventory Average Pain 4 Pain Right Now 4 My pain is sharp, dull, stabbing, tingling and aching  In the last 24 hours, has pain interfered with the following? General activity 2 Relation with others 0 Enjoyment of life 0 What TIME of day is your pain at its worst? morning and evening Sleep (in general) Fair  Pain is worse with: walking, bending, sitting, standing and some activites Pain improves with: rest, heat/ice and medication Relief from Meds: 2  Mobility use a cane ability to climb steps?  yes do you drive?  yes Do you have any goals in this area?  no  Function not employed: date last employed 1999 disabled: date disabled 2000 retired I need assistance with the following:  meal prep, household duties and shopping Do you have any goals in this area?  no  Neuro/Psych tingling dizziness depression anxiety  Prior Studies Any changes since last visit?  no  Physicians involved in your care Dr Lin Landsman   Family History  Problem Relation Age of Onset  . Kidney disease Mother   . Heart disease Father   . Diabetes Brother   . Alcohol abuse Brother   . Depression Brother   . Sarcoidosis Brother   . ALS Brother   . Heart disease Brother    History   Social History  . Marital Status: Married    Spouse Name: N/A    Number of Children: N/A  . Years of Education: N/A   Social History Main Topics  . Smoking status: Never Smoker   . Smokeless tobacco: Never Used  . Alcohol Use: No  . Drug Use: No  . Sexual Activity: None   Other Topics Concern  . None   Social History Narrative    . None   Past Surgical History  Procedure Laterality Date  . Tubal ligation    . Temporomandibular joint surgery    . Partial knee arthroplasty    . Cholecystectomy    . Intraocular lens insertion Right 10-21-13   Past Medical History  Diagnosis Date  . Thyroid disorder   . Kidney disease   . Arthritis   . Hypertension   . Diabetes mellitus    BP 144/76  Pulse 68  Resp 14  Ht 5\' 5"  (1.651 m)  Wt 218 lb (98.884 kg)  BMI 36.28 kg/m2  SpO2 96%  Opioid Risk Score:   Fall Risk Score: High Fall Risk (>13 points) (patient educated handout declined)   Review of Systems  Constitutional: Positive for unexpected weight change.  Cardiovascular: Positive for leg swelling.  Gastrointestinal: Positive for abdominal pain.  Neurological: Positive for dizziness.       Tingling  Psychiatric/Behavioral: Positive for dysphoric mood. The patient is nervous/anxious.   All other systems reviewed and are negative.      Objective:   Physical Exam  Nursing note and vitals reviewed. Constitutional: She is oriented to person, place, and time. She appears well-developed and well-nourished.  HENT:  Head: Normocephalic and atraumatic.  Neck: Normal range of motion. Neck supple.  Cardiovascular: Normal rate, regular rhythm and normal heart sounds.   Pulmonary/Chest: Effort normal and breath sounds normal.  Musculoskeletal:  Normal Muscle Bulk: Muscle Testing reveals: Upper and Lower Extremities: Full Range of Motion and Muscle Strength 5/5. Left Patella: Tender to Touch Walks with cane Step gait Noted on the right, painful left Patella/ Antalgic Gait Arises from chair with ease  Neurological: She is alert and oriented to person, place, and time.  Skin: Skin is warm and dry.  Psychiatric: She has a normal mood and affect.          Assessment & Plan:  1. Spinal stenosis chronic low back pain radiating into the legs: Refilled: MSIR 15 mg take one tablet by mouth three times daily.  Continue Exercise Regime. Handouts Printed. 2. Diabetic Neuropathy: Continue Gabapentin. 3. Muscle Spasms: Continue Skelaxin. 4. Left Knee Pain: Continue Current Medication and exercise Regimen. 5. Bilateral Greater Trochanteric Bursitis: Continue Heat Therapy and exercise regimen.   20 minutes of face to face patient care time was spent during this visit. All questions were encouraged and answered.  F/U in 1 month

## 2013-12-21 ENCOUNTER — Encounter: Payer: Worker's Compensation | Attending: Physical Medicine & Rehabilitation | Admitting: Registered Nurse

## 2013-12-21 ENCOUNTER — Encounter: Payer: Self-pay | Admitting: Registered Nurse

## 2013-12-21 VITALS — BP 181/79 | HR 59 | Resp 14 | Wt 218.0 lb

## 2013-12-21 DIAGNOSIS — M545 Low back pain, unspecified: Secondary | ICD-10-CM | POA: Diagnosis present

## 2013-12-21 DIAGNOSIS — M48061 Spinal stenosis, lumbar region without neurogenic claudication: Secondary | ICD-10-CM | POA: Diagnosis present

## 2013-12-21 DIAGNOSIS — E1140 Type 2 diabetes mellitus with diabetic neuropathy, unspecified: Secondary | ICD-10-CM

## 2013-12-21 DIAGNOSIS — E1142 Type 2 diabetes mellitus with diabetic polyneuropathy: Secondary | ICD-10-CM

## 2013-12-21 DIAGNOSIS — G8929 Other chronic pain: Secondary | ICD-10-CM | POA: Diagnosis present

## 2013-12-21 DIAGNOSIS — Z79899 Other long term (current) drug therapy: Secondary | ICD-10-CM

## 2013-12-21 DIAGNOSIS — Z5181 Encounter for therapeutic drug level monitoring: Secondary | ICD-10-CM

## 2013-12-21 DIAGNOSIS — M25562 Pain in left knee: Secondary | ICD-10-CM

## 2013-12-21 DIAGNOSIS — M25569 Pain in unspecified knee: Secondary | ICD-10-CM

## 2013-12-21 DIAGNOSIS — E1149 Type 2 diabetes mellitus with other diabetic neurological complication: Secondary | ICD-10-CM

## 2013-12-21 MED ORDER — MORPHINE SULFATE 15 MG PO TABS: 15.0000 mg | ORAL_TABLET | Freq: Three times a day (TID) | ORAL | Status: DC

## 2013-12-21 MED ORDER — METHYLPREDNISOLONE 4 MG PO KIT: PACK | ORAL | Status: DC

## 2013-12-21 NOTE — Progress Notes (Signed)
Subjective:    Patient ID: Paige Arnold, female    DOB: 1941-02-21, 73 y.o.   MRN: [id number]  HPI: [name] is a 73 year old female who returns for follow up for chronic pain and medication refill. She says her pain is located in her right shoulder, lower back and left knee. She rates her pain 5. She uses heat and ice therapy along with her medication regime for relief of pain. Her current exercise regime is using her stationary glider daily she usually can do 20 minutes a day. She has noticed increase intensity of pain over the last few months, she has good days and bad ones. Today she says is a fair day. She is requesting a referral to a neurosurgeon, we will place order today.  She was hypertensive today blood pressure rechecked 197/92, she says she took her antihypertensives medications this morning. She  says she is having pain, she has been instructed to keep a blood presurre log daily . She has an appointment with her PMD this week.   She had catract surgery three weeks ago her shoulder has been hurting since then. She belives it's from the mattress. She is currently performing exercises for relief.  Pain Inventory Average Pain 7 Pain Right Now 5 My pain is sharp, burning, dull, stabbing, tingling and aching  In the last 24 hours, has pain interfered with the following? General activity 1 Relation with others 0 Enjoyment of life 3 What TIME of day is your pain at its worst? all Sleep (in general) Poor  Pain is worse with: walking, bending, sitting, standing and some activites Pain improves with: rest and heat/ice Relief from Meds: 2  Mobility use a cane ability to climb steps?  yes do you drive?  no  Function disabled: date disabled 2000 I need assistance with the following:  meal prep, household duties and shopping  Neuro/Psych weakness dizziness depression anxiety  Prior Studies Any changes since last visit?  no  Physicians involved in your care Any  changes since last visit?  no   Family History  Problem Relation Age of Onset  . Kidney disease Mother   . Heart disease Father   . Diabetes Brother   . Alcohol abuse Brother   . Depression Brother   . Sarcoidosis Brother   . ALS Brother   . Heart disease Brother    History   Social History  . Marital Status: Married    Spouse Name: N/A    Number of Children: N/A  . Years of Education: N/A   Social History Main Topics  . Smoking status: Never Smoker   . Smokeless tobacco: Never Used  . Alcohol Use: No  . Drug Use: No  . Sexual Activity: None   Other Topics Concern  . None   Social History Narrative  . None   Past Surgical History  Procedure Laterality Date  . Tubal ligation    . Temporomandibular joint surgery    . Partial knee arthroplasty    . Cholecystectomy    . Intraocular lens insertion Right 10-21-13   Past Medical History  Diagnosis Date  . Thyroid disorder   . Kidney disease   . Arthritis   . Hypertension   . Diabetes mellitus    BP 181/79  Pulse 59  Resp 14  Wt 218 lb (98.884 kg)  SpO2 95%  Opioid Risk Score:   Fall Risk Score: High Fall Risk (>13 points) (educated and handout given  for fall prevention in the home given previously)  Review of Systems  Constitutional: Positive for unexpected weight change.  Gastrointestinal: Positive for abdominal pain and constipation.  Genitourinary: Positive for dysuria and difficulty urinating.  Neurological: Positive for dizziness and weakness.  Psychiatric/Behavioral: Positive for dysphoric mood. The patient is nervous/anxious.   All other systems reviewed and are negative.      Objective:   Physical Exam  Nursing note and vitals reviewed. Constitutional: She is oriented to person, place, and time. She appears well-developed and well-nourished.  HENT:  Head: Normocephalic and atraumatic.  Neck: Normal range of motion. Neck supple.  Cervical Paraspinal Tenderness: C-3- C-5  Cardiovascular:  Normal rate and regular rhythm.   Pulmonary/Chest: Effort normal and breath sounds normal.  Musculoskeletal:  Normal Muscle Bulk and Muscle testing Reveals: Upper Extremities: Full ROM and Muscle Strength 5/5 Lumbar Paraspinal Tenderness Noted: L-4- L-5 Bilateral Greater Trochanteric Tenderness Noted  Lower Extremities: Right Leg Full ROM and Muscle Strength 5/5 Left Leg Flexion produces Pain into patella and tender to palpation to medial and lateral joint line. Arises from Chair with ease. Narrow Based Gait     Neurological: She is alert and oriented to person, place, and time.  Skin: Skin is warm and dry.  Psychiatric: She has a normal mood and affect.          Assessment & Plan:  1. Spinal stenosis chronic low back pain radiating into the legs: Refilled: MSIR 15 mg take one tablet by mouth three times daily #90 . Referral has been ordered with Dr. Vertell Limber. Continue Exercise Regime. 2. Diabetic Neuropathy: Continue Gabapentin.  3. Muscle Spasms: Continue Skelaxin.  4. Left Knee Pain: Continue Current Medication and exercise Regimen.  5. Bilateral Greater Trochanteric Bursitis:  RX: Medrol dose pak.  Continue exercise and heat therapy.Next appointment with Dr. Letta Pate  For steroid injection. Continue Heat Therapy and exercise regimen.   20 minutes of face to face patient care time was spent during this visit. All questions were encouraged and answered.  F/U in 1 month

## 2014-01-07 ENCOUNTER — Other Ambulatory Visit: Payer: Self-pay | Admitting: Physical Medicine & Rehabilitation

## 2014-01-25 ENCOUNTER — Other Ambulatory Visit: Payer: Self-pay | Admitting: Physical Medicine & Rehabilitation

## 2014-02-05 ENCOUNTER — Encounter: Payer: Worker's Compensation | Attending: Registered Nurse | Admitting: Registered Nurse

## 2014-02-05 ENCOUNTER — Encounter: Payer: Self-pay | Admitting: Registered Nurse

## 2014-02-05 VITALS — BP 172/83 | HR 69 | Resp 14 | Wt 216.0 lb

## 2014-02-05 DIAGNOSIS — G8929 Other chronic pain: Secondary | ICD-10-CM | POA: Insufficient documentation

## 2014-02-05 DIAGNOSIS — Z79899 Other long term (current) drug therapy: Secondary | ICD-10-CM | POA: Insufficient documentation

## 2014-02-05 DIAGNOSIS — M48061 Spinal stenosis, lumbar region without neurogenic claudication: Secondary | ICD-10-CM

## 2014-02-05 DIAGNOSIS — E1142 Type 2 diabetes mellitus with diabetic polyneuropathy: Secondary | ICD-10-CM

## 2014-02-05 DIAGNOSIS — E1149 Type 2 diabetes mellitus with other diabetic neurological complication: Secondary | ICD-10-CM

## 2014-02-05 DIAGNOSIS — Z5181 Encounter for therapeutic drug level monitoring: Secondary | ICD-10-CM | POA: Insufficient documentation

## 2014-02-05 DIAGNOSIS — E1140 Type 2 diabetes mellitus with diabetic neuropathy, unspecified: Secondary | ICD-10-CM

## 2014-02-05 DIAGNOSIS — M545 Low back pain, unspecified: Secondary | ICD-10-CM | POA: Diagnosis present

## 2014-02-05 DIAGNOSIS — M25562 Pain in left knee: Secondary | ICD-10-CM

## 2014-02-05 DIAGNOSIS — M25569 Pain in unspecified knee: Secondary | ICD-10-CM

## 2014-02-05 MED ORDER — MORPHINE SULFATE 15 MG PO TABS: 15.0000 mg | ORAL_TABLET | Freq: Three times a day (TID) | ORAL | Status: DC

## 2014-02-05 NOTE — Progress Notes (Signed)
Subjective:    Patient ID: Paige Arnold, female    DOB: Sep 08, 1940, 73 y.o.   MRN: [id number]  HPI: [name] is a 73 year old female who returns for follow up for chronic pain and medication refill. She says her pain is located in her right shoulder, lower back and left knee. She rates her pain 6. Her current exercise regime is using her stationary glider daily she usually can do 15 minutes a day and she is walking. She has noticed she has been having spasms in the palms of her hands. She is unable to differentiate if it's spasms or tremors. None has been noticed during her assessment. Her daughter in the room and all questions answered.  Pain Inventory Average Pain 5 Pain Right Now 6 My pain is sharp, burning, dull, stabbing, tingling and aching  In the last 24 hours, has pain interfered with the following? General activity 2 Relation with others 1 Enjoyment of life 0 What TIME of day is your pain at its worst? all Sleep (in general) Poor  Pain is worse with: walking, bending, sitting, inactivity, standing and some activites Pain improves with: rest, heat/ice and medication Relief from Meds: no answer  Mobility use a cane ability to climb steps?  yes do you drive?  no  Function disabled: date disabled 1999 I need assistance with the following:  meal prep, household duties and shopping  Neuro/Psych weakness tremor trouble walking confusion depression  Prior Studies Any changes since last visit?  no  Physicians involved in your care Any changes since last visit?  no   Family History  Problem Relation Age of Onset  . Kidney disease Mother   . Heart disease Father   . Diabetes Brother   . Alcohol abuse Brother   . Depression Brother   . Sarcoidosis Brother   . ALS Brother   . Heart disease Brother    History   Social History  . Marital Status: Married    Spouse Name: N/A    Number of Children: N/A  . Years of Education: N/A   Social History Main  Topics  . Smoking status: Never Smoker   . Smokeless tobacco: Never Used  . Alcohol Use: No  . Drug Use: No  . Sexual Activity: None   Other Topics Concern  . None   Social History Narrative  . None   Past Surgical History  Procedure Laterality Date  . Tubal ligation    . Temporomandibular joint surgery    . Partial knee arthroplasty    . Cholecystectomy    . Intraocular lens insertion Right 10-21-13   Past Medical History  Diagnosis Date  . Thyroid disorder   . Kidney disease   . Arthritis   . Hypertension   . Diabetes mellitus    BP 172/83  Pulse 69  Resp 14  Wt 216 lb (97.977 kg)  SpO2 94%  Opioid Risk Score:   Fall Risk Score: High Fall Risk (>13 points) (previously educated and given handout for fall prevention)  Review of Systems  Constitutional: Positive for chills and unexpected weight change.  Respiratory: Positive for apnea.   Cardiovascular: Positive for leg swelling.  Gastrointestinal: Positive for constipation.  Endocrine:       High blood sugar  Genitourinary: Positive for dysuria and difficulty urinating.  Musculoskeletal: Positive for back pain and gait problem.       Left knee pain  Neurological: Positive for dizziness.  Psychiatric/Behavioral: Positive for  confusion and dysphoric mood.  All other systems reviewed and are negative.      Objective:   Physical Exam  Nursing note and vitals reviewed. Constitutional: She is oriented to person, place, and time. She appears well-developed and well-nourished.  HENT:  Head: Normocephalic and atraumatic.  Neck: Normal range of motion. Neck supple.  Cardiovascular: Normal rate, regular rhythm and normal heart sounds.   Pulmonary/Chest: Effort normal and breath sounds normal.  Musculoskeletal:  Normal Muscle Bulk and Muscle Testing Reveals: Upper Extremities: Full ROM and Muscle Strength 5/5 Thoracic and Lumbar Hypersensitivity Lower Extremities: Full ROM and Muscle Strength 5/5 Left Leg Flexion  Produces Pain into Patella Trace Edema to Lower Extremities Arises from chair with ease Narrow Based gait Uses Straight Cane for Support  Neurological: She is alert and oriented to person, place, and time.  Skin: Skin is warm and dry.  Psychiatric: She has a normal mood and affect.          Assessment & Plan:  1. Spinal stenosis chronic low back pain radiating into the legs: Refilled: MSIR 15 mg take one tablet by mouth three times daily #90 . Awaiting on Approval from [employer] to see Dr. Vertell Limber  2. Diabetic Neuropathy: Continue Gabapentin.  3. Muscle Spasms: Continue Skelaxin.  4. Left Knee Pain: Continue Current Medication and exercise Regimen.   20 minutes of face to face patient care time was spent during this visit. All questions were encouraged and answered.   F/U in 1 month

## 2014-02-11 ENCOUNTER — Ambulatory Visit: Payer: Self-pay | Admitting: Physical Medicine & Rehabilitation

## 2014-03-03 ENCOUNTER — Ambulatory Visit (HOSPITAL_COMMUNITY): Payer: Self-pay | Admitting: Psychiatry

## 2014-03-03 ENCOUNTER — Ambulatory Visit (INDEPENDENT_AMBULATORY_CARE_PROVIDER_SITE_OTHER): Payer: Medicare Other | Admitting: Psychiatry

## 2014-03-03 VITALS — BP 137/76 | HR 56 | Ht 65.0 in | Wt 220.0 lb

## 2014-03-03 DIAGNOSIS — F0391 Unspecified dementia with behavioral disturbance: Secondary | ICD-10-CM

## 2014-03-03 DIAGNOSIS — F03918 Unspecified dementia, unspecified severity, with other behavioral disturbance: Secondary | ICD-10-CM

## 2014-03-03 MED ORDER — DIVALPROEX SODIUM ER 250 MG PO TB24
ORAL_TABLET | ORAL | Status: DC
Start: 2014-03-03 — End: 2014-03-09

## 2014-03-03 MED ORDER — DONEPEZIL HCL 10 MG PO TABS: ORAL_TABLET | ORAL | Status: DC

## 2014-03-03 NOTE — Progress Notes (Signed)
Psychiatric Assessment Adult  Patient Identification:  Paige Arnold Date of Evaluation:  03/03/2014 Chief Complaint: Do not know why I'm here This patient is a 73 year old white married mother little past psychiatric history. She comes from her primary care doctor's office. According to her there was an incidence where she became explosive incurable and that led to this appointment. The patient was not violent and she raised her voice. The patient has been married for 10 years and loves her husband but has problems communicating and negotiating and respecting him. Hence she is a dysfunctional marriage. She claims her husband also has memory impaired as she says she is as well. The patient has 3 children one grandchild and one great-grandchild. Of her 3 children her daughter, Langley Gauss she's in conflict with. The patient has been retired from the [employer] for 15 years. Financially she stable. There are no deaths in her life in the last year. The patient denies persistent daily depression. She says she has problems sleeping but does not result in daytime dysfunction. In essence she takes no naps and is not sleepy. Her appetite is distinctly increased. The patient denies problems with energy and is concentrating only fairly well. She denies a feeling of worthlessness. She is not suicidal now and never has been. The patient is able to enjoy watching TV and movies. She does not read. She's not suspicious. She does think of herself as being very somatic with multiple complaints. The patient denies the use of alcohol. She's never been psychotic. In a close evaluation the patient denies an episode of major depression ever in her life. She denies any symptoms of mania. What she does describe her husband agrees is that she can become very curable. She claims her debility has gotten much worse and in fact she feels like she is a changed person. Her husband who was in the last part of this evaluation agrees.  Her irritability is very destructive for their relationship. It is not clear how much he contributes to this problem. It is evident the patient does seem to have a dysfunctional marriage. The patient denies symptoms consistent with generalized anxiety disorder, panic disorder or obsessive-compulsive disorder. The patient denies ever having had trauma. The patient denies any physical or sexual abuse as a child. The patient does not smoke cigarettes. She is a 12th grade education. Her sister at age 50 committed suicide. Her expected to be mental status exam given her age and education is 63. Today we performed a Mini-Mental status exam scored a value of 23. The patient takes morphine for her pain syndrome related to her knees. His multiple medical conditions including hypertension diabetes and thyroid disease. Her past psychiatric history is negative for being in a psychiatric hospital. She has seen a psychiatrist in her community just 2 or 3 times but never connected. The patient has never been in psychotherapy. It seems evident that her major problem is that of increasing irritability which can be caused by number of forces/factors in her life. History of Chief Complaint:  No chief complaint on file.   HPI Review of Systems Physical Exam  Depressive Symptoms: increased appetite,  (Hypo) Manic Symptoms:   Elevated Mood:  No Irritable Mood: yes Grandiosity:  No Distractibility:  No Labiality of Mood:  No Delusions:  No Hallucinations:  No Impulsivity:  Yes Sexually Inappropriate Behavior:  No Financial Extravagance:  No Flight of Ideas:  No  Anxiety Symptoms: Excessive Worry:  No Panic Symptoms:  No Agoraphobia:  No Obsessive Compulsive: No  Symptoms: None, Specific Phobias:  No Social Anxiety:  No  Psychotic Symptoms:  Hallucinations: No Visual None Delusions:  No Paranoia:  No   Ideas of Reference:  No  PTSD Symptoms: Ever had a traumatic exposure:  No Had a traumatic  exposure in the last month:  No Re-experiencing: No None Hypervigilance:  No Hyperarousal: No None Avoidance: No None  Traumatic Brain Injury: No   Past Psychiatric History: Diagnosis: None   Past Medical History:   Past Medical History  Diagnosis Date  . Thyroid disorder   . Kidney disease   . Arthritis   . Hypertension   . Diabetes mellitus    History of Loss of Consciousness:  No Seizure History:  No Cardiac History:  No Allergies:   Allergies  Allergen Reactions  . Sulfa Antibiotics Itching   Current Medications:  Current Outpatient Prescriptions  Medication Sig Dispense Refill  . amLODipine (NORVASC) 2.5 MG tablet Take 10 mg by mouth daily.       . Biotin 1 MG CAPS Take by mouth.      Marland Kitchen BLACK COHOSH PO Take 40 mg by mouth daily.      . Calcium Carbonate-Vitamin D (CALTRATE 600+D PO) Take by mouth daily.      . Cholecalciferol (VITAMIN D3) 3000 UNITS TABS Take 1 tablet by mouth daily.      . cloNIDine (CATAPRES) 0.1 MG tablet Take 0.1 mg by mouth at bedtime.      . Cyanocobalamin (VITAMIN B 12 PO) Take by mouth.      . divalproex (DEPAKOTE ER) 250 MG 24 hr tablet 1 qam  For 1 week then 2qam  60 tablet  5  . donepezil (ARICEPT) 10 MG tablet 1 q hs  30 tablet  5  . DULoxetine (CYMBALTA) 30 MG capsule Take 30 mg by mouth daily.      . DULoxetine (CYMBALTA) 60 MG capsule Take 1 capsule (60 mg total) by mouth daily.  30 capsule  3  . furosemide (LASIX) 80 MG tablet Take 80 mg by mouth as needed.      . gabapentin (NEURONTIN) 400 MG capsule TAKE 1 CAPSULE (400 MG TOTAL) BY MOUTH AT BEDTIME.  30 capsule  1  . ibuprofen (ADVIL,MOTRIN) 600 MG tablet Take 600 mg by mouth every 8 (eight) hours as needed for pain.      Marland Kitchen levothyroxine (SYNTHROID, LEVOTHROID) 75 MCG tablet Take 150 mcg by mouth daily before breakfast.      . metaxalone (SKELAXIN) 800 MG tablet TAKE 1 TABLET (800 MG TOTAL) BY MOUTH 3 (THREE) TIMES DAILY.  90 tablet  1  . methylPREDNISolone (MEDROL DOSEPAK) 4 MG  tablet follow package directions  21 tablet  0  . morphine (MSIR) 15 MG tablet Take 1 tablet (15 mg total) by mouth 3 (three) times daily.  90 tablet  0  . Nutritional Supplements (DHEA PO) Take 50 mg by mouth daily.      Marland Kitchen nystatin-triamcinolone ointment (MYCOLOG)       . phenazopyridine (PYRIDIUM) 100 MG tablet Take 100 mg by mouth as needed for pain.      . pravastatin (PRAVACHOL) 20 MG tablet Take 40 mg by mouth daily.       . ranitidine (ZANTAC) 300 MG tablet Take 300 mg by mouth at bedtime.      . sitaGLIPtin (JANUVIA) 100 MG tablet Take 100 mg by mouth daily.      . valsartan (DIOVAN) 80  MG tablet Take 80 mg by mouth daily.       No current facility-administered medications for this visit.    Previous Psychotropic Medications:  Medication Dose   Cymbalta  30  Aricept 5 mg                     Substance Abuse History in the last 12 months: Substance Age of 1st Use Last Use Amount Specific Type   Medical Consequences of Substance Abuse:   Legal Consequences of Substance Abuse:   Family Consequences of Substance Abuse:   Blackouts:   DT's:   Withdrawal Symptoms:     Social History: Current Place of Residence: [place] of Birth:  Family Members:  Marital Status:  Married Children: 3  Sons:   Daughters:  Relationships:  Education:  [employer] Problems/Performance:  Religious Beliefs/Practices:  History of Abuse:  Ship broker History:   Legal History:  Hobbies/Interests:   Family History:   Family History  Problem Relation Age of Onset  . Kidney disease Mother   . Heart disease Father   . Diabetes Brother   . Alcohol abuse Brother   . Depression Brother   . Sarcoidosis Brother   . ALS Brother   . Heart disease Brother     Mental Status Examination/Evaluation: Objective:  Appearance: NA  Eye Contact::  Good  Speech:  Clear and Coherent  Volume:  Normal  Mood:  nl  Affect:  Appropriate  Thought  Process:  Goal Directed  Orientation:  Full (Time, Place, and Person)  Thought Content:  WDL  Suicidal Thoughts:  No  Homicidal Thoughts:  No  Judgement:  Good  Insight:  Good  Psychomotor Activity:  Normal  Akathisia:  No  Handed:  Right  AIMS (if indicated):    Assets:      Laboratory/X-Ray Psychological Evaluation(s)        Assessment:    AXIS I Dementia  With Behavioural Disturbance  AXIS II Deferred  AXIS III Past Medical History  Diagnosis Date  . Thyroid disorder   . Kidney disease   . Arthritis   . Hypertension   . Diabetes mellitus      AXIS IV economic problems  AXIS V 61-70 mild symptoms   Treatment Plan/Recommendations:  Plan of Care: At this time it is evident this patient is experiencing a memory disorder perhaps that of Alzheimer's. It is noted patient is somewhat young to have Alzheimer's. I believe this patient does need a workup. At this time we'll go ahead and increase her Aricept from a dose of 5 mg to 10 mg. At this time I will go ahead and treat her intense your ability which apparently impacts her marriage. The reason for your ability could be from an underlying dementia but could also be due to the dysfunction was in her marriage. The possibility of marital treatment should be considered but for the time being we'll go ahead and start Depakote 250 mg ER to the morning. Her next visit we shall attempt to get her last brain scan as well as get some baseline blood work for dementia workup. This patient to return to see [place] in 2 months.   Laboratory:    Psychotherapy:   Medications: Depakote 250 extended release 2 in the morning, Aricept 10 mg   Routine PRN Medications:    Consultations:   Safety Concerns:    Other:      Haskel Schroeder, MD 8/19/20153:39 PM

## 2014-03-09 ENCOUNTER — Encounter: Payer: Worker's Compensation | Attending: Registered Nurse | Admitting: Registered Nurse

## 2014-03-09 ENCOUNTER — Observation Stay (HOSPITAL_COMMUNITY)
Admission: EM | Admit: 2014-03-09 | Discharge: 2014-03-11 | Disposition: A | Discharge status: Home / Self Care | Payer: Medicare Other | Attending: Internal Medicine | Admitting: Internal Medicine

## 2014-03-09 ENCOUNTER — Encounter: Payer: Self-pay | Admitting: Registered Nurse

## 2014-03-09 ENCOUNTER — Encounter (HOSPITAL_COMMUNITY): Payer: Self-pay | Admitting: Emergency Medicine

## 2014-03-09 VITALS — BP 98/55 | HR 78 | Resp 14 | Ht 65.0 in | Wt 219.0 lb

## 2014-03-09 DIAGNOSIS — Z79899 Other long term (current) drug therapy: Secondary | ICD-10-CM | POA: Insufficient documentation

## 2014-03-09 DIAGNOSIS — M25562 Pain in left knee: Secondary | ICD-10-CM

## 2014-03-09 DIAGNOSIS — Z5181 Encounter for therapeutic drug level monitoring: Secondary | ICD-10-CM

## 2014-03-09 DIAGNOSIS — M25561 Pain in right knee: Secondary | ICD-10-CM

## 2014-03-09 DIAGNOSIS — M48061 Spinal stenosis, lumbar region without neurogenic claudication: Secondary | ICD-10-CM

## 2014-03-09 DIAGNOSIS — R5381 Other malaise: Secondary | ICD-10-CM | POA: Insufficient documentation

## 2014-03-09 DIAGNOSIS — E118 Type 2 diabetes mellitus with unspecified complications: Secondary | ICD-10-CM

## 2014-03-09 DIAGNOSIS — N19 Unspecified kidney failure: Principal | ICD-10-CM | POA: Insufficient documentation

## 2014-03-09 DIAGNOSIS — M7061 Trochanteric bursitis, right hip: Secondary | ICD-10-CM

## 2014-03-09 DIAGNOSIS — E1140 Type 2 diabetes mellitus with diabetic neuropathy, unspecified: Secondary | ICD-10-CM | POA: Diagnosis present

## 2014-03-09 DIAGNOSIS — G8929 Other chronic pain: Secondary | ICD-10-CM

## 2014-03-09 DIAGNOSIS — E079 Disorder of thyroid, unspecified: Secondary | ICD-10-CM | POA: Diagnosis not present

## 2014-03-09 DIAGNOSIS — M129 Arthropathy, unspecified: Secondary | ICD-10-CM | POA: Diagnosis not present

## 2014-03-09 DIAGNOSIS — M545 Low back pain, unspecified: Secondary | ICD-10-CM | POA: Diagnosis present

## 2014-03-09 DIAGNOSIS — I10 Essential (primary) hypertension: Secondary | ICD-10-CM | POA: Diagnosis not present

## 2014-03-09 DIAGNOSIS — R5383 Other fatigue: Secondary | ICD-10-CM

## 2014-03-09 DIAGNOSIS — N183 Chronic kidney disease, stage 3 unspecified: Secondary | ICD-10-CM | POA: Diagnosis present

## 2014-03-09 DIAGNOSIS — N058 Unspecified nephritic syndrome with other morphologic changes: Secondary | ICD-10-CM

## 2014-03-09 DIAGNOSIS — M7062 Trochanteric bursitis, left hip: Secondary | ICD-10-CM

## 2014-03-09 DIAGNOSIS — IMO0002 Reserved for concepts with insufficient information to code with codable children: Secondary | ICD-10-CM | POA: Diagnosis present

## 2014-03-09 DIAGNOSIS — N179 Acute kidney failure, unspecified: Secondary | ICD-10-CM

## 2014-03-09 DIAGNOSIS — M5137 Other intervertebral disc degeneration, lumbosacral region: Secondary | ICD-10-CM

## 2014-03-09 DIAGNOSIS — E1121 Type 2 diabetes mellitus with diabetic nephropathy: Secondary | ICD-10-CM | POA: Diagnosis present

## 2014-03-09 DIAGNOSIS — E039 Hypothyroidism, unspecified: Secondary | ICD-10-CM

## 2014-03-09 DIAGNOSIS — M25569 Pain in unspecified knee: Secondary | ICD-10-CM

## 2014-03-09 DIAGNOSIS — M549 Dorsalgia, unspecified: Secondary | ICD-10-CM | POA: Insufficient documentation

## 2014-03-09 DIAGNOSIS — E1165 Type 2 diabetes mellitus with hyperglycemia: Secondary | ICD-10-CM

## 2014-03-09 DIAGNOSIS — E119 Type 2 diabetes mellitus without complications: Secondary | ICD-10-CM | POA: Diagnosis not present

## 2014-03-09 DIAGNOSIS — E1149 Type 2 diabetes mellitus with other diabetic neurological complication: Secondary | ICD-10-CM

## 2014-03-09 DIAGNOSIS — E1129 Type 2 diabetes mellitus with other diabetic kidney complication: Secondary | ICD-10-CM

## 2014-03-09 DIAGNOSIS — Z760 Encounter for issue of repeat prescription: Secondary | ICD-10-CM

## 2014-03-09 DIAGNOSIS — E1142 Type 2 diabetes mellitus with diabetic polyneuropathy: Secondary | ICD-10-CM

## 2014-03-09 LAB — CBC WITH DIFFERENTIAL/PLATELET
BASOS ABS: 0 10*3/uL (ref 0.0–0.1)
Basophils Relative: 0 % (ref 0–1)
EOS PCT: 3 % (ref 0–5)
Eosinophils Absolute: 0.2 10*3/uL (ref 0.0–0.7)
HCT: 37.8 % (ref 36.0–46.0)
Hemoglobin: 12.3 g/dL (ref 12.0–15.0)
LYMPHS ABS: 1.2 10*3/uL (ref 0.7–4.0)
LYMPHS PCT: 13 % (ref 12–46)
MCH: 30.4 pg (ref 26.0–34.0)
MCHC: 32.5 g/dL (ref 30.0–36.0)
MCV: 93.3 fL (ref 78.0–100.0)
Monocytes Absolute: 0.6 10*3/uL (ref 0.1–1.0)
Monocytes Relative: 6 % (ref 3–12)
NEUTROS ABS: 7.6 10*3/uL (ref 1.7–7.7)
NEUTROS PCT: 78 % — AB (ref 43–77)
Platelets: 225 10*3/uL (ref 150–400)
RBC: 4.05 MIL/uL (ref 3.87–5.11)
RDW: 13.2 % (ref 11.5–15.5)
WBC: 9.6 10*3/uL (ref 4.0–10.5)

## 2014-03-09 LAB — URINALYSIS, ROUTINE W REFLEX MICROSCOPIC
Bilirubin Urine: NEGATIVE
Glucose, UA: NEGATIVE mg/dL
Hgb urine dipstick: NEGATIVE
KETONES UR: NEGATIVE mg/dL
NITRITE: NEGATIVE
Protein, ur: 30 mg/dL — AB
SPECIFIC GRAVITY, URINE: 1.009 (ref 1.005–1.030)
Urobilinogen, UA: 0.2 mg/dL (ref 0.0–1.0)
pH: 7.5 (ref 5.0–8.0)

## 2014-03-09 LAB — BASIC METABOLIC PANEL
ANION GAP: 15 (ref 5–15)
BUN: 54 mg/dL — ABNORMAL HIGH (ref 6–23)
CO2: 31 meq/L (ref 19–32)
Calcium: 10.1 mg/dL (ref 8.4–10.5)
Chloride: 95 mEq/L — ABNORMAL LOW (ref 96–112)
Creatinine, Ser: 2.94 mg/dL — ABNORMAL HIGH (ref 0.50–1.10)
GFR calc Af Amer: 17 mL/min — ABNORMAL LOW (ref >90)
GFR calc non Af Amer: 15 mL/min — ABNORMAL LOW (ref >90)
GLUCOSE: 118 mg/dL — AB (ref 70–99)
POTASSIUM: 4.2 meq/L (ref 3.7–5.3)
SODIUM: 141 meq/L (ref 137–147)

## 2014-03-09 LAB — HEMOGLOBIN A1C
HEMOGLOBIN A1C: 6.3 % — AB (ref <5.7)
MEAN PLASMA GLUCOSE: 134 mg/dL — AB (ref <117)

## 2014-03-09 LAB — GLUCOSE, CAPILLARY
Glucose-Capillary: 102 mg/dL — ABNORMAL HIGH (ref 70–99)
Glucose-Capillary: 126 mg/dL — ABNORMAL HIGH (ref 70–99)

## 2014-03-09 LAB — URINE MICROSCOPIC-ADD ON

## 2014-03-09 LAB — TROPONIN I

## 2014-03-09 LAB — TSH: TSH: 0.324 u[IU]/mL — AB (ref 0.350–4.500)

## 2014-03-09 LAB — VALPROIC ACID LEVEL

## 2014-03-09 MED ORDER — LEVOTHYROXINE SODIUM 150 MCG PO TABS
150.0000 ug | ORAL_TABLET | Freq: Every day | ORAL | Status: DC
  Administered 2014-03-10 – 2014-03-11 (×2): 150 ug via ORAL
  Filled 2014-03-09 (×3): qty 1

## 2014-03-09 MED ORDER — DONEPEZIL HCL 10 MG PO TABS
10.0000 mg | ORAL_TABLET | Freq: Every day | ORAL | Status: DC
  Administered 2014-03-09 – 2014-03-10 (×2): 10 mg via ORAL
  Filled 2014-03-09 (×3): qty 1

## 2014-03-09 MED ORDER — SODIUM CHLORIDE 0.9 % IV BOLUS (SEPSIS)
1000.0000 mL | Freq: Once | INTRAVENOUS | Status: AC
  Administered 2014-03-09: 1000 mL via INTRAVENOUS

## 2014-03-09 MED ORDER — INSULIN ASPART 100 UNIT/ML ~~LOC~~ SOLN
3.0000 [IU] | Freq: Three times a day (TID) | SUBCUTANEOUS | Status: DC
  Administered 2014-03-10 – 2014-03-11 (×3): 3 [IU] via SUBCUTANEOUS

## 2014-03-09 MED ORDER — VITAMIN D3 25 MCG (1000 UNIT) PO TABS
3000.0000 [IU] | ORAL_TABLET | Freq: Every day | ORAL | Status: DC
  Administered 2014-03-10 – 2014-03-11 (×2): 3000 [IU] via ORAL
  Filled 2014-03-09 (×2): qty 3

## 2014-03-09 MED ORDER — MORPHINE SULFATE 4 MG/ML IJ SOLN: 4.0000 mg | INTRAMUSCULAR | Status: DC | PRN

## 2014-03-09 MED ORDER — ACETAMINOPHEN 650 MG RE SUPP: 650.0000 mg | Freq: Four times a day (QID) | RECTAL | Status: DC | PRN

## 2014-03-09 MED ORDER — INSULIN ASPART 100 UNIT/ML ~~LOC~~ SOLN: 0.0000 [IU] | Freq: Every day | SUBCUTANEOUS | Status: DC

## 2014-03-09 MED ORDER — ACETAMINOPHEN 325 MG PO TABS
650.0000 mg | ORAL_TABLET | Freq: Four times a day (QID) | ORAL | Status: DC | PRN
  Administered 2014-03-11: 650 mg via ORAL
  Filled 2014-03-09: qty 2

## 2014-03-09 MED ORDER — MORPHINE SULFATE 15 MG PO TABS
15.0000 mg | ORAL_TABLET | Freq: Three times a day (TID) | ORAL | Status: DC
  Administered 2014-03-09 – 2014-03-11 (×6): 15 mg via ORAL
  Filled 2014-03-09 (×6): qty 1

## 2014-03-09 MED ORDER — INSULIN ASPART 100 UNIT/ML ~~LOC~~ SOLN
0.0000 [IU] | Freq: Three times a day (TID) | SUBCUTANEOUS | Status: DC
  Administered 2014-03-10 – 2014-03-11 (×2): 1 [IU] via SUBCUTANEOUS

## 2014-03-09 MED ORDER — GABAPENTIN 400 MG PO CAPS
400.0000 mg | ORAL_CAPSULE | Freq: Every day | ORAL | Status: DC
  Administered 2014-03-09 – 2014-03-10 (×2): 400 mg via ORAL
  Filled 2014-03-09 (×3): qty 1

## 2014-03-09 MED ORDER — POLYETHYLENE GLYCOL 3350 17 G PO PACK
17.0000 g | PACK | Freq: Every day | ORAL | Status: DC | PRN
  Filled 2014-03-09: qty 1

## 2014-03-09 MED ORDER — SIMVASTATIN 10 MG PO TABS
10.0000 mg | ORAL_TABLET | Freq: Every day | ORAL | Status: DC
  Administered 2014-03-09 – 2014-03-10 (×2): 10 mg via ORAL
  Filled 2014-03-09 (×3): qty 1

## 2014-03-09 MED ORDER — ONDANSETRON HCL 4 MG PO TABS: 4.0000 mg | ORAL_TABLET | Freq: Four times a day (QID) | ORAL | Status: DC | PRN

## 2014-03-09 MED ORDER — DULOXETINE HCL 60 MG PO CPEP: 60.0000 mg | ORAL_CAPSULE | Freq: Every day | ORAL | Status: DC

## 2014-03-09 MED ORDER — SODIUM CHLORIDE 0.9 % IV SOLN
INTRAVENOUS | Status: DC
  Administered 2014-03-09: 16:00:00 via INTRAVENOUS

## 2014-03-09 MED ORDER — ONDANSETRON HCL 4 MG/2ML IJ SOLN: 4.0000 mg | Freq: Four times a day (QID) | INTRAMUSCULAR | Status: DC | PRN

## 2014-03-09 MED ORDER — DULOXETINE HCL 30 MG PO CPEP
30.0000 mg | ORAL_CAPSULE | Freq: Every day | ORAL | Status: DC
  Administered 2014-03-09 – 2014-03-11 (×3): 30 mg via ORAL
  Filled 2014-03-09 (×3): qty 1

## 2014-03-09 MED ORDER — MORPHINE SULFATE 15 MG PO TABS: 15.0000 mg | ORAL_TABLET | Freq: Three times a day (TID) | ORAL | Status: DC

## 2014-03-09 MED ORDER — HEPARIN SODIUM (PORCINE) 5000 UNIT/ML IJ SOLN
5000.0000 [IU] | Freq: Three times a day (TID) | INTRAMUSCULAR | Status: DC
  Administered 2014-03-09 – 2014-03-11 (×5): 5000 [IU] via SUBCUTANEOUS
  Filled 2014-03-09 (×8): qty 1

## 2014-03-09 MED ORDER — SODIUM CHLORIDE 0.9 % IV SOLN
INTRAVENOUS | Status: DC
  Administered 2014-03-10 (×2): via INTRAVENOUS

## 2014-03-09 MED ORDER — DIVALPROEX SODIUM ER 250 MG PO TB24
250.0000 mg | ORAL_TABLET | Freq: Every day | ORAL | Status: DC
  Administered 2014-03-09 – 2014-03-11 (×3): 250 mg via ORAL
  Filled 2014-03-09 (×3): qty 1

## 2014-03-09 NOTE — ED Notes (Signed)
Pt was at pain clinic today and while there she states the doctor noted a decrease in BP. She states she felt generalized weakness since Saturday. Currently she has left sided knee pain that radiates down her leg. She has taken her Morphine today at 0800. VS stable. No acute distress.

## 2014-03-09 NOTE — ED Notes (Signed)
Per EMS pt was picked up from pain management clinic for c/o "not feeling well" and "feeling faint" since Saturday, pt also reports starting depakote and aricept on Saturday and is not certain if that's what's causing symptoms. Per EMS pt also has hx of behavioral issues and is currently under a lot of stress. EMS reports pt is very anxious.

## 2014-03-09 NOTE — H&P (Addendum)
[facility] History and Physical  Paige Arnold [id number] DOB: 1940/11/18 DOA: 03/09/2014  Referring physician: Dr. Zenia Resides PCP: Angelina Sheriff., MD   Chief Complaint: weakness and dizzy upon standing  HPI: Paige Arnold is a 73 y.o. female  With past medical history of chronic renal disease stage III (confirm it but the patient relates is usually around 2) she follows up with a nephrologist, diabetes and an unknown hemoglobin A1c, hypothyroidism currently not on thyroid replacement therapy, hypertension and chronic back problems on opioids that comes in for generalized weakness and fainting since Saturday. She went to see her primary care doctor on the day of admission was found to be hypotensive so we'll refer to the ED. Patient denies any chest pain, nausea, vomiting, shortness of breath or abdominal pain. She continues to take her blood pressure medication. She's currently on Lasix and ACE, she also has been taking ibuprofen for pain . She also relates some dizziness upon standing that started today prior to admission.  In the ED: A basic metabolic panel was done as below, CBC. Caution was given a liter of normal saline the emergency room.   Review of Systems:  Constitutional:  No weight loss, night sweats, Fevers, chills, fatigue.  HEENT:  No headaches, Difficulty swallowing,Tooth/dental problems,Sore throat,  No sneezing, itching, ear ache, nasal congestion, post nasal drip,  Cardio-vascular:  No chest pain, Orthopnea, PND, swelling in lower extremities, anasarca, dizziness, palpitations  GI:  No heartburn, indigestion, abdominal pain, nausea, vomiting, diarrhea, change in bowel habits, loss of appetite  Resp:  No shortness of breath with exertion or at rest. No excess mucus, no productive cough, No non-productive cough, No coughing up of blood.No change in color of mucus.No wheezing.No chest wall deformity  Skin:  no rash or lesions.  GU:  no dysuria, change in color  of urine, no urgency or frequency. No flank pain.  Musculoskeletal:  No joint pain or swelling. No decreased range of motion. No back pain.  Psych:  No change in mood or affect. No depression or anxiety. No memory loss.   Past Medical History  Diagnosis Date  . Thyroid disorder   . Kidney disease   . Arthritis   . Hypertension   . Diabetes mellitus    Past Surgical History  Procedure Laterality Date  . Tubal ligation    . Temporomandibular joint surgery    . Partial knee arthroplasty    . Cholecystectomy    . Intraocular lens insertion Right 10-21-13   Social History:  reports that she has never smoked. She has never used smokeless tobacco. She reports that she does not drink alcohol or use illicit drugs.  Allergies  Allergen Reactions  . Sulfa Antibiotics Itching    Family History  Problem Relation Age of Onset  . Kidney disease Mother   . Heart disease Father   . Diabetes Brother   . Alcohol abuse Brother   . Depression Brother   . Sarcoidosis Brother   . ALS Brother   . Heart disease Brother      Prior to Admission medications   Medication Sig Start Date End Date Taking? Authorizing Provider  amLODipine (NORVASC) 2.5 MG tablet Take 10 mg by mouth daily.     Historical Provider, MD  Biotin 1 MG CAPS Take by mouth.    Historical Provider, MD  BLACK COHOSH PO Take 40 mg by mouth daily.    Historical Provider, MD  Calcium Carbonate-Vitamin D (CALTRATE 600+D PO) Take  by mouth daily.    Historical Provider, MD  Cholecalciferol (VITAMIN D3) 3000 UNITS TABS Take 1 tablet by mouth daily.    Historical Provider, MD  cloNIDine (CATAPRES) 0.1 MG tablet Take 0.1 mg by mouth at bedtime.    Historical Provider, MD  Cyanocobalamin (VITAMIN B 12 PO) Take by mouth.    Historical Provider, MD  divalproex (DEPAKOTE ER) 250 MG 24 hr tablet 1 qam  For 1 week then 2qam 03/03/14   Norma Fredrickson, MD  donepezil (ARICEPT) 10 MG tablet 1 q hs 03/03/14   Norma Fredrickson, MD  DULoxetine  (CYMBALTA) 30 MG capsule Take 30 mg by mouth daily.    Historical Provider, MD  DULoxetine (CYMBALTA) 60 MG capsule Take 1 capsule (60 mg total) by mouth daily. 08/18/13   Charlett Blake, MD  furosemide (LASIX) 80 MG tablet Take 80 mg by mouth as needed.    Historical Provider, MD  gabapentin (NEURONTIN) 400 MG capsule TAKE 1 CAPSULE (400 MG TOTAL) BY MOUTH AT BEDTIME.    Charlett Blake, MD  ibuprofen (ADVIL,MOTRIN) 600 MG tablet Take 600 mg by mouth every 8 (eight) hours as needed for pain.    Historical Provider, MD  levothyroxine (SYNTHROID, LEVOTHROID) 75 MCG tablet Take 150 mcg by mouth daily before breakfast. 03/25/13   Renato Shin, MD  metaxalone (SKELAXIN) 800 MG tablet TAKE 1 TABLET (800 MG TOTAL) BY MOUTH 3 (THREE) TIMES DAILY. 01/25/14   Danella Sensing, NP  morphine (MSIR) 15 MG tablet Take 1 tablet (15 mg total) by mouth 3 (three) times daily. 03/09/14   Danella Sensing, NP  Nutritional Supplements (DHEA PO) Take 50 mg by mouth daily.    Historical Provider, MD  nystatin-triamcinolone ointment Lilyan Gilford)  06/05/12   Historical Provider, MD  phenazopyridine (PYRIDIUM) 100 MG tablet Take 100 mg by mouth as needed for pain.    Historical Provider, MD  pravastatin (PRAVACHOL) 20 MG tablet Take 40 mg by mouth daily.     Historical Provider, MD  ranitidine (ZANTAC) 300 MG tablet Take 300 mg by mouth at bedtime.    Historical Provider, MD  sitaGLIPtin (JANUVIA) 100 MG tablet Take 100 mg by mouth daily.    Historical Provider, MD  valsartan (DIOVAN) 80 MG tablet Take 80 mg by mouth daily.    Historical Provider, MD   Physical Exam: Filed Vitals:   03/09/14 1209 03/09/14 1211 03/09/14 1406 03/09/14 1426  BP:  113/69 133/69 133/69  Pulse:  71 60 63  Temp:  97.4 F (36.3 C)    TempSrc:  Oral    Resp:  18 9   SpO2: 96% 99% 93% 93%    Wt Readings from Last 3 Encounters:  03/09/14 99.338 kg (219 lb)  03/03/14 99.791 kg (220 lb)  02/05/14 97.977 kg (216 lb)    General:  Appears calm  and comfortable Eyes: PERRL, normal lids, irises & conjunctiva ENT: grossly normal hearing, lips & tongue Neck: no LAD, masses or thyromegaly Cardiovascular: RRR, no m/r/g. No LE edema. Telemetry: SR, no arrhythmias  Respiratory: CTA bilaterally, no w/r/r. Normal respiratory effort. Abdomen: soft, ntnd Skin: no rash or induration seen on limited exam Musculoskeletal: grossly normal tone BUE/BLE Psychiatric: grossly normal mood and affect, speech fluent and appropriate Neurologic: grossly non-focal.          Labs on Admission:  Basic Metabolic Panel:  Recent Labs Lab 03/09/14 1359  NA 141  K 4.2  CL 95*  CO2 31  GLUCOSE 118*  BUN 54*  CREATININE 2.94*  CALCIUM 10.1   Liver Function Tests: No results found for this basename: AST, ALT, ALKPHOS, BILITOT, PROT, ALBUMIN,  in the last 168 hours No results found for this basename: LIPASE, AMYLASE,  in the last 168 hours No results found for this basename: AMMONIA,  in the last 168 hours CBC:  Recent Labs Lab 03/09/14 1359  WBC 9.6  NEUTROABS 7.6  HGB 12.3  HCT 37.8  MCV 93.3  PLT 225   Cardiac Enzymes:  Recent Labs Lab 03/09/14 1359  TROPONINI <0.30    BNP (last 3 results) No results found for this basename: PROBNP,  in the last 8760 hours CBG: No results found for this basename: GLUCAP,  in the last 168 hours  Radiological Exams on Admission: No results found.  EKG: Independently reviewed. Normal sinus rhythm short PR normal axis nonspecific T-wave changes  Assessment/Plan Acute on chronic kidney injury, orthostatic hypotension/hypertension: - Unknown baseline creatinine, patient relates is around 2. - She got a liter of normal saline in the emergency room. - Chloride is low with HCO3 of 31 which point to prerenal. Check orthostatics. - Multifactorial, she has been started on Lasix every day 80 mg, has been using angiotensin receptor blockers, and ibuprofen all of which can contribute to her acute kidney  injury. - Check urinary sodium and urinary creatinine, hold her ARB and her diuretic, start her on IV fluids monitor strict I.'s and O. check a basic metabolic panel in the morning. - Check orthostatic vitals, she really got a liter of normal saline in the ED.  Chronic low back pain - Continue home dose narcotics, DC ibuprofen.   Diabetic neuropathy, painful - Continue the Neurontin  Unspecified essential hypertension: - The ACE inhibitor, Lasix,clonidine and Norvasc - A low blood pressure to improve before restarting.  Diabetes mellitus type 2 with complications, uncontrolled - Continue insulin plus sliding scale. - Hold oral hypoglycemics.  Hypothyroidism: - Check a TSH continue Synthroid.  Code Status: full DVT Prophylaxis:Heparin Family Communication: husband Disposition Plan: inpatient  Time spent: 38 minutes  Charlynne Cousins [facility] Pager [phone]  **Disclaimer: This note may have been dictated with voice recognition software. Similar sounding words can inadvertently be transcribed and this note may contain transcription errors which may not have been corrected upon publication of note.**

## 2014-03-09 NOTE — ED Notes (Signed)
Bed: [id number] Expected date:  Expected time:  Means of arrival:  Comments: ems

## 2014-03-09 NOTE — Addendum Note (Signed)
Addended by: Jules Schick on: 03/09/2014 02:27 PM   Modules accepted: Orders

## 2014-03-09 NOTE — ED Provider Notes (Signed)
CSN: [id number]     Arrival date & time 03/09/14  1208 History   First MD Initiated Contact with Patient 03/09/14 1303     Chief Complaint  Patient presents with  . Fatigue  . Back Pain     (Consider location/radiation/quality/duration/timing/severity/associated sxs/prior Treatment) HPI Comments: Patient here complaining of weakness and feeling faint since Saturday. He states that she started taking Depakote and Aricept which are new for her. Saw her Dr. today was noted to be hypotensive and sent here for further management. Denies any chest pain or shortness of breath. Denies abdominal pain. Denies any recent changes to her hypertensive medications. No recent fever or chills. No recent black or bloody stools. No treatment used for this prior to arrival.  Patient is a 73 y.o. female presenting with back pain. The history is provided by the patient.  Back Pain   Past Medical History  Diagnosis Date  . Thyroid disorder   . Kidney disease   . Arthritis   . Hypertension   . Diabetes mellitus    Past Surgical History  Procedure Laterality Date  . Tubal ligation    . Temporomandibular joint surgery    . Partial knee arthroplasty    . Cholecystectomy    . Intraocular lens insertion Right 10-21-13   Family History  Problem Relation Age of Onset  . Kidney disease Mother   . Heart disease Father   . Diabetes Brother   . Alcohol abuse Brother   . Depression Brother   . Sarcoidosis Brother   . ALS Brother   . Heart disease Brother    History  Substance Use Topics  . Smoking status: Never Smoker   . Smokeless tobacco: Never Used  . Alcohol Use: No   OB History   Grav Para Term Preterm Abortions TAB SAB Ect Mult Living                 Review of Systems  Musculoskeletal: Positive for back pain.  All other systems reviewed and are negative.     Allergies  Sulfa antibiotics  Home Medications   Prior to Admission medications   Medication Sig Start Date End Date Taking?  Authorizing Provider  amLODipine (NORVASC) 2.5 MG tablet Take 10 mg by mouth daily.     Historical Provider, MD  Biotin 1 MG CAPS Take by mouth.    Historical Provider, MD  BLACK COHOSH PO Take 40 mg by mouth daily.    Historical Provider, MD  Calcium Carbonate-Vitamin D (CALTRATE 600+D PO) Take by mouth daily.    Historical Provider, MD  Cholecalciferol (VITAMIN D3) 3000 UNITS TABS Take 1 tablet by mouth daily.    Historical Provider, MD  cloNIDine (CATAPRES) 0.1 MG tablet Take 0.1 mg by mouth at bedtime.    Historical Provider, MD  Cyanocobalamin (VITAMIN B 12 PO) Take by mouth.    Historical Provider, MD  divalproex (DEPAKOTE ER) 250 MG 24 hr tablet 1 qam  For 1 week then 2qam 03/03/14   Norma Fredrickson, MD  donepezil (ARICEPT) 10 MG tablet 1 q hs 03/03/14   Norma Fredrickson, MD  DULoxetine (CYMBALTA) 30 MG capsule Take 30 mg by mouth daily.    Historical Provider, MD  DULoxetine (CYMBALTA) 60 MG capsule Take 1 capsule (60 mg total) by mouth daily. 08/18/13   Charlett Blake, MD  furosemide (LASIX) 80 MG tablet Take 80 mg by mouth as needed.    Historical Provider, MD  gabapentin (NEURONTIN) 400 MG capsule  TAKE 1 CAPSULE (400 MG TOTAL) BY MOUTH AT BEDTIME.    Charlett Blake, MD  ibuprofen (ADVIL,MOTRIN) 600 MG tablet Take 600 mg by mouth every 8 (eight) hours as needed for pain.    Historical Provider, MD  levothyroxine (SYNTHROID, LEVOTHROID) 75 MCG tablet Take 150 mcg by mouth daily before breakfast. 03/25/13   Renato Shin, MD  metaxalone (SKELAXIN) 800 MG tablet TAKE 1 TABLET (800 MG TOTAL) BY MOUTH 3 (THREE) TIMES DAILY. 01/25/14   Danella Sensing, NP  morphine (MSIR) 15 MG tablet Take 1 tablet (15 mg total) by mouth 3 (three) times daily. 02/05/14   Danella Sensing, NP  Nutritional Supplements (DHEA PO) Take 50 mg by mouth daily.    Historical Provider, MD  nystatin-triamcinolone ointment Lilyan Gilford)  06/05/12   Historical Provider, MD  phenazopyridine (PYRIDIUM) 100 MG tablet Take 100 mg by  mouth as needed for pain.    Historical Provider, MD  pravastatin (PRAVACHOL) 20 MG tablet Take 40 mg by mouth daily.     Historical Provider, MD  ranitidine (ZANTAC) 300 MG tablet Take 300 mg by mouth at bedtime.    Historical Provider, MD  sitaGLIPtin (JANUVIA) 100 MG tablet Take 100 mg by mouth daily.    Historical Provider, MD  valsartan (DIOVAN) 80 MG tablet Take 80 mg by mouth daily.    Historical Provider, MD   BP 113/69  Pulse 71  Temp(Src) 97.4 F (36.3 C) (Oral)  Resp 18  SpO2 99% Physical Exam  Nursing note and vitals reviewed. Constitutional: She is oriented to person, place, and time. She appears well-developed and well-nourished.  Non-toxic appearance. No distress.  HENT:  Head: Normocephalic and atraumatic.  Eyes: Conjunctivae, EOM and lids are normal. Pupils are equal, round, and reactive to light.  Neck: Normal range of motion. Neck supple. No tracheal deviation present. No mass present.  Cardiovascular: Normal rate, regular rhythm and normal heart sounds.  Exam reveals no gallop.   No murmur heard. Pulmonary/Chest: Effort normal and breath sounds normal. No stridor. No respiratory distress. She has no decreased breath sounds. She has no wheezes. She has no rhonchi. She has no rales.  Abdominal: Soft. Normal appearance and bowel sounds are normal. She exhibits no distension. There is no tenderness. There is no rebound and no CVA tenderness.  Musculoskeletal: Normal range of motion. She exhibits no edema and no tenderness.  Neurological: She is alert and oriented to person, place, and time. She has normal strength. No cranial nerve deficit or sensory deficit. GCS eye subscore is 4. GCS verbal subscore is 5. GCS motor subscore is 6.  Skin: Skin is warm and dry. No abrasion and no rash noted.  Psychiatric: She has a normal mood and affect. Her speech is normal and behavior is normal.    ED Course  Procedures (including critical care time) Labs Review Labs Reviewed   TROPONIN I  CBC WITH DIFFERENTIAL  BASIC METABOLIC PANEL  VALPROIC ACID LEVEL    Imaging Review No results found.   EKG Interpretation None      MDM   Final diagnoses:  None    Pt to be admitted for eval of renal failure    Leota Jacobsen, MD 03/09/14 1544

## 2014-03-09 NOTE — Progress Notes (Signed)
Utilization Review completed.  Usher Hedberg RN CM  

## 2014-03-09 NOTE — Progress Notes (Signed)
Subjective:    Patient ID: Paige Arnold, female    DOB: [birth year], 73 y.o.   MRN: [id number]  HPI: [name] is a 73 year old female who returns for follow up for chronic pain and medication refill. She arrived to the office hypotensive 98/55. She says when she woke up on Saturday 8/ 22/2015 she was dizzy, jittery and couldn't hardly walk. Her family had to get her walker. She denies any Chest Pain or SOB. She didn't call her PCP. She says she started feeling a little better on Monday On 03/03/2014 she was seen by Dr. Norma Arnold  He prescribed her Depakote 250 mg and Aricept 10 mg QHS. When she was placed on the examining table to check her blood pressure, her blood pressure lying down was 130/79. When I was assisting Paige Arnold to sit up she says she couldn't sit up and leaned back. She did not lose any consciousness. Blood Pressure was rechecked twice 174/90 and 156/97.  She became very teaful and says she is very dizzy. She says "I don't feel well". EMS called, we called her husband he came in the room. Patient will be transfer to [facility] for Evaluation. Mr. And Paige Arnold was instructed to come to the office to pick up her prescription after she has been evaluated. If they decide not to admit her. They verbalized understanding.  Pain Inventory Average Pain 7 Pain Right Now 7 My pain is sharp, dull, stabbing, tingling and aching  In the last 24 hours, has pain interfered with the following? General activity 1 Relation with others 1 Enjoyment of life 3 What TIME of day is your pain at its worst? constant all day Sleep (in general) Poor  Pain is worse with: walking, bending, sitting and standing Pain improves with: rest and heat/ice Relief from Meds: 7  Mobility walk with assistance use a cane ability to climb steps?  yes needs help with transfers  Function disabled: date disabled na retired I need assistance with the following:  meal prep, household duties and  shopping  Neuro/Psych weakness numbness tremor tingling trouble walking dizziness depression anxiety  Prior Studies Any changes since last visit?  no  Physicians involved in your care Any changes since last visit?  no   Family History  Problem Relation Age of Onset  . Kidney disease Mother   . Heart disease Father   . Diabetes Brother   . Alcohol abuse Brother   . Depression Brother   . Sarcoidosis Brother   . ALS Brother   . Heart disease Brother    History   Social History  . Marital Status: Married    Spouse Name: N/A    Number of Children: N/A  . Years of Education: N/A   Social History Main Topics  . Smoking status: Never Smoker   . Smokeless tobacco: Never Used  . Alcohol Use: No  . Drug Use: No  . Sexual Activity: None   Other Topics Concern  . None   Social History Narrative  . None   Past Surgical History  Procedure Laterality Date  . Tubal ligation    . Temporomandibular joint surgery    . Partial knee arthroplasty    . Cholecystectomy    . Intraocular lens insertion Right 10-21-13   Past Medical History  Diagnosis Date  . Thyroid disorder   . Kidney disease   . Arthritis   . Hypertension   . Diabetes mellitus  BP 98/55  Pulse 78  Resp 14  Ht 5\' 5"  (1.651 m)  Wt 219 lb (99.338 kg)  BMI 36.44 kg/m2  SpO2 97%  Opioid Risk Score:   Fall Risk Score: Moderate Fall Risk (6-13 points) (pt educated declined handout)    Review of Systems  Constitutional: Positive for unexpected weight change.  Respiratory: Positive for apnea.   Gastrointestinal: Positive for constipation.  Endocrine:       High blood sugar  Genitourinary: Positive for dysuria.  Musculoskeletal: Positive for back pain and gait problem.  Neurological: Positive for dizziness, tremors, weakness and numbness.       Tingling  Psychiatric/Behavioral: The patient is nervous/anxious.        Depression  All other systems reviewed and are negative.      Objective:     Physical Exam  Nursing note and vitals reviewed. Constitutional: She is oriented to person, place, and time. She appears well-developed.  HENT:  Head: Normocephalic and atraumatic.  Neck: Normal range of motion. Neck supple.  Cardiovascular: Normal rate and regular rhythm.   Pulmonary/Chest: Effort normal and breath sounds normal.  Musculoskeletal:  Unable to Asess: EMS called sent to [facility] for Evaluation  Neurological: She is alert and oriented to person, place, and time.  Skin: Skin is warm and dry.  Psychiatric: She has a normal mood and affect.          Assessment & Plan:  1. Spinal stenosis chronic low back pain radiating into the legs: Continue MSIR 15 mg take one tablet by mouth three times daily #90 . No script given at this time. Sent to [facility] for Evaluation. 2. Diabetic Neuropathy: Continue Gabapentin.  3. Muscle Spasms: Continue Skelaxin.  4. Left Knee Pain: Continue Current Medication and exercise Regimen.   45 minutes of face to face patient care time was spent during this visit. All questions were encouraged and answered.   F/U in 1 month

## 2014-03-10 ENCOUNTER — Ambulatory Visit: Payer: Self-pay | Admitting: Endocrinology

## 2014-03-10 DIAGNOSIS — N19 Unspecified kidney failure: Principal | ICD-10-CM

## 2014-03-10 LAB — GLUCOSE, CAPILLARY
GLUCOSE-CAPILLARY: 96 mg/dL (ref 70–99)
Glucose-Capillary: 105 mg/dL — ABNORMAL HIGH (ref 70–99)
Glucose-Capillary: 145 mg/dL — ABNORMAL HIGH (ref 70–99)
Glucose-Capillary: 126 mg/dL — ABNORMAL HIGH (ref 70–99)

## 2014-03-10 LAB — URINE CULTURE
COLONY COUNT: NO GROWTH
Culture: NO GROWTH

## 2014-03-10 LAB — BASIC METABOLIC PANEL
ANION GAP: 11 (ref 5–15)
BUN: 45 mg/dL — ABNORMAL HIGH (ref 6–23)
CHLORIDE: 101 meq/L (ref 96–112)
CO2: 31 mEq/L (ref 19–32)
Calcium: 9.3 mg/dL (ref 8.4–10.5)
Creatinine, Ser: 2.43 mg/dL — ABNORMAL HIGH (ref 0.50–1.10)
GFR, EST AFRICAN AMERICAN: 22 mL/min — AB (ref >90)
GFR, EST NON AFRICAN AMERICAN: 19 mL/min — AB (ref >90)
Glucose, Bld: 126 mg/dL — ABNORMAL HIGH (ref 70–99)
POTASSIUM: 4 meq/L (ref 3.7–5.3)
Sodium: 143 mEq/L (ref 137–147)

## 2014-03-10 LAB — SODIUM, URINE, RANDOM: Sodium, Ur: 44 meq/L

## 2014-03-10 LAB — CREATININE, URINE, RANDOM: Creatinine, Urine: 48.1 mg/dL

## 2014-03-10 NOTE — Evaluation (Signed)
Physical Therapy Evaluation Patient Details Name: Paige Arnold MRN: [id number] DOB: [birth year] Today's Date: 04-15-2014   History of Present Illness  73 yo female admitted with acute kidney injury, L LE/knee pain,weakness, dizziness.   Clinical Impression  On eval, pt required Min assist for mobility-able to ambulate ~150 feet with walker. Pt c/o dizziness with initial standing. After seated rest break, pt reported dizziness had resolved and she was able to progress activity. Pt denied dizziness during ambulation. Recommend 24 hour supervision initially for safety.     Follow Up Recommendations No PT follow up;Supervision/Assistance - 24 hour (initally once home due to dizziness)    Equipment Recommendations  None recommended by PT    Recommendations for Other Services       Precautions / Restrictions Precautions Precautions: Fall Precaution Comments: dizziness Restrictions Weight Bearing Restrictions: No      Mobility  Bed Mobility Overal bed mobility: Needs Assistance Bed Mobility: Supine to Sit     Supine to sit: Modified independent (Device/Increase time)        Transfers Overall transfer level: Needs assistance   Transfers: Sit to/from Stand Sit to Stand: Min assist         General transfer comment: assist to rise, stabilize, control descent. Pt reported dizziness with initial standing. Immediately assisted pt back to seated position.   Ambulation/Gait Ambulation/Gait assistance: Min guard Ambulation Distance (Feet): 150 Feet Assistive device: Rolling walker (2 wheeled) Gait Pattern/deviations: Step-through pattern     General Gait Details: slow gait speed. fatigues fairly easily. distance also limited by knee pain. followed with recliner for safety. pt denied dizziness during walk.   Stairs            Wheelchair Mobility    Modified Rankin (Stroke Patients Only)       Balance                                              Pertinent Vitals/Pain Pain Assessment: 0-10 Pain Score: 2  Pain Location: L knee-chronnic Pain Intervention(s): Limited activity within patient's tolerance    Home Living Family/patient expects to be discharged to:: Private residence Living Arrangements: Spouse/significant other   Type of Home: House Home Access: Stairs to enter Entrance Stairs-Rails: Right Entrance Stairs-Number of Steps: 6 Home Layout: One level Home Equipment: Environmental consultant - 2 wheels;Cane - single point;Shower seat - built in      Prior Function Level of Independence: Independent with assistive device(s)         Comments: using cane usually up until last couple of days     Hand Dominance        Extremity/Trunk Assessment   Upper Extremity Assessment: Overall WFL for tasks assessed           Lower Extremity Assessment: Generalized weakness      Cervical / Trunk Assessment: Normal  Communication   Communication: No difficulties  Cognition Arousal/Alertness: Awake/alert Behavior During Therapy: WFL for tasks assessed/performed Overall Cognitive Status: Within Functional Limits for tasks assessed                      General Comments      Exercises        Assessment/Plan    PT Assessment Patient needs continued PT services  PT Diagnosis Difficulty walking;Generalized weakness   PT Problem List Decreased strength;Decreased activity tolerance;Decreased  balance;Decreased mobility;Pain;Decreased knowledge of use of DME  PT Treatment Interventions DME instruction;Gait training;Functional mobility training;Therapeutic activities;Patient/family education;Balance training;Therapeutic exercise   PT Goals (Current goals can be found in the Care Plan section) Acute Rehab PT Goals Patient Stated Goal: get better. home soon PT Goal Formulation: With patient Time For Goal Achievement: 03/24/14 Potential to Achieve Goals: Good    Frequency Min 3X/week   Barriers to discharge         Co-evaluation               End of Session Equipment Utilized During Treatment: Gait belt Activity Tolerance: Patient limited by fatigue;Patient limited by pain Patient left: in chair;with call bell/phone within reach           Time: 1006-1030 PT Time Calculation (min): 24 min   Charges:   PT Evaluation $Initial PT Evaluation Tier I: 1 Procedure PT Treatments $Gait Training: 8-22 mins $Therapeutic Activity: 8-22 mins   PT G Codes:          Weston Anna, MPT Pager: [phone]

## 2014-03-10 NOTE — Progress Notes (Signed)
Patient ID: Paige Arnold, female   DOB: 1941-01-23, 73 y.o.   MRN: [id number]  [facility] PROGRESS NOTE  Paige Arnold [id number] DOB: 1940-10-02 DOA: 03/09/2014 PCP: Angelina Sheriff., MD  Brief narrative: 73 y.o. female with chronic renal disease stage III, HTN, presented to [facility] ED with main concern of several days duration of progressively worsening weakness, malaise, poor oral intake. She explains she has bene taking pain medications for long time due to chronic back pain and felt that lately her back pain has been more achy. She visited her PCP and noted to be hypotensive and was advised to go to the ED. Pt denied chest pain or shortness of breath, no abdominal or urinary concerns, reports compliance with medications.   Assessment and Plan:    Principal Problem:   Acute renal failure imposed CKD stage II - Cr slightly down over the past 24 hours - will continue to hold Losartan for now as well as Lasix - I will stop IVF as pt is tolerating regular diet well - will repeat BMP in AM - weight today is 217 lbs and will monitor weight closely - will also monitor strict I's and O's - may be able to resume Lasix in AM Active Problems:   Chronic low back pain - continue home medical regimen    Diabetic neuropathy, painful - continue Neurontin   Unspecified essential hypertension - home regimen now on hold - will place on Hydralazine in the mean time for better BP control   Diabetes mellitus type 2 with complications, neuropathy, nephropathy - continue Insulin SSI while in the hospital - continue Victoza upon discharge as per pt's home regimen - A1C is 6.3   HLD - continue statin    DVT prophylaxis  Heparin SQ while pt is in hospital  Code Status: Full Family Communication: Pt at bedside Disposition Plan: Home when medically stable   IV Access:   Peripheral IV Procedures and diagnostic studies:    No results found.  Medical Consultants:   None Other Consultants:    Physical therapy  Anti-Infectives:   None  Paige Ramsay, MD  [facility] Pager [phone]  If 7PM-7AM, please contact night-coverage [email or web address] Password [facility] 18-Feb-202015, 3:49 PM   LOS: 1 day   HPI/Subjective: No events overnight.   Objective: Filed Vitals:   03/09/14 1636 03/09/14 1659 03/09/14 2121 03/10/14 0510  BP: 135/82 167/56 179/70 159/80  Pulse: 65 60 60 66  Temp:  97.3 F (36.3 C) 97.3 F (36.3 C) 97.4 F (36.3 C)  TempSrc:  Oral Oral Oral  Resp: 15 16 16 16   Height:  5\' 5"  (1.651 m)    Weight:  98.6 kg (217 lb 6 oz)    SpO2: 93% 97% 97% 93%    Intake/Output Summary (Last 24 hours) at 03/10/14 1549 Last data filed at 03/10/14 1020  Gross per 24 hour  Intake   1380 ml  Output   1550 ml  Net   -170 ml    Exam:   General:  Pt is alert, follows commands appropriately, not in acute distress  Cardiovascular: Regular rate and rhythm, SEM 2/6, no rubs, no gallops  Respiratory: Clear to auscultation bilaterally, no wheezing, no crackles, no rhonchi  Abdomen: Soft, non tender, non distended, bowel sounds present, no guarding  Extremities: No edema, pulses DP and PT palpable bilaterally  Data Reviewed: Basic Metabolic Panel:  Recent Labs Lab 03/09/14 1359 03/10/14 0423  NA 141 143  K 4.2 4.0  CL 95* 101  CO2 31 31  GLUCOSE 118* 126*  BUN 54* 45*  CREATININE 2.94* 2.43*  CALCIUM 10.1 9.3   CBC:  Recent Labs Lab 03/09/14 1359  WBC 9.6  NEUTROABS 7.6  HGB 12.3  HCT 37.8  MCV 93.3  PLT 225   Cardiac Enzymes:  Recent Labs Lab 03/09/14 1359  TROPONINI <0.30   CBG:  Recent Labs Lab 03/09/14 1713 03/09/14 2117 03/10/14 0745 03/10/14 1214  GLUCAP 102* 126* 96 105*    Recent Results (from the past 240 hour(s))  URINE CULTURE     Status: None   Collection Time    03/09/14  1:31 PM      Result Value Ref Range Status   Specimen Description URINE, CLEAN CATCH   Final   Special Requests NONE   Final   Culture  Setup Time      Final   Value: 03/09/2014 17:57     Performed at [place]     Final   Value: NO GROWTH     Performed at [facility]   Culture     Final   Value: NO GROWTH     Performed at [facility]   Report Status 2020-01-1514 FINAL   Final     Scheduled Meds: . cholecalciferol  3,000 Units Oral Daily  . divalproex  250 mg Oral Daily  . donepezil  10 mg Oral QHS  . DULoxetine  30 mg Oral Daily  . gabapentin  400 mg Oral QHS  . heparin  5,000 Units Subcutaneous 3 times per day  . insulin aspart  0-5 Units Subcutaneous QHS  . insulin aspart  0-9 Units Subcutaneous TID WC  . insulin aspart  3 Units Subcutaneous TID WC  . levothyroxine  150 mcg Oral QAC breakfast  . morphine  15 mg Oral TID  . simvastatin  10 mg Oral q1800   Continuous Infusions: . sodium chloride 125 mL/hr at 03/10/14 1204

## 2014-03-11 LAB — CBC
HEMATOCRIT: 36.7 % (ref 36.0–46.0)
HEMOGLOBIN: 11.6 g/dL — AB (ref 12.0–15.0)
MCH: 30.4 pg (ref 26.0–34.0)
MCHC: 31.6 g/dL (ref 30.0–36.0)
MCV: 96.1 fL (ref 78.0–100.0)
Platelets: 187 10*3/uL (ref 150–400)
RBC: 3.82 MIL/uL — ABNORMAL LOW (ref 3.87–5.11)
RDW: 13.1 % (ref 11.5–15.5)
WBC: 6.5 10*3/uL (ref 4.0–10.5)

## 2014-03-11 LAB — BASIC METABOLIC PANEL
Anion gap: 11 (ref 5–15)
BUN: 34 mg/dL — AB (ref 6–23)
CO2: 28 mEq/L (ref 19–32)
CREATININE: 2.16 mg/dL — AB (ref 0.50–1.10)
Calcium: 9.5 mg/dL (ref 8.4–10.5)
Chloride: 104 mEq/L (ref 96–112)
GFR calc Af Amer: 25 mL/min — ABNORMAL LOW (ref >90)
GFR, EST NON AFRICAN AMERICAN: 22 mL/min — AB (ref >90)
GLUCOSE: 155 mg/dL — AB (ref 70–99)
POTASSIUM: 4.4 meq/L (ref 3.7–5.3)
Sodium: 143 mEq/L (ref 137–147)

## 2014-03-11 LAB — GLUCOSE, CAPILLARY
GLUCOSE-CAPILLARY: 112 mg/dL — AB (ref 70–99)
Glucose-Capillary: 128 mg/dL — ABNORMAL HIGH (ref 70–99)

## 2014-03-11 MED ORDER — FUROSEMIDE 40 MG PO TABS: 80.0000 mg | ORAL_TABLET | Freq: Two times a day (BID) | ORAL | Status: DC

## 2014-03-11 MED ORDER — FUROSEMIDE 40 MG PO TABS: 40.0000 mg | ORAL_TABLET | Freq: Two times a day (BID) | ORAL | Status: DC

## 2014-03-11 MED ORDER — CLONIDINE HCL 0.2 MG PO TABS
0.2000 mg | ORAL_TABLET | Freq: Two times a day (BID) | ORAL | Status: DC
  Administered 2014-03-11: 0.2 mg via ORAL
  Filled 2014-03-11 (×2): qty 1

## 2014-03-11 MED ORDER — FUROSEMIDE 40 MG PO TABS
40.0000 mg | ORAL_TABLET | Freq: Two times a day (BID) | ORAL | Status: DC
  Administered 2014-03-11: 40 mg via ORAL
  Filled 2014-03-11 (×3): qty 1

## 2014-03-11 NOTE — Progress Notes (Signed)
Physical Therapy Treatment Patient Details Name: RAYMONDA CHELIUS MRN: [id number] DOB: [birth year] Today's Date: 03/11/2014    History of Present Illness 73 yo female admitted with acute kidney injury, L LE/knee pain,weakness, dizziness.     PT Comments    Improved performance on today. Pt denied dizziness during session. Tolerated activity well. Plan is for d/c home later today per pt. Pt did not have any further questions/concerns.   Follow Up Recommendations  No PT follow up;Supervision for mobility/OOB     Equipment Recommendations  None recommended by PT    Recommendations for Other Services       Precautions / Restrictions Precautions Precautions: Fall Restrictions Weight Bearing Restrictions: No    Mobility  Bed Mobility Overal bed mobility: Modified Independent                Transfers Overall transfer level: Modified independent               General transfer comment: Pt denied dizziness  Ambulation/Gait Ambulation/Gait assistance: Supervision Ambulation Distance (Feet): 175 Feet Assistive device: Rolling walker (2 wheeled) Gait Pattern/deviations: Step-through pattern;Antalgic     General Gait Details: slow gait speed. Pt denied dizziness but did endorse L knee pain with ambulation-chronic in nature. tolerated activity well.    Stairs            Wheelchair Mobility    Modified Rankin (Stroke Patients Only)       Balance                                    Cognition Arousal/Alertness: Awake/alert Behavior During Therapy: WFL for tasks assessed/performed Overall Cognitive Status: Within Functional Limits for tasks assessed                      Exercises      General Comments        Pertinent Vitals/Pain Pain Assessment: 0-10 Pain Location: L knee-chronic-RSD per pt Pain Descriptors / Indicators: Shooting Pain Intervention(s): Premedicated before session;Limited activity within patient's  tolerance;Repositioned    Home Living                      Prior Function            PT Goals (current goals can now be found in the care plan section) Progress towards PT goals: Progressing toward goals    Frequency  Min 3X/week    PT Plan Current plan remains appropriate    Co-evaluation             End of Session Equipment Utilized During Treatment: Gait belt Activity Tolerance: Patient tolerated treatment well Patient left: in bed;with call bell/phone within reach     Time: 1118-1131 PT Time Calculation (min): 13 min  Charges:  $Gait Training: 8-22 mins                    G Codes:      Weston Anna, MPT Pager: [phone]

## 2014-03-11 NOTE — Discharge Summary (Signed)
Physician Discharge Summary  Paige Arnold [id number] DOB: 10/23/1940 DOA: 03/09/2014  PCP: Angelina Sheriff., MD  Admit date: 03/09/2014 Discharge date: 03/11/2014  Recommendations for Outpatient Follow-up:  1. Pt will need to follow up with PCP within 3-4 weeks 2. PT advised to call nephrologist's office to schedule an appointment as soon as possible, number provided  3. Please obtain BMP to evaluate electrolytes and kidney function 4. Please also check CBC to evaluate Hg and Hct levels 5. Please note that due to soft BP on admission, SBP in 90's and with worsening renal failure, pt advised ot stop taking Valsartan until discussed regimen with her nephrologist 6. Pt also advised to take Lasix 40 mg tablet BID, I discussed with her list of medications and the changes that were made in detail 7. I spoke with family again this evening at home to go over the medication list to ensure that pt is aware of the changes we made 8. Weill forward this note to her PCP Dr. Lin Landsman and to her nephrologist Dr. Lorrene Reid   Discharge Diagnoses: Acute on chronic renal failure  Principal Problem:   Acute kidney injury Active Problems:   Chronic low back pain   Diabetic neuropathy, painful   Unspecified essential hypertension   Diabetic nephropathy   Diabetes mellitus type 2 with complications, uncontrolled   AKI (acute kidney injury)   CKD (chronic kidney disease), stage III    Discharge Condition: Stable  Diet recommendation: Heart healthy diet discussed in details   History of present illness:  Brief narrative:  73 y.o. female with chronic renal disease stage III, HTN, presented to [facility] ED with main concern of several days duration of progressively worsening weakness, malaise, poor oral intake. She explains she has bene taking pain medications for long time due to chronic back pain and felt that lately her back pain has been more achy. She visited her PCP and noted to be hypotensive and was advised  to go to the ED. Pt denied chest pain or shortness of breath, no abdominal or urinary concerns, reports compliance with medications.   Assessment and Plan:   Principal Problem:  Acute renal failure imposed CKD stage II  - Cr continue trending down since admission: 2.94 --> 2.43 --> 2.16 - will continue to hold Losartan upon discharge - we also cut down the dose of Lasix to 40 mg BID PO until pt's sees her nephrologist  - weight today is 217 lbs and will monitor weight closely  Active Problems:  Chronic low back pain  - continue home medical regimen  Diabetic neuropathy, painful  - continue Neurontin  Unspecified essential hypertension  - home regimen now on hold  - will place on Hydralazine in the mean time for better BP control  Diabetes mellitus type 2 with complications, neuropathy, nephropathy  - continued Insulin SSI while in the hospital  - continue Victoza upon discharge as per pt's home regimen  - A1C is 6.3  HLD - continue statin   DVT prophylaxis  Heparin SQ while pt is in hospital  Code Status: Full  Family Communication: Pt at bedside  Disposition Plan: Home   IV Access:   Peripheral IV Procedures and diagnostic studies:    No results found.   Medical Consultants:   None  Other Consultants:   Physical therapy   Anti-Infectives:   None  Discharge Exam: Filed Vitals:   03/11/14 0648  BP: 175/68  Pulse: 59  Temp: 97.6 F (36.4 C)  Resp: 16   Filed Vitals:   03/10/14 1455 03/10/14 2138 03/10/14 2145 03/11/14 0648  BP: 176/67 165/59 150/66 175/68  Pulse: 70 64  59  Temp: 97.6 F (36.4 C) 97.8 F (36.6 C)  97.6 F (36.4 C)  TempSrc: Oral Oral  Oral  Resp: 16 16  16   Height:      Weight:      SpO2:  94%  98%    General: Pt is alert, follows commands appropriately, not in acute distress Cardiovascular: Regular rate and rhythm, no rubs, no gallops Respiratory: Clear to auscultation bilaterally, no wheezing, no crackles, no rhonchi Abdominal:  Soft, non tender, non distended, bowel sounds +, no guarding Extremities: no cyanosis, pulses palpable bilaterally DP and PT Neuro: Grossly nonfocal  Discharge Instructions  Discharge Instructions   Diet - low sodium heart healthy    Complete by:  As directed      Increase activity slowly    Complete by:  As directed             Medication List    STOP taking these medications       valsartan 160 MG tablet  Commonly known as:  DIOVAN      TAKE these medications       BLACK COHOSH PO  Take 40 mg by mouth daily.     cloNIDine 0.2 MG tablet  Commonly known as:  CATAPRES  Take 0.2 mg by mouth 2 (two) times daily.     dicyclomine 20 MG tablet  Commonly known as:  BENTYL  Take 20 mg by mouth 4 (four) times daily -  before meals and at bedtime. As needed for cramping.     divalproex 250 MG 24 hr tablet  Commonly known as:  DEPAKOTE ER  1 every morning  for 1 week then 2 every morning.     donepezil 10 MG tablet  Commonly known as:  ARICEPT  Take 10 mg by mouth at bedtime. 1 q hs     DULoxetine 30 MG capsule  Commonly known as:  CYMBALTA  Take 30 mg by mouth daily.     furosemide 40 MG tablet  Commonly known as:  LASIX  Take 1 tablet (40 mg total) by mouth 2 (two) times daily.     gabapentin 400 MG capsule  Commonly known as:  NEURONTIN  Take 400 mg by mouth at bedtime.     levothyroxine 150 MCG tablet  Commonly known as:  SYNTHROID, LEVOTHROID  Take 150 mcg by mouth daily before breakfast.     morphine 15 MG tablet  Commonly known as:  MSIR  Take 1 tablet (15 mg total) by mouth 3 (three) times daily.     multivitamin with minerals Tabs tablet  Take 1 tablet by mouth daily.     phenazopyridine 100 MG tablet  Commonly known as:  PYRIDIUM  Take 100 mg by mouth as needed for pain.     pravastatin 40 MG tablet  Commonly known as:  PRAVACHOL  Take 40 mg by mouth daily.     sitaGLIPtin 100 MG tablet  Commonly known as:  JANUVIA  Take 50 mg by mouth  daily.     VICTOZA 18 MG/3ML Sopn  Generic drug:  Liraglutide  Inject 1.8 mg into the skin daily.     VITAMIN B-12 IJ  Inject as directed every 30 (thirty) days.            Follow-up Information   Schedule an  appointment as soon as possible for a visit with [facility] Angelique Blonder., MD.   Specialty:  Family Medicine   Contact information:   [place] [address] [phone]       Follow up with Faye Ramsay, MD. (As needed, call my cell phone [phone])    Specialty:  Internal Medicine   Contact information:   [address] [place] [address] [phone]       Follow up with Lucrezia Starch, MD On 03/11/2014.   Specialty:  Nephrology   Contact information:   [place] [address] [phone]        The results of significant diagnostics from this hospitalization (including imaging, microbiology, ancillary and laboratory) are listed below for reference.     Microbiology: Recent Results (from the past 240 hour(s))  URINE CULTURE     Status: None   Collection Time    03/09/14  1:31 PM      Result Value Ref Range Status   Specimen Description URINE, CLEAN CATCH   Final   Special Requests NONE   Final   Culture  Setup Time     Final   Value: 03/09/2014 17:57     Performed at [place]     Final   Value: NO GROWTH     Performed at [facility]   Culture     Final   Value: NO GROWTH     Performed at [facility]   Report Status 04-Oct-202015 FINAL   Final     Labs: Basic Metabolic Panel:  Recent Labs Lab 03/09/14 1359 03/10/14 0423 03/11/14 0003  NA 141 143 143  K 4.2 4.0 4.4  CL 95* 101 104  CO2 31 31 28   GLUCOSE 118* 126* 155*  BUN 54* 45* 34*  CREATININE 2.94* 2.43* 2.16*  CALCIUM 10.1 9.3 9.5   Liver Function Tests: No results found for this basename: AST, ALT, ALKPHOS, BILITOT, PROT, ALBUMIN,  in the last 168 hours No results found for this  basename: LIPASE, AMYLASE,  in the last 168 hours No results found for this basename: AMMONIA,  in the last 168 hours CBC:  Recent Labs Lab 03/09/14 1359 03/11/14 0003  WBC 9.6 6.5  NEUTROABS 7.6  --   HGB 12.3 11.6*  HCT 37.8 36.7  MCV 93.3 96.1  PLT 225 187   Cardiac Enzymes:  Recent Labs Lab 03/09/14 1359  TROPONINI <0.30   BNP: BNP (last 3 results) No results found for this basename: PROBNP,  in the last 8760 hours CBG:  Recent Labs Lab 03/10/14 0745 03/10/14 1214 03/10/14 1715 03/10/14 2136 03/11/14 0756  GLUCAP 96 105* 145* 126* 112*     SIGNED: Time coordinating discharge: Over 30 minutes  Faye Ramsay, MD  [facility] 03/11/2014, 10:44 AM Pager [phone]  If 7PM-7AM, please contact night-coverage [email or web address] Password [email or web address]

## 2014-03-11 NOTE — Progress Notes (Signed)
Patient discharged to home with family via wheelchair, discharge instructions reviewed with patient who verbalized understanding. No new RX's given to patient.

## 2014-03-11 NOTE — Discharge Instructions (Signed)
Please note that we have stopped your medicine Valsartan due to worsening kidney injury, creatinine 2.94 on admission, this number continued trending down, creatinine 2.16 on discharge  Please also note that Lasix dose was decreased from 80 mg tablet twice a day to 40 mg tablet twice a day due to low blood pressure  We have continued clonidine for blood pressure control with no changes in the dosing  We have notified your primary care doctor of your admission, we have also notified your nephrologist for need of an appointment to followup on kidney function tests and to adjust the dose of Lasix as indicated  Please also discussed with nephrologist if restarting valsartan is safe   Acute Kidney Injury Acute kidney injury is a disease in which there is sudden (acute) damage to the kidneys. The kidneys are 2 organs that lie on either side of the spine between the middle of the back and the front of the abdomen. The kidneys:  Remove wastes and extra water from the blood.   Produce important hormones. These help keep bones strong, regulate blood pressure, and help create red blood cells.   Balance the fluids and chemicals in the blood and tissues. A small amount of kidney damage may not cause problems, but a large amount of damage may make it difficult or impossible for the kidneys to work the way they should. Acute kidney injury may develop into long-lasting (chronic) kidney disease. It may also develop into a life-threatening disease called end-stage kidney disease. Acute kidney injury can get worse very quickly, so it should be treated right away. Early treatment may prevent other kidney diseases from developing.  CAUSES   A problem with blood flow to the kidneys. This may be caused by:   Blood loss.   Heart disease.   Severe burns.   Liver disease.  Direct damage to the kidneys. This may be caused by:  Some medicines.   A kidney infection.   Poisoning or consuming  toxic substances.   A surgical wound.   A blow to the kidney area.   A problem with urine flow. This may be caused by:   Cancer.   Kidney stones.   An enlarged prostate. SYMPTOMS   Swelling (edema) of the legs, ankles, or feet.   Tiredness (lethargy).   Nausea or vomiting.   Confusion.   Problems with urination, such as:   Painful or burning feeling during urination.   Decreased urine production.   Frequent accidents in children who are potty trained.   Bloody urine.   Muscle twitches and cramps.   Shortness of breath.   Seizures.   Chest pain or pressure. Sometimes, no symptoms are present. DIAGNOSIS Acute kidney injury may be detected and diagnosed by tests, including blood, urine, imaging, or kidney biopsy tests.  TREATMENT Treatment of acute kidney injury varies depending on the cause and severity of the kidney damage. In mild cases, no treatment may be needed. The kidneys may heal on their own. If acute kidney injury is more severe, your caregiver will treat the cause of the kidney damage, help the kidneys heal, and prevent complications from occurring. Severe cases may require a procedure to remove toxic wastes from the body (dialysis) or surgery to repair kidney damage. Surgery may involve:   Repair of a torn kidney.   Removal of an obstruction. Most of the time, you will need to stay overnight at the hospital.  HOME CARE INSTRUCTIONS:  Follow your prescribed diet.  Only take over-the-counter or prescription medicines as directed by your caregiver.  Do not take any new medicines (prescription, over-the-counter, or nutritional supplements) unless approved by your caregiver. Many medicines can worsen your kidney damage or need to have the dose adjusted.   Keep all follow-up appointments as directed by your caregiver.  Observe your condition to make sure you are healing as expected. SEEK IMMEDIATE MEDICAL CARE IF:  You are  feeling ill or have severe pain in the back or side.   Your symptoms return or you have new symptoms.  You have any symptoms of end-stage kidney disease. These include:   Persistent itchiness.   Loss of appetite.   Headaches.   Abnormally dark or light skin.  Numbness in the hands or feet.   Easy bruising.   Frequent hiccups.   Menstruation stops.   You have a fever.  You have increased urine production.  You have pain or bleeding when urinating. MAKE SURE YOU:   Understand these instructions.  Will watch your condition.  Will get help right away if you are not doing well or get worse Document Released: 01/15/2011 Document Revised: 10/27/2012 Document Reviewed: 02/29/2012 [facility] Patient Information 2015 [place], [place]. This information is not intended to replace advice given to you by your health care provider. Make sure you discuss any questions you have with your health care provider.

## 2014-03-15 ENCOUNTER — Telehealth: Payer: Self-pay

## 2014-03-15 NOTE — Telephone Encounter (Signed)
Ok for [employer] patch #[id number], one patch to affected area on 12h off 12h

## 2014-03-15 NOTE — Telephone Encounter (Signed)
Patient is requesting a new RX for Lidoderm patches for knee pain. Please advise.

## 2014-03-17 MED ORDER — LIDOCAINE 5 % EX PTCH: 1.0000 | MEDICATED_PATCH | CUTANEOUS | Status: DC

## 2014-03-17 NOTE — Telephone Encounter (Signed)
Notified Paige Arnold.  She has not had shingles and I made her aware that some insurance will not cover for other indications except shingles but she says it is workman's comp and they have been paying for them.  Refill sent to pharmacy.

## 2014-04-05 ENCOUNTER — Encounter: Payer: Worker's Compensation | Attending: Registered Nurse

## 2014-04-05 ENCOUNTER — Ambulatory Visit: Payer: Worker's Compensation | Admitting: Physical Medicine & Rehabilitation

## 2014-04-05 DIAGNOSIS — M545 Low back pain, unspecified: Secondary | ICD-10-CM | POA: Insufficient documentation

## 2014-04-05 DIAGNOSIS — M48061 Spinal stenosis, lumbar region without neurogenic claudication: Secondary | ICD-10-CM | POA: Insufficient documentation

## 2014-04-05 DIAGNOSIS — G8929 Other chronic pain: Secondary | ICD-10-CM | POA: Insufficient documentation

## 2014-04-05 DIAGNOSIS — Z79899 Other long term (current) drug therapy: Secondary | ICD-10-CM | POA: Insufficient documentation

## 2014-04-05 DIAGNOSIS — Z5181 Encounter for therapeutic drug level monitoring: Secondary | ICD-10-CM | POA: Insufficient documentation

## 2014-04-05 NOTE — Progress Notes (Signed)
03/10/14 1251  PT Time Calculation  PT Start Time 1006  PT Stop Time 1030  PT Time Calculation (min) 24 min  PT G-Codes **NOT FOR INPATIENT CLASS**  Functional Assessment Tool Used (clinical judgement)  Functional Limitation Mobility: Walking and moving around  Mobility: Walking and Moving Around Current Status [id number] CJ  Mobility: Walking and Moving Around Goal Status [id number]) CI  PT General Charges  $$ ACUTE PT VISIT 1 Procedure  PT Evaluation  $Initial PT Evaluation Tier I 1 Procedure  PT Treatments  $Gait Training 8-22 mins  $Therapeutic Activity 8-22 mins   Weston Anna, MPT [phone]

## 2014-04-09 ENCOUNTER — Encounter: Payer: Worker's Compensation | Attending: Physical Medicine & Rehabilitation

## 2014-04-09 ENCOUNTER — Ambulatory Visit (HOSPITAL_BASED_OUTPATIENT_CLINIC_OR_DEPARTMENT_OTHER): Payer: Worker's Compensation | Admitting: Physical Medicine & Rehabilitation

## 2014-04-09 ENCOUNTER — Encounter: Payer: Self-pay | Admitting: Physical Medicine & Rehabilitation

## 2014-04-09 VITALS — BP 137/82 | HR 71 | Resp 14 | Wt 213.0 lb

## 2014-04-09 DIAGNOSIS — M545 Low back pain, unspecified: Secondary | ICD-10-CM | POA: Insufficient documentation

## 2014-04-09 DIAGNOSIS — G8929 Other chronic pain: Secondary | ICD-10-CM | POA: Insufficient documentation

## 2014-04-09 DIAGNOSIS — Z5181 Encounter for therapeutic drug level monitoring: Secondary | ICD-10-CM | POA: Insufficient documentation

## 2014-04-09 DIAGNOSIS — Z79899 Other long term (current) drug therapy: Secondary | ICD-10-CM | POA: Insufficient documentation

## 2014-04-09 DIAGNOSIS — M5137 Other intervertebral disc degeneration, lumbosacral region: Secondary | ICD-10-CM | POA: Diagnosis not present

## 2014-04-09 DIAGNOSIS — M48061 Spinal stenosis, lumbar region without neurogenic claudication: Secondary | ICD-10-CM

## 2014-04-09 DIAGNOSIS — M51379 Other intervertebral disc degeneration, lumbosacral region without mention of lumbar back pain or lower extremity pain: Secondary | ICD-10-CM | POA: Insufficient documentation

## 2014-04-09 MED ORDER — MORPHINE SULFATE 15 MG PO TABS: 15.0000 mg | ORAL_TABLET | Freq: Three times a day (TID) | ORAL | Status: DC

## 2014-04-09 NOTE — Progress Notes (Signed)
Subjective:    Patient ID: Paige Arnold, female    DOB: [birth year], 73 y.o.   MRN: [id number]  HPI  Patient has been hospitalized for renal failure on 2 occasions since her last visit at this clinic. Her first at [facility] and the second one at [facility]. She then went to a skilled nursing facility and is now back home. Her daughter has moved in with her to help care for her. Daughter is asking whether the patient has become immune to morphine. We discussed the concept of tolerance as well as cross tolerance. We also discussed the concept of opioid rotation In addition patient has complained of increasing left knee pain. She states she fell at the skilled nursing facility because her left knee buckled. Patient has a history of left TK R. Right knee pain is starting to increase but this is still her better side Pain Inventory Average Pain 5 Pain Right Now 7 My pain is sharp, burning, dull, stabbing, tingling and aching  In the last 24 hours, has pain interfered with the following? General activity 10 Relation with others 5 Enjoyment of life 9 What TIME of day is your pain at its worst? all  Sleep (in general) Poor  Pain is worse with: walking, bending, standing and some activites Pain improves with: rest and heat/ice Relief from Meds: 6  Mobility walk with assistance use a cane use a walker how many minutes can you walk? 1 ability to climb steps?  no do you drive?  no  Function disabled: date disabled .  Neuro/Psych bladder control problems weakness numbness tremor tingling trouble walking dizziness confusion depression anxiety loss of taste or smell  Prior Studies Any changes since last visit?  no  Physicians involved in your care Any changes since last visit?  no   Family History  Problem Relation Age of Onset  . Kidney disease Mother   . Heart disease Father   . Diabetes Brother   . Alcohol abuse Brother   . Depression Brother   .  Sarcoidosis Brother   . ALS Brother   . Heart disease Brother    History   Social History  . Marital Status: Married    Spouse Name: N/A    Number of Children: N/A  . Years of Education: N/A   Social History Main Topics  . Smoking status: Never Smoker   . Smokeless tobacco: Never Used  . Alcohol Use: No  . Drug Use: No  . Sexual Activity: Yes   Other Topics Concern  . None   Social History Narrative  . None   Past Surgical History  Procedure Laterality Date  . Tubal ligation    . Temporomandibular joint surgery    . Partial knee arthroplasty    . Cholecystectomy    . Intraocular lens insertion Right 10-21-13   Past Medical History  Diagnosis Date  . Thyroid disorder   . Kidney disease   . Arthritis   . Hypertension   . Diabetes mellitus    BP 137/82  Pulse 71  Resp 14  Wt 213 lb (96.616 kg)  SpO2 94%  Opioid Risk Score:   Fall Risk Score:     Review of Systems  Constitutional: Positive for diaphoresis and appetite change.  Cardiovascular: Positive for leg swelling.  Endocrine:       High  Genitourinary: Positive for dysuria and difficulty urinating.  Musculoskeletal: Positive for gait problem.  Neurological: Positive for dizziness, weakness and numbness.  Tingling  Psychiatric/Behavioral: Positive for confusion and dysphoric mood. The patient is nervous/anxious.   All other systems reviewed and are negative.      Objective:   Physical Exam  Nursing note and vitals reviewed. Constitutional: She is oriented to person, place, and time. She appears well-developed.  Obese  HENT:  Head: Normocephalic and atraumatic.  Eyes: Conjunctivae and EOM are normal. Pupils are equal, round, and reactive to light.  Neurological: She is alert and oriented to person, place, and time.  Psychiatric: She has a normal mood and affect.    Left knee without effusion no evidence of erythema there is tenderness palpation all around the patellar as well as joint line  areas. Motor strength is 4/5 and hip flexors knee extensors ankle dorsiflexor.        Assessment & Plan:  1. Spinal stenosis chronic low back pain radiating into the legs  She had excessive sedation on the previous medication regimen. Appears to be much brighter today however complaining of increased pain. We discussed that there must be a balancing act between pain relief and adverse side effects     Continue morphine sulfate immediate release 15 mg 3 times a day  Continue gabapentin 400 mg each bedtime, this was switched during her hospitalization to every morning discussed this with patient and her daughter that is better to take in the evening  2. 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 order brace. If this is progressive many orthopedic reevaluation answered daughters questions  Over half of the 25 min visit was spent counseling and coordinating care. RTC one month

## 2014-04-09 NOTE — Patient Instructions (Addendum)
Senokot 2-3 tablets twice a day for constipation   Call the brace maker to schedule a time to get fit

## 2014-04-27 ENCOUNTER — Telehealth: Payer: Self-pay | Admitting: *Deleted

## 2014-04-27 NOTE — Telephone Encounter (Signed)
Was in the office on 9/28 and recvd an RX for a brace.  Please call to discuss

## 2014-04-30 ENCOUNTER — Encounter: Payer: Self-pay | Admitting: Physical Medicine & Rehabilitation

## 2014-04-30 DIAGNOSIS — T84023A Instability of internal left knee prosthesis, initial encounter: Secondary | ICD-10-CM | POA: Insufficient documentation

## 2014-04-30 NOTE — Telephone Encounter (Signed)
Paige Arnold says that the brace was not the correct order and that they would send the request for what was needed.  Approval needs to be obtained from her workman's comp

## 2014-05-12 ENCOUNTER — Encounter: Payer: Worker's Compensation | Attending: Registered Nurse | Admitting: Registered Nurse

## 2014-05-12 ENCOUNTER — Encounter: Payer: Self-pay | Admitting: Registered Nurse

## 2014-05-12 VITALS — BP 110/52 | HR 62 | Resp 14 | Ht 65.0 in | Wt 214.0 lb

## 2014-05-12 DIAGNOSIS — M62838 Other muscle spasm: Secondary | ICD-10-CM | POA: Diagnosis not present

## 2014-05-12 DIAGNOSIS — M4806 Spinal stenosis, lumbar region: Secondary | ICD-10-CM | POA: Diagnosis not present

## 2014-05-12 DIAGNOSIS — G8929 Other chronic pain: Secondary | ICD-10-CM | POA: Insufficient documentation

## 2014-05-12 DIAGNOSIS — Z5181 Encounter for therapeutic drug level monitoring: Secondary | ICD-10-CM

## 2014-05-12 DIAGNOSIS — M5137 Other intervertebral disc degeneration, lumbosacral region: Secondary | ICD-10-CM

## 2014-05-12 DIAGNOSIS — M25562 Pain in left knee: Secondary | ICD-10-CM | POA: Diagnosis not present

## 2014-05-12 DIAGNOSIS — E1140 Type 2 diabetes mellitus with diabetic neuropathy, unspecified: Secondary | ICD-10-CM | POA: Insufficient documentation

## 2014-05-12 DIAGNOSIS — E079 Disorder of thyroid, unspecified: Secondary | ICD-10-CM | POA: Diagnosis not present

## 2014-05-12 DIAGNOSIS — M545 Low back pain: Secondary | ICD-10-CM

## 2014-05-12 DIAGNOSIS — Z79899 Other long term (current) drug therapy: Secondary | ICD-10-CM

## 2014-05-12 DIAGNOSIS — I10 Essential (primary) hypertension: Secondary | ICD-10-CM | POA: Diagnosis not present

## 2014-05-12 DIAGNOSIS — M48061 Spinal stenosis, lumbar region without neurogenic claudication: Secondary | ICD-10-CM

## 2014-05-12 MED ORDER — MORPHINE SULFATE 15 MG PO TABS: 15.0000 mg | ORAL_TABLET | Freq: Three times a day (TID) | ORAL | Status: DC

## 2014-05-12 NOTE — Progress Notes (Signed)
Subjective:    Patient ID: Paige Arnold, female    DOB: [birth year], 73 y.o.   MRN: [id number]  HPI: Mrs. DAMYIAH HACKEL is a 73 year old female who returns for follow up for chronic pain and medication refill. She says her pain is located in her lower back, left knee, also been having headaches. She says her headaches are related to family stress regarding her daughter.  Pain Inventory Average Pain 4 Pain Right Now 5 My pain is constant, sharp, burning, dull, stabbing, tingling and aching  In the last 24 hours, has pain interfered with the following? General activity 0 Relation with others 1 Enjoyment of life 1 What TIME of day is your pain at its worst? morning, daytime, evening, night Sleep (in general) Poor  Pain is worse with: walking, bending, standing and some activites Pain improves with: rest and heat/ice Relief from Meds: 4  Mobility use a cane ability to climb steps?  yes do you drive?  no  Function disabled: date disabled no date given retired I need assistance with the following:  meal prep, household duties and shopping  Neuro/Psych weakness tingling trouble walking dizziness depression anxiety  Prior Studies Any changes since last visit?  no  Physicians involved in your care Any changes since last visit?  no   Family History  Problem Relation Age of Onset  . Kidney disease Mother   . Heart disease Father   . Diabetes Brother   . Alcohol abuse Brother   . Depression Brother   . Sarcoidosis Brother   . ALS Brother   . Heart disease Brother    History   Social History  . Marital Status: Married    Spouse Name: N/A    Number of Children: N/A  . Years of Education: N/A   Social History Main Topics  . Smoking status: Never Smoker   . Smokeless tobacco: Never Used  . Alcohol Use: No  . Drug Use: No  . Sexual Activity: Yes   Other Topics Concern  . None   Social History Narrative  . None   Past Surgical History  Procedure Laterality  Date  . Tubal ligation    . Temporomandibular joint surgery    . Partial knee arthroplasty    . Cholecystectomy    . Intraocular lens insertion Right 10-21-13   Past Medical History  Diagnosis Date  . Thyroid disorder   . Kidney disease   . Arthritis   . Hypertension   . Diabetes mellitus    There were no vitals taken for this visit.  Opioid Risk Score:   Fall Risk Score: High Fall Risk (>13 points)   Review of Systems     Objective:   Physical Exam  Nursing note and vitals reviewed. Constitutional: She is oriented to person, place, and time. She appears well-developed and well-nourished.  HENT:  Head: Normocephalic and atraumatic.  Neck: Normal range of motion. Neck supple.  Cardiovascular: Normal rate and regular rhythm.   Pulmonary/Chest: Effort normal and breath sounds normal.  Musculoskeletal:  Normal Muscle Bulk and Muscle Testing Reveals: Upper Extremities: Full ROM and Muscle strength 5/5 Lumbar Paraspinal Tenderness: L-3- L-5 Lower Extremities: Full ROM and Muscle strength 5/5 Left Leg Flexion Produces pain into Patella. No swelling Noted, tenderness with palpation Arises from chair with slight difficulty using rolling walker for support. Wide Based gait  Neurological: She is alert and oriented to person, place, and time.  Skin: Skin is warm and dry.  Psychiatric: She has a normal mood and affect.          Assessment & Plan:  1. Spinal stenosis chronic low back pain radiating into the legs: Continue MSIR 15 mg take one tablet by mouth three times daily #90 .  2. Diabetic Neuropathy: Continue Gabapentin.  3. Muscle Spasms: Continue Skelaxin.  4. Left Knee Pain: Continue Current Medication and exercise Regimen.  20 minutes of face to face patient care time was spent during this visit. All questions were encouraged and answered.  F/U in 1 month

## 2014-05-21 ENCOUNTER — Telehealth: Payer: Self-pay | Admitting: *Deleted

## 2014-05-21 DIAGNOSIS — T84023A Instability of internal left knee prosthesis, initial encounter: Secondary | ICD-10-CM

## 2014-05-21 NOTE — Telephone Encounter (Signed)
Patient called and left message regarding her RX for a knee brace.  It was supposed to go through W/C.  Need to proceed through proper protocols.

## 2014-05-21 NOTE — Telephone Encounter (Signed)
Dr. Raliegh Ip Could we get an RX/ order for the knee brace? Then I will have Corene Cornea run it through [facility] and see the benefits available for the brace.

## 2014-05-21 NOTE — Telephone Encounter (Signed)
Left knee orthosis for instability post TKR

## 2014-05-27 NOTE — Telephone Encounter (Signed)
Order has been placed for knee brace.

## 2014-06-04 ENCOUNTER — Ambulatory Visit (HOSPITAL_COMMUNITY): Payer: Self-pay | Admitting: Psychiatry

## 2014-06-14 ENCOUNTER — Encounter: Payer: Worker's Compensation | Attending: Registered Nurse | Admitting: Registered Nurse

## 2014-06-14 ENCOUNTER — Encounter: Payer: Self-pay | Admitting: Registered Nurse

## 2014-06-14 VITALS — BP 153/75 | HR 69 | Resp 14 | Ht 65.5 in | Wt 214.0 lb

## 2014-06-14 DIAGNOSIS — Z5181 Encounter for therapeutic drug level monitoring: Secondary | ICD-10-CM

## 2014-06-14 DIAGNOSIS — M79641 Pain in right hand: Secondary | ICD-10-CM | POA: Insufficient documentation

## 2014-06-14 DIAGNOSIS — M5137 Other intervertebral disc degeneration, lumbosacral region: Secondary | ICD-10-CM

## 2014-06-14 DIAGNOSIS — Z79899 Other long term (current) drug therapy: Secondary | ICD-10-CM

## 2014-06-14 DIAGNOSIS — Z760 Encounter for issue of repeat prescription: Secondary | ICD-10-CM | POA: Insufficient documentation

## 2014-06-14 DIAGNOSIS — E079 Disorder of thyroid, unspecified: Secondary | ICD-10-CM | POA: Diagnosis not present

## 2014-06-14 DIAGNOSIS — M4800 Spinal stenosis, site unspecified: Secondary | ICD-10-CM | POA: Diagnosis not present

## 2014-06-14 DIAGNOSIS — M4806 Spinal stenosis, lumbar region: Secondary | ICD-10-CM

## 2014-06-14 DIAGNOSIS — M25562 Pain in left knee: Secondary | ICD-10-CM | POA: Diagnosis not present

## 2014-06-14 DIAGNOSIS — M79642 Pain in left hand: Secondary | ICD-10-CM | POA: Diagnosis not present

## 2014-06-14 DIAGNOSIS — N289 Disorder of kidney and ureter, unspecified: Secondary | ICD-10-CM | POA: Insufficient documentation

## 2014-06-14 DIAGNOSIS — I10 Essential (primary) hypertension: Secondary | ICD-10-CM | POA: Diagnosis not present

## 2014-06-14 DIAGNOSIS — M79672 Pain in left foot: Secondary | ICD-10-CM | POA: Diagnosis not present

## 2014-06-14 DIAGNOSIS — M545 Low back pain, unspecified: Secondary | ICD-10-CM

## 2014-06-14 DIAGNOSIS — E1140 Type 2 diabetes mellitus with diabetic neuropathy, unspecified: Secondary | ICD-10-CM | POA: Diagnosis not present

## 2014-06-14 DIAGNOSIS — M79671 Pain in right foot: Secondary | ICD-10-CM | POA: Insufficient documentation

## 2014-06-14 DIAGNOSIS — G8929 Other chronic pain: Secondary | ICD-10-CM | POA: Insufficient documentation

## 2014-06-14 DIAGNOSIS — M1990 Unspecified osteoarthritis, unspecified site: Secondary | ICD-10-CM | POA: Diagnosis not present

## 2014-06-14 DIAGNOSIS — M48061 Spinal stenosis, lumbar region without neurogenic claudication: Secondary | ICD-10-CM

## 2014-06-14 MED ORDER — MORPHINE SULFATE 15 MG PO TABS: 15.0000 mg | ORAL_TABLET | Freq: Three times a day (TID) | ORAL | Status: DC

## 2014-06-14 NOTE — Progress Notes (Signed)
Subjective:    Patient ID: Paige Arnold, female    DOB: [birth year], 73 y.o.   MRN: [id number]  HPI: Mrs. BRYDEN PEGUES is a 73 year old female who returns for follow up for chronic pain and medication refill. She says her pain is located in her hands, lower back, left knee and bilateral feet. She rates her pain 5. Her current exercise regime is working with physical therapist twice a week in her home. She asked about her knee brace approval from workman's compensation. Office staff will give her a call. She was tearful today, all questions answered and emotional support given.   Pain Inventory Average Pain 5 Pain Right Now 5 My pain is constant, sharp, burning, dull, stabbing, tingling and aching  In the last 24 hours, has pain interfered with the following? General activity 2 Relation with others 1 Enjoyment of life 1 What TIME of day is your pain at its worst? morning, daytime, evening, night Sleep (in general) Poor  Pain is worse with: walking, bending, sitting, standing and some activites Pain improves with: therapy/exercise and medication Relief from Meds: 2  Mobility use a cane how many minutes can you walk? 10 ability to climb steps?  yes do you drive?  no needs help with transfers  Function disabled: date disabled . retired I need assistance with the following:  meal prep, household duties and shopping  Neuro/Psych weakness trouble walking dizziness anxiety  Prior Studies no selection  Physicians involved in your care no selection   Family History  Problem Relation Age of Onset  . Kidney disease Mother   . Heart disease Father   . Diabetes Brother   . Alcohol abuse Brother   . Depression Brother   . Sarcoidosis Brother   . ALS Brother   . Heart disease Brother    History   Social History  . Marital Status: Married    Spouse Name: N/A    Number of Children: N/A  . Years of Education: N/A   Social History Main Topics  . Smoking status: Never  Smoker   . Smokeless tobacco: Never Used  . Alcohol Use: No  . Drug Use: No  . Sexual Activity: Yes   Other Topics Concern  . None   Social History Narrative   Past Surgical History  Procedure Laterality Date  . Tubal ligation    . Temporomandibular joint surgery    . Partial knee arthroplasty    . Cholecystectomy    . Intraocular lens insertion Right 10-21-13   Past Medical History  Diagnosis Date  . Thyroid disorder   . Kidney disease   . Arthritis   . Hypertension   . Diabetes mellitus    BP 153/75 mmHg  Pulse 69  Resp 14  Ht 5' 5.5" (1.664 m)  Wt 214 lb (97.07 kg)  BMI 35.06 kg/m2  SpO2 96%  Opioid Risk Score:   Fall Risk Score: Moderate Fall Risk (6-13 points) Review of Systems  Constitutional:       Trouble walking  Respiratory: Positive for apnea.   Gastrointestinal: Positive for abdominal pain and constipation.  Genitourinary: Positive for dysuria, urgency, frequency and difficulty urinating.  Neurological: Positive for dizziness and weakness.  Psychiatric/Behavioral: The patient is nervous/anxious.   All other systems reviewed and are negative.      Objective:   Physical Exam  Constitutional: She is oriented to person, place, and time. She appears well-developed and well-nourished.  HENT:  Head: Normocephalic and  atraumatic.  Neck: Normal range of motion. Neck supple.  Cardiovascular: Normal rate and regular rhythm.   Pulmonary/Chest: Effort normal and breath sounds normal.  Musculoskeletal:  Normal Muscle Bulk and Muscle testing Reveals: Upper extremities: Full ROM and Muscle Strength 5/5 Lumbar Paraspinal Tenderness: L-3- L-5 Lower extremities: Full ROM and Muscle strength 5/5 Left Leg Flexion Produces pain into Patella Arises from chair with ease/ Using walker Antalgic Gait   Neurological: She is alert and oriented to person, place, and time.  Skin: Skin is warm and dry.  Psychiatric: She has a normal mood and affect.  Nursing note and  vitals reviewed.         Assessment & Plan:  1. Spinal stenosis chronic low back pain radiating into the legs: Continue MSIR 15 mg take one tablet by mouth three times daily #90 .  2. Diabetic Neuropathy: Continue Gabapentin.  3. Left Knee Pain: Continue Current Medication and exercise Regimen.  20 minutes of face to face patient care time was spent during this visit. All questions were encouraged and answered.  F/U in 1 month

## 2014-07-02 ENCOUNTER — Ambulatory Visit (HOSPITAL_COMMUNITY): Payer: Self-pay | Admitting: Psychiatry

## 2014-07-07 ENCOUNTER — Other Ambulatory Visit: Payer: Self-pay | Admitting: *Deleted

## 2014-07-07 DIAGNOSIS — Z01810 Encounter for preprocedural cardiovascular examination: Secondary | ICD-10-CM

## 2014-07-07 DIAGNOSIS — N184 Chronic kidney disease, stage 4 (severe): Secondary | ICD-10-CM

## 2014-07-12 ENCOUNTER — Encounter: Payer: Self-pay | Admitting: Registered Nurse

## 2014-07-12 ENCOUNTER — Encounter: Payer: Worker's Compensation | Attending: Registered Nurse | Admitting: Registered Nurse

## 2014-07-12 VITALS — BP 145/75 | HR 55 | Resp 14

## 2014-07-12 DIAGNOSIS — M4800 Spinal stenosis, site unspecified: Secondary | ICD-10-CM | POA: Diagnosis not present

## 2014-07-12 DIAGNOSIS — M79671 Pain in right foot: Secondary | ICD-10-CM | POA: Diagnosis not present

## 2014-07-12 DIAGNOSIS — G8929 Other chronic pain: Secondary | ICD-10-CM | POA: Diagnosis not present

## 2014-07-12 DIAGNOSIS — Z79899 Other long term (current) drug therapy: Secondary | ICD-10-CM

## 2014-07-12 DIAGNOSIS — M79672 Pain in left foot: Secondary | ICD-10-CM | POA: Insufficient documentation

## 2014-07-12 DIAGNOSIS — Z5181 Encounter for therapeutic drug level monitoring: Secondary | ICD-10-CM

## 2014-07-12 DIAGNOSIS — I10 Essential (primary) hypertension: Secondary | ICD-10-CM | POA: Insufficient documentation

## 2014-07-12 DIAGNOSIS — E1140 Type 2 diabetes mellitus with diabetic neuropathy, unspecified: Secondary | ICD-10-CM | POA: Insufficient documentation

## 2014-07-12 DIAGNOSIS — M1990 Unspecified osteoarthritis, unspecified site: Secondary | ICD-10-CM | POA: Diagnosis not present

## 2014-07-12 DIAGNOSIS — M79641 Pain in right hand: Secondary | ICD-10-CM | POA: Insufficient documentation

## 2014-07-12 DIAGNOSIS — M4806 Spinal stenosis, lumbar region: Secondary | ICD-10-CM

## 2014-07-12 DIAGNOSIS — M545 Low back pain: Secondary | ICD-10-CM

## 2014-07-12 DIAGNOSIS — N289 Disorder of kidney and ureter, unspecified: Secondary | ICD-10-CM | POA: Insufficient documentation

## 2014-07-12 DIAGNOSIS — M25562 Pain in left knee: Secondary | ICD-10-CM | POA: Diagnosis not present

## 2014-07-12 DIAGNOSIS — M79642 Pain in left hand: Secondary | ICD-10-CM | POA: Insufficient documentation

## 2014-07-12 DIAGNOSIS — E079 Disorder of thyroid, unspecified: Secondary | ICD-10-CM | POA: Diagnosis not present

## 2014-07-12 DIAGNOSIS — M5137 Other intervertebral disc degeneration, lumbosacral region: Secondary | ICD-10-CM

## 2014-07-12 DIAGNOSIS — Z760 Encounter for issue of repeat prescription: Secondary | ICD-10-CM | POA: Diagnosis not present

## 2014-07-12 DIAGNOSIS — M48061 Spinal stenosis, lumbar region without neurogenic claudication: Secondary | ICD-10-CM

## 2014-07-12 MED ORDER — MORPHINE SULFATE 15 MG PO TABS: 15.0000 mg | ORAL_TABLET | Freq: Three times a day (TID) | ORAL | Status: DC

## 2014-07-12 NOTE — Progress Notes (Signed)
Subjective:    Patient ID: Paige Arnold, female    DOB: 04-14-1941, 73 y.o.   MRN: [id number] HPI: Paige Arnold is a 73 year old female who returns for follow up for chronic pain and medication refill. She says her pain is located in her lower back and left knee. She rates her pain 6. Her current exercise regime is working with physical therapist twice a week in her home and walking. Her daughter in the room all questions answered. Daughter states she wants to speak with Dr. Letta Pate regarding a changed in analgesics. I spoke with Dr. Letta Pate a few months ago and his decisions was to maintain analgesics as prescribed. Also stated to the daughter Paige Arnold never asked again for a change in her medication only when the daughter is present. Paige Arnold spoke up she really would like something different due to increase intensity of pain. I will keep medication as prescribed and she will F/U with Dr. Letta Pate they verbalizes understanding. Arrived to office bradycardic at 55 vitals re-checked 136/76 and HR 65.  Pain Inventory Average Pain 6 Pain Right Now 6 My pain is constant, sharp, burning, dull, stabbing, tingling and aching  In the last 24 hours, has pain interfered with the following? General activity 0 Relation with others 3 Enjoyment of life 3 What TIME of day is your pain at its worst? ALL Sleep (in general) Poor  Pain is worse with: walking, bending, sitting, inactivity, standing and some activites Pain improves with: rest, heat/ice and medication Relief from Meds: 1  Mobility walk without assistance use a cane how many minutes can you walk? 3 ability to climb steps?  no  Function retired  Neuro/Psych bladder control problems bowel control problems weakness tingling trouble walking dizziness confusion depression anxiety loss of taste or smell  Prior Studies Any changes since last visit?  no  Physicians involved in your care Any changes since last visit?   no   Family History  Problem Relation Age of Onset  . Kidney disease Mother   . Heart disease Father   . Diabetes Brother   . Alcohol abuse Brother   . Depression Brother   . Sarcoidosis Brother   . ALS Brother   . Heart disease Brother    History   Social History  . Marital Status: Married    Spouse Name: N/A    Number of Children: N/A  . Years of Education: N/A   Social History Main Topics  . Smoking status: Never Smoker   . Smokeless tobacco: Never Used  . Alcohol Use: No  . Drug Use: No  . Sexual Activity: Yes   Other Topics Concern  . None   Social History Narrative   Past Surgical History  Procedure Laterality Date  . Tubal ligation    . Temporomandibular joint surgery    . Partial knee arthroplasty    . Cholecystectomy    . Intraocular lens insertion Right 10-21-13   Past Medical History  Diagnosis Date  . Thyroid disorder   . Kidney disease   . Arthritis   . Hypertension   . Diabetes mellitus    BP 145/75 mmHg  Pulse 55  Resp 14  SpO2 97%  Opioid Risk Score:   Fall Risk Score: Moderate Fall Risk (6-13 points)  Review of Systems  Constitutional: Positive for unexpected weight change.  HENT:       LOSS OF SMELL  Eyes: Negative.   Respiratory: Positive for shortness  of breath.   Cardiovascular: Positive for leg swelling.  Gastrointestinal: Positive for constipation.       BOWEL CONTROL PROBLEMS  Endocrine: Negative.   Genitourinary:       BLADDER CONTROL PROBLEMS  Musculoskeletal: Positive for myalgias, back pain and arthralgias.  Skin: Negative.   Allergic/Immunologic: Negative.   Neurological: Positive for weakness.       TINGLING, TROUBLE WALKING  Hematological: Negative.   Psychiatric/Behavioral: Positive for confusion and dysphoric mood. The patient is nervous/anxious.        Objective:   Physical Exam  Constitutional: She is oriented to person, place, and time. She appears well-developed and well-nourished.  HENT:  Head:  Normocephalic and atraumatic.  Neck: Normal range of motion. Neck supple.  Cardiovascular: Normal rate and regular rhythm.   Pulmonary/Chest: Effort normal and breath sounds normal.  Musculoskeletal:  Normal Muscle Bulk and Muscle Testing Reveals: Upper Extremities: Full ROM and Muscle strength 5/5 Thoracic Paraspinal Tenderness: T-1- T-4 Lumbar Paraspinal tenderness: L-3- L-5 Lower Extremities: Decreased ROM and Muscle strength 5/5 Left Leg Flexion Produces pain into Patella Arises from chair with ease Using straight Cane for support   Neurological: She is alert and oriented to person, place, and time.  Skin: Skin is warm and dry.  Psychiatric: She has a normal mood and affect.  Nursing note and vitals reviewed.         Assessment & Plan:  1. Spinal stenosis chronic low back pain radiating into the legs: Continue MSIR 15 mg take one tablet by mouth three times daily #90 .  2. Diabetic Neuropathy: Continue Gabapentin.  3. Left Knee Pain: Continue Current Medication and exercise Regimen.  20 minutes of face to face patient care time was spent during this visit. All questions were encouraged and answered.  F/U in 1 month

## 2014-08-06 ENCOUNTER — Encounter (HOSPITAL_COMMUNITY): Payer: Self-pay

## 2014-08-06 ENCOUNTER — Other Ambulatory Visit (HOSPITAL_COMMUNITY): Payer: Self-pay

## 2014-08-06 ENCOUNTER — Ambulatory Visit: Payer: Self-pay | Admitting: Vascular Surgery

## 2014-08-13 ENCOUNTER — Ambulatory Visit: Payer: Worker's Compensation | Admitting: Physical Medicine & Rehabilitation

## 2014-09-02 ENCOUNTER — Encounter: Payer: Worker's Compensation | Attending: Registered Nurse

## 2014-09-02 ENCOUNTER — Encounter: Payer: Self-pay | Admitting: Physical Medicine & Rehabilitation

## 2014-09-02 ENCOUNTER — Ambulatory Visit (HOSPITAL_BASED_OUTPATIENT_CLINIC_OR_DEPARTMENT_OTHER): Payer: Worker's Compensation | Admitting: Physical Medicine & Rehabilitation

## 2014-09-02 ENCOUNTER — Other Ambulatory Visit: Payer: Self-pay | Admitting: Physical Medicine & Rehabilitation

## 2014-09-02 VITALS — HR 70 | Resp 14

## 2014-09-02 DIAGNOSIS — E1140 Type 2 diabetes mellitus with diabetic neuropathy, unspecified: Secondary | ICD-10-CM | POA: Diagnosis not present

## 2014-09-02 DIAGNOSIS — E079 Disorder of thyroid, unspecified: Secondary | ICD-10-CM | POA: Insufficient documentation

## 2014-09-02 DIAGNOSIS — M79642 Pain in left hand: Secondary | ICD-10-CM | POA: Diagnosis not present

## 2014-09-02 DIAGNOSIS — Z79899 Other long term (current) drug therapy: Secondary | ICD-10-CM

## 2014-09-02 DIAGNOSIS — Z760 Encounter for issue of repeat prescription: Secondary | ICD-10-CM | POA: Diagnosis present

## 2014-09-02 DIAGNOSIS — N289 Disorder of kidney and ureter, unspecified: Secondary | ICD-10-CM | POA: Insufficient documentation

## 2014-09-02 DIAGNOSIS — G8929 Other chronic pain: Secondary | ICD-10-CM

## 2014-09-02 DIAGNOSIS — M545 Low back pain: Secondary | ICD-10-CM | POA: Insufficient documentation

## 2014-09-02 DIAGNOSIS — M25562 Pain in left knee: Secondary | ICD-10-CM

## 2014-09-02 DIAGNOSIS — M79672 Pain in left foot: Secondary | ICD-10-CM | POA: Insufficient documentation

## 2014-09-02 DIAGNOSIS — M4800 Spinal stenosis, site unspecified: Secondary | ICD-10-CM | POA: Diagnosis not present

## 2014-09-02 DIAGNOSIS — M79671 Pain in right foot: Secondary | ICD-10-CM | POA: Insufficient documentation

## 2014-09-02 DIAGNOSIS — M79641 Pain in right hand: Secondary | ICD-10-CM | POA: Diagnosis not present

## 2014-09-02 DIAGNOSIS — I10 Essential (primary) hypertension: Secondary | ICD-10-CM | POA: Diagnosis not present

## 2014-09-02 DIAGNOSIS — G90522 Complex regional pain syndrome I of left lower limb: Secondary | ICD-10-CM

## 2014-09-02 DIAGNOSIS — M1990 Unspecified osteoarthritis, unspecified site: Secondary | ICD-10-CM | POA: Insufficient documentation

## 2014-09-02 DIAGNOSIS — Z5181 Encounter for therapeutic drug level monitoring: Secondary | ICD-10-CM

## 2014-09-02 MED ORDER — HYDROCODONE-ACETAMINOPHEN 10-325 MG PO TABS: 1.0000 | ORAL_TABLET | Freq: Three times a day (TID) | ORAL | Status: DC

## 2014-09-02 NOTE — Progress Notes (Signed)
Subjective:    Patient ID: Paige Arnold, female    DOB: Jun 27, 1941, 74 y.o.   MRN: [id number] Chief complaint left knee pain HPI Another fall at home in [facility] w/cane Suffered left hip fracture. Was in a skilled nursing facility for 5 weeks. Her surgeon was at [facility].  As part of this she also injured her left knee. She states that her left knee is more swollen than it used to be. She had a knee orthosis however this no longer fits her.  She is now receiving home health therapy. She has follow-up appointment with her orthopedic surgeon at [facility] tomorrow. Of note is that the left knee pain is chronic and he was diagnosed with reflex sympathetic dystrophy postoperatively after left TKR in the 1990s Pain Inventory Average Pain 6 Pain Right Now 6 My pain is constant, sharp, stabbing, tingling and aching  In the last 24 hours, has pain interfered with the following? General activity 10 Relation with others 10 Enjoyment of life 10 What TIME of day is your pain at its worst? ALL Sleep (in general) Poor  Pain is worse with: walking, bending, sitting, standing and some activites Pain improves with: rest and medication Relief from Meds: 2  Mobility use a walker ability to climb steps?  no do you drive?  no  Function disabled: date disabled .  Neuro/Psych weakness numbness tremor tingling trouble walking spasms dizziness depression anxiety  Prior Studies Any changes since last visit?  no  Physicians involved in your care Any changes since last visit?  no   Family History  Problem Relation Age of Onset  . Kidney disease Mother   . Heart disease Father   . Diabetes Brother   . Alcohol abuse Brother   . Depression Brother   . Sarcoidosis Brother   . ALS Brother   . Heart disease Brother    History   Social History  . Marital Status: Married    Spouse Name: N/A  . Number of Children: N/A  . Years of Education: N/A   Social History Main  Topics  . Smoking status: Never Smoker   . Smokeless tobacco: Never Used  . Alcohol Use: No  . Drug Use: No  . Sexual Activity: Yes   Other Topics Concern  . None   Social History Narrative   Past Surgical History  Procedure Laterality Date  . Tubal ligation    . Temporomandibular joint surgery    . Partial knee arthroplasty    . Cholecystectomy    . Intraocular lens insertion Right 10-21-13   Past Medical History  Diagnosis Date  . Thyroid disorder   . Kidney disease   . Arthritis   . Hypertension   . Diabetes mellitus    Pulse 70  Resp 14  SpO2 98%  Opioid Risk Score:   Fall Risk Score: High Fall Risk (>13 points)  Review of Systems  Constitutional: Positive for unexpected weight change.  HENT: Negative.   Eyes: Negative.   Respiratory: Negative.   Cardiovascular: Positive for leg swelling.  Gastrointestinal: Positive for abdominal pain.  Endocrine: Negative.        High blood sugar  Genitourinary: Positive for dysuria.  Musculoskeletal: Positive for myalgias, back pain, joint swelling and arthralgias.  Skin: Negative.   Allergic/Immunologic: Negative.   Neurological: Positive for dizziness, tremors, weakness and numbness.       Tingling, trouble walking, spasms  Hematological: Negative.   Psychiatric/Behavioral: Positive for dysphoric mood. The  patient is nervous/anxious.        Objective:   Physical Exam  Left knee flexion to 70 Left knee extension to -10 2+ pitting edema bilateral lower extremities Stasis dermatitis left lower extremity greater than right lower extremity Healed left knee incision Tenderness to palpation even with light palpation around the entire knee joint area. Ambulates with a rolling walker. She requires some assistance to get up and down from the exam table.        Assessment & Plan:  1. Lumbar spinal stenosis and chronic pain is on , 3 times per day  No progressive neuro deficit, functional status stable, pt satisfied  with pain relief despite reporting 8/10 pain  2. Right hip troch bursitis This is doing relatively well will not inject the hip today  3. Chronic postoperative pain left knee exacerbated by fall at her home. Will check x-ray, if any dislodgment of the total knee prosthesis would refer back to orthopedic surgery  4.Left hip fracture status post hemiarthroplasty performed by an orthopedist in [facility] has follow-up appointment tomorrow.  5. Debility after hip fracture, continue home health therapy  6. Chronic pain syndrome we discussed medications. She would like to try another narcotic analgesic medication. We discussed that there may not be much difference but we can try hydrocodone 10 mg 3 times per day in place of morphine.  Over half of the 25 min visit was spent counseling and coordinating care.We spent time updating medical history given that she was treated outside of the [facility] system. In addition we discussed the pros and cons of medication changes with chronic narcotic analgesics. We discussed the specifics of hydrocodone versus morphine in regards to the acetaminophen component. Both  patient's and her daughter's questions were answered

## 2014-09-02 NOTE — Patient Instructions (Addendum)
If you take Tylenol on top of the hydrocodone, take only 1 tablet at a time  If x-ray shows problems with the total knee replacement need to see Dr. Percell Miller

## 2014-09-03 LAB — PMP ALCOHOL METABOLITE (ETG): ETGU: NEGATIVE ng/mL

## 2014-09-07 LAB — OPIATES/OPIOIDS (LC/MS-MS)
Codeine Urine: NEGATIVE ng/mL (ref <50)
HYDROMORPHONE: 107 ng/mL (ref <50)
Hydrocodone: NEGATIVE ng/mL (ref <50)
Morphine Urine: 23234 ng/mL (ref <50)
NORHYDROCODONE, UR: NEGATIVE ng/mL (ref <50)
NOROXYCODONE, UR: NEGATIVE ng/mL (ref <50)
OXYCODONE, UR: NEGATIVE ng/mL (ref <50)
OXYMORPHONE, URINE: NEGATIVE ng/mL (ref <50)

## 2014-09-08 LAB — PRESCRIPTION MONITORING PROFILE (SOLSTAS)
Amphetamine/Meth: NEGATIVE ng/mL
BENZODIAZEPINE SCREEN, URINE: NEGATIVE ng/mL
Barbiturate Screen, Urine: NEGATIVE ng/mL
Buprenorphine, Urine: NEGATIVE ng/mL
CARISOPRODOL, URINE: NEGATIVE ng/mL
Cannabinoid Scrn, Ur: NEGATIVE ng/mL
Cocaine Metabolites: NEGATIVE ng/mL
Creatinine, Urine: 79.3 mg/dL (ref >20.0)
FENTANYL URINE: NEGATIVE ng/mL
MDMA URINE: NEGATIVE ng/mL
Meperidine, Ur: NEGATIVE ng/mL
Methadone Screen, Urine: NEGATIVE ng/mL
NITRITES URINE, INITIAL: NEGATIVE ug/mL
Oxycodone Screen, Ur: NEGATIVE ng/mL
PROPOXYPHENE: NEGATIVE ng/mL
Tapentadol, urine: NEGATIVE ng/mL
Tramadol Scrn, Ur: NEGATIVE ng/mL
Zolpidem, Urine: NEGATIVE ng/mL
pH, Initial: 7.7 pH (ref 4.5–8.9)

## 2014-09-17 NOTE — Progress Notes (Signed)
Urine drug screen for this encounter is consistent for prescribed medication 

## 2014-09-24 ENCOUNTER — Encounter (HOSPITAL_COMMUNITY): Payer: Self-pay

## 2014-09-24 ENCOUNTER — Ambulatory Visit: Payer: Self-pay | Admitting: Vascular Surgery

## 2014-09-24 ENCOUNTER — Other Ambulatory Visit (HOSPITAL_COMMUNITY): Payer: Self-pay

## 2014-09-29 ENCOUNTER — Ambulatory Visit: Payer: Self-pay | Admitting: Vascular Surgery

## 2014-09-29 ENCOUNTER — Other Ambulatory Visit (HOSPITAL_COMMUNITY): Payer: Self-pay

## 2014-09-29 ENCOUNTER — Encounter (HOSPITAL_COMMUNITY): Payer: Self-pay

## 2014-10-01 ENCOUNTER — Ambulatory Visit (HOSPITAL_BASED_OUTPATIENT_CLINIC_OR_DEPARTMENT_OTHER): Payer: Worker's Compensation | Admitting: Physical Medicine & Rehabilitation

## 2014-10-01 ENCOUNTER — Encounter: Payer: Self-pay | Admitting: Physical Medicine & Rehabilitation

## 2014-10-01 ENCOUNTER — Encounter: Payer: Worker's Compensation | Attending: Registered Nurse

## 2014-10-01 VITALS — BP 134/56 | HR 82 | Resp 14

## 2014-10-01 DIAGNOSIS — M79641 Pain in right hand: Secondary | ICD-10-CM | POA: Insufficient documentation

## 2014-10-01 DIAGNOSIS — M1990 Unspecified osteoarthritis, unspecified site: Secondary | ICD-10-CM | POA: Diagnosis not present

## 2014-10-01 DIAGNOSIS — M25562 Pain in left knee: Secondary | ICD-10-CM | POA: Diagnosis not present

## 2014-10-01 DIAGNOSIS — G8929 Other chronic pain: Secondary | ICD-10-CM | POA: Insufficient documentation

## 2014-10-01 DIAGNOSIS — M79671 Pain in right foot: Secondary | ICD-10-CM | POA: Insufficient documentation

## 2014-10-01 DIAGNOSIS — E079 Disorder of thyroid, unspecified: Secondary | ICD-10-CM | POA: Diagnosis not present

## 2014-10-01 DIAGNOSIS — M4800 Spinal stenosis, site unspecified: Secondary | ICD-10-CM | POA: Diagnosis not present

## 2014-10-01 DIAGNOSIS — G90522 Complex regional pain syndrome I of left lower limb: Secondary | ICD-10-CM

## 2014-10-01 DIAGNOSIS — N289 Disorder of kidney and ureter, unspecified: Secondary | ICD-10-CM | POA: Insufficient documentation

## 2014-10-01 DIAGNOSIS — M79642 Pain in left hand: Secondary | ICD-10-CM | POA: Insufficient documentation

## 2014-10-01 DIAGNOSIS — M79672 Pain in left foot: Secondary | ICD-10-CM | POA: Diagnosis not present

## 2014-10-01 DIAGNOSIS — Z760 Encounter for issue of repeat prescription: Secondary | ICD-10-CM | POA: Insufficient documentation

## 2014-10-01 DIAGNOSIS — E1140 Type 2 diabetes mellitus with diabetic neuropathy, unspecified: Secondary | ICD-10-CM | POA: Diagnosis not present

## 2014-10-01 DIAGNOSIS — M5137 Other intervertebral disc degeneration, lumbosacral region: Secondary | ICD-10-CM

## 2014-10-01 DIAGNOSIS — M545 Low back pain: Secondary | ICD-10-CM | POA: Insufficient documentation

## 2014-10-01 DIAGNOSIS — M4806 Spinal stenosis, lumbar region: Secondary | ICD-10-CM

## 2014-10-01 DIAGNOSIS — I10 Essential (primary) hypertension: Secondary | ICD-10-CM | POA: Diagnosis not present

## 2014-10-01 DIAGNOSIS — M48061 Spinal stenosis, lumbar region without neurogenic claudication: Secondary | ICD-10-CM

## 2014-10-01 MED ORDER — MORPHINE SULFATE 15 MG PO TABS: 15.0000 mg | ORAL_TABLET | Freq: Four times a day (QID) | ORAL | Status: DC | PRN

## 2014-10-01 NOTE — Patient Instructions (Signed)
Stop hydrocodone, switch back to Morphine

## 2014-10-01 NOTE — Progress Notes (Signed)
Subjective:    Patient ID: Paige Arnold, female    DOB: April 23, 1941, 74 y.o.   MRN: [id number]  HPI Patient was seen last month for her chronic pain complaints. She is back pain and hip pain and knee pain. Had surgery for hip fracture In January. Now is at home with home health coming in. Ambulating with walker. Prior to fall she was ambulating with a cane  Patient tried switching from morphine sulfate 15 mg 3 times a day to hydrocodone 10 3 times a day neck last month. Patient does not feel like the hydrocodone works as well as the morphine  Pain Inventory Average Pain 5 Pain Right Now 7 My pain is constant, sharp, burning, dull, stabbing, tingling and aching  In the last 24 hours, has pain interfered with the following? General activity 2 Relation with others 1 Enjoyment of life 1 What TIME of day is your pain at its worst? ALL Sleep (in general) Fair  Pain is worse with: walking, bending and standing Pain improves with: rest and medication Relief from Meds: 0  Mobility use a walker how many minutes can you walk? 10-15 ability to climb steps?  no do you drive?  no  Function retired  Neuro/Psych confusion depression anxiety  Prior Studies Any changes since last visit?  no  Physicians involved in your care Any changes since last visit?  no   Family History  Problem Relation Age of Onset  . Kidney disease Mother   . Heart disease Father   . Diabetes Brother   . Alcohol abuse Brother   . Depression Brother   . Sarcoidosis Brother   . ALS Brother   . Heart disease Brother    History   Social History  . Marital Status: Married    Spouse Name: N/A  . Number of Children: N/A  . Years of Education: N/A   Social History Main Topics  . Smoking status: Never Smoker   . Smokeless tobacco: Never Used  . Alcohol Use: No  . Drug Use: No  . Sexual Activity: Yes   Other Topics Concern  . None   Social History Narrative   Past Surgical History  Procedure  Laterality Date  . Tubal ligation    . Temporomandibular joint surgery    . Partial knee arthroplasty    . Cholecystectomy    . Intraocular lens insertion Right 10-21-13   Past Medical History  Diagnosis Date  . Thyroid disorder   . Kidney disease   . Arthritis   . Hypertension   . Diabetes mellitus    BP 134/56 mmHg  Pulse 82  Resp 14  SpO2 99%  Opioid Risk Score:   Fall Risk Score: Moderate Fall Risk (6-13 points)`1  Depression screen PHQ 2/9  Depression screen PHQ 2/9 10/01/2014  Decreased Interest 1  Down, Depressed, Hopeless 2  PHQ - 2 Score 3  Altered sleeping 2  Tired, decreased energy 2  Change in appetite 1  Feeling bad or failure about yourself  0  Trouble concentrating 1  Moving slowly or fidgety/restless 1  Suicidal thoughts 0  PHQ-9 Score 10     Review of Systems  Constitutional:       Night sweats  Eyes: Negative.   Respiratory: Negative.   Cardiovascular: Negative.   Gastrointestinal: Positive for abdominal pain.  Endocrine:       High blood sugar  Musculoskeletal: Positive for myalgias, back pain and arthralgias.  Hip, back knee pain. Neuropathy in feet  Skin: Negative.   Allergic/Immunologic: Negative.   Hematological: Negative.   Psychiatric/Behavioral: Positive for confusion and dysphoric mood. The patient is nervous/anxious.        Objective:   Physical Exam  Constitutional: She is oriented to person, place, and time. She appears well-developed and well-nourished.  HENT:  Head: Normocephalic and atraumatic.  Eyes: Pupils are equal, round, and reactive to light.  Neck: Normal range of motion.  Neurological: She is alert and oriented to person, place, and time. Gait abnormal.  Ambulates with a rolling walker.  Psychiatric: She has a normal mood and affect.  Nursing note and vitals reviewed.         Assessment & Plan:  1. Lumbar spinal stenosis and chronic pain is on ,Hydrocodone 3 times per day  No progressive neuro  deficit, functional status stable, pt dissatisfied with pain relief despite reporting 5/10 pain Which is less than last time 2. Right hip troch bursitis This is doing relatively well will not inject the hip today   3.Left hip fracture status post hemiarthroplasty performed by an orthopedist in [facility] has follow-up appointment tomorrow.  4. Debility after hip fracture, continue home health therapy  5. Chronic pain syndrome we discussed medications. Patient would like to go back to morphine Immediate release 15 mg 3 times a day.  Continue opioid monitoring program. This consists of regular clinic visits, examinations, urine drug screen, pill counts as well as use of [place] controlled substance reporting System. Has one hydrocodone tablet left at home. Nurse practitioner visit one month

## 2014-10-29 ENCOUNTER — Encounter: Payer: Self-pay | Admitting: Registered Nurse

## 2014-10-29 ENCOUNTER — Encounter: Payer: Worker's Compensation | Attending: Registered Nurse | Admitting: Registered Nurse

## 2014-10-29 VITALS — BP 140/55 | HR 68 | Resp 14

## 2014-10-29 DIAGNOSIS — E1140 Type 2 diabetes mellitus with diabetic neuropathy, unspecified: Secondary | ICD-10-CM | POA: Insufficient documentation

## 2014-10-29 DIAGNOSIS — G8929 Other chronic pain: Secondary | ICD-10-CM | POA: Insufficient documentation

## 2014-10-29 DIAGNOSIS — Z760 Encounter for issue of repeat prescription: Secondary | ICD-10-CM | POA: Insufficient documentation

## 2014-10-29 DIAGNOSIS — M79671 Pain in right foot: Secondary | ICD-10-CM | POA: Diagnosis not present

## 2014-10-29 DIAGNOSIS — M79641 Pain in right hand: Secondary | ICD-10-CM | POA: Insufficient documentation

## 2014-10-29 DIAGNOSIS — N289 Disorder of kidney and ureter, unspecified: Secondary | ICD-10-CM | POA: Diagnosis not present

## 2014-10-29 DIAGNOSIS — Z5181 Encounter for therapeutic drug level monitoring: Secondary | ICD-10-CM

## 2014-10-29 DIAGNOSIS — M545 Low back pain: Secondary | ICD-10-CM | POA: Diagnosis not present

## 2014-10-29 DIAGNOSIS — M47817 Spondylosis without myelopathy or radiculopathy, lumbosacral region: Secondary | ICD-10-CM

## 2014-10-29 DIAGNOSIS — M4800 Spinal stenosis, site unspecified: Secondary | ICD-10-CM | POA: Insufficient documentation

## 2014-10-29 DIAGNOSIS — E079 Disorder of thyroid, unspecified: Secondary | ICD-10-CM | POA: Insufficient documentation

## 2014-10-29 DIAGNOSIS — M79672 Pain in left foot: Secondary | ICD-10-CM | POA: Diagnosis not present

## 2014-10-29 DIAGNOSIS — M1990 Unspecified osteoarthritis, unspecified site: Secondary | ICD-10-CM | POA: Diagnosis not present

## 2014-10-29 DIAGNOSIS — M79642 Pain in left hand: Secondary | ICD-10-CM | POA: Insufficient documentation

## 2014-10-29 DIAGNOSIS — Z79899 Other long term (current) drug therapy: Secondary | ICD-10-CM

## 2014-10-29 DIAGNOSIS — M5416 Radiculopathy, lumbar region: Secondary | ICD-10-CM

## 2014-10-29 DIAGNOSIS — I10 Essential (primary) hypertension: Secondary | ICD-10-CM | POA: Diagnosis not present

## 2014-10-29 DIAGNOSIS — M25562 Pain in left knee: Secondary | ICD-10-CM | POA: Diagnosis not present

## 2014-10-29 MED ORDER — MORPHINE SULFATE 15 MG PO TABS: 15.0000 mg | ORAL_TABLET | Freq: Three times a day (TID) | ORAL | Status: DC

## 2014-10-29 NOTE — Progress Notes (Signed)
Subjective:    Patient ID: Paige Arnold, female    DOB: 1940/08/31, 74 y.o.   MRN: [id number]  HPI: Paige Arnold is a 74 year old female who returns for follow up for chronic pain and medication refill. She says her pain is located in her lower back and left knee. She rates her pain 6. Her current exercise regime is walking with her walker in her home. Her physical therapy has been discontinued due to increase intensity of left hip pain she states.  MSIR 10/04/14 # 90/12 tablets.  Pain Inventory Average Pain 5 Pain Right Now 8 My pain is constant, sharp, burning, dull, stabbing, tingling, aching and throbbing  In the last 24 hours, has pain interfered with the following? General activity 2 Relation with others 1 Enjoyment of life 2 What TIME of day is your pain at its worst? ALL Sleep (in general) Fair  Pain is worse with: walking, bending, standing and some activites Pain improves with: rest, heat/ice and medication Relief from Meds: 4  Mobility walk without assistance use a walker how many minutes can you walk? 5 ability to climb steps?  no do you drive?  no  Function disabled: date disabled .  Neuro/Psych weakness trouble walking spasms confusion depression anxiety  Prior Studies Any changes since last visit?  no  Physicians involved in your care Any changes since last visit?  no   Family History  Problem Relation Age of Onset  . Kidney disease Mother   . Heart disease Father   . Diabetes Brother   . Alcohol abuse Brother   . Depression Brother   . Sarcoidosis Brother   . ALS Brother   . Heart disease Brother    History   Social History  . Marital Status: Married    Spouse Name: N/A  . Number of Children: N/A  . Years of Education: N/A   Social History Main Topics  . Smoking status: Never Smoker   . Smokeless tobacco: Never Used  . Alcohol Use: No  . Drug Use: No  . Sexual Activity: Yes   Other Topics Concern  . None   Social  History Narrative   Past Surgical History  Procedure Laterality Date  . Tubal ligation    . Temporomandibular joint surgery    . Partial knee arthroplasty    . Cholecystectomy    . Intraocular lens insertion Right 10-21-13   Past Medical History  Diagnosis Date  . Thyroid disorder   . Kidney disease   . Arthritis   . Hypertension   . Diabetes mellitus    BP 140/55 mmHg  Pulse 68  Resp 14  SpO2 97%  Opioid Risk Score:   Fall Risk Score: Moderate Fall Risk (6-13 points)`1  Depression screen PHQ 2/9  Depression screen PHQ 2/9 10/01/2014  Decreased Interest 1  Down, Depressed, Hopeless 2  PHQ - 2 Score 3  Altered sleeping 2  Tired, decreased energy 2  Change in appetite 1  Feeling bad or failure about yourself  0  Trouble concentrating 1  Moving slowly or fidgety/restless 1  Suicidal thoughts 0  PHQ-9 Score 10     Review of Systems  Constitutional:       High  Blood sugar, easy bleeding  HENT: Negative.   Eyes: Negative.   Respiratory: Negative.   Cardiovascular: Negative.   Gastrointestinal: Negative.   Endocrine: Negative.   Genitourinary: Positive for dysuria and difficulty urinating.  Musculoskeletal: Positive for myalgias,  back pain and arthralgias.  Skin: Negative.   Allergic/Immunologic: Negative.   Neurological: Positive for dizziness and weakness.       Trouble walking, spasms  Hematological: Negative.   Psychiatric/Behavioral: Positive for confusion and dysphoric mood. The patient is nervous/anxious.        Objective:   Physical Exam  Constitutional: She is oriented to person, place, and time. She appears well-developed and well-nourished.  HENT:  Head: Normocephalic and atraumatic.  Neck: Normal range of motion. Neck supple.  Cardiovascular: Normal rate and regular rhythm.   Pulmonary/Chest: Effort normal and breath sounds normal.  Musculoskeletal:  Normal Muscle Bulk and Muscle Testing Reveals: Upper Extremities: Decreased ROM 90 Degrees  and Muscle strength 4/5 Thoracic Hypersensitivity Lumbar Paraspinal Tenderness: L-3- L-5 Lower Extremities: Right: Full ROM and Muscle Strength 5/5 Left: Decreased ROM and Muscle Strength 5/5 Left Lower Extremity Flexion Produces pain into Patella  Arises from chair with ease/ Using walker for support Narrow Based Gait    Neurological: She is alert and oriented to person, place, and time.  Skin: Skin is warm and dry.  Psychiatric: She has a normal mood and affect.  Nursing note and vitals reviewed.         Assessment & Plan:  1. Spinal stenosis chronic low back pain radiating into the legs: Continue MSIR 15 mg take one tablet by mouth three times daily #90 .  2. Diabetic Neuropathy: Continue Gabapentin.  3. Left Knee Pain: Continue Current Medication and exercise Regimen.  4. Left Hip Fracture S/P hemiarthroplasty: Orthopedist Follwing at [facility]  20 minutes of face to face patient care time was spent during this visit. All questions were encouraged and answered.  F/U in 1 month

## 2014-10-31 ENCOUNTER — Other Ambulatory Visit: Payer: Self-pay | Admitting: Internal Medicine

## 2014-11-18 ENCOUNTER — Ambulatory Visit (INDEPENDENT_AMBULATORY_CARE_PROVIDER_SITE_OTHER): Payer: Medicare Other | Admitting: Endocrinology

## 2014-11-18 ENCOUNTER — Encounter: Payer: Self-pay | Admitting: Endocrinology

## 2014-11-18 VITALS — BP 144/68 | HR 66 | Temp 98.4°F | Ht 65.5 in | Wt 206.0 lb

## 2014-11-18 DIAGNOSIS — E118 Type 2 diabetes mellitus with unspecified complications: Secondary | ICD-10-CM

## 2014-11-18 DIAGNOSIS — IMO0002 Reserved for concepts with insufficient information to code with codable children: Secondary | ICD-10-CM

## 2014-11-18 DIAGNOSIS — E1165 Type 2 diabetes mellitus with hyperglycemia: Secondary | ICD-10-CM

## 2014-11-18 LAB — T4, FREE: Free T4: 0.92 ng/dL (ref 0.60–1.60)

## 2014-11-18 LAB — TSH: TSH: 0.75 u[IU]/mL (ref 0.35–4.50)

## 2014-11-18 NOTE — Patient Instructions (Signed)
blood tests are requested for you today.  We'll let you know about the results.  

## 2014-11-18 NOTE — Progress Notes (Signed)
Subjective:    Patient ID: Paige Arnold, female    DOB: Jul 30, 1940, 74 y.o.   MRN: [id number]  HPI Pt returns for f/u of chronic primary hypothyroidism (dx'ed approx 1984; she has been on synthroid since then; she has never had thyroid imaging).  She has moderate hair loss throughout the head, and assoc fatigue Past Medical History  Diagnosis Date  . Thyroid disorder   . Kidney disease   . Arthritis   . Hypertension   . Diabetes mellitus     Past Surgical History  Procedure Laterality Date  . Tubal ligation    . Temporomandibular joint surgery    . Partial knee arthroplasty    . Cholecystectomy    . Intraocular lens insertion Right 10-21-13    History   Social History  . Marital Status: Married    Spouse Name: N/A  . Number of Children: N/A  . Years of Education: N/A   Occupational History  . Not on file.   Social History Main Topics  . Smoking status: Never Smoker   . Smokeless tobacco: Never Used  . Alcohol Use: No  . Drug Use: No  . Sexual Activity: Yes   Other Topics Concern  . Not on file   Social History Narrative    Current Outpatient Prescriptions on File Prior to Visit  Medication Sig Dispense Refill  . amLODipine (NORVASC) 10 MG tablet Take 10 mg by mouth daily.    Marland Kitchen aspirin 81 MG tablet Take 81 mg by mouth daily.    . busPIRone (BUSPAR) 5 MG tablet Take 5 mg by mouth.    . cloNIDine (CATAPRES) 0.2 MG tablet Take 0.2 mg by mouth 2 (two) times daily.    . Cyanocobalamin (VITAMIN B-12 IJ) Inject as directed every 30 (thirty) days.    . divalproex (DEPAKOTE ER) 250 MG 24 hr tablet 1 every morning  for 1 week then 2 every morning.    . donepezil (ARICEPT) 10 MG tablet Take 10 mg by mouth at bedtime. 1 q hs    . DULoxetine (CYMBALTA) 30 MG capsule Take 30 mg by mouth daily.    . furosemide (LASIX) 40 MG tablet Take 1 tablet (40 mg total) by mouth 2 (two) times daily. 60 tablet 0  . gabapentin (NEURONTIN) 400 MG capsule Take 400 mg by mouth at bedtime.      Marland Kitchen levothyroxine (SYNTHROID, LEVOTHROID) 150 MCG tablet Take 150 mcg by mouth daily before breakfast.    . morphine (MSIR) 15 MG tablet Take 1 tablet (15 mg total) by mouth 3 (three) times daily. 90 tablet 0  . Multiple Vitamin (MULTIVITAMIN WITH MINERALS) TABS tablet Take 1 tablet by mouth daily.    . phenazopyridine (PYRIDIUM) 100 MG tablet Take 100 mg by mouth as needed for pain.    . pravastatin (PRAVACHOL) 40 MG tablet Take 40 mg by mouth daily.    . sitaGLIPtin (JANUVIA) 100 MG tablet Take 50 mg by mouth daily.      No current facility-administered medications on file prior to visit.    Allergies  Allergen Reactions  . Sulfa Antibiotics Itching    Family History  Problem Relation Age of Onset  . Kidney disease Mother   . Heart disease Father   . Diabetes Brother   . Alcohol abuse Brother   . Depression Brother   . Sarcoidosis Brother   . ALS Brother   . Heart disease Brother     BP 144/68 mmHg  Pulse 66  Temp(Src) 98.4 F (36.9 C) (Oral)  Ht 5' 5.5" (1.664 m)  Wt 206 lb (93.441 kg)  BMI 33.75 kg/m2  SpO2 95%  Review of Systems She has lost weight, since a recent hip fx.    Objective:   Physical Exam VITAL SIGNS:  See vs page GENERAL: no distress Head: moderate diffuse alopecia. NECK: There is no palpable thyroid enlargement.  No thyroid nodule is palpable.  No palpable lymphadenopathy at the anterior neck.    Lab Results  Component Value Date   TSH 0.75 11/18/2014      Assessment & Plan:    Patient is advised the following: Patient Instructions  blood tests are requested for you today.  We'll let you know about the results.   addendum: Please continue the same medication

## 2014-11-25 ENCOUNTER — Ambulatory Visit: Payer: Self-pay | Admitting: Registered Nurse

## 2014-11-26 ENCOUNTER — Encounter: Payer: Worker's Compensation | Attending: Registered Nurse | Admitting: Registered Nurse

## 2014-11-26 ENCOUNTER — Encounter: Payer: Self-pay | Admitting: Registered Nurse

## 2014-11-26 VITALS — BP 152/70 | HR 56 | Resp 14

## 2014-11-26 DIAGNOSIS — M47817 Spondylosis without myelopathy or radiculopathy, lumbosacral region: Secondary | ICD-10-CM | POA: Diagnosis present

## 2014-11-26 DIAGNOSIS — Z5181 Encounter for therapeutic drug level monitoring: Secondary | ICD-10-CM | POA: Insufficient documentation

## 2014-11-26 DIAGNOSIS — Z79899 Other long term (current) drug therapy: Secondary | ICD-10-CM | POA: Diagnosis not present

## 2014-11-26 DIAGNOSIS — M25562 Pain in left knee: Secondary | ICD-10-CM

## 2014-11-26 DIAGNOSIS — G90522 Complex regional pain syndrome I of left lower limb: Secondary | ICD-10-CM | POA: Insufficient documentation

## 2014-11-26 MED ORDER — MORPHINE SULFATE 15 MG PO TABS: 15.0000 mg | ORAL_TABLET | Freq: Three times a day (TID) | ORAL | Status: DC

## 2014-11-26 MED ORDER — LIDOCAINE 5 % EX PTCH: 1.0000 | MEDICATED_PATCH | Freq: Two times a day (BID) | CUTANEOUS | Status: DC

## 2014-11-26 NOTE — Progress Notes (Signed)
Subjective:    Patient ID: Paige Arnold, female    DOB: [birth year], 74 y.o.   MRN: [id number]  HPI: [name] is a 74 year old female who returns for follow up for chronic pain and medication refill. She says her pain is located in her left knee. She rates her pain 7. Her current exercise regime is walking with her walker in her home.  Arrived bradycardic apical pulse checked 60.  Pain Inventory Average Pain 7 Pain Right Now 7 My pain is sharp, burning, dull, stabbing, tingling and aching  In the last 24 hours, has pain interfered with the following? General activity 1 Relation with others 0 Enjoyment of life 0 What TIME of day is your pain at its worst? all Sleep (in general) Fair  Pain is worse with: walking, bending, sitting, standing and some activites Pain improves with: rest, heat/ice and medication Relief from Meds: 5  Mobility walk with assistance use a walker ability to climb steps?  no do you drive?  no Do you have any goals in this area?  no  Function disabled: date disabled . retired I need assistance with the following:  meal prep, household duties and shopping  Neuro/Psych weakness numbness tremor tingling trouble walking dizziness depression anxiety  Prior Studies Any changes since last visit?  no  Physicians involved in your care Any changes since last visit?  no   Family History  Problem Relation Age of Onset  . Kidney disease Mother   . Heart disease Father   . Diabetes Brother   . Alcohol abuse Brother   . Depression Brother   . Sarcoidosis Brother   . ALS Brother   . Heart disease Brother    History   Social History  . Marital Status: Married    Spouse Name: N/A  . Number of Children: N/A  . Years of Education: N/A   Social History Main Topics  . Smoking status: Never Smoker   . Smokeless tobacco: Never Used  . Alcohol Use: No  . Drug Use: No  . Sexual Activity: Yes   Other Topics Concern  . None   Social  History Narrative   Past Surgical History  Procedure Laterality Date  . Tubal ligation    . Temporomandibular joint surgery    . Partial knee arthroplasty    . Cholecystectomy    . Intraocular lens insertion Right 10-21-13   Past Medical History  Diagnosis Date  . Thyroid disorder   . Kidney disease   . Arthritis   . Hypertension   . Diabetes mellitus    BP 152/70 mmHg  Pulse 56  Resp 14  SpO2 96%  Opioid Risk Score:   Fall Risk Score: Moderate Fall Risk (6-13 points)`1  Depression screen PHQ 2/9  Depression screen PHQ 2/9 10/01/2014  Decreased Interest 1  Down, Depressed, Hopeless 2  PHQ - 2 Score 3  Altered sleeping 2  Tired, decreased energy 2  Change in appetite 1  Feeling bad or failure about yourself  0  Trouble concentrating 1  Moving slowly or fidgety/restless 1  Suicidal thoughts 0  PHQ-9 Score 10     Review of Systems  Constitutional:       Night sweats  Cardiovascular: Positive for leg swelling.  Gastrointestinal: Positive for constipation.  Endocrine:       High blood sugar  Genitourinary: Positive for dysuria and difficulty urinating.  Musculoskeletal: Positive for gait problem.  Neurological: Positive for dizziness,  tremors, weakness and numbness.       Tingling  Psychiatric/Behavioral: Positive for dysphoric mood. The patient is nervous/anxious.   All other systems reviewed and are negative.      Objective:   Physical Exam  Constitutional: She is oriented to person, place, and time. She appears well-developed and well-nourished.  HENT:  Head: Normocephalic and atraumatic.  Neck: Normal range of motion. Neck supple.  Cardiovascular: Normal rate and regular rhythm.   Pulmonary/Chest: Effort normal and breath sounds normal.  Musculoskeletal:  Normal Muscle Bulk and Muscle Testing Reveals: Upper extremities: Full ROM and Muscle Strength 5/5 Thoracic Paraspinal Tenderness: T-2 Left Side Lumbar Paraspinal Tenderness: L-3- L-4 Lower  Extremities: Full ROM and Muscle Strength 5/5 Left Patella Tenderness with  palpation laterally.  Arises from chair with ease/ Using cadillac Walker Narrow Based gait  Neurological: She is alert and oriented to person, place, and time.  Skin: Skin is warm and dry.  Psychiatric: She has a normal mood and affect.  Nursing note and vitals reviewed.         Assessment & Plan:  1. Spinal stenosis chronic low back pain radiating into the legs: Continue MSIR 15 mg take one tablet by mouth three times daily #90 .  2. Diabetic Neuropathy: Continue Gabapentin.  3. Left Knee Pain: Continue Current Medication and exercise Regimen. ( RSD) Continue Lidoderm Patches. 4. Left Hip Fracture S/P hemiarthroplasty: Orthopedist [facility] at [facility]  20 minutes of face to face patient care time was spent during this visit. All questions were encouraged and answered.   F/U in 1 month

## 2014-11-29 ENCOUNTER — Telehealth: Payer: Self-pay | Admitting: Endocrinology

## 2014-11-29 MED ORDER — LEVOTHYROXINE SODIUM 150 MCG PO TABS: 150.0000 ug | ORAL_TABLET | Freq: Every day | ORAL | Status: DC

## 2014-11-29 NOTE — Telephone Encounter (Signed)
Patient has been notified of recent lab work and wanted to know if we changed her medication from the Generic Levothyroxine to the brand name if this adjustment would help with her hair loss. Please advise, Thanks!

## 2014-11-29 NOTE — Telephone Encounter (Signed)
done

## 2014-11-29 NOTE — Telephone Encounter (Signed)
Please call with lab results 

## 2014-11-30 NOTE — Telephone Encounter (Signed)
Left voicemail advising rx for medication has been sent. Requested call back if patient would like to discuss.

## 2014-12-28 ENCOUNTER — Encounter: Payer: Worker's Compensation | Attending: Registered Nurse | Admitting: Registered Nurse

## 2014-12-28 ENCOUNTER — Encounter: Payer: Self-pay | Admitting: Registered Nurse

## 2014-12-28 ENCOUNTER — Other Ambulatory Visit: Payer: Self-pay | Admitting: Physical Medicine & Rehabilitation

## 2014-12-28 VITALS — BP 122/57 | HR 60 | Resp 14

## 2014-12-28 DIAGNOSIS — E079 Disorder of thyroid, unspecified: Secondary | ICD-10-CM | POA: Diagnosis not present

## 2014-12-28 DIAGNOSIS — M25562 Pain in left knee: Secondary | ICD-10-CM | POA: Diagnosis not present

## 2014-12-28 DIAGNOSIS — G894 Chronic pain syndrome: Secondary | ICD-10-CM

## 2014-12-28 DIAGNOSIS — E1140 Type 2 diabetes mellitus with diabetic neuropathy, unspecified: Secondary | ICD-10-CM | POA: Diagnosis not present

## 2014-12-28 DIAGNOSIS — M79671 Pain in right foot: Secondary | ICD-10-CM | POA: Diagnosis not present

## 2014-12-28 DIAGNOSIS — Z79899 Other long term (current) drug therapy: Secondary | ICD-10-CM

## 2014-12-28 DIAGNOSIS — Z5181 Encounter for therapeutic drug level monitoring: Secondary | ICD-10-CM

## 2014-12-28 DIAGNOSIS — M79641 Pain in right hand: Secondary | ICD-10-CM | POA: Diagnosis not present

## 2014-12-28 DIAGNOSIS — M1990 Unspecified osteoarthritis, unspecified site: Secondary | ICD-10-CM | POA: Insufficient documentation

## 2014-12-28 DIAGNOSIS — G8929 Other chronic pain: Secondary | ICD-10-CM | POA: Insufficient documentation

## 2014-12-28 DIAGNOSIS — Z760 Encounter for issue of repeat prescription: Secondary | ICD-10-CM | POA: Diagnosis not present

## 2014-12-28 DIAGNOSIS — M4800 Spinal stenosis, site unspecified: Secondary | ICD-10-CM | POA: Diagnosis not present

## 2014-12-28 DIAGNOSIS — N289 Disorder of kidney and ureter, unspecified: Secondary | ICD-10-CM | POA: Diagnosis not present

## 2014-12-28 DIAGNOSIS — M79642 Pain in left hand: Secondary | ICD-10-CM | POA: Diagnosis not present

## 2014-12-28 DIAGNOSIS — M545 Low back pain: Secondary | ICD-10-CM | POA: Insufficient documentation

## 2014-12-28 DIAGNOSIS — I10 Essential (primary) hypertension: Secondary | ICD-10-CM | POA: Insufficient documentation

## 2014-12-28 DIAGNOSIS — M47817 Spondylosis without myelopathy or radiculopathy, lumbosacral region: Secondary | ICD-10-CM

## 2014-12-28 DIAGNOSIS — M79672 Pain in left foot: Secondary | ICD-10-CM | POA: Diagnosis not present

## 2014-12-28 MED ORDER — MORPHINE SULFATE 15 MG PO TABS: 15.0000 mg | ORAL_TABLET | Freq: Three times a day (TID) | ORAL | Status: DC

## 2014-12-28 NOTE — Progress Notes (Signed)
Subjective:    Patient ID: Paige Arnold, female    DOB: 04/18/1941, 74 y.o.   MRN: [id number]  HPI: [name] is a 74 year old female who returns for follow up for chronic pain and medication refill. She says her pain is located in her left knee and lower back. She rates her pain 5. Her current exercise regime is walking. Mrs. Volner forgot her medication bottle, [place] reviewed, medication was dispensed on 11/30/2014.  Pain Inventory Average Pain 5 Pain Right Now 5 My pain is constant, sharp, burning, dull, stabbing, tingling and aching  In the last 24 hours, has pain interfered with the following? General activity 2 Relation with others 1 Enjoyment of life 1 What TIME of day is your pain at its worst? daytime, evening, night Sleep (in general) Poor  Pain is worse with: walking, bending, sitting, standing and some activites Pain improves with: rest, heat/ice and medication Relief from Meds: 4  Mobility walk with assistance use a cane use a walker ability to climb steps?  no do you drive?  no needs help with transfers  Function retired I need assistance with the following:  meal prep, household duties and shopping  Neuro/Psych weakness numbness tingling trouble walking dizziness depression anxiety  Prior Studies Any changes since last visit?  no  Physicians involved in your care Any changes since last visit?  no   Family History  Problem Relation Age of Onset  . Kidney disease Mother   . Heart disease Father   . Diabetes Brother   . Alcohol abuse Brother   . Depression Brother   . Sarcoidosis Brother   . ALS Brother   . Heart disease Brother    History   Social History  . Marital Status: Married    Spouse Name: N/A  . Number of Children: N/A  . Years of Education: N/A   Social History Main Topics  . Smoking status: Never Smoker   . Smokeless tobacco: Never Used  . Alcohol Use: No  . Drug Use: No  . Sexual Activity: Yes   Other Topics  Concern  . None   Social History Narrative   Past Surgical History  Procedure Laterality Date  . Tubal ligation    . Temporomandibular joint surgery    . Partial knee arthroplasty    . Cholecystectomy    . Intraocular lens insertion Right 10-21-13   Past Medical History  Diagnosis Date  . Thyroid disorder   . Kidney disease   . Arthritis   . Hypertension   . Diabetes mellitus    BP 122/57 mmHg  Pulse 60  Resp 14  SpO2 95%  Opioid Risk Score:   Fall Risk Score: Moderate Fall Risk (6-13 points) (Pt previously educated)`1  Depression screen PHQ 2/9  Depression screen PHQ 2/9 10/01/2014  Decreased Interest 1  Down, Depressed, Hopeless 2  PHQ - 2 Score 3  Altered sleeping 2  Tired, decreased energy 2  Change in appetite 1  Feeling bad or failure about yourself  0  Trouble concentrating 1  Moving slowly or fidgety/restless 1  Suicidal thoughts 0  PHQ-9 Score 10     Review of Systems  Constitutional:       Night sweats Weight gain   Gastrointestinal: Positive for abdominal pain and constipation.  Endocrine:       High blood sugar  Genitourinary: Positive for dysuria and difficulty urinating.  Musculoskeletal: Positive for gait problem.  Neurological: Positive for  dizziness, weakness and numbness.       Tingling  Psychiatric/Behavioral: Positive for dysphoric mood. The patient is nervous/anxious.   All other systems reviewed and are negative.      Objective:   Physical Exam  Constitutional: She is oriented to person, place, and time. She appears well-developed and well-nourished.  HENT:  Head: Normocephalic and atraumatic.  Neck: Normal range of motion. Neck supple.  Cardiovascular: Normal rate and regular rhythm.   Pulmonary/Chest: Effort normal and breath sounds normal.  Musculoskeletal:  Normal Muscle Bulk and Muscle Testing Reveals: Upper Extremities: Full ROM and Muscle Strength 5/5 Thoracic Paraspinal Tenderness: T-7- T-8 Lumbar Paraspinal  Tenderness: L-3- L-5 Lower Extremities: Full ROM and Muscle Strength 5/5 Left Lower extremity Flexion Produces pain into Patella and Popliteal Fossa Arises from chair with ease using straight cane for support Antalgic gait   Neurological: She is alert and oriented to person, place, and time.  Skin: Skin is warm and dry.  Psychiatric: She has a normal mood and affect.  Nursing note and vitals reviewed.         Assessment & Plan:  1. Spinal stenosis chronic low back pain radiating into the legs: Continue MSIR 15 mg take one tablet by mouth three times daily #90 . Second script given to accommodated scheduled appointment.  2. Diabetic Neuropathy: Continue Gabapentin.  3. Left Knee Pain: Continue Current Medication and exercise Regimen. ( RSD) Continue Lidoderm Patches. 4. Left Hip Fracture S/P hemiarthroplasty: Orthopedist [facility] at [facility]  20 minutes of face to face patient care time was spent during this visit. All questions were encouraged and answered.   F/U in 1 month

## 2014-12-29 ENCOUNTER — Other Ambulatory Visit: Payer: Self-pay | Admitting: *Deleted

## 2014-12-29 DIAGNOSIS — N184 Chronic kidney disease, stage 4 (severe): Secondary | ICD-10-CM

## 2014-12-29 DIAGNOSIS — Z01810 Encounter for preprocedural cardiovascular examination: Secondary | ICD-10-CM

## 2014-12-29 LAB — PMP ALCOHOL METABOLITE (ETG): Ethyl Glucuronide (EtG): NEGATIVE ng/mL

## 2015-01-01 LAB — OPIATES/OPIOIDS (LC/MS-MS)
CODEINE URINE: NEGATIVE ng/mL (ref <50)
HYDROCODONE: NEGATIVE ng/mL (ref <50)
HYDROMORPHONE: 171 ng/mL (ref <50)
Morphine Urine: 14666 ng/mL (ref <50)
Norhydrocodone, Ur: NEGATIVE ng/mL (ref <50)
Noroxycodone, Ur: NEGATIVE ng/mL (ref <50)
Oxycodone, ur: NEGATIVE ng/mL (ref <50)
Oxymorphone: NEGATIVE ng/mL (ref <50)

## 2015-01-04 LAB — PRESCRIPTION MONITORING PROFILE (SOLSTAS)
AMPHETAMINE/METH: NEGATIVE ng/mL
BARBITURATE SCREEN, URINE: NEGATIVE ng/mL
BENZODIAZEPINE SCREEN, URINE: NEGATIVE ng/mL
BUPRENORPHINE, URINE: NEGATIVE ng/mL
CANNABINOID SCRN UR: NEGATIVE ng/mL
CARISOPRODOL, URINE: NEGATIVE ng/mL
COCAINE METABOLITES: NEGATIVE ng/mL
Creatinine, Urine: 122.07 mg/dL (ref >20.0)
ECSTASY: NEGATIVE ng/mL
FENTANYL URINE: NEGATIVE ng/mL
MEPERIDINE UR: NEGATIVE ng/mL
Methadone Screen, Urine: NEGATIVE ng/mL
NITRITES URINE, INITIAL: NEGATIVE ug/mL
Oxycodone Screen, Ur: NEGATIVE ng/mL
Propoxyphene: NEGATIVE ng/mL
Tapentadol, urine: NEGATIVE ng/mL
Tramadol Scrn, Ur: NEGATIVE ng/mL
Zolpidem, Urine: NEGATIVE ng/mL
pH, Initial: 6 pH (ref 4.5–8.9)

## 2015-01-06 ENCOUNTER — Encounter: Payer: Self-pay | Admitting: Neurology

## 2015-01-06 ENCOUNTER — Ambulatory Visit (INDEPENDENT_AMBULATORY_CARE_PROVIDER_SITE_OTHER): Payer: Medicare Other | Admitting: Neurology

## 2015-01-06 VITALS — BP 126/70 | HR 66 | Resp 16 | Ht 65.0 in | Wt 208.0 lb

## 2015-01-06 DIAGNOSIS — G3184 Mild cognitive impairment, so stated: Secondary | ICD-10-CM | POA: Diagnosis not present

## 2015-01-06 DIAGNOSIS — E0842 Diabetes mellitus due to underlying condition with diabetic polyneuropathy: Secondary | ICD-10-CM | POA: Diagnosis not present

## 2015-01-06 DIAGNOSIS — F32A Depression, unspecified: Secondary | ICD-10-CM

## 2015-01-06 DIAGNOSIS — F329 Major depressive disorder, single episode, unspecified: Secondary | ICD-10-CM | POA: Diagnosis not present

## 2015-01-06 DIAGNOSIS — G250 Essential tremor: Secondary | ICD-10-CM

## 2015-01-06 DIAGNOSIS — R413 Other amnesia: Secondary | ICD-10-CM

## 2015-01-06 MED ORDER — DONEPEZIL HCL 10 MG PO TABS: 10.0000 mg | ORAL_TABLET | Freq: Every day | ORAL | Status: DC

## 2015-01-06 MED ORDER — GABAPENTIN 300 MG PO CAPS: ORAL_CAPSULE | ORAL | Status: DC

## 2015-01-06 NOTE — Progress Notes (Signed)
Note sent

## 2015-01-06 NOTE — Progress Notes (Signed)
[facility] Clinic Note - Initial Visit   Date: 01/06/2015   Paige Arnold MRN: [id number] DOB: 08-Nov-1940   Dear Dr. Jacqlyn Larsen, III:  Thank you for your kind referral of Paige Arnold for consultation of memory loss, neuropathy, and tremors. Although her history is well known to you, please allow [place] to reiterate it for the purpose of our medical record. The patient was accompanied to the clinic by self.   History of Present Illness: Paige Arnold is a 74 y.o. right-handed Caucasian female with hypertension, diabetes mellitus, depression/anxiety, hypothyroidism, and chronic pain presenting for evaluation of memory loss, neuropathy, and tremors.  She was previously seen Dr. Everette Rank and is transferring care here.    She reports having neuropathy for the past two years, described throbbing.  She does not burning or tingling, but says that she cannot feel her feet.  The numbness extends to where her ankles are.  She endorses problems with balance and has been using a cane since her knee surgery in 2001.  She takes gabapentin 400mg  in the morning and 800mg  at bedtime.  She reports having previous NCS/EMG of her hands and feet.  She also complains of memory problems for a number of years.  She does not drive or pay her own bills.  She does not cook, clean, or have any hobbies.  Her day is spent watching TV and sleeping. She does not exercise.  She takes aricept 10mg  daily. She feels that her memory is slowly worsening.  She does not keep up with the date or appointments.  She forgets peoples names.  She does not repeat herself.  She cannot multitask. She says that neuropsychological testing as well as imaging has been performed, but I do not have these records.  Starting in 2015, she developed tremors which as noticeable when she holds objects.  She feels like her whole body has an internal tremor, but does not actually see it.    She endorses feeling of anxiety and  depressed mood.  She has seen psychiatry and counsellors in the past, but they didn't "click" with her so she stopped seeing them.  She says "I want someone to listen to me" and began crying.  No SI/HI.  Of note, she has RSD and lumbar stenosis and is on morphine 15mg  qh6 prn pain, followed by Dr. Letta Pate.  Out-side paper records, electronic medical record, and images have been reviewed where available and summarized as:  Lab Results  Component Value Date   TSH 0.75 11/18/2014   Lab Results  Component Value Date   HGBA1C 6.3* 03/09/2014    MRI lumbar spine wo contrast 10/09/2012: Chronic compression fracture L4, new since 2011.  No acute fracture. Mild spinal stenosis L2-3. Progressive spinal stenosis at L3-4 which is now moderately severe. This is most prominent on the left.  There is progressive spurring at this level related to the chronic fracture.  The left lateral disc protrusion L3-4 is unchanged.  Past Medical History  Diagnosis Date  . Thyroid disorder   . Kidney disease   . Arthritis   . Hypertension   . Diabetes mellitus     Past Surgical History  Procedure Laterality Date  . Tubal ligation    . Temporomandibular joint surgery    . Partial knee arthroplasty      left  . Cholecystectomy    . Intraocular lens insertion Right 10-21-13     Medications:  Current Outpatient Prescriptions on  File Prior to Visit  Medication Sig Dispense Refill  . amLODipine (NORVASC) 10 MG tablet Take 10 mg by mouth daily.    Marland Kitchen aspirin 81 MG tablet Take 81 mg by mouth daily.    . cloNIDine (CATAPRES) 0.2 MG tablet Take 0.2 mg by mouth daily.     . divalproex (DEPAKOTE ER) 250 MG 24 hr tablet 2 every morning    . DULoxetine (CYMBALTA) 30 MG capsule Take 30 mg by mouth daily.    . furosemide (LASIX) 40 MG tablet Take 1 tablet (40 mg total) by mouth 2 (two) times daily. (Patient taking differently: Take 40 mg by mouth daily. ) 60 tablet 0  . levothyroxine (SYNTHROID) 150 MCG tablet Take  1 tablet (150 mcg total) by mouth daily before breakfast. 30 tablet 11  . lidocaine (LIDODERM) 5 % Place 1 patch onto the skin every 12 (twelve) hours. Remove & Discard patch within 12 hours or as directed 30 patch 4  . morphine (MSIR) 15 MG tablet Take 1 tablet (15 mg total) by mouth 3 (three) times daily. 90 tablet 0  . Multiple Vitamin (MULTIVITAMIN WITH MINERALS) TABS tablet Take 1 tablet by mouth daily.    . pravastatin (PRAVACHOL) 40 MG tablet Take 40 mg by mouth daily.    . sitaGLIPtin (JANUVIA) 100 MG tablet Take 50 mg by mouth daily.     . phenazopyridine (PYRIDIUM) 100 MG tablet Take 100 mg by mouth as needed for pain.     No current facility-administered medications on file prior to visit.    Allergies:  Allergies  Allergen Reactions  . Sulfa Antibiotics Itching    Family History: Family History  Problem Relation Age of Onset  . Kidney disease Mother   . Heart disease Father   . Diabetes Brother   . Alcohol abuse Brother   . Depression Brother   . Sarcoidosis Brother   . ALS Brother     Deceased, 39s  . Heart disease Brother   . Bipolar disorder Daughter     Social History: History   Social History  . Marital Status: Married    Spouse Name: N/A  . Number of Children: 3  . Years of Education: N/A   Occupational History  . retired    Social History Main Topics  . Smoking status: Never Smoker   . Smokeless tobacco: Never Used  . Alcohol Use: No  . Drug Use: No  . Sexual Activity: Yes   Other Topics Concern  . Not on file   Social History Narrative   She lives with husband and daughter.  Three grown children.   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     She took early retired at 44 because of problems of RSD.   Highest level of education:  Graduated high school       Review of Systems:  CONSTITUTIONAL: No fevers, chills, night sweats, or weight loss.   EYES: No visual changes or eye pain ENT: No hearing changes.  No  history of nose bleeds.   RESPIRATORY: No cough, wheezing and shortness of breath.   CARDIOVASCULAR: Negative for chest pain, and palpitations.   GI: Negative for abdominal discomfort, blood in stools or black stools.  No recent change in bowel habits.   GU:  No history of incontinence.   MUSCLOSKELETAL: +history of joint pain or swelling.  No myalgias.   SKIN: Negative for lesions, rash, and itching.   HEMATOLOGY/ONCOLOGY: Negative for prolonged bleeding,  bruising easily, and swollen nodes.  No history of cancer.   ENDOCRINE: Negative for cold or heat intolerance, polydipsia or goiter.   PSYCH:  ++depression or anxiety symptoms.   NEURO: As Above.   Vital Signs:  BP 126/70 mmHg  Pulse 66  Resp 16  Ht 5\' 5"  (1.651 m)  Wt 208 lb (94.348 kg)  BMI 34.61 kg/m2  SpO2 98%   General Medical Exam:   General:  Well appearing, comfortable.   Eyes/ENT: see cranial nerve examination.   Neck: No masses appreciated.  Full range of motion without tenderness.  No carotid bruits. Respiratory:  Clear to auscultation, good air entry bilaterally.   Cardiac:  Regular rate and rhythm, no murmur.   Extremities:  No deformities, edema, or skin discoloration.  Skin:  No rashes or lesions.  Neurological Exam: MENTAL STATUS including orientation to time, place, person, recent and remote memory, attention span and concentration, language, and fund of knowledge is fair. Speech is not dysarthric.  She is tearful at times.   Montreal Cognitive Assessment  01/06/2015  Visuospatial/ Executive (0/5) 5  Naming (0/3) 2  Attention: Read list of digits (0/2) 2  Attention: Read list of letters (0/1) 1  Attention: Serial 7 subtraction starting at 100 (0/3) 2  Language: Repeat phrase (0/2) 2  Language : Fluency (0/1) 0  Abstraction (0/2) 1  Delayed Recall (0/5) 0  Orientation (0/6) 5  Total 20  Adjusted Score (based on education) 21   CRANIAL NERVES: II:  No visual field defects.  Unremarkable fundi.     III-IV-VI: Pupils equal round and reactive to light.  Normal conjugate, extra-ocular eye movements in all directions of gaze.  No nystagmus.  No ptosis.   V:  Normal facial sensation.  Jaw jerk is absent.   VII:  Normal facial symmetry and movements.  No pathologic facial reflexes.  VIII:  Normal hearing and vestibular function.   IX-X:  Normal palatal movement.   XI:  Normal shoulder shrug and head rotation.   XII:  Normal tongue strength and range of motion, no deviation or fasciculation.  MOTOR:  No atrophy, fasciculations or abnormal movements.  No pronator drift.  Tone is normal.    Right Upper Extremity:    Left Upper Extremity:    Deltoid  5/5   Deltoid  5/5   Biceps  5/5   Biceps  5/5   Triceps  5/5   Triceps  5/5   Wrist extensors  5/5   Wrist extensors  5/5   Wrist flexors  5/5   Wrist flexors  5/5   Finger extensors  5/5   Finger extensors  5/5   Finger flexors  5/5   Finger flexors  5/5   Dorsal interossei  5/5   Dorsal interossei  5/5   Abductor pollicis  5/5   Abductor pollicis  5/5   Tone (Ashworth scale)  0  Tone (Ashworth scale)  0   Right Lower Extremity:    Left Lower Extremity:    Hip flexors  5/5   Hip flexors  5/5   Hip extensors  5/5   Hip extensors  5/5   Knee flexors  5/5   Knee flexors  5/5   Knee extensors  5/5   Knee extensors  5/5   Dorsiflexors  5/5   Dorsiflexors  5/5   Plantarflexors  5/5   Plantarflexors  5/5   Toe extensors  5/5   Toe extensors  5/5  Toe flexors  5/5   Toe flexors  5/5   Tone (Ashworth scale)  0  Tone (Ashworth scale)  0   MSRs:  Right                                                                 Left brachioradialis 2+  brachioradialis 2+  biceps 2+  biceps 2+  triceps 2+  triceps 2+  patellar 2+  patellar 2+  ankle jerk 0  ankle jerk 0  Hoffman no  Hoffman no  plantar response down  plantar response down   SENSORY:  Reduced vibration, temperature, and pin prick distal to ankles bilaterally.  Romberg's sign  positive  COORDINATION/GAIT: Normal finger-to- nose-finger.  Intact rapid alternating movements bilaterally. Slow, antalgic gait, assisted with cane.    IMPRESSION/PLAN: 1.  Diabetic peripheral neuropathy manifesting with predominately numbness.  Praised her for having diabetes under good control.  Since she has primary numbness, I would like to taper her gabapentin to 300mg  in the morning and 600mg  at bedtime and see if we can get her on less medication as this is not helpful in numbness. New prescription for this was sent. If he notices worsening or new burning paresthesias, ok to increase.  2.  Benign essential tremors.  Medical management discussed and given that symptoms are mild, it was decided not to start on any medications at this time  3.  Cognitive impairment, multifactorial (depression, medications, dementia syndrome).  I am curious to see what her neuropsychological testing revealed and have requested these results.  Refills sent for Aricept 10mg  daily.  She was encouraged to stay active, discover new interests, and engage in mentally stimulating activities   4.  Depression/anxiety.  She endorses feeling of depression, anhedonia, and has previously seen psychiatry/psychology.  It is very important to her general health for her to seek the appropriate resources to manage her multiple medical comorbidities. I am not sure why she takes depakote or cymbalta.  Return to clinic in 6 months   The duration of this appointment visit was 60 minutes of face-to-face time with the patient.  Greater than 50% of this time was spent in counseling, explanation of diagnosis, planning of further management, and coordination of care.   Thank you for allowing me to participate in patient's care.  If I can answer any additional questions, I would be pleased to do so.    Sincerely,    Donika K. Posey Pronto, DO

## 2015-01-06 NOTE — Patient Instructions (Addendum)
1.  Refills have been sent for gabapentin and aricept 2.  Referral to behavior therapy 3.  Encouraged to stay active, discover new interests, and engage in mentally stimulating activities 4.  Return to clinic in 6 months, or sooner as needed

## 2015-01-10 ENCOUNTER — Other Ambulatory Visit: Payer: Self-pay

## 2015-01-12 NOTE — Progress Notes (Signed)
Urine drug screen for this encounter is consistent for prescribed medication 

## 2015-01-28 ENCOUNTER — Ambulatory Visit: Payer: Self-pay | Admitting: Neurology

## 2015-02-03 ENCOUNTER — Encounter: Payer: Worker's Compensation | Attending: Registered Nurse | Admitting: Registered Nurse

## 2015-02-03 ENCOUNTER — Encounter: Payer: Self-pay | Admitting: Registered Nurse

## 2015-02-03 VITALS — BP 123/68 | HR 64 | Resp 16

## 2015-02-03 DIAGNOSIS — M47817 Spondylosis without myelopathy or radiculopathy, lumbosacral region: Secondary | ICD-10-CM

## 2015-02-03 DIAGNOSIS — M79642 Pain in left hand: Secondary | ICD-10-CM | POA: Diagnosis not present

## 2015-02-03 DIAGNOSIS — E1140 Type 2 diabetes mellitus with diabetic neuropathy, unspecified: Secondary | ICD-10-CM | POA: Diagnosis not present

## 2015-02-03 DIAGNOSIS — Z760 Encounter for issue of repeat prescription: Secondary | ICD-10-CM | POA: Diagnosis present

## 2015-02-03 DIAGNOSIS — M79672 Pain in left foot: Secondary | ICD-10-CM | POA: Diagnosis not present

## 2015-02-03 DIAGNOSIS — M7062 Trochanteric bursitis, left hip: Secondary | ICD-10-CM | POA: Diagnosis not present

## 2015-02-03 DIAGNOSIS — M79641 Pain in right hand: Secondary | ICD-10-CM | POA: Diagnosis not present

## 2015-02-03 DIAGNOSIS — M4800 Spinal stenosis, site unspecified: Secondary | ICD-10-CM | POA: Diagnosis not present

## 2015-02-03 DIAGNOSIS — N289 Disorder of kidney and ureter, unspecified: Secondary | ICD-10-CM | POA: Insufficient documentation

## 2015-02-03 DIAGNOSIS — Z79899 Other long term (current) drug therapy: Secondary | ICD-10-CM

## 2015-02-03 DIAGNOSIS — Z5181 Encounter for therapeutic drug level monitoring: Secondary | ICD-10-CM

## 2015-02-03 DIAGNOSIS — G8929 Other chronic pain: Secondary | ICD-10-CM | POA: Insufficient documentation

## 2015-02-03 DIAGNOSIS — M25562 Pain in left knee: Secondary | ICD-10-CM | POA: Diagnosis not present

## 2015-02-03 DIAGNOSIS — M79671 Pain in right foot: Secondary | ICD-10-CM | POA: Diagnosis not present

## 2015-02-03 DIAGNOSIS — M1990 Unspecified osteoarthritis, unspecified site: Secondary | ICD-10-CM | POA: Insufficient documentation

## 2015-02-03 DIAGNOSIS — E079 Disorder of thyroid, unspecified: Secondary | ICD-10-CM | POA: Insufficient documentation

## 2015-02-03 DIAGNOSIS — G894 Chronic pain syndrome: Secondary | ICD-10-CM | POA: Diagnosis not present

## 2015-02-03 DIAGNOSIS — I10 Essential (primary) hypertension: Secondary | ICD-10-CM | POA: Diagnosis not present

## 2015-02-03 DIAGNOSIS — M545 Low back pain: Secondary | ICD-10-CM | POA: Diagnosis not present

## 2015-02-03 MED ORDER — MORPHINE SULFATE 15 MG PO TABS: 15.0000 mg | ORAL_TABLET | Freq: Three times a day (TID) | ORAL | Status: DC

## 2015-02-03 NOTE — Progress Notes (Signed)
Subjective:    Patient ID: Paige Arnold, female    DOB: 06/10/1941, 74 y.o.   MRN: [id number]  HPI: Paige Arnold is a 74 year old female who returns for follow up for chronic pain and medication refill. She says her pain is located in her left hip, left knee and lower back. She rates her pain 5. Her current exercise regime is walking.  Pain Inventory Average Pain 4 Pain Right Now 5 My pain is sharp, burning, dull, stabbing, tingling and aching  In the last 24 hours, has pain interfered with the following? General activity 1 Relation with others 1 Enjoyment of life 1 What TIME of day is your pain at its worst? all Sleep (in general) Fair  Pain is worse with: walking, bending, standing and some activites Pain improves with: rest and heat/ice Relief from Meds: 5  Mobility use a cane  Function disabled: date disabled . retired I need assistance with the following:  meal prep, household duties and shopping  Neuro/Psych trouble walking spasms depression anxiety  Prior Studies Any changes since last visit?  no  Physicians involved in your care Any changes since last visit?  no   Family History  Problem Relation Age of Onset  . Kidney disease Mother   . Heart disease Father   . Diabetes Brother   . Alcohol abuse Brother   . Depression Brother   . Sarcoidosis Brother   . ALS Brother     Deceased, 26s  . Heart disease Brother   . Bipolar disorder Daughter    History   Social History  . Marital Status: Married    Spouse Name: N/A  . Number of Children: 3  . Years of Education: N/A   Occupational History  . retired    Social History Main Topics  . Smoking status: Never Smoker   . Smokeless tobacco: Never Used  . Alcohol Use: No  . Drug Use: No  . Sexual Activity: Yes   Other Topics Concern  . None   Social History Narrative   She lives with husband and daughter.  Three grown children.   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     She took early retired at 77 because of problems of RSD.   Highest level of education:  Graduated high school      Past Surgical History  Procedure Laterality Date  . Tubal ligation    . Temporomandibular joint surgery    . Partial knee arthroplasty      left  . Cholecystectomy    . Intraocular lens insertion Right 10-21-13   Past Medical History  Diagnosis Date  . Thyroid disorder   . Kidney disease   . Arthritis   . Hypertension   . Diabetes mellitus    BP 123/68 mmHg  Pulse 64  Resp 16  SpO2 96%  Opioid Risk Score:   Fall Risk Score:  `1  Depression screen PHQ 2/9  Depression screen [facility] 2/9 02/03/2015 10/01/2014  Decreased Interest 1 1  Down, Depressed, Hopeless 2 2  PHQ - 2 Score 3 3  Altered sleeping 3 2  Tired, decreased energy 2 2  Change in appetite 2 1  Feeling bad or failure about yourself  1 0  Trouble concentrating 0 1  Moving slowly or fidgety/restless 1 1  Suicidal thoughts 0 0  PHQ-9 Score 12 10     Review of Systems  Constitutional: Positive for diaphoresis.  Cardiovascular: Positive for leg swelling.  Gastrointestinal: Positive for abdominal pain and constipation.  Genitourinary: Positive for dysuria and difficulty urinating.  Musculoskeletal: Positive for gait problem.  Neurological: Positive for dizziness.  Psychiatric/Behavioral: Positive for dysphoric mood. The patient is nervous/anxious.   All other systems reviewed and are negative.      Objective:   Physical Exam  Constitutional: She is oriented to person, place, and time. She appears well-developed and well-nourished.  HENT:  Head: Normocephalic and atraumatic.  Neck: Normal range of motion. Neck supple.  Cardiovascular: Normal rate and regular rhythm.   Pulmonary/Chest: Effort normal and breath sounds normal.  Musculoskeletal:  Normal Muscle Bulk and Muscle Testing Reveals: Upper Extremities: Full ROM and Muscle Strength 5/5 Lumbar Paraspinal Tenderness: L-4-  L-5 Lower Extremities: Full ROM and Muscle Strength 5/5 Arises from chair with ease/ Using straight cane  Narrow Based Gait   Neurological: She is alert and oriented to person, place, and time.  Skin: Skin is warm and dry.  Psychiatric: She has a normal mood and affect.  Nursing note and vitals reviewed.         Assessment & Plan:  1. Spinal stenosis chronic low back pain radiating into the legs: Continue MSIR 15 mg take one tablet by mouth three times daily #90. 2. Diabetic Neuropathy: Continue Gabapentin.  3. Left Knee Pain: Continue Current Medication and exercise Regimen. ( RSD) Continue Lidoderm Patches. 4. Left Hip Fracture S/P hemiarthroplasty: Orthopedist [facility] at [facility]  20 minutes of face to face patient care time was spent during this visit. All questions were encouraged and answered.   F/U in 1 month

## 2015-02-10 ENCOUNTER — Encounter: Payer: Self-pay | Admitting: Vascular Surgery

## 2015-02-15 ENCOUNTER — Other Ambulatory Visit: Payer: Self-pay

## 2015-02-15 ENCOUNTER — Ambulatory Visit (INDEPENDENT_AMBULATORY_CARE_PROVIDER_SITE_OTHER): Payer: Medicare Other | Admitting: Vascular Surgery

## 2015-02-15 ENCOUNTER — Ambulatory Visit (HOSPITAL_COMMUNITY)
Admission: RE | Admit: 2015-02-15 | Discharge: 2015-02-15 | Disposition: A | Discharge status: Home / Self Care | Payer: Medicare Other | Source: Ambulatory Visit | Attending: Vascular Surgery | Admitting: Vascular Surgery

## 2015-02-15 ENCOUNTER — Encounter (HOSPITAL_COMMUNITY): Payer: Self-pay | Admitting: *Deleted

## 2015-02-15 ENCOUNTER — Encounter: Payer: Self-pay | Admitting: Vascular Surgery

## 2015-02-15 VITALS — BP 130/76 | HR 66 | Temp 97.6°F | Resp 16 | Ht 65.0 in | Wt 208.0 lb

## 2015-02-15 DIAGNOSIS — Z01810 Encounter for preprocedural cardiovascular examination: Secondary | ICD-10-CM

## 2015-02-15 DIAGNOSIS — N185 Chronic kidney disease, stage 5: Secondary | ICD-10-CM

## 2015-02-15 DIAGNOSIS — N184 Chronic kidney disease, stage 4 (severe): Secondary | ICD-10-CM | POA: Diagnosis not present

## 2015-02-15 NOTE — Progress Notes (Addendum)
Pt denies chest pain and being under the care of a cardiologist but stated, " I get SOB when I first lay down, then it gets better." Pt stated that she had a stress test done 7-8 years ago but cannot remember the location or the ordering physicians name. Pt denies having a cardiac cath and echo. Pt made aware to NOT take diabetic medication the morning of procedure ( sitaGLIPtin (JANUVIA). Pt made aware to stop otc vitamins, herbal medications and NSAIDs. Spoke with Willeen Cass, NP,  regarding pt history, EKG, C/O SOB. Pt verbalized understanding of all pre-op instructions.

## 2015-02-15 NOTE — Progress Notes (Signed)
Subjective:     Patient ID: Paige Arnold, female   DOB: [birth year], 74 y.o.   MRN: [id number]  HPI this 74 year old female was referred by Dr. Jamal Maes for evaluation for vascular access. Patient has been followed by Dr. Lu Duffel for about 10 years. She has a history of hypertension and she thinks that caused her kidney failure. She has never been on hemodialysis. She is right-handed. She denies pain or numbness in either hand.  Past Medical History  Diagnosis Date  . Thyroid disorder   . Kidney disease   . Arthritis   . Hypertension   . Diabetes mellitus     History  Substance Use Topics  . Smoking status: Never Smoker   . Smokeless tobacco: Never Used  . Alcohol Use: No    Family History  Problem Relation Age of Onset  . Kidney disease Mother   . Heart disease Father   . Diabetes Brother   . Alcohol abuse Brother   . Depression Brother   . Sarcoidosis Brother   . ALS Brother     Deceased, 73s  . Heart disease Brother   . Bipolar disorder Daughter     Allergies  Allergen Reactions  . Sulfa Antibiotics Itching     Current outpatient prescriptions:  .  amLODipine (NORVASC) 10 MG tablet, Take 10 mg by mouth daily., Disp: , Rfl:  .  aspirin 81 MG tablet, Take 81 mg by mouth daily., Disp: , Rfl:  .  cloNIDine (CATAPRES) 0.2 MG tablet, Take 0.2 mg by mouth daily. , Disp: , Rfl:  .  dicyclomine (BENTYL) 10 MG capsule, Take 10 mg by mouth daily., Disp: , Rfl:  .  divalproex (DEPAKOTE ER) 250 MG 24 hr tablet, 2 every morning, Disp: , Rfl:  .  donepezil (ARICEPT) 10 MG tablet, Take 1 tablet (10 mg total) by mouth at bedtime. 1 q hs, Disp: 30 tablet, Rfl: 11 .  DULoxetine (CYMBALTA) 30 MG capsule, Take 30 mg by mouth daily., Disp: , Rfl:  .  furosemide (LASIX) 40 MG tablet, Take 1 tablet (40 mg total) by mouth 2 (two) times daily. (Patient taking differently: Take 40 mg by mouth daily. ), Disp: 60 tablet, Rfl: 0 .  gabapentin (NEURONTIN) 300 MG capsule, Take 1 tablet in the  morning and 2 tablets., Disp: 90 capsule, Rfl: 11 .  gabapentin (NEURONTIN) 400 MG capsule, TAKE 1 CAP IN THE MORNING, AND 2 CAPS AT BEDTIME., Disp: , Rfl: 2 .  levothyroxine (SYNTHROID) 150 MCG tablet, Take 1 tablet (150 mcg total) by mouth daily before breakfast., Disp: 30 tablet, Rfl: 11 .  lidocaine (LIDODERM) 5 %, Place 1 patch onto the skin every 12 (twelve) hours. Remove & Discard patch within 12 hours or as directed, Disp: 30 patch, Rfl: 4 .  morphine (MSIR) 15 MG tablet, Take 1 tablet (15 mg total) by mouth 3 (three) times daily., Disp: 90 tablet, Rfl: 0 .  Multiple Vitamin (MULTIVITAMIN WITH MINERALS) TABS tablet, Take 1 tablet by mouth daily., Disp: , Rfl:  .  phenazopyridine (PYRIDIUM) 100 MG tablet, Take 100 mg by mouth as needed for pain., Disp: , Rfl:  .  pravastatin (PRAVACHOL) 40 MG tablet, Take 40 mg by mouth daily., Disp: , Rfl:  .  propranolol (INDERAL) 20 MG tablet, Take 20 mg by mouth 2 (two) times daily., Disp: , Rfl: 3 .  sitaGLIPtin (JANUVIA) 100 MG tablet, Take 50 mg by mouth daily. , Disp: , Rfl:   Filed  Vitals:   02/15/15 0940  BP: 130/76  Pulse: 66  Temp: 97.6 F (36.4 C)  TempSrc: Oral  Resp: 16  Height: 5\' 5"  (1.651 m)  Weight: 208 lb (94.348 kg)  SpO2: 98%    Body mass index is 34.61 kg/(m^2).           Review of Systems denies chest pain, dyspnea on exertion, PND, orthopnea. Has had some increasing peripheral edema recently. Has long history of severe left knee pain dating back to 1999 when she had partial knee replacement and has had pain since that time. As history of diffuse arthritis. Has positive history of diabetes mellitus type 2. Other systems negative and a complete review of systems     Objective:   Physical Exam BP 130/76 mmHg  Pulse 66  Temp(Src) 97.6 F (36.4 C) (Oral)  Resp 16  Ht 5\' 5"  (1.651 m)  Wt 208 lb (94.348 kg)  BMI 34.61 kg/m2  SpO2 98%  Gen.-alert and oriented x3 in no apparent distress HEENT normal for  age Lungs no rhonchi or wheezing Cardiovascular regular rhythm no murmurs carotid pulses 3+ palpable no bruits audible Abdomen soft nontender no palpable masses Musculoskeletal free of  major deformities Skin clear -no rashes Neurologic normal Lower extremities 3+ femoral and dorsalis pedis pulses palpable bilaterally with 1+ edema.  Today I ordered bilateral upper extremity vein mapping as well as arterial exam with duplex. Arterial system is normal with triphasic flow bilaterally on the left and decreased flow in the ulnar artery on the right. Cephalic vein in the left arm appears adequate for AV fistula creation with forearm being borderline upper arm being excellent.       Assessment:     Stage V chronic kidney disease needs vascular access History left partial knee replacement with chronic postoperative pain Diabetes mellitus type 2 Hypertension    Plan:     We'll plan left arm AV fistula tomorrow either radial cephalic or brachial cephalic. Discussed with patient the fact that fistula may not mature to satisfactory conduit may need revision or creation at another level. She understands this. Surgery scheduled for tomorrow

## 2015-02-16 ENCOUNTER — Ambulatory Visit (HOSPITAL_COMMUNITY)
Admission: RE | Admit: 2015-02-16 | Discharge: 2015-02-16 | Disposition: A | Discharge status: Home / Self Care | Payer: Medicare Other | Source: Ambulatory Visit | Attending: Vascular Surgery | Admitting: Vascular Surgery

## 2015-02-16 ENCOUNTER — Ambulatory Visit (HOSPITAL_COMMUNITY): Payer: Medicare Other | Admitting: Emergency Medicine

## 2015-02-16 ENCOUNTER — Encounter (HOSPITAL_COMMUNITY)
Admission: RE | Disposition: A | Discharge status: Home / Self Care | Payer: Self-pay | Source: Ambulatory Visit | Attending: Vascular Surgery

## 2015-02-16 ENCOUNTER — Other Ambulatory Visit: Payer: Self-pay

## 2015-02-16 ENCOUNTER — Encounter (HOSPITAL_COMMUNITY): Payer: Self-pay | Admitting: *Deleted

## 2015-02-16 DIAGNOSIS — N185 Chronic kidney disease, stage 5: Secondary | ICD-10-CM | POA: Insufficient documentation

## 2015-02-16 DIAGNOSIS — E119 Type 2 diabetes mellitus without complications: Secondary | ICD-10-CM | POA: Diagnosis not present

## 2015-02-16 DIAGNOSIS — Z48812 Encounter for surgical aftercare following surgery on the circulatory system: Secondary | ICD-10-CM

## 2015-02-16 DIAGNOSIS — I129 Hypertensive chronic kidney disease with stage 1 through stage 4 chronic kidney disease, or unspecified chronic kidney disease: Secondary | ICD-10-CM | POA: Diagnosis not present

## 2015-02-16 DIAGNOSIS — N186 End stage renal disease: Secondary | ICD-10-CM

## 2015-02-16 HISTORY — DX: Gastro-esophageal reflux disease without esophagitis: K21.9

## 2015-02-16 HISTORY — PX: AV FISTULA PLACEMENT: SHX1204

## 2015-02-16 HISTORY — DX: Type 2 diabetes mellitus with diabetic neuropathy, unspecified: E11.40

## 2015-02-16 HISTORY — PX: LIGATION OF COMPETING BRANCHES OF ARTERIOVENOUS FISTULA: SHX5949

## 2015-02-16 HISTORY — DX: Reserved for inherently not codable concepts without codable children: IMO0001

## 2015-02-16 HISTORY — DX: Sleep apnea, unspecified: G47.30

## 2015-02-16 LAB — POCT I-STAT 4, (NA,K, GLUC, HGB,HCT)
Glucose, Bld: 141 mg/dL — ABNORMAL HIGH (ref 65–99)
HCT: 37 % (ref 36.0–46.0)
Hemoglobin: 12.6 g/dL (ref 12.0–15.0)
Potassium: 4.4 mmol/L (ref 3.5–5.1)
Sodium: 141 mmol/L (ref 135–145)

## 2015-02-16 LAB — GLUCOSE, CAPILLARY
Glucose-Capillary: 139 mg/dL — ABNORMAL HIGH (ref 65–99)
Glucose-Capillary: 111 mg/dL — ABNORMAL HIGH (ref 65–99)
Glucose-Capillary: 115 mg/dL — ABNORMAL HIGH (ref 65–99)

## 2015-02-16 SURGERY — ARTERIOVENOUS (AV) FISTULA CREATION
Anesthesia: Monitor Anesthesia Care | Site: Arm Upper | Laterality: Left

## 2015-02-16 MED ORDER — MIDAZOLAM HCL 5 MG/5ML IJ SOLN
INTRAMUSCULAR | Status: DC | PRN
  Administered 2015-02-16 (×2): 1 mg via INTRAVENOUS

## 2015-02-16 MED ORDER — ONDANSETRON HCL 4 MG/2ML IJ SOLN: 4.0000 mg | Freq: Once | INTRAMUSCULAR | Status: DC | PRN

## 2015-02-16 MED ORDER — OXYCODONE HCL 5 MG PO TABS
ORAL_TABLET | ORAL | Status: AC
  Filled 2015-02-16: qty 1

## 2015-02-16 MED ORDER — PROPOFOL INFUSION 10 MG/ML OPTIME
INTRAVENOUS | Status: DC | PRN
  Administered 2015-02-16: 50 ug/kg/min via INTRAVENOUS

## 2015-02-16 MED ORDER — DEXTROSE 5 % IV SOLN
1.5000 g | INTRAVENOUS | Status: AC
  Administered 2015-02-16: 1.5 g via INTRAVENOUS
  Filled 2015-02-16: qty 1.5

## 2015-02-16 MED ORDER — CHLORHEXIDINE GLUCONATE CLOTH 2 % EX PADS: 6.0000 | MEDICATED_PAD | Freq: Once | CUTANEOUS | Status: DC

## 2015-02-16 MED ORDER — LIDOCAINE HCL (CARDIAC) 20 MG/ML IV SOLN
INTRAVENOUS | Status: DC | PRN
  Administered 2015-02-16: 50 mg via INTRAVENOUS

## 2015-02-16 MED ORDER — MIDAZOLAM HCL 2 MG/2ML IJ SOLN
INTRAMUSCULAR | Status: AC
  Filled 2015-02-16: qty 4

## 2015-02-16 MED ORDER — FENTANYL CITRATE (PF) 100 MCG/2ML IJ SOLN
INTRAMUSCULAR | Status: DC | PRN
  Administered 2015-02-16 (×2): 50 ug via INTRAVENOUS

## 2015-02-16 MED ORDER — 0.9 % SODIUM CHLORIDE (POUR BTL) OPTIME
TOPICAL | Status: DC | PRN
  Administered 2015-02-16: 1000 mL

## 2015-02-16 MED ORDER — OXYCODONE HCL 5 MG PO TABS
5.0000 mg | ORAL_TABLET | Freq: Once | ORAL | Status: AC | PRN
  Administered 2015-02-16: 5 mg via ORAL

## 2015-02-16 MED ORDER — OXYCODONE HCL 5 MG PO TABS: 5.0000 mg | ORAL_TABLET | Freq: Four times a day (QID) | ORAL | Status: DC | PRN

## 2015-02-16 MED ORDER — EPHEDRINE SULFATE 50 MG/ML IJ SOLN
INTRAMUSCULAR | Status: DC | PRN
  Administered 2015-02-16: 5 mg via INTRAVENOUS

## 2015-02-16 MED ORDER — SODIUM CHLORIDE 0.9 % IR SOLN
Status: DC | PRN
  Administered 2015-02-16: 500 mL

## 2015-02-16 MED ORDER — LIDOCAINE HCL (CARDIAC) 20 MG/ML IV SOLN
INTRAVENOUS | Status: AC
  Filled 2015-02-16: qty 5

## 2015-02-16 MED ORDER — SODIUM CHLORIDE 0.9 % IV SOLN
INTRAVENOUS | Status: DC
  Administered 2015-02-16: 08:00:00 via INTRAVENOUS

## 2015-02-16 MED ORDER — FENTANYL CITRATE (PF) 100 MCG/2ML IJ SOLN: 25.0000 ug | INTRAMUSCULAR | Status: DC | PRN

## 2015-02-16 MED ORDER — OXYCODONE HCL 5 MG/5ML PO SOLN: 5.0000 mg | Freq: Once | ORAL | Status: AC | PRN

## 2015-02-16 MED ORDER — LIDOCAINE-EPINEPHRINE (PF) 1 %-1:200000 IJ SOLN
INTRAMUSCULAR | Status: DC | PRN
  Administered 2015-02-16: 14 mL

## 2015-02-16 MED ORDER — FENTANYL CITRATE (PF) 250 MCG/5ML IJ SOLN
INTRAMUSCULAR | Status: AC
  Filled 2015-02-16: qty 5

## 2015-02-16 SURGICAL SUPPLY — 34 items
ARMBAND PINK RESTRICT EXTREMIT (MISCELLANEOUS) ×3 IMPLANT
CANISTER SUCTION 2500CC (MISCELLANEOUS) ×3 IMPLANT
CLIP TI MEDIUM 6 (CLIP) ×3 IMPLANT
CLIP TI WIDE RED SMALL 6 (CLIP) ×5 IMPLANT
COVER PROBE W GEL 5X96 (DRAPES) ×2 IMPLANT
DRAIN PENROSE 1/4X12 LTX STRL (WOUND CARE) ×3 IMPLANT
ELECT REM PT RETURN 9FT ADLT (ELECTROSURGICAL) ×3
ELECTRODE REM PT RTRN 9FT ADLT (ELECTROSURGICAL) ×1 IMPLANT
GEL ULTRASOUND 20GR AQUASONIC (MISCELLANEOUS) IMPLANT
GLOVE BIO SURGEON STRL SZ 6.5 (GLOVE) ×1 IMPLANT
GLOVE BIO SURGEONS STRL SZ 6.5 (GLOVE) ×1
GLOVE BIOGEL PI IND STRL 6.5 (GLOVE) IMPLANT
GLOVE BIOGEL PI IND STRL 7.0 (GLOVE) IMPLANT
GLOVE BIOGEL PI INDICATOR 6.5 (GLOVE) ×2
GLOVE BIOGEL PI INDICATOR 7.0 (GLOVE) ×2
GLOVE ECLIPSE 6.5 STRL STRAW (GLOVE) ×2 IMPLANT
GLOVE SS BIOGEL STRL SZ 7 (GLOVE) ×1 IMPLANT
GLOVE SUPERSENSE BIOGEL SZ 7 (GLOVE) ×2
GLOVE SURG SS PI 6.5 STRL IVOR (GLOVE) ×2 IMPLANT
GLOVE SURG SS PI 7.0 STRL IVOR (GLOVE) ×2 IMPLANT
GOWN STRL REIN XL XLG (GOWN DISPOSABLE) ×2 IMPLANT
GOWN STRL REUS W/ TWL LRG LVL3 (GOWN DISPOSABLE) ×3 IMPLANT
GOWN STRL REUS W/TWL LRG LVL3 (GOWN DISPOSABLE) ×6
KIT BASIN OR (CUSTOM PROCEDURE TRAY) ×3 IMPLANT
KIT ROOM TURNOVER OR (KITS) ×3 IMPLANT
LIQUID BAND (GAUZE/BANDAGES/DRESSINGS) ×3 IMPLANT
NS IRRIG 1000ML POUR BTL (IV SOLUTION) ×3 IMPLANT
PACK CV ACCESS (CUSTOM PROCEDURE TRAY) ×3 IMPLANT
PAD ARMBOARD 7.5X6 YLW CONV (MISCELLANEOUS) ×6 IMPLANT
SUT PROLENE 6 0 BV (SUTURE) ×3 IMPLANT
SUT VIC AB 3-0 SH 27 (SUTURE) ×3
SUT VIC AB 3-0 SH 27X BRD (SUTURE) ×1 IMPLANT
UNDERPAD 30X30 INCONTINENT (UNDERPADS AND DIAPERS) ×3 IMPLANT
WATER STERILE IRR 1000ML POUR (IV SOLUTION) ×3 IMPLANT

## 2015-02-16 NOTE — H&P (View-Only) (Signed)
Subjective:     Patient ID: Paige Arnold, female   DOB: 1940/09/18, 74 y.o.   MRN: [id number]  HPI this 74 year old female was referred by Dr. Jamal Maes for evaluation for vascular access. Patient has been followed by Dr. Lu Duffel for about 10 years. She has a history of hypertension and she thinks that caused her kidney failure. She has never been on hemodialysis. She is right-handed. She denies pain or numbness in either hand.  Past Medical History  Diagnosis Date  . Thyroid disorder   . Kidney disease   . Arthritis   . Hypertension   . Diabetes mellitus     History  Substance Use Topics  . Smoking status: Never Smoker   . Smokeless tobacco: Never Used  . Alcohol Use: No    Family History  Problem Relation Age of Onset  . Kidney disease Mother   . Heart disease Father   . Diabetes Brother   . Alcohol abuse Brother   . Depression Brother   . Sarcoidosis Brother   . ALS Brother     Deceased, 74s  . Heart disease Brother   . Bipolar disorder Daughter     Allergies  Allergen Reactions  . Sulfa Antibiotics Itching     Current outpatient prescriptions:  .  amLODipine (NORVASC) 10 MG tablet, Take 10 mg by mouth daily., Disp: , Rfl:  .  aspirin 81 MG tablet, Take 81 mg by mouth daily., Disp: , Rfl:  .  cloNIDine (CATAPRES) 0.2 MG tablet, Take 0.2 mg by mouth daily. , Disp: , Rfl:  .  dicyclomine (BENTYL) 10 MG capsule, Take 10 mg by mouth daily., Disp: , Rfl:  .  divalproex (DEPAKOTE ER) 250 MG 24 hr tablet, 2 every morning, Disp: , Rfl:  .  donepezil (ARICEPT) 10 MG tablet, Take 1 tablet (10 mg total) by mouth at bedtime. 1 q hs, Disp: 30 tablet, Rfl: 11 .  DULoxetine (CYMBALTA) 30 MG capsule, Take 30 mg by mouth daily., Disp: , Rfl:  .  furosemide (LASIX) 40 MG tablet, Take 1 tablet (40 mg total) by mouth 2 (two) times daily. (Patient taking differently: Take 40 mg by mouth daily. ), Disp: 60 tablet, Rfl: 0 .  gabapentin (NEURONTIN) 300 MG capsule, Take 1 tablet in the  morning and 2 tablets., Disp: 90 capsule, Rfl: 11 .  gabapentin (NEURONTIN) 400 MG capsule, TAKE 1 CAP IN THE MORNING, AND 2 CAPS AT BEDTIME., Disp: , Rfl: 2 .  levothyroxine (SYNTHROID) 150 MCG tablet, Take 1 tablet (150 mcg total) by mouth daily before breakfast., Disp: 30 tablet, Rfl: 11 .  lidocaine (LIDODERM) 5 %, Place 1 patch onto the skin every 12 (twelve) hours. Remove & Discard patch within 12 hours or as directed, Disp: 30 patch, Rfl: 4 .  morphine (MSIR) 15 MG tablet, Take 1 tablet (15 mg total) by mouth 3 (three) times daily., Disp: 90 tablet, Rfl: 0 .  Multiple Vitamin (MULTIVITAMIN WITH MINERALS) TABS tablet, Take 1 tablet by mouth daily., Disp: , Rfl:  .  phenazopyridine (PYRIDIUM) 100 MG tablet, Take 100 mg by mouth as needed for pain., Disp: , Rfl:  .  pravastatin (PRAVACHOL) 40 MG tablet, Take 40 mg by mouth daily., Disp: , Rfl:  .  propranolol (INDERAL) 20 MG tablet, Take 20 mg by mouth 2 (two) times daily., Disp: , Rfl: 3 .  sitaGLIPtin (JANUVIA) 100 MG tablet, Take 50 mg by mouth daily. , Disp: , Rfl:   Filed  Vitals:   02/15/15 0940  BP: 130/76  Pulse: 66  Temp: 97.6 F (36.4 C)  TempSrc: Oral  Resp: 16  Height: 5\' 5"  (1.651 m)  Weight: 208 lb (94.348 kg)  SpO2: 98%    Body mass index is 34.61 kg/(m^2).           Review of Systems denies chest pain, dyspnea on exertion, PND, orthopnea. Has had some increasing peripheral edema recently. Has long history of severe left knee pain dating back to 1999 when she had partial knee replacement and has had pain since that time. As history of diffuse arthritis. Has positive history of diabetes mellitus type 2. Other systems negative and a complete review of systems     Objective:   Physical Exam BP 130/76 mmHg  Pulse 66  Temp(Src) 97.6 F (36.4 C) (Oral)  Resp 16  Ht 5\' 5"  (1.651 m)  Wt 208 lb (94.348 kg)  BMI 34.61 kg/m2  SpO2 98%  Gen.-alert and oriented x3 in no apparent distress HEENT normal for  age Lungs no rhonchi or wheezing Cardiovascular regular rhythm no murmurs carotid pulses 3+ palpable no bruits audible Abdomen soft nontender no palpable masses Musculoskeletal free of  major deformities Skin clear -no rashes Neurologic normal Lower extremities 3+ femoral and dorsalis pedis pulses palpable bilaterally with 1+ edema.  Today I ordered bilateral upper extremity vein mapping as well as arterial exam with duplex. Arterial system is normal with triphasic flow bilaterally on the left and decreased flow in the ulnar artery on the right. Cephalic vein in the left arm appears adequate for AV fistula creation with forearm being borderline upper arm being excellent.       Assessment:     Stage V chronic kidney disease needs vascular access History left partial knee replacement with chronic postoperative pain Diabetes mellitus type 2 Hypertension    Plan:     We'll plan left arm AV fistula tomorrow either radial cephalic or brachial cephalic. Discussed with patient the fact that fistula may not mature to satisfactory conduit may need revision or creation at another level. She understands this. Surgery scheduled for tomorrow

## 2015-02-16 NOTE — Anesthesia Postprocedure Evaluation (Signed)
  Anesthesia Post-op Note  Patient: Paige Arnold  Procedure(s) Performed: Procedure(s): LEFT ARM BRACHIOCEPHALIC (AV) FISTULA CREATION (Left) LIGATION OF COMPETING BRANCHES OF LEFT BRACHIOCEPHALIC ARTERIOVENOUS FISTULA (Left)  Patient Location: PACU  Anesthesia Type:MAC  Level of Consciousness: awake, alert  and oriented  Airway and Oxygen Therapy: Patient Spontanous Breathing  Post-op Pain: mild  Post-op Assessment: Post-op Vital signs reviewed, Patient's Cardiovascular Status Stable, Respiratory Function Stable, Patent Airway and Pain level controlled              Post-op Vital Signs: stable  Last Vitals:  Filed Vitals:   02/16/15 1245  BP: 126/59  Pulse: 60  Temp:   Resp: 18    Complications: No apparent anesthesia complications

## 2015-02-16 NOTE — Interval H&P Note (Signed)
History and Physical Interval Note:  02/16/2015 9:55 AM  Paige Arnold  has presented today for surgery, with the diagnosis of End Stage Renal Disease N18.6  The various methods of treatment have been discussed with the patient and family. After consideration of risks, benefits and other options for treatment, the patient has consented to  Procedure(s): RADIOCEPHALIC VERSUS BRACHIOCEPHALIC ARTERIOVENOUS (AV) FISTULA CREATION (Left) as a surgical intervention .  The patient's history has been reviewed, patient examined, no change in status, stable for surgery.  I have reviewed the patient's chart and labs.  Questions were answered to the patient's satisfaction.     Tinnie Gens

## 2015-02-16 NOTE — OR Nursing (Signed)
Discharge instructions reviewed with patient and her husband.  All questions answered. Patient is getting dressed and will be discharged into the care of her husband.

## 2015-02-16 NOTE — Transfer of Care (Signed)
Immediate Anesthesia Transfer of Care Note  Patient: Paige Arnold  Procedure(s) Performed: Procedure(s): LEFT ARM BRACHIOCEPHALIC (AV) FISTULA CREATION (Left) LIGATION OF COMPETING BRANCHES OF LEFT BRACHIOCEPHALIC ARTERIOVENOUS FISTULA (Left)  Patient Location: PACU  Anesthesia Type:MAC  Level of Consciousness: awake, alert  and oriented  Airway & Oxygen Therapy: Patient Spontanous Breathing  Post-op Assessment: Report given to RN  Post vital signs: stable  Last Vitals:  Filed Vitals:   02/16/15 0803  BP: 128/64  Pulse: 63  Temp: 123XX123 C    Complications: No apparent anesthesia complications

## 2015-02-16 NOTE — Op Note (Signed)
OPERATIVE REPORT  Date of Surgery: 02/16/2015  Surgeon: Tinnie Gens, MD  Assistant: Nurse  Pre-op Diagnosis-stage V chronic kidney disease  Post-op Diagnosis: Stage 5 Chronic Kidney Disease  Procedure: Procedure(s): LEFT ARM BRACHIOCEPHALIC (AV) FISTULA CREATION LIGATION OF COMPETING BRANCHES OF LEFT BRACHIOCEPHALIC ARTERIOVENOUS FISTULA  Anesthesia: Mac  EBL: Minimal  Complications: None  Procedure Details: The patient was taken the operating room placed in supine position at which time the left upper extremity was prepped with Betadine scrub and solution draped in routine sterile manner. Using B-mode ultrasound-sono site we image the cephalic vein from the wrist to the shoulder level. It was adequate in the upper arm forearm was marginal and was not felt to be satisfactory for fistula therefore was decided to create a brachial-cephalic AV fistula. After infiltration with 1% Xylocaine with epinephrine transverse incision was made and cubital area and cubital vein dissected free. Cephalic branch was dissected free distally branches ligated with 3-0 silk ties and divided it was ligated distally and transected gently dilated with heparinized saline it was at least 2-1/2-3 mm in size throughout. Brachial artery was then exposed beneath fashion circle with vessel loops was an excellent artery. It was occluded proximally and distally opened 15 blade and extended with Potts scissors it was good inflow. Vein was carefully measured spatulated and anastomosed end-to-side with 60 proline. Vessel loops were released and a good pulse and palpable thrill in the fistula. There was good radial and ulnar flow distally which did improve with compression of the fistula. The upper arm fistula was then imaged with the ultrasound in 1 large competing branch was identified and marked on the skin after infiltration with 1% Xylocaine with epinephrine short longitudinal incision was made branch easily identified  ligated with 3-0 silk tie. Adequate hemostasis was achieved and wounds were closed in layers with Vicryls in subcuticular fashion with Dermabond patient taken to the recovery room in satisfactory condition   Tinnie Gens, MD 02/16/2015 11:44 AM

## 2015-02-16 NOTE — Anesthesia Preprocedure Evaluation (Signed)
Anesthesia Evaluation  Patient identified by MRN, date of birth, ID band Patient awake    Reviewed: Allergy & Precautions, NPO status , Patient's Chart, lab work & pertinent test results  Airway Mallampati: II  TM Distance: >3 FB Neck ROM: Full    Dental  (+) Teeth Intact, Dental Advisory Given   Pulmonary  breath sounds clear to auscultation        Cardiovascular hypertension, Rhythm:Regular Rate:Normal     Neuro/Psych    GI/Hepatic   Endo/Other  diabetes  Renal/GU      Musculoskeletal   Abdominal   Peds  Hematology   Anesthesia Other Findings   Reproductive/Obstetrics                             Anesthesia Physical Anesthesia Plan  ASA: III  Anesthesia Plan: MAC   Post-op Pain Management:    Induction: Intravenous  Airway Management Planned: Natural Airway and Simple Face Mask  Additional Equipment:   Intra-op Plan:   Post-operative Plan:   Informed Consent: I have reviewed the patients History and Physical, chart, labs and discussed the procedure including the risks, benefits and alternatives for the proposed anesthesia with the patient or authorized representative who has indicated his/her understanding and acceptance.   Dental advisory given  Plan Discussed with: CRNA and Anesthesiologist  Anesthesia Plan Comments: (ESRD has not started HD K-4.4 Type 2 DM glucose 141 Diabetic neuropathy Hypertension Mild cognitive impairment  Plan MAC  Roberts Gaudy)        Anesthesia Quick Evaluation

## 2015-02-17 ENCOUNTER — Telehealth: Payer: Self-pay

## 2015-02-17 ENCOUNTER — Encounter (HOSPITAL_COMMUNITY): Payer: Self-pay | Admitting: Vascular Surgery

## 2015-02-17 ENCOUNTER — Ambulatory Visit (HOSPITAL_COMMUNITY): Payer: Self-pay | Admitting: Clinical

## 2015-02-17 NOTE — Telephone Encounter (Signed)
Phone call from pt.  Voiced concern of redness between incisional areas left upper arm.  Denied any separation of incision; denied drainage.  Denied fever/ chills.  Reported some swelling in the upper arm.  Advised that the initial redness at 24 hrs post op may be typical inflammation that occurs in the early phase of healing.  Advised to continue to monitor, keep incision clean/ dry, and elevate left arm @ intervals, on pillows, above level of heart.  Encouraged to call office if symptoms worsen or don't improve.  Verb. Understanding.

## 2015-02-18 ENCOUNTER — Telehealth: Payer: Self-pay | Admitting: Vascular Surgery

## 2015-02-18 NOTE — Telephone Encounter (Addendum)
-----   Message from Denman George, RN sent at 02/16/2015  4:04 PM EDT ----- Regarding: needs 6 wk. access duplex and f/u with Dr. Kellie Simmering   ----- Message -----    From: Alvia Grove, PA-C    Sent: 02/16/2015  11:53 AM      To: [facility] Charge Pool  S/p left BC-AVF with ligation of competing branch 02/16/15  F/u with [facility] in 6 weeks with duplex  Thanks Kim  notified patient of post op appts. 04-06-15 at 11 am for duplex, then 04-12-15 at 8:45 to see dr. Kellie Simmering

## 2015-03-05 ENCOUNTER — Other Ambulatory Visit (HOSPITAL_COMMUNITY): Payer: Self-pay | Admitting: Psychiatry

## 2015-03-10 ENCOUNTER — Encounter: Payer: Self-pay | Admitting: Registered Nurse

## 2015-03-10 ENCOUNTER — Encounter: Payer: Worker's Compensation | Attending: Registered Nurse | Admitting: Registered Nurse

## 2015-03-10 ENCOUNTER — Telehealth: Payer: Self-pay | Admitting: Registered Nurse

## 2015-03-10 VITALS — BP 150/73 | HR 65 | Resp 14

## 2015-03-10 DIAGNOSIS — M79671 Pain in right foot: Secondary | ICD-10-CM | POA: Diagnosis not present

## 2015-03-10 DIAGNOSIS — M79642 Pain in left hand: Secondary | ICD-10-CM | POA: Diagnosis not present

## 2015-03-10 DIAGNOSIS — E079 Disorder of thyroid, unspecified: Secondary | ICD-10-CM | POA: Diagnosis not present

## 2015-03-10 DIAGNOSIS — Z5181 Encounter for therapeutic drug level monitoring: Secondary | ICD-10-CM | POA: Diagnosis not present

## 2015-03-10 DIAGNOSIS — G8929 Other chronic pain: Secondary | ICD-10-CM | POA: Diagnosis not present

## 2015-03-10 DIAGNOSIS — M47817 Spondylosis without myelopathy or radiculopathy, lumbosacral region: Secondary | ICD-10-CM

## 2015-03-10 DIAGNOSIS — M1990 Unspecified osteoarthritis, unspecified site: Secondary | ICD-10-CM | POA: Insufficient documentation

## 2015-03-10 DIAGNOSIS — M545 Low back pain: Secondary | ICD-10-CM | POA: Diagnosis not present

## 2015-03-10 DIAGNOSIS — Z79899 Other long term (current) drug therapy: Secondary | ICD-10-CM

## 2015-03-10 DIAGNOSIS — E1140 Type 2 diabetes mellitus with diabetic neuropathy, unspecified: Secondary | ICD-10-CM | POA: Diagnosis not present

## 2015-03-10 DIAGNOSIS — R5381 Other malaise: Secondary | ICD-10-CM

## 2015-03-10 DIAGNOSIS — G894 Chronic pain syndrome: Secondary | ICD-10-CM | POA: Diagnosis not present

## 2015-03-10 DIAGNOSIS — M4800 Spinal stenosis, site unspecified: Secondary | ICD-10-CM | POA: Diagnosis not present

## 2015-03-10 DIAGNOSIS — Z760 Encounter for issue of repeat prescription: Secondary | ICD-10-CM | POA: Insufficient documentation

## 2015-03-10 DIAGNOSIS — M79672 Pain in left foot: Secondary | ICD-10-CM | POA: Diagnosis not present

## 2015-03-10 DIAGNOSIS — M25562 Pain in left knee: Secondary | ICD-10-CM

## 2015-03-10 DIAGNOSIS — N289 Disorder of kidney and ureter, unspecified: Secondary | ICD-10-CM | POA: Diagnosis not present

## 2015-03-10 DIAGNOSIS — I10 Essential (primary) hypertension: Secondary | ICD-10-CM | POA: Insufficient documentation

## 2015-03-10 DIAGNOSIS — M79641 Pain in right hand: Secondary | ICD-10-CM | POA: Diagnosis not present

## 2015-03-10 MED ORDER — MORPHINE SULFATE 15 MG PO TABS: 15.0000 mg | ORAL_TABLET | Freq: Three times a day (TID) | ORAL | Status: DC

## 2015-03-10 NOTE — Progress Notes (Signed)
Subjective:    Patient ID: Paige Arnold, female    DOB: [birth year], 74 y.o.   MRN: [id number]  HPI: Paige Arnold is a 74 year old female who returns for follow up for chronic pain and medication refill. She says her pain is located in her lower back, left knee and bilateral feet. She rates her pain 6. Her current exercise regime is walking. She forgot her Morphine pill bottle medication was picked up on 02/28/15. She was instructed to bring medication to every scheduled appointment she verbalizes understanding. She's following neurologist for cognitive impairment on Aricept, spoke with daughter regarding the above. She verbalizes understanding.   She had a Left AVF placed on 02/16/15  +bruit and thrill. She was prescribed oxycodone, I spoke with her daughter Paige Arnold the prescription wasn't filled. Checked with Nolan to verify.   Pain Inventory Average Pain 6 Pain Right Now 6 My pain is constant, sharp, burning, dull, stabbing, tingling and aching  In the last 24 hours, has pain interfered with the following? General activity 2 Relation with others 1 Enjoyment of life 1 What TIME of day is your pain at its worst? all Sleep (in general) Fair  Pain is worse with: walking, bending and standing Pain improves with: rest, heat/ice and medication Relief from Meds: 5  Mobility walk with assistance use a cane ability to climb steps?  no do you drive?  no  Function retired I need assistance with the following:  meal prep, household duties and shopping  Neuro/Psych weakness numbness tingling trouble walking dizziness anxiety  Prior Studies Any changes since last visit?  no  Physicians involved in your care Any changes since last visit?  no   Family History  Problem Relation Age of Onset  . Kidney disease Mother   . Heart disease Father   . Diabetes Brother   . Alcohol abuse Brother   . Depression Brother   . Sarcoidosis Brother   . ALS Brother     Deceased, 5s  .  Heart disease Brother   . Bipolar disorder Daughter    Social History   Social History  . Marital Status: Married    Spouse Name: N/A  . Number of Children: 3  . Years of Education: N/A   Occupational History  . retired    Social History Main Topics  . Smoking status: Never Smoker   . Smokeless tobacco: Never Used  . Alcohol Use: No  . Drug Use: No  . Sexual Activity: Yes   Other Topics Concern  . None   Social History Narrative   She lives with husband and daughter.  Three grown children.   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     She took early retired at 46 because of problems of RSD.   Highest level of education:  Graduated high school      Past Surgical History  Procedure Laterality Date  . Tubal ligation    . Temporomandibular joint surgery    . Partial knee arthroplasty      left  . Cholecystectomy    . Intraocular lens insertion Right 10-21-13  . Partial hip arthroplasty    . Tonsillectomy    . Av fistula placement Left 02/16/2015    Procedure: LEFT ARM BRACHIOCEPHALIC (AV) FISTULA CREATION;  Surgeon: Mal Misty, MD;  Location: [place];  Service: Vascular;  Laterality: Left;  . Ligation of competing branches of arteriovenous fistula Left 02/16/2015  Procedure: LIGATION OF COMPETING BRANCHES OF LEFT BRACHIOCEPHALIC ARTERIOVENOUS FISTULA;  Surgeon: Mal Misty, MD;  Location: [place];  Service: Vascular;  Laterality: Left;   Past Medical History  Diagnosis Date  . Thyroid disorder   . Kidney disease   . Arthritis   . Hypertension   . Diabetes mellitus   . Shortness of breath dyspnea     when I first lay down, then it gets better  . Sleep apnea     does not wear CPAP   . GERD (gastroesophageal reflux disease)   . Diabetic neuropathy    BP 150/73 mmHg  Pulse 65  Resp 14  SpO2 97%  Opioid Risk Score:   Fall Risk Score:  `1  Depression screen PHQ 2/9  Depression screen [facility] 2/9 02/03/2015 10/01/2014  Decreased Interest 1 1    Down, Depressed, Hopeless 2 2  PHQ - 2 Score 3 3  Altered sleeping 2 2  Tired, decreased energy 2 2  Change in appetite 1 1  Feeling bad or failure about yourself  0 0  Trouble concentrating 1 1  Moving slowly or fidgety/restless 1 1  Suicidal thoughts 0 0  PHQ-9 Score 10 10     Review of Systems  Constitutional: Positive for diaphoresis and unexpected weight change.  Gastrointestinal: Positive for constipation.  Endocrine:       High blood sugar  Musculoskeletal: Positive for gait problem.  Neurological: Positive for dizziness, weakness and numbness.       Tingling   Psychiatric/Behavioral: The patient is nervous/anxious.   All other systems reviewed and are negative.      Objective:   Physical Exam  Constitutional: She is oriented to person, place, and time. She appears well-developed and well-nourished.  HENT:  Head: Normocephalic and atraumatic.  Neck: Normal range of motion. Neck supple.  Cardiovascular: Normal rate and regular rhythm.   Pulmonary/Chest: Effort normal and breath sounds normal.  Musculoskeletal:  Normal Muscle Bulk and Muscle Testing Reveals: Upper Extremities: Full ROM and Muscle Strength 5/5 Thoracic Paraspinal Tenderness: T-1- T-2 Lumbar Paraspinal Tenderness:L-3- L-4 Lower Extremities: Full ROM and Muscle Strength 5/5 Arises from chair slowly/ Using straight cane for support Narrow Based gait  Neurological: She is alert and oriented to person, place, and time.  Skin: Skin is warm and dry.  Psychiatric: She has a normal mood and affect.          Assessment & Plan:  1. Spinal stenosis chronic low back pain radiating into the legs: Continue MSIR 15 mg take one tablet by mouth three times daily #90. 2. Diabetic Neuropathy: Continue Gabapentin.  3. Left Knee Pain: Continue Current Medication and exercise Regimen. ( RSD) Continue Lidoderm Patches. 4. Left Hip Fracture S/P hemiarthroplasty: Orthopedist Follwing at [facility]  20  minutes of face to face patient care time was spent during this visit. All questions were encouraged and answered.   F/U in 1 month

## 2015-03-10 NOTE — Patient Instructions (Signed)
Bring Your Pill Bottle with you every appointment   Have Your Daughter Pick up your Prescription and return Oxycodone to office to be waisted

## 2015-03-14 NOTE — Telephone Encounter (Signed)
Paige Arnold's daughter called and wants to pick up the rx for the power recliner.

## 2015-03-15 NOTE — Telephone Encounter (Signed)
Spoke to Mrs. Chipman this morning she is aware her prescription for power lift chair is ready for pick up. Her daughter Kelli Churn will pick it up.

## 2015-04-06 ENCOUNTER — Ambulatory Visit (HOSPITAL_COMMUNITY)
Admission: RE | Admit: 2015-04-06 | Discharge: 2015-04-06 | Disposition: A | Discharge status: Home / Self Care | Payer: Medicare Other | Source: Ambulatory Visit | Attending: Vascular Surgery | Admitting: Vascular Surgery

## 2015-04-06 DIAGNOSIS — I10 Essential (primary) hypertension: Secondary | ICD-10-CM | POA: Diagnosis not present

## 2015-04-06 DIAGNOSIS — E1142 Type 2 diabetes mellitus with diabetic polyneuropathy: Secondary | ICD-10-CM | POA: Insufficient documentation

## 2015-04-06 DIAGNOSIS — Z48812 Encounter for surgical aftercare following surgery on the circulatory system: Secondary | ICD-10-CM | POA: Insufficient documentation

## 2015-04-06 DIAGNOSIS — N186 End stage renal disease: Secondary | ICD-10-CM | POA: Insufficient documentation

## 2015-04-12 ENCOUNTER — Ambulatory Visit: Payer: Self-pay | Admitting: Registered Nurse

## 2015-04-12 ENCOUNTER — Ambulatory Visit (INDEPENDENT_AMBULATORY_CARE_PROVIDER_SITE_OTHER): Payer: Medicare Other | Admitting: Vascular Surgery

## 2015-04-12 ENCOUNTER — Encounter: Payer: Self-pay | Admitting: Vascular Surgery

## 2015-04-12 VITALS — BP 137/79 | HR 68 | Ht 65.0 in | Wt 204.0 lb

## 2015-04-12 DIAGNOSIS — N184 Chronic kidney disease, stage 4 (severe): Secondary | ICD-10-CM

## 2015-04-12 DIAGNOSIS — E1122 Type 2 diabetes mellitus with diabetic chronic kidney disease: Secondary | ICD-10-CM

## 2015-04-12 NOTE — Progress Notes (Signed)
Subjective:     Patient ID: Paige Arnold, female   DOB: Sep 21, 1940, 74 y.o.   MRN: [id number]  HPI this 74 year old female had left brachial-cephalic AV fistula created by me on 02/16/2015. She is not yet on hemodialysis. She has been having cold sensations in the left hand but denies aching throbbing discomfort and total numbness of the hand. Symptoms have been stable over the past few weeks. She generally tries to warm the hand with a glove if this occurs with some relief.   Review of Systems     Objective:   Physical Exam BP 137/79 mmHg  Pulse 68  Ht 5\' 5"  (1.651 m)  Wt 204 lb (92.534 kg)  BMI 33.95 kg/m2  SpO2 95%  Gen. well-developed well-nourished female no apparent distress alert and oriented 3 Left antecubital wound nicely healed with excellent pulse and palpable thrill and brachial-cephalic AV fistula. Left hand is pink and well perfused. 2+ radial pulse palpable. Good strength with left hand grip.     Assessment:     Left brachial-cephalic AV fistula created 02/16/2015 in patient with stage IV chronic kidney disease-not on hemodialysis Patient has mild steal symptoms and this will need to be watched Discussed with her the fact that this could progress to pain and numbness in the hand and require revision or ligation of the fistula. Symptoms are not severe enough for that the present time    Plan:     Return in 8 weeks with duplex scan of left brachial cephalic AV fistulogram and check for steal Patient knows that if symptoms worsen to involve pain and numbness in the hand she will be in touch with [place]

## 2015-04-13 ENCOUNTER — Encounter: Payer: Self-pay | Admitting: Registered Nurse

## 2015-04-13 ENCOUNTER — Encounter: Payer: Worker's Compensation | Attending: Registered Nurse | Admitting: Registered Nurse

## 2015-04-13 VITALS — BP 134/70 | HR 61 | Resp 14

## 2015-04-13 DIAGNOSIS — G8929 Other chronic pain: Secondary | ICD-10-CM | POA: Diagnosis not present

## 2015-04-13 DIAGNOSIS — G894 Chronic pain syndrome: Secondary | ICD-10-CM

## 2015-04-13 DIAGNOSIS — M79641 Pain in right hand: Secondary | ICD-10-CM | POA: Diagnosis not present

## 2015-04-13 DIAGNOSIS — E079 Disorder of thyroid, unspecified: Secondary | ICD-10-CM | POA: Insufficient documentation

## 2015-04-13 DIAGNOSIS — M4800 Spinal stenosis, site unspecified: Secondary | ICD-10-CM | POA: Diagnosis not present

## 2015-04-13 DIAGNOSIS — M79642 Pain in left hand: Secondary | ICD-10-CM | POA: Diagnosis not present

## 2015-04-13 DIAGNOSIS — M545 Low back pain: Secondary | ICD-10-CM | POA: Diagnosis not present

## 2015-04-13 DIAGNOSIS — M25562 Pain in left knee: Secondary | ICD-10-CM | POA: Insufficient documentation

## 2015-04-13 DIAGNOSIS — E1140 Type 2 diabetes mellitus with diabetic neuropathy, unspecified: Secondary | ICD-10-CM | POA: Insufficient documentation

## 2015-04-13 DIAGNOSIS — Z79899 Other long term (current) drug therapy: Secondary | ICD-10-CM

## 2015-04-13 DIAGNOSIS — M47817 Spondylosis without myelopathy or radiculopathy, lumbosacral region: Secondary | ICD-10-CM | POA: Diagnosis not present

## 2015-04-13 DIAGNOSIS — Z760 Encounter for issue of repeat prescription: Secondary | ICD-10-CM | POA: Insufficient documentation

## 2015-04-13 DIAGNOSIS — M79671 Pain in right foot: Secondary | ICD-10-CM | POA: Insufficient documentation

## 2015-04-13 DIAGNOSIS — M1990 Unspecified osteoarthritis, unspecified site: Secondary | ICD-10-CM | POA: Insufficient documentation

## 2015-04-13 DIAGNOSIS — N289 Disorder of kidney and ureter, unspecified: Secondary | ICD-10-CM | POA: Insufficient documentation

## 2015-04-13 DIAGNOSIS — I10 Essential (primary) hypertension: Secondary | ICD-10-CM | POA: Insufficient documentation

## 2015-04-13 DIAGNOSIS — M79672 Pain in left foot: Secondary | ICD-10-CM | POA: Insufficient documentation

## 2015-04-13 DIAGNOSIS — Z5181 Encounter for therapeutic drug level monitoring: Secondary | ICD-10-CM | POA: Diagnosis not present

## 2015-04-13 MED ORDER — MORPHINE SULFATE 15 MG PO TABS: 15.0000 mg | ORAL_TABLET | Freq: Three times a day (TID) | ORAL | Status: DC

## 2015-04-13 NOTE — Progress Notes (Signed)
Subjective:    Patient ID: Paige Arnold, female    DOB: [birth year], 74 y.o.   MRN: [id number]  HPI: Paige Arnold is a 74 year old female who returns for follow up for chronic pain and medication refill. She says her pain is located in her lower back and left knee. She rates her pain 4. Her current exercise regime is walking. Also states she was in [facility] for 5 days for Pneumonia.  Pain Inventory Average Pain 6 Pain Right Now 4 My pain is sharp, burning, stabbing, tingling and aching  In the last 24 hours, has pain interfered with the following? General activity 9 Relation with others 9 Enjoyment of life 9 What TIME of day is your pain at its worst? all Sleep (in general) Poor  Pain is worse with: walking, bending and standing Pain improves with: rest, heat/ice and medication Relief from Meds: 5  Mobility walk with assistance use a cane ability to climb steps?  no do you drive?  no needs help with transfers Do you have any goals in this area?  no  Function retired I need assistance with the following:  meal prep, household duties and shopping Do you have any goals in this area?  no  Neuro/Psych No problems in this area  Prior Studies Any changes since last visit?  no  Physicians involved in your care Any changes since last visit?  no   Family History  Problem Relation Age of Onset  . Kidney disease Mother   . Heart disease Father   . Diabetes Brother   . Alcohol abuse Brother   . Depression Brother   . Sarcoidosis Brother   . ALS Brother     Deceased, 68s  . Heart disease Brother   . Bipolar disorder Daughter    Social History   Social History  . Marital Status: Married    Spouse Name: N/A  . Number of Children: 3  . Years of Education: N/A   Occupational History  . retired    Social History Main Topics  . Smoking status: Never Smoker   . Smokeless tobacco: Never Used  . Alcohol Use: No  . Drug Use: No  . Sexual Activity: Yes     Other Topics Concern  . None   Social History Narrative   She lives with husband and daughter.  Three grown children.   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     She took early retired at 26 because of problems of RSD.   Highest level of education:  Graduated high school      Past Surgical History  Procedure Laterality Date  . Tubal ligation    . Temporomandibular joint surgery    . Partial knee arthroplasty      left  . Cholecystectomy    . Intraocular lens insertion Right 10-21-13  . Partial hip arthroplasty    . Tonsillectomy    . Av fistula placement Left 02/16/2015    Procedure: LEFT ARM BRACHIOCEPHALIC (AV) FISTULA CREATION;  Surgeon: Mal Misty, MD;  Location: [place];  Service: Vascular;  Laterality: Left;  . Ligation of competing branches of arteriovenous fistula Left 02/16/2015    Procedure: LIGATION OF COMPETING BRANCHES OF LEFT BRACHIOCEPHALIC ARTERIOVENOUS FISTULA;  Surgeon: Mal Misty, MD;  Location: [place];  Service: Vascular;  Laterality: Left;   Past Medical History  Diagnosis Date  . Thyroid disorder   . Kidney disease   .  Arthritis   . Hypertension   . Diabetes mellitus   . Shortness of breath dyspnea     when I first lay down, then it gets better  . Sleep apnea     does not wear CPAP   . GERD (gastroesophageal reflux disease)   . Diabetic neuropathy    BP 134/70 mmHg  Pulse 61  Resp 14  SpO2 94%  Opioid Risk Score:   Fall Risk Score:  `1  Depression screen PHQ 2/9  Depression screen [facility] 2/9 02/03/2015 10/01/2014  Decreased Interest 1 1  Down, Depressed, Hopeless 2 2  PHQ - 2 Score 3 3  Altered sleeping 2 2  Tired, decreased energy 2 2  Change in appetite 1 1  Feeling bad or failure about yourself  0 0  Trouble concentrating 1 1  Moving slowly or fidgety/restless 1 1  Suicidal thoughts 0 0  PHQ-9 Score 10 10     Review of Systems  Constitutional: Positive for diaphoresis.  Respiratory:       Respiratory  infections  Gastrointestinal: Positive for abdominal pain and diarrhea.  All other systems reviewed and are negative.      Objective:   Physical Exam  Constitutional: She is oriented to person, place, and time. She appears well-developed and well-nourished.  HENT:  Head: Normocephalic and atraumatic.  Neck: Normal range of motion. Neck supple.  Cardiovascular: Normal rate and regular rhythm.   Pulmonary/Chest: Effort normal and breath sounds normal.  Musculoskeletal:  Upper Extremities: Full ROM and Muscle Strength 5/5 Thoracic Paraspinal Tenderness: T-04-16-11 Lower Extremities: Full ROM and Muscle Strength 5/5 Arises from chair slowly using straight cane for support Antalgic gait  Neurological: She is alert and oriented to person, place, and time.  Skin: Skin is warm and dry.  Psychiatric: She has a normal mood and affect.  Nursing note and vitals reviewed.         Assessment & Plan:  1. Spinal stenosis chronic low back pain radiating into the legs: Continue MSIR 15 mg take one tablet by mouth three times daily #90. 2. Diabetic Neuropathy: Continue Gabapentin.  3. Left Knee Pain: Continue Current Medication and exercise Regimen. ( RSD) Continue Lidoderm Patches. 4. Left Hip Fracture S/P hemiarthroplasty: Orthopedist Follwing at [facility]  20 minutes of face to face patient care time was spent during this visit. All questions were encouraged and answered.   F/U in 1 month

## 2015-04-16 DIAGNOSIS — J189 Pneumonia, unspecified organism: Secondary | ICD-10-CM

## 2015-04-16 HISTORY — DX: Pneumonia, unspecified organism: J18.9

## 2015-05-13 ENCOUNTER — Other Ambulatory Visit: Payer: Self-pay | Admitting: Physical Medicine & Rehabilitation

## 2015-05-13 ENCOUNTER — Encounter: Payer: Worker's Compensation | Attending: Registered Nurse | Admitting: Registered Nurse

## 2015-05-13 ENCOUNTER — Encounter: Payer: Self-pay | Admitting: Registered Nurse

## 2015-05-13 VITALS — BP 161/71 | HR 67

## 2015-05-13 DIAGNOSIS — Z5181 Encounter for therapeutic drug level monitoring: Secondary | ICD-10-CM

## 2015-05-13 DIAGNOSIS — G894 Chronic pain syndrome: Secondary | ICD-10-CM

## 2015-05-13 DIAGNOSIS — M1990 Unspecified osteoarthritis, unspecified site: Secondary | ICD-10-CM | POA: Diagnosis not present

## 2015-05-13 DIAGNOSIS — M79672 Pain in left foot: Secondary | ICD-10-CM | POA: Insufficient documentation

## 2015-05-13 DIAGNOSIS — M79641 Pain in right hand: Secondary | ICD-10-CM | POA: Diagnosis not present

## 2015-05-13 DIAGNOSIS — I10 Essential (primary) hypertension: Secondary | ICD-10-CM | POA: Diagnosis not present

## 2015-05-13 DIAGNOSIS — M79671 Pain in right foot: Secondary | ICD-10-CM | POA: Insufficient documentation

## 2015-05-13 DIAGNOSIS — M79642 Pain in left hand: Secondary | ICD-10-CM | POA: Insufficient documentation

## 2015-05-13 DIAGNOSIS — E079 Disorder of thyroid, unspecified: Secondary | ICD-10-CM | POA: Diagnosis not present

## 2015-05-13 DIAGNOSIS — M545 Low back pain: Secondary | ICD-10-CM | POA: Diagnosis not present

## 2015-05-13 DIAGNOSIS — E1140 Type 2 diabetes mellitus with diabetic neuropathy, unspecified: Secondary | ICD-10-CM | POA: Diagnosis not present

## 2015-05-13 DIAGNOSIS — M4800 Spinal stenosis, site unspecified: Secondary | ICD-10-CM | POA: Insufficient documentation

## 2015-05-13 DIAGNOSIS — M47817 Spondylosis without myelopathy or radiculopathy, lumbosacral region: Secondary | ICD-10-CM | POA: Diagnosis not present

## 2015-05-13 DIAGNOSIS — N289 Disorder of kidney and ureter, unspecified: Secondary | ICD-10-CM | POA: Diagnosis not present

## 2015-05-13 DIAGNOSIS — M25562 Pain in left knee: Secondary | ICD-10-CM | POA: Insufficient documentation

## 2015-05-13 DIAGNOSIS — G8929 Other chronic pain: Secondary | ICD-10-CM | POA: Insufficient documentation

## 2015-05-13 DIAGNOSIS — Z760 Encounter for issue of repeat prescription: Secondary | ICD-10-CM | POA: Diagnosis not present

## 2015-05-13 DIAGNOSIS — Z79899 Other long term (current) drug therapy: Secondary | ICD-10-CM

## 2015-05-13 MED ORDER — MORPHINE SULFATE 15 MG PO TABS: 15.0000 mg | ORAL_TABLET | Freq: Three times a day (TID) | ORAL | Status: DC

## 2015-05-13 NOTE — Progress Notes (Signed)
Subjective:    Patient ID: Paige Arnold, female    DOB: 10-Sep-1940, 74 y.o.   MRN: [id number]  HPI: Paige Arnold is a 74 year old female who returns for follow up for chronic pain and medication refill. She says her pain is located in her lower back and left knee. She rates her pain 4. Her current exercise regime is walking short distances with walker.   Pain Inventory Average Pain 4 Pain Right Now 4 My pain is sharp, burning, dull, stabbing, tingling and aching  In the last 24 hours, has pain interfered with the following? General activity 2 Relation with others 2 Enjoyment of life 2 What TIME of day is your pain at its worst? Morning, Daytime, Evening and Night Sleep (in general) Fair  Pain is worse with: walking, bending, standing and some activites Pain improves with: rest, heat/ice and medication Relief from Meds: 6  Mobility walk with assistance use a cane ability to climb steps?  yes do you drive?  no  Function disabled: date disabled NA I need assistance with the following:  meal prep, household duties and shopping  Neuro/Psych numbness tremor tingling trouble walking  Prior Studies Any changes since last visit?  no  Physicians involved in your care Any changes since last visit?  no   Family History  Problem Relation Age of Onset  . Kidney disease Mother   . Heart disease Father   . Diabetes Brother   . Alcohol abuse Brother   . Depression Brother   . Sarcoidosis Brother   . ALS Brother     Deceased, 38s  . Heart disease Brother   . Bipolar disorder Daughter    Social History   Social History  . Marital Status: Married    Spouse Name: N/A  . Number of Children: 3  . Years of Education: N/A   Occupational History  . retired    Social History Main Topics  . Smoking status: Never Smoker   . Smokeless tobacco: Never Used  . Alcohol Use: No  . Drug Use: No  . Sexual Activity: Yes   Other Topics Concern  . None   Social History  Narrative   She lives with husband and daughter.  Three grown children.   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     She took early retired at 74 because of problems of RSD.   Highest level of education:  Graduated high school      Past Surgical History  Procedure Laterality Date  . Tubal ligation    . Temporomandibular joint surgery    . Partial knee arthroplasty      left  . Cholecystectomy    . Intraocular lens insertion Right 10-21-13  . Partial hip arthroplasty    . Tonsillectomy    . Av fistula placement Left 02/16/2015    Procedure: LEFT ARM BRACHIOCEPHALIC (AV) FISTULA CREATION;  Surgeon: Mal Misty, MD;  Location: [place];  Service: Vascular;  Laterality: Left;  . Ligation of competing branches of arteriovenous fistula Left 02/16/2015    Procedure: LIGATION OF COMPETING BRANCHES OF LEFT BRACHIOCEPHALIC ARTERIOVENOUS FISTULA;  Surgeon: Mal Misty, MD;  Location: [place];  Service: Vascular;  Laterality: Left;   Past Medical History  Diagnosis Date  . Thyroid disorder   . Kidney disease   . Arthritis   . Hypertension   . Diabetes mellitus   . Shortness of breath dyspnea  when I first lay down, then it gets better  . Sleep apnea     does not wear CPAP   . GERD (gastroesophageal reflux disease)   . Diabetic neuropathy (HCC)    BP 161/71 mmHg  Pulse 67  SpO2 93%  Opioid Risk Score:   Fall Risk Score:  `1  Depression screen PHQ 2/9  Depression screen [facility] 2/9 02/03/2015 10/01/2014  Decreased Interest 1 1  Down, Depressed, Hopeless 2 2  PHQ - 2 Score 3 3  Altered sleeping 2 2  Tired, decreased energy 2 2  Change in appetite 1 1  Feeling bad or failure about yourself  0 0  Trouble concentrating 1 1  Moving slowly or fidgety/restless 1 1  Suicidal thoughts 0 0  PHQ-9 Score 10 10     Review of Systems  Gastrointestinal: Positive for abdominal pain and constipation.  Neurological: Positive for tremors and numbness.       Tingling Gait  Instability  All other systems reviewed and are negative.      Objective:   Physical Exam  Constitutional: She is oriented to person, place, and time. She appears well-developed and well-nourished.  HENT:  Head: Normocephalic and atraumatic.  Neck: Normal range of motion. Neck supple.  Cardiovascular: Normal rate and regular rhythm.   Pulmonary/Chest: Effort normal and breath sounds normal.  Musculoskeletal:  Normal Muscle Bulk and Muscle Testing Reveals: Upper Extremities: Full ROM and Muscle Strength 5/5 Thoracic Paraspinal Tenderness: T-1- T-3 Lumbar Paraspinal Tenderness: L-4- L-5 Lower Extremities: Full ROM and Muscle Strength 5/5 Left Lower Extremity Flexion Produces pain into Patella Arises from chair slowly Using cadillac walker for support Narrow Based gait  Neurological: She is alert and oriented to person, place, and time.  Skin: Skin is warm and dry.  Psychiatric: She has a normal mood and affect.  Nursing note and vitals reviewed.         Assessment & Plan:  1. Spinal stenosis chronic low back pain radiating into the legs: Continue MSIR 15 mg take one tablet by mouth three times daily #90. 2. Diabetic Neuropathy: Continue Gabapentin.  3. Left Knee Pain: Continue Current Medication and exercise Regimen. ( RSD) Continue Lidoderm Patches. 4. Left Hip Fracture S/P hemiarthroplasty: Orthopedist Follwing at [facility]  20 minutes of face to face patient care time was spent during this visit. All questions were encouraged and answered.   F/U in 1 month

## 2015-05-17 LAB — PMP ALCOHOL METABOLITE (ETG): ETGU: NEGATIVE ng/mL

## 2015-05-18 LAB — OPIATES/OPIOIDS (LC/MS-MS)
Codeine Urine: NEGATIVE ng/mL (ref <50)
HYDROCODONE: NEGATIVE ng/mL (ref <50)
HYDROMORPHONE: 56 ng/mL (ref <50)
Morphine Urine: 16991 ng/mL (ref <50)
Norhydrocodone, Ur: NEGATIVE ng/mL (ref <50)
Noroxycodone, Ur: NEGATIVE ng/mL (ref <50)
OXYCODONE, UR: NEGATIVE ng/mL (ref <50)
Oxymorphone: NEGATIVE ng/mL (ref <50)

## 2015-05-18 LAB — FENTANYL (GC/LC/MS), URINE
FENTANYL (GC/MS) CONFIRM: NEGATIVE ng/mL (ref <0.5)
NORFENTANYL (GC/MS) CONFIRM: NEGATIVE ng/mL (ref <0.5)

## 2015-05-19 LAB — PRESCRIPTION MONITORING PROFILE (SOLSTAS)
Amphetamine/Meth: NEGATIVE ng/mL
Barbiturate Screen, Urine: NEGATIVE ng/mL
Benzodiazepine Screen, Urine: NEGATIVE ng/mL
Buprenorphine, Urine: NEGATIVE ng/mL
CANNABINOID SCRN UR: NEGATIVE ng/mL
CARISOPRODOL, URINE: NEGATIVE ng/mL
COCAINE METABOLITES: NEGATIVE ng/mL
CREATININE, URINE: 138.54 mg/dL (ref >20.0)
ECSTASY: NEGATIVE ng/mL
Meperidine, Ur: NEGATIVE ng/mL
Methadone Screen, Urine: NEGATIVE ng/mL
Nitrites, Initial: NEGATIVE ug/mL
Oxycodone Screen, Ur: NEGATIVE ng/mL
PH URINE, INITIAL: 5.6 pH (ref 4.5–8.9)
Propoxyphene: NEGATIVE ng/mL
Tapentadol, urine: NEGATIVE ng/mL
Tramadol Scrn, Ur: NEGATIVE ng/mL
ZOLPIDEM, URINE: NEGATIVE ng/mL

## 2015-05-30 NOTE — Progress Notes (Signed)
Urine drug screen for this encounter is consistent for prescribed medication 

## 2015-06-07 ENCOUNTER — Encounter: Payer: Self-pay | Admitting: Vascular Surgery

## 2015-06-13 ENCOUNTER — Other Ambulatory Visit: Payer: Self-pay | Admitting: *Deleted

## 2015-06-13 DIAGNOSIS — M79642 Pain in left hand: Secondary | ICD-10-CM

## 2015-06-14 ENCOUNTER — Encounter: Payer: Self-pay | Admitting: Vascular Surgery

## 2015-06-14 ENCOUNTER — Ambulatory Visit (INDEPENDENT_AMBULATORY_CARE_PROVIDER_SITE_OTHER): Payer: Medicare Other | Admitting: Vascular Surgery

## 2015-06-14 ENCOUNTER — Ambulatory Visit (HOSPITAL_COMMUNITY)
Admission: RE | Admit: 2015-06-14 | Discharge: 2015-06-14 | Disposition: A | Discharge status: Home / Self Care | Payer: Medicare Other | Source: Ambulatory Visit | Attending: Vascular Surgery | Admitting: Vascular Surgery

## 2015-06-14 ENCOUNTER — Other Ambulatory Visit: Payer: Self-pay | Admitting: Vascular Surgery

## 2015-06-14 VITALS — BP 126/68 | HR 62 | Temp 98.5°F | Resp 16 | Ht 65.0 in | Wt 203.0 lb

## 2015-06-14 DIAGNOSIS — M79642 Pain in left hand: Secondary | ICD-10-CM

## 2015-06-14 DIAGNOSIS — I10 Essential (primary) hypertension: Secondary | ICD-10-CM | POA: Diagnosis not present

## 2015-06-14 DIAGNOSIS — R938 Abnormal findings on diagnostic imaging of other specified body structures: Secondary | ICD-10-CM | POA: Diagnosis not present

## 2015-06-14 DIAGNOSIS — N184 Chronic kidney disease, stage 4 (severe): Secondary | ICD-10-CM | POA: Insufficient documentation

## 2015-06-14 DIAGNOSIS — T82510A Breakdown (mechanical) of surgically created arteriovenous fistula, initial encounter: Secondary | ICD-10-CM | POA: Diagnosis not present

## 2015-06-14 DIAGNOSIS — Y832 Surgical operation with anastomosis, bypass or graft as the cause of abnormal reaction of the patient, or of later complication, without mention of misadventure at the time of the procedure: Secondary | ICD-10-CM | POA: Diagnosis not present

## 2015-06-14 DIAGNOSIS — T82590A Other mechanical complication of surgically created arteriovenous fistula, initial encounter: Secondary | ICD-10-CM

## 2015-06-14 DIAGNOSIS — E1140 Type 2 diabetes mellitus with diabetic neuropathy, unspecified: Secondary | ICD-10-CM | POA: Insufficient documentation

## 2015-06-14 NOTE — Progress Notes (Signed)
Filed Vitals:   06/14/15 1028 06/14/15 1034  BP: 143/70 126/68  Pulse: 61 62  Temp: 98.5 F (36.9 C)   TempSrc: Oral   Resp: 16   Height: 5\' 5"  (1.651 m)   Weight: 203 lb (92.08 kg)   SpO2: 97%

## 2015-06-14 NOTE — Progress Notes (Signed)
Subjective:     Patient ID: Paige Arnold, female   DOB: August 14, 1940, 74 y.o.   MRN: [id number]  HPI This 74 year old female with chronic kidney disease stage IV returns today for continued follow-up about her mild steal syndrome in the left upper extremity following left brachial-cephalic AV fistula creation which I performed on February 16 2015. She continues to complain of some cold sensation in the left hand but denies pain or numbness. She does have good strength in her left hand she states. Her symptoms have not changed much since last saw her 8 weeks ago. She will be seeing Dr. Lorrene Reid in the near future.  Past Medical History  Diagnosis Date  . Thyroid disorder   . Kidney disease   . Arthritis   . Hypertension   . Diabetes mellitus   . Shortness of breath dyspnea     when I first lay down, then it gets better  . Sleep apnea     does not wear CPAP   . GERD (gastroesophageal reflux disease)   . Diabetic neuropathy ([place])   . Pneumonia Oct. 2016    Social History  Substance Use Topics  . Smoking status: Never Smoker   . Smokeless tobacco: Never Used  . Alcohol Use: No    Family History  Problem Relation Age of Onset  . Kidney disease Mother   . Heart disease Father   . Diabetes Brother   . Alcohol abuse Brother   . Depression Brother   . Sarcoidosis Brother   . ALS Brother     Deceased, 32s  . Heart disease Brother   . Bipolar disorder Daughter     Allergies  Allergen Reactions  . Sulfa Antibiotics Itching     Current outpatient prescriptions:  .  amLODipine (NORVASC) 10 MG tablet, Take 10 mg by mouth daily., Disp: , Rfl:  .  aspirin 81 MG tablet, Take 81 mg by mouth daily., Disp: , Rfl:  .  cloNIDine (CATAPRES) 0.1 MG tablet, Take 0.1-0.2 mg by mouth 2 (two) times daily. 2 tablets in the morning, 1 tablet in the evening, Disp: , Rfl:  .  dicyclomine (BENTYL) 20 MG tablet, Take 20 mg by mouth every 6 (six) hours as needed for spasms., Disp: , Rfl:  .  divalproex  (DEPAKOTE ER) 250 MG 24 hr tablet, Take 500 mg by mouth daily. 2 every morning, Disp: , Rfl:  .  donepezil (ARICEPT) 10 MG tablet, Take 1 tablet (10 mg total) by mouth at bedtime. 1 q hs, Disp: 30 tablet, Rfl: 11 .  Dulaglutide (TRULICITY) 1.5 0000000 SOPN, Inject 1.5 mg into the skin once a week. Saturday, Disp: , Rfl:  .  DULoxetine (CYMBALTA) 30 MG capsule, Take 30 mg by mouth daily., Disp: , Rfl:  .  furosemide (LASIX) 40 MG tablet, Take 1 tablet (40 mg total) by mouth 2 (two) times daily. (Patient taking differently: Take 80 mg by mouth 2 (two) times daily. ), Disp: 60 tablet, Rfl: 0 .  gabapentin (NEURONTIN) 300 MG capsule, Take 1 tablet in the morning and 2 tablets. (Patient taking differently: Take 300-600 mg by mouth 2 (two) times daily. Take 1 tablet in the morning and 2 tablets in the evening), Disp: 90 capsule, Rfl: 11 .  gabapentin (NEURONTIN) 400 MG capsule, Take 800 mg by mouth at bedtime., Disp: , Rfl:  .  levothyroxine (SYNTHROID) 150 MCG tablet, Take 1 tablet (150 mcg total) by mouth daily before breakfast., Disp: 30 tablet, Rfl:  11 .  lidocaine (LIDODERM) 5 %, Place 1 patch onto the skin every 12 (twelve) hours. Remove & Discard patch within 12 hours or as directed, Disp: 30 patch, Rfl: 4 .  morphine (MSIR) 15 MG tablet, Take 1 tablet (15 mg total) by mouth 3 (three) times daily., Disp: 90 tablet, Rfl: 0 .  phenazopyridine (PYRIDIUM) 100 MG tablet, Take 100 mg by mouth 3 (three) times daily as needed for pain., Disp: , Rfl:  .  pravastatin (PRAVACHOL) 40 MG tablet, Take 40 mg by mouth daily., Disp: , Rfl:  .  propranolol (INDERAL) 20 MG tablet, Take 20 mg by mouth 2 (two) times daily., Disp: , Rfl: 3 .  sitaGLIPtin (JANUVIA) 100 MG tablet, Take 50 mg by mouth daily. , Disp: , Rfl:   Filed Vitals:   06/14/15 1028 06/14/15 1034  BP: 143/70 126/68  Pulse: 61 62  Temp: 98.5 F (36.9 C)   TempSrc: Oral   Resp: 16   Height: 5\' 5"  (1.651 m)   Weight: 203 lb (92.08 kg)   SpO2:  97%     Body mass index is 33.78 kg/(m^2).             Review of Systems Denies chest pain or hemoptysis. Does have dyspnea on exertion and edema.     Objective:   Physical Exam BP 126/68 mmHg  Pulse 62  Temp(Src) 98.5 F (36.9 C) (Oral)  Resp 16  Ht 5\' 5"  (1.651 m)  Wt 203 lb (92.08 kg)  BMI 33.78 kg/m2  SpO2 97%   Gen. Well-developed well-nourished female in no apparent distress alert and oriented 3 Lungs no rhonchi or wheezingCardiovascular regular rhythm no murmursLeft upper extremity was excellent pulse and palpable thrill in brachialcephalic AV fistula. 1-2+ radial pulse palpable with fistula patent which improved to 3+ with compression of the fistula. Left hand is pink and well perfused and does not appear ischemic on physical exam.  Today I ordered a Doppler study of the left upper extremity which revealed that there is 27 mm gradient between the right and left upper extremities in the left upper extremity radial blood pressure increases 34 mm with compression of the fistula. It is however 99991111 mm systolic at rest which improves to 148 with compression     Assessment:      mild steel syndrome left upper extremity following brachial-cephalic AV fistula area at symptoms are very tolerable the present time with only cold sensation but no pain or numbness     Plan:      patient will be in touch with me if her symptoms progress or she has difficulty sleeping at night because of hand symptoms.   She will be seeing Dr. Lorrene Reid in the near future and patient currently is not on hemodialysis

## 2015-06-15 ENCOUNTER — Encounter: Payer: Worker's Compensation | Admitting: Registered Nurse

## 2015-06-15 ENCOUNTER — Encounter: Payer: Self-pay | Admitting: Registered Nurse

## 2015-06-15 ENCOUNTER — Encounter: Payer: Worker's Compensation | Attending: Registered Nurse | Admitting: Registered Nurse

## 2015-06-15 VITALS — BP 129/60 | HR 88 | Resp 14

## 2015-06-15 DIAGNOSIS — M47817 Spondylosis without myelopathy or radiculopathy, lumbosacral region: Secondary | ICD-10-CM | POA: Diagnosis not present

## 2015-06-15 DIAGNOSIS — M79642 Pain in left hand: Secondary | ICD-10-CM | POA: Insufficient documentation

## 2015-06-15 DIAGNOSIS — M1990 Unspecified osteoarthritis, unspecified site: Secondary | ICD-10-CM | POA: Diagnosis not present

## 2015-06-15 DIAGNOSIS — M79641 Pain in right hand: Secondary | ICD-10-CM | POA: Diagnosis not present

## 2015-06-15 DIAGNOSIS — E1140 Type 2 diabetes mellitus with diabetic neuropathy, unspecified: Secondary | ICD-10-CM | POA: Diagnosis not present

## 2015-06-15 DIAGNOSIS — M25562 Pain in left knee: Secondary | ICD-10-CM | POA: Diagnosis not present

## 2015-06-15 DIAGNOSIS — Z79899 Other long term (current) drug therapy: Secondary | ICD-10-CM

## 2015-06-15 DIAGNOSIS — M79672 Pain in left foot: Secondary | ICD-10-CM | POA: Insufficient documentation

## 2015-06-15 DIAGNOSIS — E1149 Type 2 diabetes mellitus with other diabetic neurological complication: Secondary | ICD-10-CM

## 2015-06-15 DIAGNOSIS — M4800 Spinal stenosis, site unspecified: Secondary | ICD-10-CM | POA: Diagnosis not present

## 2015-06-15 DIAGNOSIS — Z5181 Encounter for therapeutic drug level monitoring: Secondary | ICD-10-CM

## 2015-06-15 DIAGNOSIS — I10 Essential (primary) hypertension: Secondary | ICD-10-CM | POA: Diagnosis not present

## 2015-06-15 DIAGNOSIS — M79671 Pain in right foot: Secondary | ICD-10-CM | POA: Diagnosis not present

## 2015-06-15 DIAGNOSIS — N289 Disorder of kidney and ureter, unspecified: Secondary | ICD-10-CM | POA: Insufficient documentation

## 2015-06-15 DIAGNOSIS — M545 Low back pain: Secondary | ICD-10-CM | POA: Insufficient documentation

## 2015-06-15 DIAGNOSIS — Z760 Encounter for issue of repeat prescription: Secondary | ICD-10-CM | POA: Diagnosis present

## 2015-06-15 DIAGNOSIS — G8929 Other chronic pain: Secondary | ICD-10-CM | POA: Diagnosis not present

## 2015-06-15 DIAGNOSIS — E079 Disorder of thyroid, unspecified: Secondary | ICD-10-CM | POA: Diagnosis not present

## 2015-06-15 DIAGNOSIS — G894 Chronic pain syndrome: Secondary | ICD-10-CM

## 2015-06-15 MED ORDER — MORPHINE SULFATE 15 MG PO TABS: 15.0000 mg | ORAL_TABLET | Freq: Three times a day (TID) | ORAL | Status: DC

## 2015-06-15 NOTE — Patient Instructions (Signed)
Increase Gabapentin 300 mg  one tablet four times a day.  Call office in a week to evaluate medication changes  [phone]

## 2015-06-15 NOTE — Progress Notes (Signed)
Subjective:    Patient ID: Paige Arnold, female    DOB: May 28, 1941, 74 y.o.   MRN: [id number]  HPI: Paige Arnold is a 74 year old female who returns for follow up for chronic pain and medication refill. She says her pain is located in her lower back radiating into left hip and left lower extremity anteriorly, also left knee and bilateral feet. She rates her pain 4. Her current exercise regime is walking short distances with cane she uses her walker in the home.  Pain Inventory Average Pain 4 Pain Right Now 4 My pain is sharp, burning, dull, stabbing, tingling and aching  In the last 24 hours, has pain interfered with the following? General activity 2 Relation with others 1 Enjoyment of life 2 What TIME of day is your pain at its worst? morning, daytime, evening, night Sleep (in general) Fair  Pain is worse with: walking, bending, standing and some activites Pain improves with: rest, heat/ice and medication Relief from Meds: 5  Mobility use a cane ability to climb steps?  yes do you drive?  no Do you have any goals in this area?  no  Function disabled: date disabled na retired I need assistance with the following:  meal prep, household duties and shopping  Neuro/Psych weakness numbness tremor tingling trouble walking dizziness anxiety loss of taste or smell  Prior Studies Any changes since last visit?  no  Physicians involved in your care Any changes since last visit?  no Primary care . Neurologist .   Family History  Problem Relation Age of Onset  . Kidney disease Mother   . Heart disease Father   . Diabetes Brother   . Alcohol abuse Brother   . Depression Brother   . Sarcoidosis Brother   . ALS Brother     Deceased, 82s  . Heart disease Brother   . Bipolar disorder Daughter    Social History   Social History  . Marital Status: Married    Spouse Name: N/A  . Number of Children: 3  . Years of Education: N/A   Occupational History  .  retired    Social History Main Topics  . Smoking status: Never Smoker   . Smokeless tobacco: Never Used  . Alcohol Use: No  . Drug Use: No  . Sexual Activity: Yes   Other Topics Concern  . None   Social History Narrative   She lives with husband and daughter.  Three grown children.   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     She took early retired at 77 because of problems of RSD.   Highest level of education:  Graduated high school      Past Surgical History  Procedure Laterality Date  . Tubal ligation    . Temporomandibular joint surgery    . Partial knee arthroplasty      left  . Cholecystectomy    . Intraocular lens insertion Right 10-21-13  . Partial hip arthroplasty    . Tonsillectomy    . Av fistula placement Left 02/16/2015    Procedure: LEFT ARM BRACHIOCEPHALIC (AV) FISTULA CREATION;  Surgeon: Mal Misty, MD;  Location: [place];  Service: Vascular;  Laterality: Left;  . Ligation of competing branches of arteriovenous fistula Left 02/16/2015    Procedure: LIGATION OF COMPETING BRANCHES OF LEFT BRACHIOCEPHALIC ARTERIOVENOUS FISTULA;  Surgeon: Mal Misty, MD;  Location: [place];  Service: Vascular;  Laterality: Left;   Past  Medical History  Diagnosis Date  . Thyroid disorder   . Kidney disease   . Arthritis   . Hypertension   . Diabetes mellitus   . Shortness of breath dyspnea     when I first lay down, then it gets better  . Sleep apnea     does not wear CPAP   . GERD (gastroesophageal reflux disease)   . Diabetic neuropathy ([place])   . Pneumonia Oct. 2016   BP 129/60 mmHg  Pulse 88  Resp 14  SpO2 96%  Opioid Risk Score:   Fall Risk Score:  `1  Depression screen PHQ 2/9  Depression screen [facility] 2/9 02/03/2015 10/01/2014  Decreased Interest 1 1  Down, Depressed, Hopeless 2 2  PHQ - 2 Score 3 3  Altered sleeping 2 2  Tired, decreased energy 2 2  Change in appetite 1 1  Feeling bad or failure about yourself  0 0  Trouble  concentrating 1 1  Moving slowly or fidgety/restless 1 1  Suicidal thoughts 0 0  PHQ-9 Score 10 10     Review of Systems  Constitutional: Positive for diaphoresis and appetite change.       Loss of taste or smell  Respiratory: Positive for shortness of breath.   Gastrointestinal: Positive for abdominal pain and constipation.  Endocrine:       High blood sugar  Genitourinary: Positive for difficulty urinating.  Musculoskeletal: Positive for gait problem.  Neurological: Positive for dizziness, tremors, weakness and numbness.       Tingling  Psychiatric/Behavioral: The patient is nervous/anxious.   All other systems reviewed and are negative.      Objective:   Physical Exam  Constitutional: She is oriented to person, place, and time. She appears well-developed and well-nourished.  HENT:  Head: Normocephalic and atraumatic.  Neck: Normal range of motion. Neck supple.  Musculoskeletal:  Normal Muscle Bulk and Muscle Testing Reveals: Upper Extremities: Full ROM and Muscle Strength 5/5 Thoracic Paraspinal Tenderness: T-1- T-3 Lumbar Paraspinal Tenderness: L-3- L-5 Lower Extremities: Full ROM and Muscle Strength 5/5 Left Lower Extremity Flexion Produces pain into Left Hip Arises from chair slowly using straight cane for support Narrow Based Gait  Neurological: She is alert and oriented to person, place, and time.  Skin: Skin is warm and dry.  Psychiatric: She has a normal mood and affect.  Nursing note and vitals reviewed.         Assessment & Plan:  1. Spinal stenosis chronic low back pain radiating into the legs: Continue MSIR 15 mg take one tablet by mouth three times daily #90. 2. Diabetic Neuropathy: Increase Gabapentin to QID: Call office to evaluate medication changes 3. Left Knee Pain: Continue Current Medication and exercise Regimen. ( RSD) Continue Lidoderm Patches. 4. Left Hip Fracture S/P hemiarthroplasty: Orthopedist [facility]oll[facility] at [facility]  20  minutes of face to face patient care time was spent during this visit. All questions were encouraged and answered.   F/U in 1 month

## 2015-07-13 ENCOUNTER — Encounter: Payer: Worker's Compensation | Attending: Registered Nurse | Admitting: Registered Nurse

## 2015-07-13 ENCOUNTER — Encounter: Payer: Self-pay | Admitting: Registered Nurse

## 2015-07-13 VITALS — BP 144/78 | HR 60

## 2015-07-13 DIAGNOSIS — M79641 Pain in right hand: Secondary | ICD-10-CM | POA: Diagnosis not present

## 2015-07-13 DIAGNOSIS — I10 Essential (primary) hypertension: Secondary | ICD-10-CM | POA: Diagnosis not present

## 2015-07-13 DIAGNOSIS — G8929 Other chronic pain: Secondary | ICD-10-CM | POA: Diagnosis not present

## 2015-07-13 DIAGNOSIS — Z5181 Encounter for therapeutic drug level monitoring: Secondary | ICD-10-CM

## 2015-07-13 DIAGNOSIS — M25562 Pain in left knee: Secondary | ICD-10-CM | POA: Insufficient documentation

## 2015-07-13 DIAGNOSIS — N289 Disorder of kidney and ureter, unspecified: Secondary | ICD-10-CM | POA: Insufficient documentation

## 2015-07-13 DIAGNOSIS — M79672 Pain in left foot: Secondary | ICD-10-CM | POA: Diagnosis not present

## 2015-07-13 DIAGNOSIS — Z79899 Other long term (current) drug therapy: Secondary | ICD-10-CM

## 2015-07-13 DIAGNOSIS — E1149 Type 2 diabetes mellitus with other diabetic neurological complication: Secondary | ICD-10-CM

## 2015-07-13 DIAGNOSIS — E1140 Type 2 diabetes mellitus with diabetic neuropathy, unspecified: Secondary | ICD-10-CM | POA: Insufficient documentation

## 2015-07-13 DIAGNOSIS — M79671 Pain in right foot: Secondary | ICD-10-CM | POA: Insufficient documentation

## 2015-07-13 DIAGNOSIS — G894 Chronic pain syndrome: Secondary | ICD-10-CM

## 2015-07-13 DIAGNOSIS — M4800 Spinal stenosis, site unspecified: Secondary | ICD-10-CM | POA: Diagnosis not present

## 2015-07-13 DIAGNOSIS — M545 Low back pain: Secondary | ICD-10-CM | POA: Diagnosis not present

## 2015-07-13 DIAGNOSIS — E079 Disorder of thyroid, unspecified: Secondary | ICD-10-CM | POA: Insufficient documentation

## 2015-07-13 DIAGNOSIS — M79642 Pain in left hand: Secondary | ICD-10-CM | POA: Diagnosis not present

## 2015-07-13 DIAGNOSIS — M1990 Unspecified osteoarthritis, unspecified site: Secondary | ICD-10-CM | POA: Diagnosis not present

## 2015-07-13 DIAGNOSIS — M47817 Spondylosis without myelopathy or radiculopathy, lumbosacral region: Secondary | ICD-10-CM | POA: Diagnosis not present

## 2015-07-13 DIAGNOSIS — Z760 Encounter for issue of repeat prescription: Secondary | ICD-10-CM | POA: Diagnosis not present

## 2015-07-13 MED ORDER — MORPHINE SULFATE 15 MG PO TABS: 15.0000 mg | ORAL_TABLET | Freq: Three times a day (TID) | ORAL | Status: DC

## 2015-07-13 NOTE — Progress Notes (Signed)
Subjective:    Patient ID: Paige Arnold, female    DOB: Feb 13, 1941, 74 y.o.   MRN: [id number]  HPI: [name] is a 74 year old female who returns for follow up for chronic pain and medication refill. She says her pain is located in her lower back and left knee. She rates her pain 4. Her current exercise regime is walking short distances with cane she uses her walker in the home.  Pain Inventory Average Pain 4 Pain Right Now 4 My pain is sharp, stabbing and aching  In the last 24 hours, has pain interfered with the following? General activity 3 Relation with others 1 Enjoyment of life 2 What TIME of day is your pain at its worst? Morning, Daytime, Evening and Night Sleep (in general) NA  Pain is worse with: walking, bending and standing Pain improves with: rest, heat/ice and medication Relief from Meds: 4  Mobility walk with assistance use a cane ability to climb steps?  yes do you drive?  no Do you have any goals in this area?  no  Function disabled: date disabled NA I need assistance with the following:  meal prep, household duties and shopping Do you have any goals in this area?  no  Neuro/Psych tremor tingling trouble walking dizziness anxiety loss of taste or smell  Prior Studies Any changes since last visit?  no  Physicians involved in your care Any changes since last visit?  no   Family History  Problem Relation Age of Onset  . Kidney disease Mother   . Heart disease Father   . Diabetes Brother   . Alcohol abuse Brother   . Depression Brother   . Sarcoidosis Brother   . ALS Brother     Deceased, 39s  . Heart disease Brother   . Bipolar disorder Daughter    Social History   Social History  . Marital Status: Married    Spouse Name: N/A  . Number of Children: 3  . Years of Education: N/A   Occupational History  . retired    Social History Main Topics  . Smoking status: Never Smoker   . Smokeless tobacco: Never Used  . Alcohol  Use: No  . Drug Use: No  . Sexual Activity: Yes   Other Topics Concern  . None   Social History Narrative   She lives with husband and daughter.  Three grown children.   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     She took early retired at 69 because of problems of RSD.   Highest level of education:  Graduated high school      Past Surgical History  Procedure Laterality Date  . Tubal ligation    . Temporomandibular joint surgery    . Partial knee arthroplasty      left  . Cholecystectomy    . Intraocular lens insertion Right 10-21-13  . Partial hip arthroplasty    . Tonsillectomy    . Av fistula placement Left 02/16/2015    Procedure: LEFT ARM BRACHIOCEPHALIC (AV) FISTULA CREATION;  Surgeon: Mal Misty, MD;  Location: [place];  Service: Vascular;  Laterality: Left;  . Ligation of competing branches of arteriovenous fistula Left 02/16/2015    Procedure: LIGATION OF COMPETING BRANCHES OF LEFT BRACHIOCEPHALIC ARTERIOVENOUS FISTULA;  Surgeon: Mal Misty, MD;  Location: [place];  Service: Vascular;  Laterality: Left;   Past Medical History  Diagnosis Date  . Thyroid disorder   .  Kidney disease   . Arthritis   . Hypertension   . Diabetes mellitus   . Shortness of breath dyspnea     when I first lay down, then it gets better  . Sleep apnea     does not wear CPAP   . GERD (gastroesophageal reflux disease)   . Diabetic neuropathy ([place])   . Pneumonia Oct. 2016   BP 144/78 mmHg  Pulse 60  SpO2 96%  Opioid Risk Score:   Fall Risk Score:  `1  Depression screen PHQ 2/9  Depression screen [facility] 2/9 02/03/2015 10/01/2014  Decreased Interest 1 1  Down, Depressed, Hopeless 2 2  PHQ - 2 Score 3 3  Altered sleeping 2 2  Tired, decreased energy 2 2  Change in appetite 1 1  Feeling bad or failure about yourself  0 0  Trouble concentrating 1 1  Moving slowly or fidgety/restless 1 1  Suicidal thoughts 0 0  PHQ-9 Score 10 10      Review of Systems    Gastrointestinal: Positive for abdominal pain and constipation.  Endocrine:       High Blood Sugar  Genitourinary:       Painful Urination  Neurological: Positive for dizziness and tremors.       Tingling  Psychiatric/Behavioral: The patient is nervous/anxious.        Loss of taste or smell  All other systems reviewed and are negative.      Objective:   Physical Exam  Constitutional: She is oriented to person, place, and time. She appears well-developed and well-nourished.  HENT:  Head: Normocephalic and atraumatic.  Neck: Normal range of motion. Neck supple.  Cervical Paraspinal Tenderness: C-5- C-6  Cardiovascular: Normal rate and regular rhythm.   Pulmonary/Chest: Effort normal and breath sounds normal.  Musculoskeletal: She exhibits edema.  Normal Muscle Bulk and Muscle Testing Reveals: Upper Extremities: Full ROM and Muscle Strength 5/5 Thoracic Paraspinal Tenderness: T-7- T-9 Lumbar Paraspinal Tenderness: L-3- L-5 Lower Extremities: Full ROM and Muscle Strength 5/5 Arises from chair slowly using straight cane for support Narrow Based Gait  Neurological: She is alert and oriented to person, place, and time.  Skin: Skin is warm and dry.  Psychiatric: She has a normal mood and affect.  Nursing note and vitals reviewed.         Assessment & Plan:  1. Spinal stenosis chronic low back pain radiating into the legs: Continue MSIR 15 mg take one tablet by mouth three times daily #90. 2. Diabetic Neuropathy: Continue Gabapentin   3. Left Knee Pain: Continue Current Medication and exercise Regimen. ( RSD) Continue Lidoderm Patches. 4. Left Hip Fracture S/P hemiarthroplasty: Orthopedist Follwing.  20 minutes of face to face patient care time was spent during this visit. All questions were encouraged and answered.   F/U in 1 month

## 2015-08-04 ENCOUNTER — Ambulatory Visit: Payer: Self-pay | Admitting: Neurology

## 2015-08-12 ENCOUNTER — Encounter: Payer: Worker's Compensation | Attending: Physical Medicine & Rehabilitation

## 2015-08-12 ENCOUNTER — Ambulatory Visit (HOSPITAL_BASED_OUTPATIENT_CLINIC_OR_DEPARTMENT_OTHER): Payer: Worker's Compensation | Admitting: Physical Medicine & Rehabilitation

## 2015-08-12 ENCOUNTER — Encounter: Payer: Self-pay | Admitting: Physical Medicine & Rehabilitation

## 2015-08-12 VITALS — BP 117/68 | HR 73 | Resp 16

## 2015-08-12 DIAGNOSIS — Z9660 Presence of unspecified orthopedic joint implant: Secondary | ICD-10-CM | POA: Insufficient documentation

## 2015-08-12 DIAGNOSIS — M25562 Pain in left knee: Secondary | ICD-10-CM

## 2015-08-12 DIAGNOSIS — G4730 Sleep apnea, unspecified: Secondary | ICD-10-CM | POA: Insufficient documentation

## 2015-08-12 DIAGNOSIS — G8929 Other chronic pain: Secondary | ICD-10-CM | POA: Diagnosis present

## 2015-08-12 DIAGNOSIS — G8928 Other chronic postprocedural pain: Secondary | ICD-10-CM | POA: Diagnosis not present

## 2015-08-12 DIAGNOSIS — M48061 Spinal stenosis, lumbar region without neurogenic claudication: Secondary | ICD-10-CM

## 2015-08-12 DIAGNOSIS — I10 Essential (primary) hypertension: Secondary | ICD-10-CM | POA: Diagnosis not present

## 2015-08-12 DIAGNOSIS — E079 Disorder of thyroid, unspecified: Secondary | ICD-10-CM | POA: Insufficient documentation

## 2015-08-12 DIAGNOSIS — N289 Disorder of kidney and ureter, unspecified: Secondary | ICD-10-CM | POA: Diagnosis not present

## 2015-08-12 DIAGNOSIS — Z96649 Presence of unspecified artificial hip joint: Secondary | ICD-10-CM

## 2015-08-12 DIAGNOSIS — K219 Gastro-esophageal reflux disease without esophagitis: Secondary | ICD-10-CM | POA: Diagnosis not present

## 2015-08-12 DIAGNOSIS — M4806 Spinal stenosis, lumbar region: Secondary | ICD-10-CM

## 2015-08-12 DIAGNOSIS — E1140 Type 2 diabetes mellitus with diabetic neuropathy, unspecified: Secondary | ICD-10-CM | POA: Insufficient documentation

## 2015-08-12 DIAGNOSIS — M545 Low back pain: Secondary | ICD-10-CM | POA: Insufficient documentation

## 2015-08-12 MED ORDER — DIAZEPAM 5 MG PO TABS: 5.0000 mg | ORAL_TABLET | Freq: Once | ORAL | Status: DC

## 2015-08-12 MED ORDER — MORPHINE SULFATE 15 MG PO TABS: 15.0000 mg | ORAL_TABLET | Freq: Three times a day (TID) | ORAL | Status: DC

## 2015-08-12 NOTE — Patient Instructions (Signed)
We will try a different type of spine injection called medial branch block. This is to help with back pain related to back arthritis. If it is not helpful we may consider a surgical referral

## 2015-08-12 NOTE — Progress Notes (Signed)
Subjective:    Patient ID: Paige Arnold, female    DOB: 1941-01-26, 75 y.o.   MRN: [id number]  HPI Chief complaint is low back pain 75 year old female with history of lumbar spinal stenosis. She has an L3-L4 disc Protruding toward the left side. A left L3-L4 transforaminal epidural was performed in 2014 but was not helpful. Patient continues have numbness in the left thigh. She had a left total hip replacement after a hip fracture 1-2 years ago. Patient's numbness did not improve after the hip replacement. She is complaining more of her low back then her left thigh at the current time it still gets episodes of left thigh pain and numbness throughout the day.  Has chronic bladder burning sensations. She is also chronically constipated. Has about 2 bowel movements per week She also has chronic kidney disease and has an AV graft but is not undergoing dialysis at the current time                        Pain Inventory Average Pain 5 Pain Right Now 4 My pain is sharp, burning, dull, stabbing, tingling and aching  In the last 24 hours, has pain interfered with the following? General activity 2 Relation with others 2 Enjoyment of life 1 What TIME of day is your pain at its worst? morning, daytime, evening, night Sleep (in general) Fair  Pain is worse with: walking, bending, standing and some activites Pain improves with: rest and heat/ice Relief from Meds: 5  Mobility use a cane ability to climb steps?  yes do you drive?  no  Function retired I need assistance with the following:  meal prep, household duties and shopping  Neuro/Psych No problems in this area  Prior Studies Any changes since last visit?  no  Physicians involved in your care Any changes since last visit?  no   Family History  Problem Relation Age of Onset  . Kidney disease Mother   . Heart disease Father   . Diabetes Brother   . Alcohol abuse Brother   . Depression Brother   . Sarcoidosis Brother   . ALS  Brother     Deceased, 103s  . Heart disease Brother   . Bipolar disorder Daughter    Social History   Social History  . Marital Status: Married    Spouse Name: N/A  . Number of Children: 3  . Years of Education: N/A   Occupational History  . retired    Social History Main Topics  . Smoking status: Never Smoker   . Smokeless tobacco: Never Used  . Alcohol Use: No  . Drug Use: No  . Sexual Activity: Yes   Other Topics Concern  . None   Social History Narrative   She lives with husband and daughter.  Three grown children.   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     She took early retired at 38 because of problems of RSD.   Highest level of education:  Graduated high school      Past Surgical History  Procedure Laterality Date  . Tubal ligation    . Temporomandibular joint surgery    . Partial knee arthroplasty      left  . Cholecystectomy    . Intraocular lens insertion Right 10-21-13  . Partial hip arthroplasty    . Tonsillectomy    . Av fistula placement Left 02/16/2015    Procedure: LEFT ARM BRACHIOCEPHALIC (AV)  FISTULA CREATION;  Surgeon: Mal Misty, MD;  Location: [place];  Service: Vascular;  Laterality: Left;  . Ligation of competing branches of arteriovenous fistula Left 02/16/2015    Procedure: LIGATION OF COMPETING BRANCHES OF LEFT BRACHIOCEPHALIC ARTERIOVENOUS FISTULA;  Surgeon: Mal Misty, MD;  Location: [place];  Service: Vascular;  Laterality: Left;   Past Medical History  Diagnosis Date  . Thyroid disorder   . Kidney disease   . Arthritis   . Hypertension   . Diabetes mellitus   . Shortness of breath dyspnea     when I first lay down, then it gets better  . Sleep apnea     does not wear CPAP   . GERD (gastroesophageal reflux disease)   . Diabetic neuropathy ([place])   . Pneumonia Oct. 2016   BP 117/68 mmHg  Pulse 73  Resp 16  SpO2 95%  Opioid Risk Score:   Fall Risk Score:  `1  Depression screen PHQ 2/9  Depression  screen [facility] 2/9 02/03/2015 10/01/2014  Decreased Interest 1 1  Down, Depressed, Hopeless 2 2  PHQ - 2 Score 3 3  Altered sleeping 2 2  Tired, decreased energy 2 2  Change in appetite 1 1  Feeling bad or failure about yourself  0 0  Trouble concentrating 1 1  Moving slowly or fidgety/restless 1 1  Suicidal thoughts 0 0  PHQ-9 Score 10 10     Review of Systems  Constitutional: Positive for diaphoresis.  Endocrine:       High blood sugar  Genitourinary: Positive for dysuria and difficulty urinating.  All other systems reviewed and are negative.      Objective:   Physical Exam  Constitutional: She is oriented to person, place, and time. She appears well-developed and well-nourished.  HENT:  Head: Normocephalic and atraumatic.  Eyes: Conjunctivae and EOM are normal. Pupils are equal, round, and reactive to light.  Neck: Normal range of motion.  Musculoskeletal:  Soft tissue defect around left anterior hip incision There is numbness around the incision but also along the lateral thigh  Hyper sensitive to touch around the left knee as well  Neurological: She is alert and oriented to person, place, and time. A sensory deficit is present.  Motor strength is 5/5 bilateral hip flexor and knee extensor and ankle dorsiflexors.  Psychiatric: She has a normal mood and affect.  Nursing note and vitals reviewed.         Assessment & Plan:  1. Lumbar spinal stenosis, L3-L4 foraminal stenosis due to disc as well as degenerative changes, lumbar facet arthrosis. Did not respond to left L3-4 transforaminal injection. If thigh discomfort worsens and develops some weakness, would recommend surgical referral 2. Lumbar spondylosis mid and lower lumbar area will schedule for L3 L4 L5 lumbar medial branch blocks.Valium 5 mg prior to injection  3. Chronic postoperative pain left hip as well as left knee. Continue morphine sulfate 15 mg extended release 3 times per day.  Over half of the 25 min  visit was spent counseling and coordinating care.

## 2015-09-05 ENCOUNTER — Encounter: Payer: Medicare Other | Attending: Physical Medicine & Rehabilitation

## 2015-09-05 ENCOUNTER — Ambulatory Visit (HOSPITAL_BASED_OUTPATIENT_CLINIC_OR_DEPARTMENT_OTHER): Payer: Medicare Other | Admitting: Physical Medicine & Rehabilitation

## 2015-09-05 ENCOUNTER — Encounter: Payer: Self-pay | Admitting: Physical Medicine & Rehabilitation

## 2015-09-05 VITALS — BP 128/40 | HR 60

## 2015-09-05 DIAGNOSIS — G8929 Other chronic pain: Secondary | ICD-10-CM | POA: Diagnosis present

## 2015-09-05 DIAGNOSIS — Z9660 Presence of unspecified orthopedic joint implant: Secondary | ICD-10-CM | POA: Diagnosis not present

## 2015-09-05 DIAGNOSIS — G894 Chronic pain syndrome: Secondary | ICD-10-CM | POA: Diagnosis not present

## 2015-09-05 DIAGNOSIS — M545 Low back pain: Secondary | ICD-10-CM | POA: Diagnosis present

## 2015-09-05 DIAGNOSIS — M47816 Spondylosis without myelopathy or radiculopathy, lumbar region: Secondary | ICD-10-CM | POA: Diagnosis not present

## 2015-09-05 DIAGNOSIS — I10 Essential (primary) hypertension: Secondary | ICD-10-CM | POA: Insufficient documentation

## 2015-09-05 DIAGNOSIS — Z79899 Other long term (current) drug therapy: Secondary | ICD-10-CM | POA: Diagnosis not present

## 2015-09-05 DIAGNOSIS — G4730 Sleep apnea, unspecified: Secondary | ICD-10-CM | POA: Diagnosis not present

## 2015-09-05 DIAGNOSIS — E079 Disorder of thyroid, unspecified: Secondary | ICD-10-CM | POA: Diagnosis not present

## 2015-09-05 DIAGNOSIS — E1140 Type 2 diabetes mellitus with diabetic neuropathy, unspecified: Secondary | ICD-10-CM | POA: Insufficient documentation

## 2015-09-05 DIAGNOSIS — Z5181 Encounter for therapeutic drug level monitoring: Secondary | ICD-10-CM

## 2015-09-05 DIAGNOSIS — M4806 Spinal stenosis, lumbar region: Secondary | ICD-10-CM | POA: Diagnosis present

## 2015-09-05 DIAGNOSIS — K219 Gastro-esophageal reflux disease without esophagitis: Secondary | ICD-10-CM | POA: Diagnosis not present

## 2015-09-05 DIAGNOSIS — M25562 Pain in left knee: Secondary | ICD-10-CM | POA: Insufficient documentation

## 2015-09-05 DIAGNOSIS — N289 Disorder of kidney and ureter, unspecified: Secondary | ICD-10-CM | POA: Diagnosis not present

## 2015-09-05 MED ORDER — MORPHINE SULFATE 15 MG PO TABS: 15.0000 mg | ORAL_TABLET | Freq: Three times a day (TID) | ORAL | Status: DC

## 2015-09-05 NOTE — Progress Notes (Signed)
Bilateral Lumbar L3, L4  medial branch blocks and L 5 dorsal ramus injection under fluoroscopic guidance  Indication: Lumbar pain which is not relieved by medication management or other conservative care and interfering with self-care and mobility.  Informed consent was obtained after describing risks and benefits of the procedure with the patient, this includes bleeding, infection, paralysis and medication side effects.  The patient wishes to proceed and has given written consent.  The patient was placed in prone position.  The lumbar area was marked and prepped with Betadine.  One mL of 1% lidocaine was injected into each of 6 areas into the skin and subcutaneous tissue.  Then a 22-gauge 3.5in spinal needle was inserted targeting the junction of the left S1 superior articular process and sacral ala junction. Needle was advanced under fluoroscopic guidance.  Bone contact was made.  Omnipaque 180 was injected x 0.5 mL demonstrating no intravascular uptake.  Then a solution containing one mL of 4 mg per mL dexamethasone and 3 mL of 2% MPF lidocaine was injected x 0.5 mL.  Then the left L5 superior articular process in transverse process junction was targeted.  Bone contact was made.  Omnipaque 180 was injected x 0.5 mL demonstrating no intravascular uptake. Then a solution containing one mL of 4 mg per mL dexamethasone and 3 mL of 2% MPF lidocaine was injected x 0.5 mL.  Then the left L4 superior articular process in transverse process junction was targeted.  Bone contact was made.  Omnipaque 180 was injected x 0.5 mL demonstrating no intravascular uptake.  Then a solution containing one mL of 4 mg per mL dexamethasone and 3 mL if 2% MPF lidocaine was injected x 0.5 mL.  This same procedure was performed on the right side using the same needle, technique and injectate.  Patient tolerated procedure well.  Post procedure instructions were given.  On fluoroscopic views there was evidence of spondylolisthesis L5-S1  bilaterally as well as right greater than the left L3-L4

## 2015-09-05 NOTE — Progress Notes (Signed)
  [facility]   Name: Paige Arnold DOB:[birth year] MRN: [id number]  Date:09/05/2015  Physician: Alysia Penna, MD    Nurse/CMA: Aulton Routt RN  Allergies:  Allergies  Allergen Reactions  . Furosemide     Other reaction(s): Malaise (intolerance)  . Sulfa Antibiotics Itching    Consent Signed: Yes.    Is patient diabetic? Yes.    CBG today? 95  Pregnant: No. LMP: No LMP recorded. Patient is postmenopausal. (age 74-55)  Anticoagulants: no Anti-inflammatory: no Antibiotics: no  Procedure:Bilaterl L3-4-5 Medial Branch Blocks Position: Prone Start Time:10:43  End Time: 10:52  Fluoro Time: 28 sec  RN/CMA Biomedical engineer    Time 10:23 10:57    BP 128/40 152/58    Pulse 60 67    Respirations 16 16    O2 Sat 95 95    S/S 6 6    Pain Level 5/10 5/10     D/C home with husband, patient A & O X 3, D/C instructions reviewed, and sits independently.

## 2015-09-05 NOTE — Patient Instructions (Signed)

## 2015-09-11 LAB — 6-ACETYLMORPHINE,TOXASSURE ADD
6-ACETYLMORPHINE: NEGATIVE
6-acetylmorphine: NOT DETECTED ng/mg creat

## 2015-09-11 LAB — TOXASSURE SELECT,+ANTIDEPR,UR: PDF: 0

## 2015-09-13 NOTE — Progress Notes (Signed)
Urine drug screen for this encounter is consistent for prescribed medication. Took valium just prior to injection but had not metabolized yet.

## 2015-09-27 ENCOUNTER — Telehealth: Payer: Self-pay | Admitting: *Deleted

## 2015-09-27 NOTE — Telephone Encounter (Signed)
Prior Auth for lidocaine 5%patches submitted and approved through 07/15/16.

## 2015-10-03 ENCOUNTER — Encounter: Payer: Self-pay | Admitting: Registered Nurse

## 2015-10-03 ENCOUNTER — Encounter: Payer: Worker's Compensation | Attending: Registered Nurse | Admitting: Registered Nurse

## 2015-10-03 VITALS — BP 145/46 | HR 60

## 2015-10-03 DIAGNOSIS — G894 Chronic pain syndrome: Secondary | ICD-10-CM | POA: Diagnosis not present

## 2015-10-03 DIAGNOSIS — I10 Essential (primary) hypertension: Secondary | ICD-10-CM | POA: Insufficient documentation

## 2015-10-03 DIAGNOSIS — M545 Low back pain: Secondary | ICD-10-CM | POA: Diagnosis not present

## 2015-10-03 DIAGNOSIS — Z760 Encounter for issue of repeat prescription: Secondary | ICD-10-CM | POA: Insufficient documentation

## 2015-10-03 DIAGNOSIS — G8929 Other chronic pain: Secondary | ICD-10-CM | POA: Diagnosis not present

## 2015-10-03 DIAGNOSIS — Z79899 Other long term (current) drug therapy: Secondary | ICD-10-CM

## 2015-10-03 DIAGNOSIS — E079 Disorder of thyroid, unspecified: Secondary | ICD-10-CM | POA: Insufficient documentation

## 2015-10-03 DIAGNOSIS — E1140 Type 2 diabetes mellitus with diabetic neuropathy, unspecified: Secondary | ICD-10-CM | POA: Insufficient documentation

## 2015-10-03 DIAGNOSIS — Z5181 Encounter for therapeutic drug level monitoring: Secondary | ICD-10-CM | POA: Diagnosis not present

## 2015-10-03 DIAGNOSIS — M79641 Pain in right hand: Secondary | ICD-10-CM | POA: Diagnosis not present

## 2015-10-03 DIAGNOSIS — M25562 Pain in left knee: Secondary | ICD-10-CM | POA: Diagnosis not present

## 2015-10-03 DIAGNOSIS — M79672 Pain in left foot: Secondary | ICD-10-CM | POA: Diagnosis not present

## 2015-10-03 DIAGNOSIS — M1990 Unspecified osteoarthritis, unspecified site: Secondary | ICD-10-CM | POA: Insufficient documentation

## 2015-10-03 DIAGNOSIS — M47816 Spondylosis without myelopathy or radiculopathy, lumbar region: Secondary | ICD-10-CM

## 2015-10-03 DIAGNOSIS — M79642 Pain in left hand: Secondary | ICD-10-CM | POA: Diagnosis not present

## 2015-10-03 DIAGNOSIS — M4800 Spinal stenosis, site unspecified: Secondary | ICD-10-CM | POA: Insufficient documentation

## 2015-10-03 DIAGNOSIS — M79671 Pain in right foot: Secondary | ICD-10-CM | POA: Diagnosis not present

## 2015-10-03 DIAGNOSIS — N289 Disorder of kidney and ureter, unspecified: Secondary | ICD-10-CM | POA: Diagnosis not present

## 2015-10-03 MED ORDER — MORPHINE SULFATE 15 MG PO TABS: 15.0000 mg | ORAL_TABLET | Freq: Three times a day (TID) | ORAL | Status: DC

## 2015-10-03 NOTE — Progress Notes (Signed)
Subjective:    Patient ID: Paige Arnold, female    DOB: 1941-05-29, 75 y.o.   MRN: [id number]  HPI: [name] is a 75 year old female who returns for follow up for chronic pain and medication refill. She states her pain is located in her lower back and left knee. She rates her pain 4. Her current exercise regime is walking short distances with cane or walker. She states she prefers the walker for stability. S/P MBB with no relief she stated.  Pain Inventory Average Pain 6 Pain Right Now 4 My pain is sharp, burning, dull, stabbing, tingling and aching  In the last 24 hours, has pain interfered with the following? General activity 1 Relation with others 1 Enjoyment of life 0 What TIME of day is your pain at its worst? all Sleep (in general) NA  Pain is worse with: walking, bending and sitting Pain improves with: medication Relief from Meds: 2  Mobility walk with assistance use a cane  Function disabled: date disabled . retired I need assistance with the following:  meal prep, household duties and shopping  Neuro/Psych weakness tingling  Prior Studies Any changes since last visit?  no  Physicians involved in your care Any changes since last visit?  no   Family History  Problem Relation Age of Onset  . Kidney disease Mother   . Heart disease Father   . Diabetes Brother   . Alcohol abuse Brother   . Depression Brother   . Sarcoidosis Brother   . ALS Brother     Deceased, 40s  . Heart disease Brother   . Bipolar disorder Daughter    Social History   Social History  . Marital Status: Married    Spouse Name: N/A  . Number of Children: 3  . Years of Education: N/A   Occupational History  . retired    Social History Main Topics  . Smoking status: Never Smoker   . Smokeless tobacco: Never Used  . Alcohol Use: No  . Drug Use: No  . Sexual Activity: Yes   Other Topics Concern  . None   Social History Narrative   She lives with husband and  daughter.  Three grown children.   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     She took early retired at 72 because of problems of RSD.   Highest level of education:  Graduated high school      Past Surgical History  Procedure Laterality Date  . Tubal ligation    . Temporomandibular joint surgery    . Partial knee arthroplasty      left  . Cholecystectomy    . Intraocular lens insertion Right 10-21-13  . Partial hip arthroplasty    . Tonsillectomy    . Av fistula placement Left 02/16/2015    Procedure: LEFT ARM BRACHIOCEPHALIC (AV) FISTULA CREATION;  Surgeon: Mal Misty, MD;  Location: [place];  Service: Vascular;  Laterality: Left;  . Ligation of competing branches of arteriovenous fistula Left 02/16/2015    Procedure: LIGATION OF COMPETING BRANCHES OF LEFT BRACHIOCEPHALIC ARTERIOVENOUS FISTULA;  Surgeon: Mal Misty, MD;  Location: [place];  Service: Vascular;  Laterality: Left;   Past Medical History  Diagnosis Date  . Thyroid disorder   . Kidney disease   . Arthritis   . Hypertension   . Diabetes mellitus   . Shortness of breath dyspnea     when I first lay down, then  it gets better  . Sleep apnea     does not wear CPAP   . GERD (gastroesophageal reflux disease)   . Diabetic neuropathy ([place])   . Pneumonia Oct. 2016   BP 145/46 mmHg  Pulse 57  SpO2 95%  Opioid Risk Score:   Fall Risk Score:  `1  Depression screen PHQ 2/9  Depression screen [facility] 2/9 10/03/2015 09/05/2015 02/03/2015 10/01/2014  Decreased Interest 3 0 1 1  Down, Depressed, Hopeless 3 0 2 2  PHQ - 2 Score 6 0 3 3  Altered sleeping - - 2 2  Tired, decreased energy - - 2 2  Change in appetite - - 1 1  Feeling bad or failure about yourself  - - 0 0  Trouble concentrating - - 1 1  Moving slowly or fidgety/restless - - 1 1  Suicidal thoughts - - 0 0  PHQ-9 Score - - 10 10     Review of Systems  Endocrine:       High blood sugars  All other systems reviewed and are negative.       Objective:   Physical Exam  Constitutional: She is oriented to person, place, and time. She appears well-developed and well-nourished.  HENT:  Head: Normocephalic and atraumatic.  Neck: Normal range of motion. Neck supple.  Cardiovascular: Normal rate and regular rhythm.   Pulmonary/Chest: Effort normal and breath sounds normal.  Musculoskeletal:  Normal Muscle Bulk and Muscle Testing Reveals: Upper Extremities: Full ROM and Muscle Strength 5/5 Thoracic Paraspinal Tenderness: T-1- T-3 T-7- T-9 Lumbar Hypersensitivity: L-4- L-5 Lower Extremities; Full ROM and Muscle Strength 5/5 Right Lower Extremity Flexion Produces pain into Right Hip Left Lower Extremity Flexion Produces Pain into Left Patella Arises from chair slowly using straight cane for support Antalgic gait  Neurological: She is alert and oriented to person, place, and time.  Skin: Skin is warm and dry.  Psychiatric: She has a normal mood and affect.  Nursing note and vitals reviewed.         Assessment & Plan:  1. Spinal stenosis chronic low back pain radiating into the legs: Continue MSIR 15 mg take one tablet by mouth three times daily #90. 2. Diabetic Neuropathy: Continue Gabapentin  3. Left Knee Pain: Continue Current Medication and exercise Regimen. ( RSD) Continue Lidoderm Patches. 4. Left Hip Fracture S/P hemiarthroplasty: Orthopedist Following.  20 minutes of face to face patient care time was spent during this visit. All questions were encouraged and answered.   F/U in 1 month

## 2015-11-07 ENCOUNTER — Encounter: Payer: Self-pay | Admitting: Registered Nurse

## 2015-11-07 ENCOUNTER — Encounter: Payer: Worker's Compensation | Attending: Registered Nurse | Admitting: Registered Nurse

## 2015-11-07 VITALS — BP 136/57 | HR 68

## 2015-11-07 DIAGNOSIS — G8929 Other chronic pain: Secondary | ICD-10-CM | POA: Diagnosis not present

## 2015-11-07 DIAGNOSIS — M79642 Pain in left hand: Secondary | ICD-10-CM | POA: Insufficient documentation

## 2015-11-07 DIAGNOSIS — M545 Low back pain: Secondary | ICD-10-CM | POA: Diagnosis not present

## 2015-11-07 DIAGNOSIS — G894 Chronic pain syndrome: Secondary | ICD-10-CM | POA: Diagnosis not present

## 2015-11-07 DIAGNOSIS — Z5181 Encounter for therapeutic drug level monitoring: Secondary | ICD-10-CM | POA: Diagnosis not present

## 2015-11-07 DIAGNOSIS — M79672 Pain in left foot: Secondary | ICD-10-CM | POA: Insufficient documentation

## 2015-11-07 DIAGNOSIS — E1140 Type 2 diabetes mellitus with diabetic neuropathy, unspecified: Secondary | ICD-10-CM | POA: Diagnosis not present

## 2015-11-07 DIAGNOSIS — I10 Essential (primary) hypertension: Secondary | ICD-10-CM | POA: Diagnosis not present

## 2015-11-07 DIAGNOSIS — E079 Disorder of thyroid, unspecified: Secondary | ICD-10-CM | POA: Diagnosis not present

## 2015-11-07 DIAGNOSIS — M1990 Unspecified osteoarthritis, unspecified site: Secondary | ICD-10-CM | POA: Insufficient documentation

## 2015-11-07 DIAGNOSIS — N289 Disorder of kidney and ureter, unspecified: Secondary | ICD-10-CM | POA: Diagnosis not present

## 2015-11-07 DIAGNOSIS — M4800 Spinal stenosis, site unspecified: Secondary | ICD-10-CM | POA: Diagnosis not present

## 2015-11-07 DIAGNOSIS — Z760 Encounter for issue of repeat prescription: Secondary | ICD-10-CM | POA: Diagnosis not present

## 2015-11-07 DIAGNOSIS — M47816 Spondylosis without myelopathy or radiculopathy, lumbar region: Secondary | ICD-10-CM | POA: Diagnosis not present

## 2015-11-07 DIAGNOSIS — M79641 Pain in right hand: Secondary | ICD-10-CM | POA: Diagnosis not present

## 2015-11-07 DIAGNOSIS — M79671 Pain in right foot: Secondary | ICD-10-CM | POA: Diagnosis not present

## 2015-11-07 DIAGNOSIS — M25562 Pain in left knee: Secondary | ICD-10-CM | POA: Diagnosis not present

## 2015-11-07 DIAGNOSIS — Z79899 Other long term (current) drug therapy: Secondary | ICD-10-CM

## 2015-11-07 MED ORDER — MORPHINE SULFATE 15 MG PO TABS: 15.0000 mg | ORAL_TABLET | Freq: Three times a day (TID) | ORAL | Status: DC

## 2015-11-08 ENCOUNTER — Encounter: Payer: Self-pay | Admitting: Registered Nurse

## 2015-11-08 NOTE — Progress Notes (Signed)
Subjective:    Patient ID: Paige Arnold, female    DOB: 07-09-1941, 75 y.o.   MRN: [id number]  HPI: Paige Arnold is a 75 year old female who returns for follow up for chronic pain and medication refill. She states her pain is located in her right wrist, lower back and left knee. She rates her pain 6. Her current exercise regime is walking short distances with cane or walker. She also states she was walking outside her home on 10/03/15 she lost her balance and fell. Her husband helped her up and she went to [facility] the next day. She states she fracture her right wrist she's wearing a cast and has a right cast shoe on and states she broke her right 4th and fifth digit toes, they were taped.  She brought her MSIR in a bottle dated 09/23/15, according to [facility] her last MSIR was picked up on 10/24/15. Her daughter sets up her medication, she was instructed to keep her medication in current bottle, she verbalizes understanding. She also will let her daughter know. Pain Inventory Average Pain 5 Pain Right Now 6 My pain is sharp, burning, dull, stabbing, tingling and aching  In the last 24 hours, has pain interfered with the following? General activity 1 Relation with others 1 Enjoyment of life 1 What TIME of day is your pain at its worst? all Sleep (in general) NA  Pain is worse with: walking, bending, inactivity, standing and some activites Pain improves with: rest, heat/ice and medication Relief from Meds: 4  Mobility walk with assistance use a cane ability to climb steps?  yes do you drive?  no needs help with transfers  Function retired I need assistance with the following:  meal prep, household duties and shopping  Neuro/Psych dizziness depression  Prior Studies Any changes since last visit?  no  Physicians involved in your care Any changes since last visit?  no   Family History  Problem Relation Age of Onset  . Kidney disease Mother   . Heart disease Father    . Diabetes Brother   . Alcohol abuse Brother   . Depression Brother   . Sarcoidosis Brother   . ALS Brother     Deceased, 18s  . Heart disease Brother   . Bipolar disorder Daughter    Social History   Social History  . Marital Status: Married    Spouse Name: N/A  . Number of Children: 3  . Years of Education: N/A   Occupational History  . retired    Social History Main Topics  . Smoking status: Never Smoker   . Smokeless tobacco: Never Used  . Alcohol Use: No  . Drug Use: No  . Sexual Activity: Yes   Other Topics Concern  . None   Social History Narrative   She lives with husband and daughter.  Three grown children.   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     She took early retired at 28 because of problems of RSD.   Highest level of education:  Graduated high school      Past Surgical History  Procedure Laterality Date  . Tubal ligation    . Temporomandibular joint surgery    . Partial knee arthroplasty      left  . Cholecystectomy    . Intraocular lens insertion Right 10-21-13  . Partial hip arthroplasty    . Tonsillectomy    . Av fistula placement Left 02/16/2015  Procedure: LEFT ARM BRACHIOCEPHALIC (AV) FISTULA CREATION;  Surgeon: Mal Misty, MD;  Location: [place];  Service: Vascular;  Laterality: Left;  . Ligation of competing branches of arteriovenous fistula Left 02/16/2015    Procedure: LIGATION OF COMPETING BRANCHES OF LEFT BRACHIOCEPHALIC ARTERIOVENOUS FISTULA;  Surgeon: Mal Misty, MD;  Location: [place];  Service: Vascular;  Laterality: Left;   Past Medical History  Diagnosis Date  . Thyroid disorder   . Kidney disease   . Arthritis   . Hypertension   . Diabetes mellitus   . Shortness of breath dyspnea     when I first lay down, then it gets better  . Sleep apnea     does not wear CPAP   . GERD (gastroesophageal reflux disease)   . Diabetic neuropathy ([place])   . Pneumonia Oct. 2016   BP 136/57 mmHg  Pulse 68   SpO2 95%  Opioid Risk Score:   Fall Risk Score:  `1  Depression screen PHQ 2/9  Depression screen [facility] 2/9 10/03/2015 09/05/2015 02/03/2015 10/01/2014  Decreased Interest 3 0 1 1  Down, Depressed, Hopeless 3 0 2 2  PHQ - 2 Score 6 0 3 3  Altered sleeping - - 2 2  Tired, decreased energy - - 2 2  Change in appetite - - 1 1  Feeling bad or failure about yourself  - - 0 0  Trouble concentrating - - 1 1  Moving slowly or fidgety/restless - - 1 1  Suicidal thoughts - - 0 0  PHQ-9 Score - - 10 10     Review of Systems  Constitutional: Positive for diaphoresis.  Gastrointestinal: Positive for constipation.  Endocrine:       High blood sugar  Genitourinary: Positive for difficulty urinating.  All other systems reviewed and are negative.      Objective:   Physical Exam  Constitutional: She is oriented to person, place, and time. She appears well-developed and well-nourished.  HENT:  Head: Normocephalic and atraumatic.  Neck: Normal range of motion. Neck supple.  Cardiovascular: Normal rate and regular rhythm.   Pulmonary/Chest: Effort normal and breath sounds normal.  Musculoskeletal:  Normal Muscle Bulk and Muscle Testing Reveals: Upper Extremities: Full ROM and Muscle Strength 5/5 Right forearm cast iuntact Lumbar Paraspinal Tenderness: L-3- L-5 Lower Extremities: Full ROM and Muscle Strength 5/5 Left Lower Extremity Flexion Produces Pain into Patella Arises from chair slowly using straight cane for support Wearing Right Cast shoe Narrow Based Gait  Neurological: She is alert and oriented to person, place, and time.  Skin: Skin is warm and dry.  Psychiatric: She has a normal mood and affect.  Nursing note and vitals reviewed.         Assessment & Plan:  1. Spinal stenosis chronic low back pain radiating into the legs: Continue MSIR 15 mg take one tablet by mouth three times daily #90. We will continue the opioid monitoring program, this consists of regular clinic  visits, examinations, urine drug screen, pill counts as well as use of [place] Controlled Substance Reporting System. 2. Diabetic Neuropathy: Continue Gabapentin  3. Left Knee Pain: Continue Current Medication and exercise Regimen. ( RSD) Continue Lidoderm Patches. 4. Left Hip Fracture S/P hemiarthroplasty: Orthopedist Following.  20 minutes of face to face patient care time was spent during this visit. All questions were encouraged and answered.   F/U in 1 month

## 2015-12-07 ENCOUNTER — Encounter: Payer: Self-pay | Admitting: Registered Nurse

## 2015-12-07 ENCOUNTER — Encounter: Payer: Worker's Compensation | Attending: Registered Nurse | Admitting: Registered Nurse

## 2015-12-07 VITALS — BP 126/64 | HR 56 | Resp 14

## 2015-12-07 DIAGNOSIS — E079 Disorder of thyroid, unspecified: Secondary | ICD-10-CM | POA: Insufficient documentation

## 2015-12-07 DIAGNOSIS — G894 Chronic pain syndrome: Secondary | ICD-10-CM | POA: Diagnosis not present

## 2015-12-07 DIAGNOSIS — I10 Essential (primary) hypertension: Secondary | ICD-10-CM | POA: Insufficient documentation

## 2015-12-07 DIAGNOSIS — M25562 Pain in left knee: Secondary | ICD-10-CM | POA: Insufficient documentation

## 2015-12-07 DIAGNOSIS — M79672 Pain in left foot: Secondary | ICD-10-CM | POA: Insufficient documentation

## 2015-12-07 DIAGNOSIS — M47816 Spondylosis without myelopathy or radiculopathy, lumbar region: Secondary | ICD-10-CM | POA: Diagnosis not present

## 2015-12-07 DIAGNOSIS — M79642 Pain in left hand: Secondary | ICD-10-CM | POA: Diagnosis not present

## 2015-12-07 DIAGNOSIS — M545 Low back pain: Secondary | ICD-10-CM | POA: Diagnosis not present

## 2015-12-07 DIAGNOSIS — M4800 Spinal stenosis, site unspecified: Secondary | ICD-10-CM | POA: Diagnosis not present

## 2015-12-07 DIAGNOSIS — Z5181 Encounter for therapeutic drug level monitoring: Secondary | ICD-10-CM | POA: Diagnosis not present

## 2015-12-07 DIAGNOSIS — G8929 Other chronic pain: Secondary | ICD-10-CM

## 2015-12-07 DIAGNOSIS — N289 Disorder of kidney and ureter, unspecified: Secondary | ICD-10-CM | POA: Insufficient documentation

## 2015-12-07 DIAGNOSIS — Z760 Encounter for issue of repeat prescription: Secondary | ICD-10-CM | POA: Insufficient documentation

## 2015-12-07 DIAGNOSIS — E1140 Type 2 diabetes mellitus with diabetic neuropathy, unspecified: Secondary | ICD-10-CM | POA: Diagnosis not present

## 2015-12-07 DIAGNOSIS — M1990 Unspecified osteoarthritis, unspecified site: Secondary | ICD-10-CM | POA: Diagnosis not present

## 2015-12-07 DIAGNOSIS — M79641 Pain in right hand: Secondary | ICD-10-CM | POA: Diagnosis not present

## 2015-12-07 DIAGNOSIS — M79671 Pain in right foot: Secondary | ICD-10-CM | POA: Insufficient documentation

## 2015-12-07 DIAGNOSIS — Z79899 Other long term (current) drug therapy: Secondary | ICD-10-CM

## 2015-12-07 MED ORDER — MORPHINE SULFATE 15 MG PO TABS: 15.0000 mg | ORAL_TABLET | Freq: Three times a day (TID) | ORAL | Status: DC

## 2015-12-07 NOTE — Progress Notes (Signed)
Subjective:    Patient ID: Paige Arnold, female    DOB: [birth year], 75 y.o.   MRN: [id number]  HPI: [name] is a 75 year old female who returns for follow up for chronic pain and medication refill. She states her pain is located in her lower back and left knee. She rates her pain 3. Her current exercise regime is walking short distances with cane or walker.   Pain Inventory Average Pain 4 Pain Right Now 3 My pain is NA  In the last 24 hours, has pain interfered with the following? General activity 2 Relation with others 1 Enjoyment of life 1 What TIME of day is your pain at its worst? All Sleep (in general) NA  Pain is worse with: walking, bending, standing and some activites Pain improves with: rest and heat/ice Relief from Meds: 3  Mobility use a cane  Function disabled: date disabled NA retired I need assistance with the following:  meal prep, household duties and shopping  Neuro/Psych weakness trouble walking  Prior Studies Any changes since last visit?  no  Physicians involved in your care Any changes since last visit?  no   Family History  Problem Relation Age of Onset  . Kidney disease Mother   . Heart disease Father   . Diabetes Brother   . Alcohol abuse Brother   . Depression Brother   . Sarcoidosis Brother   . ALS Brother     Deceased, 54s  . Heart disease Brother   . Bipolar disorder Paige Arnold    Social History   Social History  . Marital Status: Married    Spouse Name: N/A  . Number of Children: 3  . Years of Education: N/A   Occupational History  . retired    Social History Main Topics  . Smoking status: Never Smoker   . Smokeless tobacco: Never Used  . Alcohol Use: No  . Drug Use: No  . Sexual Activity: Yes   Other Topics Concern  . None   Social History Narrative   She lives with husband and Paige Arnold.  Three grown children.   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     She took early retired  at 24 because of problems of RSD.   Highest level of education:  Graduated high school      Past Surgical History  Procedure Laterality Date  . Tubal ligation    . Temporomandibular joint surgery    . Partial knee arthroplasty      left  . Cholecystectomy    . Intraocular lens insertion Right 10-21-13  . Partial hip arthroplasty    . Tonsillectomy    . Av fistula placement Left 02/16/2015    Procedure: LEFT ARM BRACHIOCEPHALIC (AV) FISTULA CREATION;  Surgeon: Mal Misty, MD;  Location: [place];  Service: Vascular;  Laterality: Left;  . Ligation of competing branches of arteriovenous fistula Left 02/16/2015    Procedure: LIGATION OF COMPETING BRANCHES OF LEFT BRACHIOCEPHALIC ARTERIOVENOUS FISTULA;  Surgeon: Mal Misty, MD;  Location: [place];  Service: Vascular;  Laterality: Left;   Past Medical History  Diagnosis Date  . Thyroid disorder   . Kidney disease   . Arthritis   . Hypertension   . Diabetes mellitus   . Shortness of breath dyspnea     when I first lay down, then it gets better  . Sleep apnea     does not wear CPAP   .  GERD (gastroesophageal reflux disease)   . Diabetic neuropathy ([place])   . Pneumonia Oct. 2016   BP 120/56 mmHg  Pulse 58  Resp 14  SpO2 96%  Opioid Risk Score:   Fall Risk Score:  `1  Depression screen PHQ 2/9  Depression screen [facility] 2/9 12/07/2015 10/03/2015 09/05/2015 02/03/2015 10/01/2014  Decreased Interest 0 3 0 1 1  Down, Depressed, Hopeless 0 3 0 2 2  PHQ - 2 Score 0 6 0 3 3  Altered sleeping - - - 2 2  Tired, decreased energy - - - 2 2  Change in appetite - - - 1 1  Feeling bad or failure about yourself  - - - 0 0  Trouble concentrating - - - 1 1  Moving slowly or fidgety/restless - - - 1 1  Suicidal thoughts - - - 0 0  PHQ-9 Score - - - 10 10       Review of Systems  Constitutional: Positive for unexpected weight change.  Gastrointestinal: Positive for constipation.  Genitourinary: Positive for dysuria.  All other systems  reviewed and are negative.      Objective:   Physical Exam  Constitutional: She is oriented to person, place, and time. She appears well-developed and well-nourished.  HENT:  Head: Normocephalic and atraumatic.  Neck: Normal range of motion. Neck supple.  Cardiovascular: Normal rate and regular rhythm.   Pulmonary/Chest: Effort normal and breath sounds normal.  Musculoskeletal:  Normal Muscle Bulk and Muscle Testing Reveals: Upper Extremities: Full ROM and Muscle Strength 5/5 Thoracic Paraspinal Tenderness: T-7- T-9 Lumbar Paraspinal Tenderness: L-3- L-5 Lower Extremities: Full ROM and Muscle Strength 5/5 Arises from chair slowly using straight cane for support Narrow Based Gait  Neurological: She is alert and oriented to person, place, and time.  Skin: Skin is warm and dry.  Psychiatric: She has a normal mood and affect.  Nursing note and vitals reviewed.         Assessment & Plan:  1. Spinal stenosis chronic low back pain radiating into the legs: Continue MSIR 15 mg take one tablet by mouth three times daily #90. We will continue the opioid monitoring program, this consists of regular clinic visits, examinations, urine drug screen, pill counts as well as use of [place] Controlled Substance Reporting System. 2. Diabetic Neuropathy: Continue Gabapentin  3. Left Knee Pain: Continue Current Medication and exercise Regimen. ( RSD) Continue Lidoderm Patches. 4. Left Hip Fracture S/P hemiarthroplasty: Orthopedist Following.  20 minutes of face to face patient care time was spent during this visit. All questions were encouraged and answered.   F/U in 1 month

## 2016-01-10 ENCOUNTER — Encounter: Payer: Worker's Compensation | Attending: Registered Nurse | Admitting: Registered Nurse

## 2016-01-10 ENCOUNTER — Encounter: Payer: Self-pay | Admitting: Registered Nurse

## 2016-01-10 VITALS — BP 111/61 | HR 62 | Resp 17

## 2016-01-10 DIAGNOSIS — M545 Low back pain: Secondary | ICD-10-CM | POA: Insufficient documentation

## 2016-01-10 DIAGNOSIS — M1990 Unspecified osteoarthritis, unspecified site: Secondary | ICD-10-CM | POA: Insufficient documentation

## 2016-01-10 DIAGNOSIS — Z760 Encounter for issue of repeat prescription: Secondary | ICD-10-CM | POA: Insufficient documentation

## 2016-01-10 DIAGNOSIS — M79642 Pain in left hand: Secondary | ICD-10-CM | POA: Insufficient documentation

## 2016-01-10 DIAGNOSIS — Z5181 Encounter for therapeutic drug level monitoring: Secondary | ICD-10-CM

## 2016-01-10 DIAGNOSIS — I10 Essential (primary) hypertension: Secondary | ICD-10-CM | POA: Diagnosis not present

## 2016-01-10 DIAGNOSIS — M4800 Spinal stenosis, site unspecified: Secondary | ICD-10-CM | POA: Diagnosis not present

## 2016-01-10 DIAGNOSIS — G894 Chronic pain syndrome: Secondary | ICD-10-CM

## 2016-01-10 DIAGNOSIS — N289 Disorder of kidney and ureter, unspecified: Secondary | ICD-10-CM | POA: Insufficient documentation

## 2016-01-10 DIAGNOSIS — E079 Disorder of thyroid, unspecified: Secondary | ICD-10-CM | POA: Diagnosis not present

## 2016-01-10 DIAGNOSIS — M79641 Pain in right hand: Secondary | ICD-10-CM | POA: Insufficient documentation

## 2016-01-10 DIAGNOSIS — E1140 Type 2 diabetes mellitus with diabetic neuropathy, unspecified: Secondary | ICD-10-CM | POA: Insufficient documentation

## 2016-01-10 DIAGNOSIS — Z79899 Other long term (current) drug therapy: Secondary | ICD-10-CM

## 2016-01-10 DIAGNOSIS — M25562 Pain in left knee: Secondary | ICD-10-CM | POA: Diagnosis not present

## 2016-01-10 DIAGNOSIS — M79671 Pain in right foot: Secondary | ICD-10-CM | POA: Diagnosis not present

## 2016-01-10 DIAGNOSIS — E1149 Type 2 diabetes mellitus with other diabetic neurological complication: Secondary | ICD-10-CM

## 2016-01-10 DIAGNOSIS — M79672 Pain in left foot: Secondary | ICD-10-CM | POA: Diagnosis not present

## 2016-01-10 DIAGNOSIS — G8929 Other chronic pain: Secondary | ICD-10-CM | POA: Diagnosis not present

## 2016-01-10 DIAGNOSIS — M47817 Spondylosis without myelopathy or radiculopathy, lumbosacral region: Secondary | ICD-10-CM

## 2016-01-10 MED ORDER — MORPHINE SULFATE 15 MG PO TABS: 15.0000 mg | ORAL_TABLET | Freq: Three times a day (TID) | ORAL | Status: DC

## 2016-01-10 NOTE — Progress Notes (Signed)
Subjective:    Patient ID: Paige Arnold, female    DOB: 1940-11-19, 75 y.o.   MRN: [id number]  HPI: Paige Arnold is a 75 year old female who returns for follow up for chronic pain and medication refill. She states her pain is located in her lower back, left knee and lower extremities. She rates her pain 5. Her current exercise regime is walking short distances with cane or walker.  Pain Inventory Average Pain 5 Pain Right Now 5 My pain is sharp, burning, dull, stabbing, tingling and aching  In the last 24 hours, has pain interfered with the following? General activity 1 Relation with others 0 Enjoyment of life 1 What TIME of day is your pain at its worst? morning, daytime, evening, night Sleep (in general) NA  Pain is worse with: walking, bending and standing Pain improves with: NA Relief from Meds: 5  Mobility use a cane do you drive?  no  Function retired I need assistance with the following:  meal prep, household duties and shopping  Neuro/Psych No problems in this area  Prior Studies Any changes since last visit?  no  Physicians involved in your care Any changes since last visit?  no   Family History  Problem Relation Age of Onset  . Kidney disease Mother   . Heart disease Father   . Diabetes Brother   . Alcohol abuse Brother   . Depression Brother   . Sarcoidosis Brother   . ALS Brother     Deceased, 33s  . Heart disease Brother   . Bipolar disorder Daughter    Social History   Social History  . Marital Status: Married    Spouse Name: N/A  . Number of Children: 3  . Years of Education: N/A   Occupational History  . retired    Social History Main Topics  . Smoking status: Never Smoker   . Smokeless tobacco: Never Used  . Alcohol Use: No  . Drug Use: No  . Sexual Activity: Yes   Other Topics Concern  . None   Social History Narrative   She lives with husband and daughter.  Three grown children.   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     She took early retired at 66 because of problems of RSD.   Highest level of education:  Graduated high school      Past Surgical History  Procedure Laterality Date  . Tubal ligation    . Temporomandibular joint surgery    . Partial knee arthroplasty      left  . Cholecystectomy    . Intraocular lens insertion Right 10-21-13  . Partial hip arthroplasty    . Tonsillectomy    . Av fistula placement Left 02/16/2015    Procedure: LEFT ARM BRACHIOCEPHALIC (AV) FISTULA CREATION;  Surgeon: Mal Misty, MD;  Location: [facility];  Service: Vascular;  Laterality: Left;  . Ligation of competing branches of arteriovenous fistula Left 02/16/2015    Procedure: LIGATION OF COMPETING BRANCHES OF LEFT BRACHIOCEPHALIC ARTERIOVENOUS FISTULA;  Surgeon: Mal Misty, MD;  Location: [place];  Service: Vascular;  Laterality: Left;   Past Medical History  Diagnosis Date  . Thyroid disorder   . Kidney disease   . Arthritis   . Hypertension   . Diabetes mellitus   . Shortness of breath dyspnea     when I first lay down, then it gets better  . Sleep apnea  does not wear CPAP   . GERD (gastroesophageal reflux disease)   . Diabetic neuropathy ([place])   . Pneumonia Oct. 2016   BP 111/61 mmHg  Pulse 62  Resp 17  SpO2 94%  Opioid Risk Score:   Fall Risk Score:  `1  Depression screen PHQ 2/9  Depression screen [facility] 2/9 12/07/2015 10/03/2015 09/05/2015 02/03/2015 10/01/2014  Decreased Interest 0 3 0 1 1  Down, Depressed, Hopeless 0 3 0 2 2  PHQ - 2 Score 0 6 0 3 3  Altered sleeping - - - 2 2  Tired, decreased energy - - - 2 2  Change in appetite - - - 1 1  Feeling bad or failure about yourself  - - - 0 0  Trouble concentrating - - - 1 1  Moving slowly or fidgety/restless - - - 1 1  Suicidal thoughts - - - 0 0  PHQ-9 Score - - - 10 10     Review of Systems  Constitutional: Positive for unexpected weight change.  Gastrointestinal: Positive for abdominal pain and  constipation.  Endocrine:       High blood sugar   Genitourinary: Positive for dysuria.  All other systems reviewed and are negative.      Objective:   Physical Exam  Constitutional: She is oriented to person, place, and time. She appears well-developed and well-nourished.  HENT:  Head: Normocephalic and atraumatic.  Neck: Normal range of motion. Neck supple.  Cardiovascular: Normal rate and regular rhythm.   Pulmonary/Chest: Effort normal and breath sounds normal.  Musculoskeletal:  Normal Muscle Bulk and Muscle Testing Reveals: Upper Extremities: Full ROM and Muscle Strength 5/5 Lumbar Paraspinal Tenderness: L-4- L-5 Lower Extremities: Full ROM and Muscle Strength 5/5 Left Lower Extremity Flexion Produces Pain into Patella Arises from chair slowly using straight cane for support Narrow Based Gait  Neurological: She is alert and oriented to person, place, and time.  Skin: Skin is warm and dry.  Psychiatric: She has a normal mood and affect.  Nursing note and vitals reviewed.         Assessment & Plan:  1. Spinal stenosis chronic low back pain radiating into the legs: Continue MSIR 15 mg take one tablet by mouth three times daily #90. We will continue the opioid monitoring program, this consists of regular clinic visits, examinations, urine drug screen, pill counts as well as use of [place] Controlled Substance Reporting System. 2. Diabetic Neuropathy: Continue Gabapentin  3. Left Knee Pain: Continue Current Medication and exercise Regimen. ( RSD) Continue Lidoderm Patches. 4. Left Hip Fracture S/P hemiarthroplasty: Orthopedist Following.  20 minutes of face to face patient care time was spent during this visit. All questions were encouraged and answered.   F/U in 1 month

## 2016-01-15 ENCOUNTER — Other Ambulatory Visit: Payer: Self-pay | Admitting: Neurology

## 2016-01-16 ENCOUNTER — Other Ambulatory Visit: Payer: Self-pay | Admitting: *Deleted

## 2016-01-16 MED ORDER — GABAPENTIN 300 MG PO CAPS: 300.0000 mg | ORAL_CAPSULE | Freq: Two times a day (BID) | ORAL | Status: DC

## 2016-01-16 NOTE — Telephone Encounter (Signed)
Rx sent.  No refills until after next appointment.

## 2016-01-20 LAB — TOXASSURE SELECT,+ANTIDEPR,UR: PDF: 0

## 2016-01-20 LAB — 6-ACETYLMORPHINE,TOXASSURE ADD
6-ACETYLMORPHINE: NEGATIVE
6-ACETYLMORPHINE: NOT DETECTED ng/mg{creat}

## 2016-01-28 ENCOUNTER — Other Ambulatory Visit: Payer: Self-pay | Admitting: Neurology

## 2016-01-30 ENCOUNTER — Other Ambulatory Visit: Payer: Self-pay | Admitting: *Deleted

## 2016-01-30 MED ORDER — DONEPEZIL HCL 10 MG PO TABS: 10.0000 mg | ORAL_TABLET | Freq: Every day | ORAL | Status: DC

## 2016-01-30 NOTE — Telephone Encounter (Signed)
Rx sent with one refill.  Patient needs an appointment before any more refills are given.

## 2016-02-02 NOTE — Progress Notes (Signed)
Urine drug screen for this encounter is consistent for prescribed medication 

## 2016-02-06 ENCOUNTER — Encounter: Payer: Self-pay | Admitting: Registered Nurse

## 2016-02-06 ENCOUNTER — Encounter: Payer: Worker's Compensation | Attending: Registered Nurse | Admitting: Registered Nurse

## 2016-02-06 VITALS — BP 127/71 | HR 59 | Temp 98.6°F

## 2016-02-06 DIAGNOSIS — M79672 Pain in left foot: Secondary | ICD-10-CM | POA: Insufficient documentation

## 2016-02-06 DIAGNOSIS — Z5181 Encounter for therapeutic drug level monitoring: Secondary | ICD-10-CM

## 2016-02-06 DIAGNOSIS — Z760 Encounter for issue of repeat prescription: Secondary | ICD-10-CM | POA: Insufficient documentation

## 2016-02-06 DIAGNOSIS — M4800 Spinal stenosis, site unspecified: Secondary | ICD-10-CM | POA: Diagnosis not present

## 2016-02-06 DIAGNOSIS — M79641 Pain in right hand: Secondary | ICD-10-CM | POA: Insufficient documentation

## 2016-02-06 DIAGNOSIS — M545 Low back pain: Secondary | ICD-10-CM | POA: Diagnosis not present

## 2016-02-06 DIAGNOSIS — N289 Disorder of kidney and ureter, unspecified: Secondary | ICD-10-CM | POA: Insufficient documentation

## 2016-02-06 DIAGNOSIS — M79642 Pain in left hand: Secondary | ICD-10-CM | POA: Insufficient documentation

## 2016-02-06 DIAGNOSIS — G894 Chronic pain syndrome: Secondary | ICD-10-CM | POA: Diagnosis not present

## 2016-02-06 DIAGNOSIS — M47817 Spondylosis without myelopathy or radiculopathy, lumbosacral region: Secondary | ICD-10-CM | POA: Diagnosis not present

## 2016-02-06 DIAGNOSIS — G8929 Other chronic pain: Secondary | ICD-10-CM | POA: Insufficient documentation

## 2016-02-06 DIAGNOSIS — E079 Disorder of thyroid, unspecified: Secondary | ICD-10-CM | POA: Diagnosis not present

## 2016-02-06 DIAGNOSIS — M1990 Unspecified osteoarthritis, unspecified site: Secondary | ICD-10-CM | POA: Diagnosis not present

## 2016-02-06 DIAGNOSIS — E1140 Type 2 diabetes mellitus with diabetic neuropathy, unspecified: Secondary | ICD-10-CM | POA: Insufficient documentation

## 2016-02-06 DIAGNOSIS — I10 Essential (primary) hypertension: Secondary | ICD-10-CM | POA: Diagnosis not present

## 2016-02-06 DIAGNOSIS — Z79899 Other long term (current) drug therapy: Secondary | ICD-10-CM

## 2016-02-06 DIAGNOSIS — M79671 Pain in right foot: Secondary | ICD-10-CM | POA: Insufficient documentation

## 2016-02-06 DIAGNOSIS — M25562 Pain in left knee: Secondary | ICD-10-CM | POA: Insufficient documentation

## 2016-02-06 DIAGNOSIS — E1149 Type 2 diabetes mellitus with other diabetic neurological complication: Secondary | ICD-10-CM

## 2016-02-06 MED ORDER — MORPHINE SULFATE 15 MG PO TABS: 15.0000 mg | ORAL_TABLET | Freq: Three times a day (TID) | ORAL | 0 refills | Status: DC

## 2016-02-06 NOTE — Progress Notes (Signed)
Subjective:    Patient ID: Paige Arnold, female    DOB: 14-Dec-1940, 75 y.o.   MRN: [id number]  HPI: Paige Arnold is a 75 year old female who returns for follow up for chronic pain and medication refill. She states her pain is located in her upper-lower back and left knee.She rates her pain 4. Her current exercise regime is walking short distances with cane or walker.  Pain Inventory Average Pain 4 Pain Right Now 4 My pain is sharp, burning, dull and stabbing  In the last 24 hours, has pain interfered with the following? General activity 2 Relation with others 1 Enjoyment of life 1 What TIME of day is your pain at its worst? all Sleep (in general) Fair  Pain is worse with: walking, bending and standing Pain improves with: medication Relief from Meds: 4  Mobility use a cane  Function disabled: date disabled . retired  Neuro/Psych weakness numbness tingling trouble walking anxiety  Prior Studies Any changes since last visit?  no  Physicians involved in your care Any changes since last visit?  no   Family History  Problem Relation Age of Onset  . Kidney disease Mother   . Heart disease Father   . Diabetes Brother   . Alcohol abuse Brother   . Depression Brother   . Sarcoidosis Brother   . ALS Brother     Deceased, 39s  . Heart disease Brother   . Bipolar disorder Daughter    Social History   Social History  . Marital status: Married    Spouse name: N/A  . Number of children: 3  . Years of education: N/A   Occupational History  . retired    Social History Main Topics  . Smoking status: Never Smoker  . Smokeless tobacco: Never Used  . Alcohol use No  . Drug use: No  . Sexual activity: Yes   Other Topics Concern  . None   Social History Narrative   She lives with husband and daughter.  Three grown children.   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     She took early retired at 27 because of problems of RSD.   Highest level of education:  Graduated high school      Past Surgical History:  Procedure Laterality Date  . AV FISTULA PLACEMENT Left 02/16/2015   Procedure: LEFT ARM BRACHIOCEPHALIC (AV) FISTULA CREATION;  Surgeon: Mal Misty, MD;  Location: [place];  Service: Vascular;  Laterality: Left;  . CHOLECYSTECTOMY    . INTRAOCULAR LENS INSERTION Right 10-21-13  . LIGATION OF COMPETING BRANCHES OF ARTERIOVENOUS FISTULA Left 02/16/2015   Procedure: LIGATION OF COMPETING BRANCHES OF LEFT BRACHIOCEPHALIC ARTERIOVENOUS FISTULA;  Surgeon: Mal Misty, MD;  Location: [place];  Service: Vascular;  Laterality: Left;  . PARTIAL HIP ARTHROPLASTY    . PARTIAL KNEE ARTHROPLASTY     left  . TEMPOROMANDIBULAR JOINT SURGERY    . TONSILLECTOMY    . TUBAL LIGATION     Past Medical History:  Diagnosis Date  . Arthritis   . Diabetes mellitus   . Diabetic neuropathy ([place])   . GERD (gastroesophageal reflux disease)   . Hypertension   . Kidney disease   . Pneumonia Oct. 2016  . Shortness of breath dyspnea    when I first lay down, then it gets better  . Sleep apnea    does not wear CPAP   . Thyroid disorder    BP  127/71 (BP Location: Right Arm, Patient Position: Sitting, Cuff Size: Large)   Pulse (!) 59   Temp 98.6 F (37 C) (Oral)   SpO2 91%   Opioid Risk Score:   Fall Risk Score:  `1  Depression screen PHQ 2/9  Depression screen [facility] 2/9 02/06/2016 12/07/2015 10/03/2015 09/05/2015 02/03/2015 10/01/2014  Decreased Interest 0 0 3 0 1 1  Down, Depressed, Hopeless 0 0 3 0 2 2  PHQ - 2 Score 0 0 6 0 3 3  Altered sleeping - - - - 2 2  Tired, decreased energy - - - - 2 2  Change in appetite - - - - 1 1  Feeling bad or failure about yourself  - - - - 0 0  Trouble concentrating - - - - 1 1  Moving slowly or fidgety/restless - - - - 1 1  Suicidal thoughts - - - - 0 0  PHQ-9 Score - - - - 10 10    Review of Systems  Endocrine:       High blood sugars  All other systems reviewed and are negative.       Objective:   Physical Exam  Constitutional: She is oriented to person, place, and time. She appears well-developed and well-nourished.  HENT:  Head: Normocephalic and atraumatic.  Neck: Normal range of motion. Neck supple.  Cardiovascular: Normal rate and regular rhythm.   Pulmonary/Chest: Effort normal and breath sounds normal.  Musculoskeletal:  Normal Muscle Bulk and Muscle Testing Reveals: Upper Extremities: Full ROM and Muscle Strength 5/5 Thoracic Paraspinal Tenderness: T-1- T-3 Lumbar Paraspinal Tenderness: L-3- L-5 Lower Extremities: Full ROM and Muscle Strength 5/5 Left Lower Extremity Flexion Produces Pain into Left Patella and Left Lower Extremity Arises from Table Slowly using straight cane for support Narrow Based gait  Neurological: She is alert and oriented to person, place, and time.  Skin: Skin is warm and dry.  Psychiatric: She has a normal mood and affect.  Nursing note and vitals reviewed.         Assessment & Plan:  1. Spinal stenosis chronic low back pain radiating into the legs: Continue MSIR 15 mg take one tablet by mouth three times daily #90. We will continue the opioid monitoring program, this consists of regular clinic visits, examinations, urine drug screen, pill counts as well as use of [place] Controlled Substance Reporting System. 2. Diabetic Neuropathy: Continue Gabapentin  3. Left Knee Pain: Continue Current Medication and exercise Regimen. ( RSD) Continue Lidoderm Patches. 4. Left Hip Fracture S/P hemiarthroplasty: Orthopedist Following.  20 minutes of face to face patient care time was spent during this visit. All questions were encouraged and answered.   F/U in 1 month

## 2016-02-07 ENCOUNTER — Ambulatory Visit: Payer: Self-pay | Admitting: Registered Nurse

## 2016-02-15 ENCOUNTER — Other Ambulatory Visit: Payer: Self-pay | Admitting: Neurology

## 2016-02-15 NOTE — Telephone Encounter (Signed)
Rx sent.  Appointment on 02-23-16

## 2016-02-23 ENCOUNTER — Encounter: Payer: Self-pay | Admitting: Neurology

## 2016-02-23 ENCOUNTER — Ambulatory Visit (INDEPENDENT_AMBULATORY_CARE_PROVIDER_SITE_OTHER): Payer: Medicare Other | Admitting: Neurology

## 2016-02-23 VITALS — BP 100/60 | HR 66 | Ht 65.0 in | Wt 209.4 lb

## 2016-02-23 DIAGNOSIS — G3184 Mild cognitive impairment, so stated: Secondary | ICD-10-CM

## 2016-02-23 DIAGNOSIS — E0842 Diabetes mellitus due to underlying condition with diabetic polyneuropathy: Secondary | ICD-10-CM | POA: Diagnosis not present

## 2016-02-23 MED ORDER — GABAPENTIN 300 MG PO CAPS: 600.0000 mg | ORAL_CAPSULE | Freq: Two times a day (BID) | ORAL | 3 refills | Status: DC

## 2016-02-23 NOTE — Progress Notes (Signed)
Follow-up Visit   Date: 02/23/16    Paige Arnold MRN: [id number] DOB: [birth year]   Interim History: Paige Arnold is a 75 y.o. right-handed Caucasian female with hypertension, diabetes mellitus, depression/anxiety, hypothyroidism, and chronic pain returning for evaluation of memory loss, neuropathy, and tremors. The patient was accompanied to the clinic by self.  History of present illness: She has neuropathy for the past two years, described throbbing.  She does not burning or tingling, but says that she cannot feel her feet.  The numbness extends to where her ankles are.  She endorses problems with balance and has been using a cane since her knee surgery in 2001.  She takes gabapentin 400mg  in the morning and 800mg  at bedtime.  She reports having previous NCS/EMG of her hands and fee (results unknown)t.  She also complains of memory problems for a number of years.  She does not drive or pay her own bills.  She does not cook, clean, or have any hobbies.  Her day is spent watching TV and sleeping. She does not exercise.  She takes aricept 10mg  daily. She feels that her memory is slowly worsening.  She does not keep up with the date or appointments.  She forgets peoples names.  She does not repeat herself.  She cannot multitask. She says that neuropsychological testing as well as imaging has been performed, but I do not have these records.  Starting in 2015, she developed tremors which as noticeable when she holds objects.  She feels like her whole body has an internal tremor, but does not actually see it.    She endorses feeling of anxiety and depressed mood.  She has seen psychiatry and counsellors in the past, but they didn't "click" with her so she stopped seeing them.  She says "I want someone to listen to me" and began crying.  No SI/HI.  Of note, she has RSD and lumbar stenosis and is on morphine 15mg  qh6 prn pain, followed by Dr. Letta Pate.  UPDATE 02/23/2016:  She scheduled a  sooner appointment because of worsening pain of the feet.  She does not have tingling or burning pain, states it is more throbbing and achy.  She has various areas of the ankles that hurt when she applies pressure.  She denies any new memory problems and feels this is stable.  She did not schedule neuropsychological testing, which was recommended at her last visit.  She does not notice tremors as much today.   Medications:  Current Outpatient Prescriptions on File Prior to Visit  Medication Sig Dispense Refill  . amLODipine (NORVASC) 5 MG tablet Take 5 mg by mouth daily. 1/2 tablet daily    . aspirin 81 MG tablet Take 81 mg by mouth daily.    . cloNIDine (CATAPRES) 0.1 MG tablet Take 0.1-0.2 mg by mouth 2 (two) times daily. 2 tablets in the morning, 1 tablet in the evening    . dicyclomine (BENTYL) 20 MG tablet Take 20 mg by mouth every 6 (six) hours as needed for spasms.    Marland Kitchen donepezil (ARICEPT) 10 MG tablet Take 1 tablet (10 mg total) by mouth at bedtime. 1 q hs 30 tablet 1  . DULoxetine (CYMBALTA) 30 MG capsule Take 30 mg by mouth daily.    Marland Kitchen glimepiride (AMARYL) 2 MG tablet Take 2 mg by mouth daily with breakfast.    . levothyroxine (SYNTHROID, LEVOTHROID) 125 MCG tablet Take 125 mcg by mouth daily before breakfast.    .  lidocaine (LIDODERM) 5 % Place 1 patch onto the skin every 12 (twelve) hours. Remove & Discard patch within 12 hours or as directed 30 patch 4  . morphine (MSIR) 15 MG tablet Take 1 tablet (15 mg total) by mouth 3 (three) times daily. 90 tablet 0  . multivitamin-iron-minerals-folic acid (CENTRUM) chewable tablet Chew 1 tablet by mouth daily.    . Omega-3 Fatty Acids (FISH OIL) 1000 MG CAPS Take by mouth.    . phenazopyridine (PYRIDIUM) 100 MG tablet Take 100 mg by mouth 3 (three) times daily as needed for pain.    . polyethylene glycol (MIRALAX / GLYCOLAX) packet Take by mouth.    . pravastatin (PRAVACHOL) 40 MG tablet Take 40 mg by mouth daily.     No current  facility-administered medications on file prior to visit.     Allergies:  Allergies  Allergen Reactions  . Furosemide     Other reaction(s): Malaise (intolerance)  . Sulfa Antibiotics Itching    Review of Systems:  CONSTITUTIONAL: No fevers, chills, night sweats, or weight loss.  EYES: No visual changes or eye pain ENT: No hearing changes.  No history of nose bleeds.   RESPIRATORY: No cough, wheezing and shortness of breath.   CARDIOVASCULAR: Negative for chest pain, and palpitations.   GI: Negative for abdominal discomfort, blood in stools or black stools.  No recent change in bowel habits.   GU:  No history of incontinence.   MUSCLOSKELETAL: ++history of joint pain or swelling.  +myalgias.   SKIN: Negative for lesions, rash, and itching.   ENDOCRINE: Negative for cold or heat intolerance, polydipsia or goiter.   PSYCH:  + depression or anxiety symptoms.   NEURO: As Above.   Vital Signs:  BP 100/60   Pulse 66   Ht 5\' 5"  (1.651 m)   Wt 209 lb 6 oz (95 kg)   SpO2 90%   BMI 34.84 kg/m   Neurological Exam: MENTAL STATUS including orientation to time, place, person, recent and remote memory, attention span and concentration, language, and fund of knowledge is normal.  Speech is not dysarthric.  CRANIAL NERVES:  Face is symmetric.   MOTOR:  Motor strength is 5/5 in all extremities. No tremors.  MSRs:  Reflexes are 2+/4 throughout, except absent Achilles.  SENSORY:  Diminished at ankles bilaterally.  COORDINATION/GAIT:  Gait is slow, wide-based due to body habitus, and assisted with cane.  Data: Lab Results  Component Value Date   TSH 0.75 11/18/2014   Lab Results  Component Value Date   HGBA1C 6.3 (H) 03/09/2014     IMPRESSION/PLAN: 1.  Diabetic peripheral neuropathy - worsening At her last visit, she reported numbness as the primary symptom so it was decided to taper her gabapentin to minimize medication with cognitive side effects, but today she complains of  worsening feet pain.  She does not characterize the pain as burning or tingling, but states that it "hurts" and is sensitivie to touch.  Although these are not typical features of neuropathy, we can certainly increase her gabapentin to 600mg  twice daily and see if this helps.  She also takes morphine 15mg  TID for low back pain and knee pain which is written by pain management.     2.  Benign essential tremors, stable.  Symptoms too mild and not apparent on exam  3.  Cognitive impairment, multifactorial (depression, medications, dementia syndrome).  She declined repeat neuropsychological testing.  Continue Aricept 10mg  daily.    Return to clinic in  6 months  The duration of this appointment visit was 25 minutes of face-to-face time with the patient.  Greater than 50% of this time was spent in counseling, explanation of diagnosis, planning of further management, and coordination of care.   Thank you for allowing me to participate in patient's care.  If I can answer any additional questions, I would be pleased to do so.    Sincerely,    Elchonon Maxson K. Posey Pronto, DO

## 2016-02-23 NOTE — Patient Instructions (Addendum)
Increase gabapenin to 600mg  twice daily  Return to clinic in 6 months

## 2016-03-08 ENCOUNTER — Ambulatory Visit: Payer: Self-pay | Admitting: Registered Nurse

## 2016-03-15 ENCOUNTER — Encounter: Payer: Self-pay | Admitting: Registered Nurse

## 2016-03-15 ENCOUNTER — Encounter: Payer: Worker's Compensation | Attending: Registered Nurse | Admitting: Registered Nurse

## 2016-03-15 VITALS — BP 101/63 | HR 56 | Resp 16

## 2016-03-15 DIAGNOSIS — M79641 Pain in right hand: Secondary | ICD-10-CM | POA: Insufficient documentation

## 2016-03-15 DIAGNOSIS — M545 Low back pain, unspecified: Secondary | ICD-10-CM

## 2016-03-15 DIAGNOSIS — M79672 Pain in left foot: Secondary | ICD-10-CM | POA: Diagnosis not present

## 2016-03-15 DIAGNOSIS — I10 Essential (primary) hypertension: Secondary | ICD-10-CM | POA: Diagnosis not present

## 2016-03-15 DIAGNOSIS — M79642 Pain in left hand: Secondary | ICD-10-CM | POA: Diagnosis not present

## 2016-03-15 DIAGNOSIS — N289 Disorder of kidney and ureter, unspecified: Secondary | ICD-10-CM | POA: Diagnosis not present

## 2016-03-15 DIAGNOSIS — E1149 Type 2 diabetes mellitus with other diabetic neurological complication: Secondary | ICD-10-CM

## 2016-03-15 DIAGNOSIS — M1990 Unspecified osteoarthritis, unspecified site: Secondary | ICD-10-CM | POA: Insufficient documentation

## 2016-03-15 DIAGNOSIS — M4800 Spinal stenosis, site unspecified: Secondary | ICD-10-CM | POA: Diagnosis not present

## 2016-03-15 DIAGNOSIS — Z79899 Other long term (current) drug therapy: Secondary | ICD-10-CM

## 2016-03-15 DIAGNOSIS — G8929 Other chronic pain: Secondary | ICD-10-CM | POA: Diagnosis not present

## 2016-03-15 DIAGNOSIS — Z760 Encounter for issue of repeat prescription: Secondary | ICD-10-CM | POA: Insufficient documentation

## 2016-03-15 DIAGNOSIS — M47817 Spondylosis without myelopathy or radiculopathy, lumbosacral region: Secondary | ICD-10-CM

## 2016-03-15 DIAGNOSIS — M25562 Pain in left knee: Secondary | ICD-10-CM | POA: Diagnosis not present

## 2016-03-15 DIAGNOSIS — G894 Chronic pain syndrome: Secondary | ICD-10-CM

## 2016-03-15 DIAGNOSIS — E1140 Type 2 diabetes mellitus with diabetic neuropathy, unspecified: Secondary | ICD-10-CM | POA: Diagnosis not present

## 2016-03-15 DIAGNOSIS — M542 Cervicalgia: Secondary | ICD-10-CM

## 2016-03-15 DIAGNOSIS — M79671 Pain in right foot: Secondary | ICD-10-CM | POA: Diagnosis not present

## 2016-03-15 DIAGNOSIS — E079 Disorder of thyroid, unspecified: Secondary | ICD-10-CM | POA: Insufficient documentation

## 2016-03-15 DIAGNOSIS — Z5181 Encounter for therapeutic drug level monitoring: Secondary | ICD-10-CM

## 2016-03-15 MED ORDER — MORPHINE SULFATE 15 MG PO TABS: 15.0000 mg | ORAL_TABLET | Freq: Three times a day (TID) | ORAL | 0 refills | Status: DC

## 2016-03-15 NOTE — Progress Notes (Signed)
Subjective:    Patient ID: Paige Arnold, female    DOB: 06/30/1941, 75 y.o.   MRN: [id number]  HPI:  Paige Arnold is a 75 year old female who returns for follow up for chronic pain and medication refill. She states her pain is located in her neck, lower back and left knee.Also states her pain has intensified in her neck and lower back since Saturday February 13, 202017, will order X-rays today, she verbalizes understanding.She rates her pain 5. Her current exercise regime is walking short distances with cane or walker.  Pain Inventory Average Pain 6 Pain Right Now 5 My pain is sharp, burning, dull, stabbing, tingling and aching  In the last 24 hours, has pain interfered with the following? General activity 1 Relation with others 1 Enjoyment of life 4 What TIME of day is your pain at its worst? morning, daytime, evening, night Sleep (in general) NA  Pain is worse with: walking and standing Pain improves with: rest and heat/ice Relief from Meds: NA  Mobility use a cane  Function retired I need assistance with the following:  meal prep, household duties and shopping  Neuro/Psych No problems in this area  Prior Studies Any changes since last visit?  no  Physicians involved in your care Any changes since last visit?  no   Family History  Problem Relation Age of Onset  . Kidney disease Mother   . Heart disease Father   . Diabetes Brother   . Alcohol abuse Brother   . Depression Brother   . Sarcoidosis Brother   . ALS Brother     Deceased, 73s  . Heart disease Brother   . Bipolar disorder Daughter    Social History   Social History  . Marital status: Married    Spouse name: N/A  . Number of children: 3  . Years of education: N/A   Occupational History  . retired    Social History Main Topics  . Smoking status: Never Smoker  . Smokeless tobacco: Never Used  . Alcohol use No  . Drug use: No  . Sexual activity: Yes   Other Topics Concern  . None   Social  History Narrative   She lives with husband and daughter.  Three grown children.   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     She took early retired at 62 because of problems of RSD.   Highest level of education:  Graduated high school      Past Surgical History:  Procedure Laterality Date  . AV FISTULA PLACEMENT Left 02/16/2015   Procedure: LEFT ARM BRACHIOCEPHALIC (AV) FISTULA CREATION;  Surgeon: Mal Misty, MD;  Location: [facility];  Service: Vascular;  Laterality: Left;  . CHOLECYSTECTOMY    . INTRAOCULAR LENS INSERTION Right 10-21-13  . LIGATION OF COMPETING BRANCHES OF ARTERIOVENOUS FISTULA Left 02/16/2015   Procedure: LIGATION OF COMPETING BRANCHES OF LEFT BRACHIOCEPHALIC ARTERIOVENOUS FISTULA;  Surgeon: Mal Misty, MD;  Location: [facility];  Service: Vascular;  Laterality: Left;  . PARTIAL HIP ARTHROPLASTY    . PARTIAL KNEE ARTHROPLASTY     left  . TEMPOROMANDIBULAR JOINT SURGERY    . TONSILLECTOMY    . TUBAL LIGATION     Past Medical History:  Diagnosis Date  . Arthritis   . Diabetes mellitus   . Diabetic neuropathy ([place])   . GERD (gastroesophageal reflux disease)   . Hypertension   . Kidney disease   . Pneumonia Oct. 2016  .  Shortness of breath dyspnea    when I first lay down, then it gets better  . Sleep apnea    does not wear CPAP   . Thyroid disorder    BP 101/63   Pulse (!) 56   Resp 16   SpO2 92%   Opioid Risk Score:   Fall Risk Score:  `1  Depression screen PHQ 2/9  Depression screen [facility] 2/9 02/06/2016 12/07/2015 10/03/2015 09/05/2015 02/03/2015 10/01/2014  Decreased Interest 0 0 3 0 1 1  Down, Depressed, Hopeless 0 0 3 0 2 2  PHQ - 2 Score 0 0 6 0 3 3  Altered sleeping - - - - 2 2  Tired, decreased energy - - - - 2 2  Change in appetite - - - - 1 1  Feeling bad or failure about yourself  - - - - 0 0  Trouble concentrating - - - - 1 1  Moving slowly or fidgety/restless - - - - 1 1  Suicidal thoughts - - - - 0 0  PHQ-9 Score - - - -  10 10    Review of Systems  Gastrointestinal: Positive for constipation.  Endocrine:       High blood sugar   All other systems reviewed and are negative.      Objective:   Physical Exam  Constitutional: She is oriented to person, place, and time. She appears well-developed and well-nourished.  HENT:  Head: Normocephalic and atraumatic.  Neck: Normal range of motion. Neck supple.  Cervical Paraspinal Tenderness: C-5- C-6  Cardiovascular: Normal rate and regular rhythm.   Pulmonary/Chest: Effort normal and breath sounds normal.  Musculoskeletal:  Normal Muscle Bulk and Muscle Testing Reveals: Upper Extremities: Full ROM and Muscle Strength 5/5 Lumbar Paraspinal Tenderness: L-3-L-5 Lower Extremities: Full ROM and Muscle Strength 5/5 Arises from Table slowly using straight cane for support Antalgic gait    Neurological: She is alert and oriented to person, place, and time.  Skin: Skin is warm and dry.  Psychiatric: She has a normal mood and affect.  Nursing note and vitals reviewed.         Assessment & Plan:  1. Acute Exacerbation of chronic low back pain" RX: Lumbar X-ray 2. Spinal stenosis chronic low back pain radiating into the legs: Continue MSIR 15 mg take one tablet by mouththree times daily #90. We will continue the opioid monitoring program, this consists of regular clinic visits, examinations, urine drug screen, pill counts as well as use of [place] Controlled Substance Reporting System. 3. Diabetic Neuropathy: Continue Gabapentin  4. Left Knee Pain: Continue Current Medication and exercise Regimen. ( RSD) Continue Lidoderm Patches. 5. Left Hip Fracture S/P hemiarthroplasty: Orthopedist Following. 6. Neck Pain: RX: Cervical X-ray  20 minutes of face to face patient care time was spent during this visit. All questions were encouraged and answered.   F/U in 1 month

## 2016-03-19 ENCOUNTER — Other Ambulatory Visit: Payer: Self-pay | Admitting: Neurology

## 2016-03-20 ENCOUNTER — Other Ambulatory Visit: Payer: Self-pay | Admitting: *Deleted

## 2016-03-20 MED ORDER — GABAPENTIN 300 MG PO CAPS: 600.0000 mg | ORAL_CAPSULE | Freq: Two times a day (BID) | ORAL | 3 refills | Status: DC

## 2016-03-20 NOTE — Telephone Encounter (Signed)
Rx sent 

## 2016-03-23 ENCOUNTER — Telehealth: Payer: Self-pay | Admitting: Registered Nurse

## 2016-03-23 NOTE — Telephone Encounter (Signed)
Return [name] call regarding PA for MSIR. [name]  Is paid by [facility] Compensation. Spohe to Pharmacists at [facility] Ilona Sorrel) instructed to fax form to [name] case Freight forwarder. The above was discussed with [name] and her daughter Paige Arnold. They verbalizes understanding.

## 2016-04-02 ENCOUNTER — Other Ambulatory Visit: Payer: Self-pay | Admitting: Neurology

## 2016-04-03 ENCOUNTER — Other Ambulatory Visit: Payer: Self-pay | Admitting: *Deleted

## 2016-04-03 MED ORDER — DONEPEZIL HCL 10 MG PO TABS: 10.0000 mg | ORAL_TABLET | Freq: Every day | ORAL | 1 refills | Status: DC

## 2016-04-03 NOTE — Telephone Encounter (Signed)
Rx sent 

## 2016-04-12 ENCOUNTER — Encounter: Payer: Self-pay | Admitting: Physical Medicine & Rehabilitation

## 2016-04-12 ENCOUNTER — Ambulatory Visit
Admission: RE | Admit: 2016-04-12 | Discharge: 2016-04-12 | Disposition: A | Discharge status: Home / Self Care | Payer: Medicare Other | Source: Ambulatory Visit | Attending: Registered Nurse | Admitting: Registered Nurse

## 2016-04-12 ENCOUNTER — Encounter: Payer: Worker's Compensation | Attending: Registered Nurse

## 2016-04-12 ENCOUNTER — Ambulatory Visit (HOSPITAL_BASED_OUTPATIENT_CLINIC_OR_DEPARTMENT_OTHER): Payer: Worker's Compensation | Admitting: Physical Medicine & Rehabilitation

## 2016-04-12 VITALS — BP 118/78 | HR 57 | Temp 97.8°F | Resp 14

## 2016-04-12 DIAGNOSIS — M4800 Spinal stenosis, site unspecified: Secondary | ICD-10-CM | POA: Insufficient documentation

## 2016-04-12 DIAGNOSIS — M4806 Spinal stenosis, lumbar region: Secondary | ICD-10-CM

## 2016-04-12 DIAGNOSIS — M1990 Unspecified osteoarthritis, unspecified site: Secondary | ICD-10-CM | POA: Diagnosis not present

## 2016-04-12 DIAGNOSIS — M79671 Pain in right foot: Secondary | ICD-10-CM | POA: Diagnosis not present

## 2016-04-12 DIAGNOSIS — G894 Chronic pain syndrome: Secondary | ICD-10-CM

## 2016-04-12 DIAGNOSIS — E1140 Type 2 diabetes mellitus with diabetic neuropathy, unspecified: Secondary | ICD-10-CM | POA: Insufficient documentation

## 2016-04-12 DIAGNOSIS — E079 Disorder of thyroid, unspecified: Secondary | ICD-10-CM | POA: Diagnosis not present

## 2016-04-12 DIAGNOSIS — M79642 Pain in left hand: Secondary | ICD-10-CM | POA: Diagnosis not present

## 2016-04-12 DIAGNOSIS — Z760 Encounter for issue of repeat prescription: Secondary | ICD-10-CM | POA: Insufficient documentation

## 2016-04-12 DIAGNOSIS — N289 Disorder of kidney and ureter, unspecified: Secondary | ICD-10-CM | POA: Insufficient documentation

## 2016-04-12 DIAGNOSIS — G8929 Other chronic pain: Secondary | ICD-10-CM | POA: Insufficient documentation

## 2016-04-12 DIAGNOSIS — M79641 Pain in right hand: Secondary | ICD-10-CM | POA: Diagnosis not present

## 2016-04-12 DIAGNOSIS — M79672 Pain in left foot: Secondary | ICD-10-CM | POA: Insufficient documentation

## 2016-04-12 DIAGNOSIS — G8928 Other chronic postprocedural pain: Secondary | ICD-10-CM

## 2016-04-12 DIAGNOSIS — M48061 Spinal stenosis, lumbar region without neurogenic claudication: Secondary | ICD-10-CM

## 2016-04-12 DIAGNOSIS — M542 Cervicalgia: Secondary | ICD-10-CM | POA: Diagnosis not present

## 2016-04-12 DIAGNOSIS — M25562 Pain in left knee: Secondary | ICD-10-CM | POA: Insufficient documentation

## 2016-04-12 DIAGNOSIS — M545 Low back pain: Secondary | ICD-10-CM | POA: Insufficient documentation

## 2016-04-12 DIAGNOSIS — Z79899 Other long term (current) drug therapy: Secondary | ICD-10-CM

## 2016-04-12 DIAGNOSIS — Z5181 Encounter for therapeutic drug level monitoring: Secondary | ICD-10-CM

## 2016-04-12 DIAGNOSIS — I10 Essential (primary) hypertension: Secondary | ICD-10-CM | POA: Diagnosis not present

## 2016-04-12 MED ORDER — MORPHINE SULFATE 15 MG PO TABS: 15.0000 mg | ORAL_TABLET | Freq: Three times a day (TID) | ORAL | 0 refills | Status: DC

## 2016-04-12 NOTE — Patient Instructions (Signed)
The procedure, we discussed today is called cryoablation of the anterior femoral cutaneous nerve as well as the infrapatellar branch of the saphenous nerve. This is to help with nerve pain. It is basically freezing  nerves that cause pain.  It can last around 3 months and can be repeated if needed.

## 2016-04-12 NOTE — Progress Notes (Signed)
Subjective:    Patient ID: Paige Arnold, female    DOB: 06-02-1941, 75 y.o.   MRN: [id number]  HPI 75 year old female with chronic low back pain. She has arthritic changes in her lumbar spine. However, her medial branch blocks were not helpful for her in February 2017. In addition. She's had neck pain. Nurse practitioner ordered. Neck x-rays, but these have not been completed. Patient states that she's had bronchitis for 3 weeks and her husband has been sick as well.  Patient notes some pins and needle sensation in both shoulders. This sensation comes and goes. It depends on her sitting or laying position. No new weakness in the arms. Patient with chronic constipation, but no other bowel or bladder issues.  Pain Inventory Average Pain 4 Pain Right Now 4 My pain is sharp, burning, dull, stabbing and aching  In the last 24 hours, has pain interfered with the following? General activity 1 Relation with others 1 Enjoyment of life 1 What TIME of day is your pain at its worst? all Sleep (in general) Fair  Pain is worse with: unsure Pain improves with: medication Relief from Meds: 5  Mobility walk with assistance use a cane ability to climb steps?  yes do you drive?  no  Function retired I need assistance with the following:  meal prep and household duties  Neuro/Psych trouble walking  Prior Studies Any changes since last visit?  no  Physicians involved in your care Any changes since last visit?  no   Family History  Problem Relation Age of Onset  . Kidney disease Mother   . Heart disease Father   . Diabetes Brother   . Alcohol abuse Brother   . Depression Brother   . Sarcoidosis Brother   . ALS Brother     Deceased, 42s  . Heart disease Brother   . Bipolar disorder Daughter    Social History   Social History  . Marital status: Married    Spouse name: N/A  . Number of children: 3  . Years of education: N/A   Occupational History  . retired    Social  History Main Topics  . Smoking status: Never Smoker  . Smokeless tobacco: Never Used  . Alcohol use No  . Drug use: No  . Sexual activity: Yes   Other Topics Concern  . None   Social History Narrative   She lives with husband and daughter.  Three grown children.   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     She took early retired at 56 because of problems of RSD.   Highest level of education:  Graduated high school      Past Surgical History:  Procedure Laterality Date  . AV FISTULA PLACEMENT Left 02/16/2015   Procedure: LEFT ARM BRACHIOCEPHALIC (AV) FISTULA CREATION;  Surgeon: Mal Misty, MD;  Location: [facility];  Service: Vascular;  Laterality: Left;  . CHOLECYSTECTOMY    . INTRAOCULAR LENS INSERTION Right 10-21-13  . LIGATION OF COMPETING BRANCHES OF ARTERIOVENOUS FISTULA Left 02/16/2015   Procedure: LIGATION OF COMPETING BRANCHES OF LEFT BRACHIOCEPHALIC ARTERIOVENOUS FISTULA;  Surgeon: Mal Misty, MD;  Location: [facility];  Service: Vascular;  Laterality: Left;  . PARTIAL HIP ARTHROPLASTY    . PARTIAL KNEE ARTHROPLASTY     left  . TEMPOROMANDIBULAR JOINT SURGERY    . TONSILLECTOMY    . TUBAL LIGATION     Past Medical History:  Diagnosis Date  . Arthritis   .  Diabetes mellitus   . Diabetic neuropathy ([place])   . GERD (gastroesophageal reflux disease)   . Hypertension   . Kidney disease   . Pneumonia Oct. 2016  . Shortness of breath dyspnea    when I first lay down, then it gets better  . Sleep apnea    does not wear CPAP   . Thyroid disorder    BP 118/78 (BP Location: Right Arm, Patient Position: Sitting, Cuff Size: Large)   Pulse (!) 57   Temp 97.8 F (36.6 C) (Oral)   Resp 14   SpO2 90%   Opioid Risk Score:   Fall Risk Score:  `1  Depression screen PHQ 2/9  Depression screen [facility] 2/9 02/06/2016 12/07/2015 10/03/2015 09/05/2015 02/03/2015 10/01/2014  Decreased Interest 0 0 3 0 1 1  Down, Depressed, Hopeless 0 0 3 0 2 2  PHQ - 2 Score 0 0 6 0 3  3  Altered sleeping - - - - 2 2  Tired, decreased energy - - - - 2 2  Change in appetite - - - - 1 1  Feeling bad or failure about yourself  - - - - 0 0  Trouble concentrating - - - - 1 1  Moving slowly or fidgety/restless - - - - 1 1  Suicidal thoughts - - - - 0 0  PHQ-9 Score - - - - 10 10     Review of Systems  Constitutional: Negative.   HENT: Negative.   Eyes: Negative.   Respiratory: Positive for cough.        Respiratory infection bronchitis  Gastrointestinal: Positive for constipation.  Endocrine: Negative.   Genitourinary: Positive for difficulty urinating.  Musculoskeletal: Positive for arthralgias, back pain and gait problem.  Allergic/Immunologic: Negative.   Hematological: Negative.   Psychiatric/Behavioral: Negative.   All other systems reviewed and are negative.      Objective:   Physical Exam  Constitutional: She is oriented to person, place, and time. She appears well-developed and well-nourished.  HENT:  Head: Normocephalic and atraumatic.  Eyes: Conjunctivae and EOM are normal. Pupils are equal, round, and reactive to light.  Neck: Normal range of motion.  Musculoskeletal:       Left knee: She exhibits no deformity and normal alignment. Tenderness found. Medial joint line, lateral joint line and patellar tendon tenderness noted.  Neurological: She is alert and oriented to person, place, and time. She displays no atrophy. No sensory deficit.  Psychiatric: She has a normal mood and affect.  Nursing note and vitals reviewed.   Left knee incision hypersensitive to touch him and no erythema, no drainage, well-healed  Decreased pinprick sensation right C6, left C6-C7 reduced Her strength is 5/5 bilateral deltoid, biceps, triceps, grip, hip flexor, knee extensor, ankle dorsiflexor    Assessment & Plan:  1. Lumbar spondylosis without positive response to medial branch blocks. Maintain on chronic analgesic medications, morphine sulfate 15 mg 3 times a  day Last urine screen was appropriate. 3 months ago Continue opioid monitoring program. This consists of regular clinic visits, examinations, urine drug screen, pill counts as well as use of [place] controlled substance reporting System.    2. Chronic postoperative pain, left knee appears to be neuropathic. We discussed Cryo ablation of the anterior femoral cutaneous nerve as well as the infrapatellar branch of the saphenous nerve. Patient has tried physical therapy, opiate and nonopiate medications without much improvement. She is not a candidate for knee intra-articular injection due to total knee replacement.  Patient is covered under workers compensation for this, we'll need to get approval  3. Neck pain. She does have some upper extremity sensation of tingling.

## 2016-04-17 ENCOUNTER — Other Ambulatory Visit: Payer: Self-pay | Admitting: Neurology

## 2016-04-17 NOTE — Telephone Encounter (Signed)
Rx sent 

## 2016-04-24 ENCOUNTER — Telehealth: Payer: Self-pay | Admitting: Registered Nurse

## 2016-04-24 NOTE — Telephone Encounter (Signed)
Placed a call to Mrs. Helminiak, regarding her X-ray results. Her husband states she was taking a shower. I asked for her to return my call in the morning. He verbalizes understanding.

## 2016-05-14 ENCOUNTER — Encounter: Payer: Self-pay | Admitting: Physical Medicine & Rehabilitation

## 2016-05-14 ENCOUNTER — Encounter: Payer: Worker's Compensation | Attending: Registered Nurse

## 2016-05-14 ENCOUNTER — Ambulatory Visit (HOSPITAL_BASED_OUTPATIENT_CLINIC_OR_DEPARTMENT_OTHER): Payer: Worker's Compensation | Admitting: Physical Medicine & Rehabilitation

## 2016-05-14 VITALS — BP 124/66 | HR 55 | Resp 14

## 2016-05-14 DIAGNOSIS — Z79899 Other long term (current) drug therapy: Secondary | ICD-10-CM

## 2016-05-14 DIAGNOSIS — M79642 Pain in left hand: Secondary | ICD-10-CM | POA: Insufficient documentation

## 2016-05-14 DIAGNOSIS — M79672 Pain in left foot: Secondary | ICD-10-CM | POA: Diagnosis not present

## 2016-05-14 DIAGNOSIS — Z760 Encounter for issue of repeat prescription: Secondary | ICD-10-CM | POA: Insufficient documentation

## 2016-05-14 DIAGNOSIS — G8929 Other chronic pain: Secondary | ICD-10-CM | POA: Diagnosis not present

## 2016-05-14 DIAGNOSIS — I10 Essential (primary) hypertension: Secondary | ICD-10-CM | POA: Diagnosis not present

## 2016-05-14 DIAGNOSIS — Z5181 Encounter for therapeutic drug level monitoring: Secondary | ICD-10-CM | POA: Diagnosis not present

## 2016-05-14 DIAGNOSIS — M25562 Pain in left knee: Secondary | ICD-10-CM

## 2016-05-14 DIAGNOSIS — M5030 Other cervical disc degeneration, unspecified cervical region: Secondary | ICD-10-CM

## 2016-05-14 DIAGNOSIS — M4800 Spinal stenosis, site unspecified: Secondary | ICD-10-CM | POA: Diagnosis not present

## 2016-05-14 DIAGNOSIS — E079 Disorder of thyroid, unspecified: Secondary | ICD-10-CM | POA: Insufficient documentation

## 2016-05-14 DIAGNOSIS — G894 Chronic pain syndrome: Secondary | ICD-10-CM | POA: Diagnosis not present

## 2016-05-14 DIAGNOSIS — M79641 Pain in right hand: Secondary | ICD-10-CM | POA: Insufficient documentation

## 2016-05-14 DIAGNOSIS — M1990 Unspecified osteoarthritis, unspecified site: Secondary | ICD-10-CM | POA: Diagnosis not present

## 2016-05-14 DIAGNOSIS — G8928 Other chronic postprocedural pain: Secondary | ICD-10-CM

## 2016-05-14 DIAGNOSIS — E1140 Type 2 diabetes mellitus with diabetic neuropathy, unspecified: Secondary | ICD-10-CM | POA: Insufficient documentation

## 2016-05-14 DIAGNOSIS — M545 Low back pain: Secondary | ICD-10-CM | POA: Diagnosis not present

## 2016-05-14 DIAGNOSIS — M79671 Pain in right foot: Secondary | ICD-10-CM | POA: Insufficient documentation

## 2016-05-14 DIAGNOSIS — N289 Disorder of kidney and ureter, unspecified: Secondary | ICD-10-CM | POA: Insufficient documentation

## 2016-05-14 DIAGNOSIS — M47812 Spondylosis without myelopathy or radiculopathy, cervical region: Secondary | ICD-10-CM | POA: Diagnosis not present

## 2016-05-14 MED ORDER — MORPHINE SULFATE 15 MG PO TABS: 15.0000 mg | ORAL_TABLET | Freq: Three times a day (TID) | ORAL | 0 refills | Status: DC

## 2016-05-14 NOTE — Patient Instructions (Signed)
Because the unit did not have adequate charge. We could not do the cryoablation today  We will plan to do this next month  Continue your current medications until that time

## 2016-05-14 NOTE — Progress Notes (Signed)
Paige Arnold returns today for cryoablation of the left anterior from medial cutaneous nerve as well as left infra patellar branch of the saphenous nerve. Unfortunately, the cryoablation device was not charged. We discussed with the patient that she can either weight and have the procedure in a couple hours or reschedule for next month. She would like to reschedule next one. She has chronic postoperative knee pain on the left side. She's had no changes in her symptoms.  On examination, she has no evidence of erythema Gen. no acute distress She ambulates without evidence of foot drop.  Impression is chronic postoperative knee pain. Mainly anterior Recommend cryoablation We'll reschedule for next month Continue current medications

## 2016-05-22 LAB — 6-ACETYLMORPHINE,TOXASSURE ADD
6-ACETYLMORPHINE: NEGATIVE
6-ACETYLMORPHINE: NOT DETECTED ng/mg{creat}

## 2016-05-22 LAB — TOXASSURE SELECT,+ANTIDEPR,UR

## 2016-05-23 NOTE — Progress Notes (Signed)
Urine drug screen for this encounter is consistent for prescribed medication 

## 2016-06-09 ENCOUNTER — Other Ambulatory Visit: Payer: Self-pay | Admitting: Neurology

## 2016-06-11 ENCOUNTER — Other Ambulatory Visit: Payer: Self-pay | Admitting: *Deleted

## 2016-06-11 ENCOUNTER — Ambulatory Visit: Payer: Self-pay | Admitting: Physical Medicine & Rehabilitation

## 2016-06-12 ENCOUNTER — Other Ambulatory Visit: Payer: Self-pay | Admitting: Neurology

## 2016-06-19 ENCOUNTER — Encounter: Payer: Worker's Compensation | Admitting: Registered Nurse

## 2016-06-20 ENCOUNTER — Encounter: Payer: Self-pay | Admitting: Registered Nurse

## 2016-06-20 ENCOUNTER — Encounter: Payer: Worker's Compensation | Attending: Registered Nurse | Admitting: Registered Nurse

## 2016-06-20 VITALS — BP 133/71 | HR 60

## 2016-06-20 DIAGNOSIS — M545 Low back pain: Secondary | ICD-10-CM | POA: Insufficient documentation

## 2016-06-20 DIAGNOSIS — N289 Disorder of kidney and ureter, unspecified: Secondary | ICD-10-CM | POA: Diagnosis not present

## 2016-06-20 DIAGNOSIS — M48061 Spinal stenosis, lumbar region without neurogenic claudication: Secondary | ICD-10-CM

## 2016-06-20 DIAGNOSIS — M1990 Unspecified osteoarthritis, unspecified site: Secondary | ICD-10-CM | POA: Diagnosis not present

## 2016-06-20 DIAGNOSIS — M79641 Pain in right hand: Secondary | ICD-10-CM | POA: Insufficient documentation

## 2016-06-20 DIAGNOSIS — G8929 Other chronic pain: Secondary | ICD-10-CM | POA: Insufficient documentation

## 2016-06-20 DIAGNOSIS — M25562 Pain in left knee: Secondary | ICD-10-CM | POA: Diagnosis not present

## 2016-06-20 DIAGNOSIS — Z79899 Other long term (current) drug therapy: Secondary | ICD-10-CM

## 2016-06-20 DIAGNOSIS — E079 Disorder of thyroid, unspecified: Secondary | ICD-10-CM | POA: Diagnosis not present

## 2016-06-20 DIAGNOSIS — M4800 Spinal stenosis, site unspecified: Secondary | ICD-10-CM | POA: Insufficient documentation

## 2016-06-20 DIAGNOSIS — E1140 Type 2 diabetes mellitus with diabetic neuropathy, unspecified: Secondary | ICD-10-CM | POA: Diagnosis not present

## 2016-06-20 DIAGNOSIS — Z5181 Encounter for therapeutic drug level monitoring: Secondary | ICD-10-CM

## 2016-06-20 DIAGNOSIS — G894 Chronic pain syndrome: Secondary | ICD-10-CM

## 2016-06-20 DIAGNOSIS — E1149 Type 2 diabetes mellitus with other diabetic neurological complication: Secondary | ICD-10-CM

## 2016-06-20 DIAGNOSIS — M79672 Pain in left foot: Secondary | ICD-10-CM | POA: Diagnosis not present

## 2016-06-20 DIAGNOSIS — G90522 Complex regional pain syndrome I of left lower limb: Secondary | ICD-10-CM

## 2016-06-20 DIAGNOSIS — Z760 Encounter for issue of repeat prescription: Secondary | ICD-10-CM | POA: Insufficient documentation

## 2016-06-20 DIAGNOSIS — M79642 Pain in left hand: Secondary | ICD-10-CM | POA: Insufficient documentation

## 2016-06-20 DIAGNOSIS — I10 Essential (primary) hypertension: Secondary | ICD-10-CM | POA: Diagnosis not present

## 2016-06-20 DIAGNOSIS — M79671 Pain in right foot: Secondary | ICD-10-CM | POA: Insufficient documentation

## 2016-06-20 MED ORDER — MORPHINE SULFATE 15 MG PO TABS: 15.0000 mg | ORAL_TABLET | Freq: Three times a day (TID) | ORAL | 0 refills | Status: DC

## 2016-06-20 NOTE — Progress Notes (Signed)
Subjective:    Patient ID: Paige Arnold, female    DOB: Dec 06, 1940, 75 y.o.   MRN: [id number]  HPI: Paige Arnold is a 75 year old female who returns for follow up for chronic pain and medication refill. She states her pain is located in her upper -lower back andleft knee.She rates her pain 5. Her current exercise regime is walking short distances with cane or walker.  Pain Inventory Average Pain 6 Pain Right Now 5 My pain is constant, sharp, burning, dull, stabbing, tingling and aching  In the last 24 hours, has pain interfered with the following? General activity 2 Relation with others 1 Enjoyment of life 5 What TIME of day is your pain at its worst? all Sleep (in general) Fair  Pain is worse with: walking, bending, standing and some activites Pain improves with: rest, heat/ice and medication Relief from Meds: 5  Mobility use a cane ability to climb steps?  yes do you drive?  no needs help with transfers  Function retired I need assistance with the following:  meal prep, household duties and shopping  Neuro/Psych No problems in this area  Prior Studies Any changes since last visit?  no  Physicians involved in your care Any changes since last visit?  no   Family History  Problem Relation Age of Onset  . Kidney disease Mother   . Heart disease Father   . Diabetes Brother   . Alcohol abuse Brother   . Depression Brother   . Sarcoidosis Brother   . ALS Brother     Deceased, 76s  . Heart disease Brother   . Bipolar disorder Daughter    Social History   Social History  . Marital status: Married    Spouse name: N/A  . Number of children: 3  . Years of education: N/A   Occupational History  . retired    Social History Main Topics  . Smoking status: Never Smoker  . Smokeless tobacco: Never Used  . Alcohol use No  . Drug use: No  . Sexual activity: Yes   Other Topics Concern  . Not on file   Social History Narrative   She lives with husband  and daughter.  Three grown children.   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     She took early retired at 36 because of problems of RSD.   Highest level of education:  Graduated high school      Past Surgical History:  Procedure Laterality Date  . AV FISTULA PLACEMENT Left 02/16/2015   Procedure: LEFT ARM BRACHIOCEPHALIC (AV) FISTULA CREATION;  Surgeon: Mal Misty, MD;  Location: [facility];  Service: Vascular;  Laterality: Left;  . CHOLECYSTECTOMY    . INTRAOCULAR LENS INSERTION Right 10-21-13  . LIGATION OF COMPETING BRANCHES OF ARTERIOVENOUS FISTULA Left 02/16/2015   Procedure: LIGATION OF COMPETING BRANCHES OF LEFT BRACHIOCEPHALIC ARTERIOVENOUS FISTULA;  Surgeon: Mal Misty, MD;  Location: [facility];  Service: Vascular;  Laterality: Left;  . PARTIAL HIP ARTHROPLASTY    . PARTIAL KNEE ARTHROPLASTY     left  . TEMPOROMANDIBULAR JOINT SURGERY    . TONSILLECTOMY    . TUBAL LIGATION     Past Medical History:  Diagnosis Date  . Arthritis   . Diabetes mellitus   . Diabetic neuropathy ([place])   . GERD (gastroesophageal reflux disease)   . Hypertension   . Kidney disease   . Pneumonia Oct. 2016  . Shortness of  breath dyspnea    when I first lay down, then it gets better  . Sleep apnea    does not wear CPAP   . Thyroid disorder    There were no vitals taken for this visit.  Opioid Risk Score:   Fall Risk Score:  `1  Depression screen PHQ 2/9  Depression screen [facility] 2/9 02/06/2016 12/07/2015 10/03/2015 09/05/2015 02/03/2015 10/01/2014  Decreased Interest 0 0 3 0 1 1  Down, Depressed, Hopeless 0 0 3 0 2 2  PHQ - 2 Score 0 0 6 0 3 3  Altered sleeping - - - - 2 2  Tired, decreased energy - - - - 2 2  Change in appetite - - - - 1 1  Feeling bad or failure about yourself  - - - - 0 0  Trouble concentrating - - - - 1 1  Moving slowly or fidgety/restless - - - - 1 1  Suicidal thoughts - - - - 0 0  PHQ-9 Score - - - - 10 10   Review of Systems  Constitutional:  Positive for unexpected weight change.  HENT: Negative.   Eyes: Negative.   Respiratory: Negative.   Cardiovascular: Negative.   Gastrointestinal: Positive for constipation.  Endocrine: Negative.   Genitourinary: Negative.   Musculoskeletal: Positive for back pain and joint swelling.  Skin: Positive for rash.  Allergic/Immunologic: Negative.   Neurological: Negative.   Hematological: Negative.   Psychiatric/Behavioral: Negative.   All other systems reviewed and are negative.      Objective:   Physical Exam  Constitutional: She is oriented to person, place, and time. She appears well-developed and well-nourished.  HENT:  Head: Normocephalic and atraumatic.  Neck: Normal range of motion. Neck supple.  Cardiovascular: Normal rate and regular rhythm.   Pulmonary/Chest: Effort normal and breath sounds normal.  Musculoskeletal:  Normal Muscle Bulk and Muscle Testing Reveals: Upper Extremities: Full ROM and Muscle Strength 5/5 Thoracic Paraspinal Tenderness: T-1-T-3 Lumbar Paraspinal Tenderness: L-4-L-5 Lower Extremities: Full ROM and Muscle Strength 5/5 Right Lower Extremity Flexion Produces Pain into Lumbar Left Lower Extremity Flexion Produces Pain into Left Patella Arises from Table slowly using straight cane for support Antalgic gait   Neurological: She is alert and oriented to person, place, and time.  Skin: Skin is warm and dry.  Psychiatric: She has a normal mood and affect.  Nursing note and vitals reviewed.         Assessment & Plan:  1. Spinal stenosis chronic low back pain radiating into the legs: Continue MSIR 15 mg take one tablet by mouththree times daily #90. We will continue the opioid monitoring program, this consists of regular clinic visits, examinations, urine drug screen, pill counts as well as use of [place] Controlled Substance Reporting System. 2. Diabetic Neuropathy: Continue Gabapentin  3. Left Knee Pain:  Scheduled for Left Knee  Cryoblation with Dr. Letta Pate. Continue Current Medication and exercise Regimen.  ( RSD) Continue Lidoderm Patches. 4. Left Hip Fracture S/P hemiarthroplasty: Orthopedist Following.  20 minutes of face to face patient care time was spent during this visit. All questions were encouraged and answered.   F/U in 1 month

## 2016-07-23 ENCOUNTER — Ambulatory Visit (HOSPITAL_BASED_OUTPATIENT_CLINIC_OR_DEPARTMENT_OTHER): Payer: Worker's Compensation | Admitting: Physical Medicine & Rehabilitation

## 2016-07-23 ENCOUNTER — Encounter: Payer: Self-pay | Admitting: Physical Medicine & Rehabilitation

## 2016-07-23 ENCOUNTER — Encounter: Payer: Worker's Compensation | Attending: Registered Nurse

## 2016-07-23 VITALS — BP 116/69 | HR 55 | Resp 14

## 2016-07-23 DIAGNOSIS — I10 Essential (primary) hypertension: Secondary | ICD-10-CM | POA: Insufficient documentation

## 2016-07-23 DIAGNOSIS — M1990 Unspecified osteoarthritis, unspecified site: Secondary | ICD-10-CM | POA: Insufficient documentation

## 2016-07-23 DIAGNOSIS — E079 Disorder of thyroid, unspecified: Secondary | ICD-10-CM | POA: Diagnosis not present

## 2016-07-23 DIAGNOSIS — M79671 Pain in right foot: Secondary | ICD-10-CM | POA: Insufficient documentation

## 2016-07-23 DIAGNOSIS — G8929 Other chronic pain: Secondary | ICD-10-CM | POA: Insufficient documentation

## 2016-07-23 DIAGNOSIS — M25562 Pain in left knee: Secondary | ICD-10-CM

## 2016-07-23 DIAGNOSIS — M79672 Pain in left foot: Secondary | ICD-10-CM | POA: Insufficient documentation

## 2016-07-23 DIAGNOSIS — M545 Low back pain: Secondary | ICD-10-CM | POA: Diagnosis not present

## 2016-07-23 DIAGNOSIS — Z5181 Encounter for therapeutic drug level monitoring: Secondary | ICD-10-CM

## 2016-07-23 DIAGNOSIS — N289 Disorder of kidney and ureter, unspecified: Secondary | ICD-10-CM | POA: Diagnosis not present

## 2016-07-23 DIAGNOSIS — Z760 Encounter for issue of repeat prescription: Secondary | ICD-10-CM | POA: Insufficient documentation

## 2016-07-23 DIAGNOSIS — Z79899 Other long term (current) drug therapy: Secondary | ICD-10-CM

## 2016-07-23 DIAGNOSIS — E1140 Type 2 diabetes mellitus with diabetic neuropathy, unspecified: Secondary | ICD-10-CM | POA: Insufficient documentation

## 2016-07-23 DIAGNOSIS — M79641 Pain in right hand: Secondary | ICD-10-CM | POA: Insufficient documentation

## 2016-07-23 DIAGNOSIS — M79642 Pain in left hand: Secondary | ICD-10-CM | POA: Insufficient documentation

## 2016-07-23 DIAGNOSIS — M48061 Spinal stenosis, lumbar region without neurogenic claudication: Secondary | ICD-10-CM

## 2016-07-23 DIAGNOSIS — G894 Chronic pain syndrome: Secondary | ICD-10-CM

## 2016-07-23 DIAGNOSIS — M4800 Spinal stenosis, site unspecified: Secondary | ICD-10-CM | POA: Diagnosis not present

## 2016-07-23 MED ORDER — MORPHINE SULFATE 15 MG PO TABS: 15.0000 mg | ORAL_TABLET | Freq: Three times a day (TID) | ORAL | 0 refills | Status: DC

## 2016-07-23 NOTE — Addendum Note (Signed)
Addended by: Elinor Parkinson I on: 07/23/2016 03:38 PM   Modules accepted: Orders

## 2016-07-23 NOTE — Patient Instructions (Addendum)
Please report to Paige Arnold next month whether her knee pain was temporarily relieved by the nerve blocks that we did on your knee.  If it was we could potentially try the cryoablation after giving you a tranquilizer to help relax you.

## 2016-07-23 NOTE — Procedures (Signed)
Left anterior femoral cutaneous nerve and left infrapatellar branch of the saphenous nerve blocks   Informed consent was obtained after describing risks and benefits. The procedure, patient lacks proceed and has given written consent. Patient placed in a semirecumbent position. Left knee was exposed. 10 cm superior to the superior pole of the patella measuring the width of the patella was drawn. In addition, a line was drawn 5 cm medial to the tibial tubercle extending 5 cm inferiorly. Ultrasound images were obtained and confirmed muscle adipose depth at 2.5 centimeters in the thigh and 2 cm in the medial leg. A 25-gauge 1.5 inch needle was used to anesthetize skin and subcutaneous tissue. 10 cc was in full treated in the thigh and 5 cc, was infiltrated in the leg.  Cryoablation was planned as well, but patient did not wish to proceed after peripheral nerve blocks were performed.  Patient instructed to track pain scores during the day. Report next month to nurse practitioner. If she had a good improvement in pain lasting approximately 8 hours, she may be a good candidate for cryoablation done after sedation with oral Valium.

## 2016-07-29 LAB — 6-ACETYLMORPHINE,TOXASSURE ADD
6-ACETYLMORPHINE: NEGATIVE
6-acetylmorphine: NOT DETECTED ng/mg creat

## 2016-07-29 LAB — TOXASSURE SELECT,+ANTIDEPR,UR

## 2016-07-30 NOTE — Progress Notes (Signed)
Urine drug screen for this encounter is consistent for prescribed medication 

## 2016-08-07 ENCOUNTER — Other Ambulatory Visit: Payer: Self-pay | Admitting: Neurology

## 2016-08-22 ENCOUNTER — Encounter: Payer: Worker's Compensation | Attending: Registered Nurse | Admitting: Registered Nurse

## 2016-08-22 ENCOUNTER — Encounter: Payer: Self-pay | Admitting: Registered Nurse

## 2016-08-22 ENCOUNTER — Telehealth: Payer: Self-pay | Admitting: Registered Nurse

## 2016-08-22 VITALS — BP 116/64 | HR 67

## 2016-08-22 DIAGNOSIS — M545 Low back pain: Secondary | ICD-10-CM | POA: Diagnosis not present

## 2016-08-22 DIAGNOSIS — G894 Chronic pain syndrome: Secondary | ICD-10-CM

## 2016-08-22 DIAGNOSIS — Z760 Encounter for issue of repeat prescription: Secondary | ICD-10-CM | POA: Insufficient documentation

## 2016-08-22 DIAGNOSIS — M4800 Spinal stenosis, site unspecified: Secondary | ICD-10-CM | POA: Insufficient documentation

## 2016-08-22 DIAGNOSIS — I10 Essential (primary) hypertension: Secondary | ICD-10-CM | POA: Diagnosis not present

## 2016-08-22 DIAGNOSIS — E1140 Type 2 diabetes mellitus with diabetic neuropathy, unspecified: Secondary | ICD-10-CM | POA: Diagnosis not present

## 2016-08-22 DIAGNOSIS — M48061 Spinal stenosis, lumbar region without neurogenic claudication: Secondary | ICD-10-CM

## 2016-08-22 DIAGNOSIS — G8929 Other chronic pain: Secondary | ICD-10-CM | POA: Diagnosis not present

## 2016-08-22 DIAGNOSIS — M79671 Pain in right foot: Secondary | ICD-10-CM | POA: Diagnosis not present

## 2016-08-22 DIAGNOSIS — M25562 Pain in left knee: Secondary | ICD-10-CM | POA: Diagnosis not present

## 2016-08-22 DIAGNOSIS — M79642 Pain in left hand: Secondary | ICD-10-CM | POA: Diagnosis not present

## 2016-08-22 DIAGNOSIS — E079 Disorder of thyroid, unspecified: Secondary | ICD-10-CM | POA: Diagnosis not present

## 2016-08-22 DIAGNOSIS — M79672 Pain in left foot: Secondary | ICD-10-CM | POA: Insufficient documentation

## 2016-08-22 DIAGNOSIS — M1990 Unspecified osteoarthritis, unspecified site: Secondary | ICD-10-CM | POA: Insufficient documentation

## 2016-08-22 DIAGNOSIS — M79641 Pain in right hand: Secondary | ICD-10-CM | POA: Insufficient documentation

## 2016-08-22 DIAGNOSIS — N289 Disorder of kidney and ureter, unspecified: Secondary | ICD-10-CM | POA: Insufficient documentation

## 2016-08-22 DIAGNOSIS — Z5181 Encounter for therapeutic drug level monitoring: Secondary | ICD-10-CM

## 2016-08-22 DIAGNOSIS — Z79899 Other long term (current) drug therapy: Secondary | ICD-10-CM

## 2016-08-22 MED ORDER — MORPHINE SULFATE 15 MG PO TABS: 15.0000 mg | ORAL_TABLET | Freq: Three times a day (TID) | ORAL | 0 refills | Status: DC

## 2016-08-22 NOTE — Progress Notes (Signed)
Subjective:    Patient ID: Paige Arnold, female    DOB: 07/19/1940, 76 y.o.   MRN: [id number]  HPI: Paige Arnold is a 76 year old female who returns for follow up appointment for chronic pain and medication refill. She states her pain is located in her lower back andleft knee.She rates her pain 5. Her current exercise regime is walking short distances with cane or walker. S/P Peripheral Nerve Block she received relief for 6-8 hours. Also states she's not interested in receiving cryoablation due to needles.   Pain Inventory Average Pain 5 Pain Right Now 5 My pain is sharp, burning, dull, stabbing, tingling and aching  In the last 24 hours, has pain interfered with the following? General activity 2 Relation with others 1 Enjoyment of life 5 What TIME of day is your pain at its worst? All Sleep (in general) n/a  Pain is worse with: walking, bending, standing and some activites Pain improves with: rest, heat/ice and medication Relief from Meds: 5  Mobility use a cane  Function retired I need assistance with the following:  meal prep, household duties and shopping  Neuro/Psych No problems in this area  Prior Studies Any changes since last visit?  no  Physicians involved in your care Any changes since last visit?  no   Family History  Problem Relation Age of Onset  . Kidney disease Mother   . Heart disease Father   . Diabetes Brother   . Alcohol abuse Brother   . Depression Brother   . Sarcoidosis Brother   . ALS Brother     Deceased, 1s  . Heart disease Brother   . Bipolar disorder Daughter    Social History   Social History  . Marital status: Married    Spouse name: N/A  . Number of children: 3  . Years of education: N/A   Occupational History  . retired    Social History Main Topics  . Smoking status: Never Smoker  . Smokeless tobacco: Never Used  . Alcohol use No  . Drug use: No  . Sexual activity: Yes   Other Topics Concern  . None    Social History Narrative   She lives with husband and daughter.  Three grown children.   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   Highest level of education:  Graduated high school      Past Surgical History:  Procedure Laterality Date  . AV FISTULA PLACEMENT Left 02/16/2015   Procedure: LEFT ARM BRACHIOCEPHALIC (AV) FISTULA CREATION;  Surgeon: Mal Misty, MD;  Location: [facility];  Service: Vascular;  Laterality: Left;  . CHOLECYSTECTOMY    . INTRAOCULAR LENS INSERTION Right 10-21-13  . LIGATION OF COMPETING BRANCHES OF ARTERIOVENOUS FISTULA Left 02/16/2015   Procedure: LIGATION OF COMPETING BRANCHES OF LEFT BRACHIOCEPHALIC ARTERIOVENOUS FISTULA;  Surgeon: Mal Misty, MD;  Location: [facility];  Service: Vascular;  Laterality: Left;  . PARTIAL HIP ARTHROPLASTY    . PARTIAL KNEE ARTHROPLASTY     left  . TEMPOROMANDIBULAR JOINT SURGERY    . TONSILLECTOMY    . TUBAL LIGATION     Past Medical History:  Diagnosis Date  . Arthritis   . Diabetes mellitus   . Diabetic neuropathy ([place])   . GERD (gastroesophageal reflux disease)   . Hypertension   . Kidney disease   . Pneumonia Oct. 2016  .  Shortness of breath dyspnea    when I first lay down, then it gets better  . Sleep apnea    does not wear CPAP   . Thyroid disorder    BP 116/64 (BP Location: Right Arm, Patient Position: Sitting, Cuff Size: Normal)   Pulse 67   SpO2 96%   Opioid Risk Score:   Fall Risk Score:  `1  Depression screen PHQ 2/9  Depression screen [facility] 2/9 02/06/2016 12/07/2015 10/03/2015 09/05/2015 02/03/2015 10/01/2014  Decreased Interest 0 0 3 0 1 1  Down, Depressed, Hopeless 0 0 3 0 2 2  PHQ - 2 Score 0 0 6 0 3 3  Altered sleeping - - - - 2 2  Tired, decreased energy - - - - 2 2  Change in appetite - - - - 1 1  Feeling bad or failure about yourself  - - - - 0 0  Trouble concentrating - - - - 1 1  Moving slowly or fidgety/restless  - - - - 1 1  Suicidal thoughts - - - - 0 0  PHQ-9 Score - - - - 10 10     Review of Systems  Constitutional:       Weight gain  HENT: Negative.   Eyes: Negative.   Respiratory: Negative.   Cardiovascular: Negative.   Gastrointestinal: Positive for constipation.  Endocrine: Negative.   Genitourinary: Positive for dysuria.  Musculoskeletal: Negative.   Skin: Negative.   Allergic/Immunologic: Negative.   Neurological: Negative.   Hematological: Negative.   Psychiatric/Behavioral: Negative.   All other systems reviewed and are negative.      Objective:   Physical Exam  Constitutional: She is oriented to person, place, and time. She appears well-developed and well-nourished.  HENT:  Head: Normocephalic and atraumatic.  Neck: Normal range of motion. Neck supple.  Cardiovascular: Normal rate and regular rhythm.   Pulmonary/Chest: Effort normal and breath sounds normal.  Musculoskeletal:  Normal Muscle Bulk and Muscle Testing Reveals: Upper Extremities: Full ROM and Muscle Strength 5/5 Thoracic Paraspinal Tenderness: T-1-T-3 Lumbar Paraspinal Tenderness: L-3-L-5 Lower Extremities: Full ROM and Muscle Strength 5/5 Left Lower Extremity Flexion Produces Pain into Left Patella Arises from Table Slowly using Straight Cane for Support Antalgic gait   Neurological: She is alert and oriented to person, place, and time.  Skin: Skin is warm and dry.  Psychiatric: She has a normal mood and affect.  Nursing note and vitals reviewed.         Assessment & Plan:  1. Spinal stenosis chronic low back pain radiating into the legs:08/22/2016:  Refilled: MSIR 15 mg take one tablet by mouththree times daily #90. We will continue the opioid monitoring program, this consists of regular clinic visits, examinations, urine drug screen, pill counts as well as use of [place] Controlled Substance Reporting System. 2. Diabetic Neuropathy: Continue Gabapentin.08/22/2016  3. Left Knee  Pain:  S/P Peripheral Nerve Block received 6-8 hours of relief. Continue Current Medication and exercise Regimen. Ms. Kunz refuses Cryoablation. ( RSD) Continue Lidoderm Patches.-08/22/2016 4. Left Hip Fracture S/P hemiarthroplasty: Orthopedist Following. 08/22/2016  20 minutes of face to face patient care time was spent during this visit. All questions were encouraged and answered.  F/U in 1 month

## 2016-08-22 NOTE — Telephone Encounter (Signed)
On 08/22/2016 the [place] was reviewed no conflict was seen on the [place] with multiple prescribers. Paige Arnold has a signed narcotic contract with our office. If there were any discrepancies this would have been reported to her physician.

## 2016-08-23 ENCOUNTER — Other Ambulatory Visit: Payer: Self-pay | Admitting: Neurology

## 2016-08-27 ENCOUNTER — Ambulatory Visit: Payer: Self-pay | Admitting: Neurology

## 2016-08-31 ENCOUNTER — Encounter: Payer: Self-pay | Admitting: Neurology

## 2016-09-13 ENCOUNTER — Encounter: Payer: Worker's Compensation | Attending: Registered Nurse | Admitting: Registered Nurse

## 2016-09-13 DIAGNOSIS — Z760 Encounter for issue of repeat prescription: Secondary | ICD-10-CM | POA: Insufficient documentation

## 2016-09-13 DIAGNOSIS — E1140 Type 2 diabetes mellitus with diabetic neuropathy, unspecified: Secondary | ICD-10-CM | POA: Insufficient documentation

## 2016-09-13 DIAGNOSIS — M545 Low back pain: Secondary | ICD-10-CM | POA: Insufficient documentation

## 2016-09-13 DIAGNOSIS — Z8249 Family history of ischemic heart disease and other diseases of the circulatory system: Secondary | ICD-10-CM | POA: Insufficient documentation

## 2016-09-13 DIAGNOSIS — Z9889 Other specified postprocedural states: Secondary | ICD-10-CM | POA: Insufficient documentation

## 2016-09-13 DIAGNOSIS — G4730 Sleep apnea, unspecified: Secondary | ICD-10-CM | POA: Insufficient documentation

## 2016-09-13 DIAGNOSIS — Z8489 Family history of other specified conditions: Secondary | ICD-10-CM | POA: Insufficient documentation

## 2016-09-13 DIAGNOSIS — N289 Disorder of kidney and ureter, unspecified: Secondary | ICD-10-CM | POA: Insufficient documentation

## 2016-09-13 DIAGNOSIS — Z9049 Acquired absence of other specified parts of digestive tract: Secondary | ICD-10-CM | POA: Insufficient documentation

## 2016-09-13 DIAGNOSIS — K219 Gastro-esophageal reflux disease without esophagitis: Secondary | ICD-10-CM | POA: Insufficient documentation

## 2016-09-13 DIAGNOSIS — M25562 Pain in left knee: Secondary | ICD-10-CM | POA: Insufficient documentation

## 2016-09-13 DIAGNOSIS — Z833 Family history of diabetes mellitus: Secondary | ICD-10-CM | POA: Insufficient documentation

## 2016-09-13 DIAGNOSIS — E079 Disorder of thyroid, unspecified: Secondary | ICD-10-CM | POA: Insufficient documentation

## 2016-09-13 DIAGNOSIS — Z9851 Tubal ligation status: Secondary | ICD-10-CM | POA: Insufficient documentation

## 2016-09-13 DIAGNOSIS — M4800 Spinal stenosis, site unspecified: Secondary | ICD-10-CM | POA: Insufficient documentation

## 2016-09-13 DIAGNOSIS — I10 Essential (primary) hypertension: Secondary | ICD-10-CM | POA: Insufficient documentation

## 2016-09-13 DIAGNOSIS — G8929 Other chronic pain: Secondary | ICD-10-CM | POA: Insufficient documentation

## 2016-09-17 ENCOUNTER — Encounter (HOSPITAL_BASED_OUTPATIENT_CLINIC_OR_DEPARTMENT_OTHER): Payer: Worker's Compensation | Admitting: Registered Nurse

## 2016-09-17 ENCOUNTER — Encounter: Payer: Self-pay | Admitting: Registered Nurse

## 2016-09-17 VITALS — BP 146/75 | HR 56 | Resp 14

## 2016-09-17 DIAGNOSIS — Z760 Encounter for issue of repeat prescription: Secondary | ICD-10-CM | POA: Diagnosis not present

## 2016-09-17 DIAGNOSIS — M545 Low back pain: Secondary | ICD-10-CM | POA: Diagnosis not present

## 2016-09-17 DIAGNOSIS — Z833 Family history of diabetes mellitus: Secondary | ICD-10-CM | POA: Diagnosis not present

## 2016-09-17 DIAGNOSIS — Z5181 Encounter for therapeutic drug level monitoring: Secondary | ICD-10-CM

## 2016-09-17 DIAGNOSIS — M25562 Pain in left knee: Secondary | ICD-10-CM

## 2016-09-17 DIAGNOSIS — N289 Disorder of kidney and ureter, unspecified: Secondary | ICD-10-CM | POA: Diagnosis not present

## 2016-09-17 DIAGNOSIS — Z8249 Family history of ischemic heart disease and other diseases of the circulatory system: Secondary | ICD-10-CM | POA: Diagnosis not present

## 2016-09-17 DIAGNOSIS — G894 Chronic pain syndrome: Secondary | ICD-10-CM | POA: Diagnosis not present

## 2016-09-17 DIAGNOSIS — Z79899 Other long term (current) drug therapy: Secondary | ICD-10-CM

## 2016-09-17 DIAGNOSIS — Z8489 Family history of other specified conditions: Secondary | ICD-10-CM | POA: Diagnosis not present

## 2016-09-17 DIAGNOSIS — E079 Disorder of thyroid, unspecified: Secondary | ICD-10-CM | POA: Diagnosis not present

## 2016-09-17 DIAGNOSIS — G8929 Other chronic pain: Secondary | ICD-10-CM

## 2016-09-17 DIAGNOSIS — M48061 Spinal stenosis, lumbar region without neurogenic claudication: Secondary | ICD-10-CM | POA: Diagnosis not present

## 2016-09-17 DIAGNOSIS — Z5189 Encounter for other specified aftercare: Secondary | ICD-10-CM | POA: Diagnosis present

## 2016-09-17 DIAGNOSIS — Z9889 Other specified postprocedural states: Secondary | ICD-10-CM | POA: Diagnosis not present

## 2016-09-17 DIAGNOSIS — G4730 Sleep apnea, unspecified: Secondary | ICD-10-CM | POA: Diagnosis not present

## 2016-09-17 DIAGNOSIS — Z9851 Tubal ligation status: Secondary | ICD-10-CM | POA: Diagnosis not present

## 2016-09-17 DIAGNOSIS — E1140 Type 2 diabetes mellitus with diabetic neuropathy, unspecified: Secondary | ICD-10-CM | POA: Diagnosis not present

## 2016-09-17 DIAGNOSIS — I10 Essential (primary) hypertension: Secondary | ICD-10-CM | POA: Diagnosis not present

## 2016-09-17 DIAGNOSIS — G90522 Complex regional pain syndrome I of left lower limb: Secondary | ICD-10-CM

## 2016-09-17 DIAGNOSIS — K219 Gastro-esophageal reflux disease without esophagitis: Secondary | ICD-10-CM | POA: Diagnosis not present

## 2016-09-17 DIAGNOSIS — M4800 Spinal stenosis, site unspecified: Secondary | ICD-10-CM | POA: Diagnosis not present

## 2016-09-17 DIAGNOSIS — Z9049 Acquired absence of other specified parts of digestive tract: Secondary | ICD-10-CM | POA: Diagnosis not present

## 2016-09-17 MED ORDER — MORPHINE SULFATE 15 MG PO TABS: 15.0000 mg | ORAL_TABLET | Freq: Three times a day (TID) | ORAL | 0 refills | Status: DC

## 2016-09-17 NOTE — Progress Notes (Signed)
Subjective:    Patient ID: Paige Arnold, female    DOB: June 27, 1941, 76 y.o.   MRN: [id number]  HPI:  [name] is a 76 year old female who returns for follow up appointment for chronic pain and medication refill. She states her pain is located in her lower back andleft knee.She rates her pain 6. Her current exercise regime is walking short distances with cane or walker.  Pain Inventory Average Pain 6 Pain Right Now 6 My pain is sharp, burning, dull, stabbing, tingling and aching  In the last 24 hours, has pain interfered with the following? General activity 1 Relation with others 1 Enjoyment of life 5 What TIME of day is your pain at its worst? morning, daytime, evening Sleep (in general) Fair  Pain is worse with: walking, bending, standing and some activites Pain improves with: rest, heat/ice and medication Relief from Meds: 4  Mobility walk with assistance use a cane  Function retired I need assistance with the following:  meal prep, household duties and shopping  Neuro/Psych No problems in this area  Prior Studies Any changes since last visit?  no  Physicians involved in your care Any changes since last visit?  no   Family History  Problem Relation Age of Onset  . Kidney disease Mother   . Heart disease Father   . Diabetes Brother   . Alcohol abuse Brother   . Depression Brother   . Sarcoidosis Brother   . ALS Brother     Deceased, 63s  . Heart disease Brother   . Bipolar disorder Daughter    Social History   Social History  . Marital status: Married    Spouse name: N/A  . Number of children: 3  . Years of education: N/A   Occupational History  . retired    Social History Main Topics  . Smoking status: Never Smoker  . Smokeless tobacco: Never Used  . Alcohol use No  . Drug use: No  . Sexual activity: Yes   Other Topics Concern  . None   Social History Narrative   She lives with husband and daughter.  Three grown children.   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     She took early retired at 31 because of problems of RSD.   Highest level of education:  Graduated high school      Past Surgical History:  Procedure Laterality Date  . AV FISTULA PLACEMENT Left 02/16/2015   Procedure: LEFT ARM BRACHIOCEPHALIC (AV) FISTULA CREATION;  Surgeon: Mal Misty, MD;  Location: [facility];  Service: Vascular;  Laterality: Left;  . CHOLECYSTECTOMY    . INTRAOCULAR LENS INSERTION Right 10-21-13  . LIGATION OF COMPETING BRANCHES OF ARTERIOVENOUS FISTULA Left 02/16/2015   Procedure: LIGATION OF COMPETING BRANCHES OF LEFT BRACHIOCEPHALIC ARTERIOVENOUS FISTULA;  Surgeon: Mal Misty, MD;  Location: [facility];  Service: Vascular;  Laterality: Left;  . PARTIAL HIP ARTHROPLASTY    . PARTIAL KNEE ARTHROPLASTY     left  . TEMPOROMANDIBULAR JOINT SURGERY    . TONSILLECTOMY    . TUBAL LIGATION     Past Medical History:  Diagnosis Date  . Arthritis   . Diabetes mellitus   . Diabetic neuropathy ([place])   . GERD (gastroesophageal reflux disease)   . Hypertension   . Kidney disease   . Pneumonia Oct. 2016  . Shortness of breath dyspnea    when I first lay down, then it gets  better  . Sleep apnea    does not wear CPAP   . Thyroid disorder    BP (!) 149/70 (BP Location: Right Arm, Patient Position: Sitting, Cuff Size: Large)   Pulse (!) 57   Resp 14   SpO2 93%   Opioid Risk Score:   Fall Risk Score:  `1  Depression screen PHQ 2/9  Depression screen [facility] 2/9 02/06/2016 12/07/2015 10/03/2015 09/05/2015 02/03/2015 10/01/2014  Decreased Interest 0 0 3 0 1 1  Down, Depressed, Hopeless 0 0 3 0 2 2  PHQ - 2 Score 0 0 6 0 3 3  Altered sleeping - - - - 2 2  Tired, decreased energy - - - - 2 2  Change in appetite - - - - 1 1  Feeling bad or failure about yourself  - - - - 0 0  Trouble concentrating - - - - 1 1  Moving slowly or fidgety/restless - - - - 1 1  Suicidal thoughts - - - - 0 0  PHQ-9 Score - - - - 10 10    Review of Systems  Constitutional: Positive for diaphoresis.  HENT: Negative.   Eyes: Negative.   Respiratory: Negative.   Cardiovascular: Negative.   Gastrointestinal: Positive for abdominal pain and constipation.  Endocrine:       High blood sugar  Genitourinary: Negative.   Musculoskeletal: Positive for arthralgias and back pain.  Skin: Negative.   Allergic/Immunologic: Negative.   Neurological: Negative.   Hematological: Negative.   Psychiatric/Behavioral: Negative.   All other systems reviewed and are negative.      Objective:   Physical Exam  Constitutional: She is oriented to person, place, and time. She appears well-developed and well-nourished.  HENT:  Head: Normocephalic and atraumatic.  Neck: Normal range of motion. Neck supple.  Cardiovascular: Normal rate and regular rhythm.   Pulmonary/Chest: Effort normal and breath sounds normal.  Musculoskeletal:  Normal Muscle Bulk and Muscle Testing Reveals: Upper Extremities: Full ROM and Muscle Strength 5/5 Lumbar Paraspinal Tenderness: L-3-L-5 Lower Extremities: Full ROM and Muscle Strength 5/5 Left Lower Extremity Flexion Produces Pain into Left Patella  Arises from Table slowly using straight cane for support Narrow Based Gait   Neurological: She is alert and oriented to person, place, and time.  Skin: Skin is warm and dry.  Psychiatric: She has a normal mood and affect.  Nursing note and vitals reviewed.         Assessment & Plan:  1.Spinal stenosis chronic low back pain radiating into the legs:09/17/2016:  Refilled: MSIR 15 mg take one tablet by mouththree times daily #90. We will continue the opioid monitoring program, this consists of regular clinic visits, examinations, urine drug screen, pill counts as well as use of [place] Controlled Substance Reporting System. 2. Diabetic Neuropathy: Continue Gabapentin.09/17/2016  3. Left Knee Pain: S/P Peripheral Nerve Block received 6-8 hours of  relief. Continue Current Medication and exercise Regimen. [name] refuses Cryoablation. ( RSD) Continue Lidoderm Patches.-09/17/2016 4. Left Hip Fracture S/P hemiarthroplasty: Orthopedist Following. 09/17/2016  20 minutes of face to face patient care time was spent during this visit. All questions were encouraged and answered.   F/U in 1 month

## 2016-10-01 ENCOUNTER — Ambulatory Visit: Payer: Self-pay | Admitting: Neurology

## 2016-10-01 DIAGNOSIS — Z029 Encounter for administrative examinations, unspecified: Secondary | ICD-10-CM

## 2016-10-02 ENCOUNTER — Telehealth: Payer: Self-pay | Admitting: Physical Medicine & Rehabilitation

## 2016-10-02 NOTE — Telephone Encounter (Signed)
Case worker ms Yates phoned office states she has requested 3x's office notes from 09/17/16 - printed after visit summary and faxed - jason?  Any issues with her fax number

## 2016-10-10 ENCOUNTER — Other Ambulatory Visit: Payer: Self-pay | Admitting: Neurology

## 2016-10-10 NOTE — Telephone Encounter (Signed)
Rx sent 

## 2016-10-13 ENCOUNTER — Other Ambulatory Visit: Payer: Self-pay | Admitting: Neurology

## 2016-10-15 ENCOUNTER — Other Ambulatory Visit: Payer: Self-pay | Admitting: *Deleted

## 2016-10-15 ENCOUNTER — Telehealth: Payer: Self-pay

## 2016-10-15 ENCOUNTER — Encounter: Payer: Self-pay | Admitting: Registered Nurse

## 2016-10-15 ENCOUNTER — Encounter: Payer: Worker's Compensation | Attending: Registered Nurse | Admitting: Registered Nurse

## 2016-10-15 VITALS — BP 108/64 | HR 60

## 2016-10-15 DIAGNOSIS — K219 Gastro-esophageal reflux disease without esophagitis: Secondary | ICD-10-CM | POA: Diagnosis not present

## 2016-10-15 DIAGNOSIS — N289 Disorder of kidney and ureter, unspecified: Secondary | ICD-10-CM | POA: Insufficient documentation

## 2016-10-15 DIAGNOSIS — S72002A Fracture of unspecified part of neck of left femur, initial encounter for closed fracture: Secondary | ICD-10-CM | POA: Insufficient documentation

## 2016-10-15 DIAGNOSIS — G8929 Other chronic pain: Secondary | ICD-10-CM | POA: Insufficient documentation

## 2016-10-15 DIAGNOSIS — G894 Chronic pain syndrome: Secondary | ICD-10-CM

## 2016-10-15 DIAGNOSIS — M25562 Pain in left knee: Secondary | ICD-10-CM | POA: Insufficient documentation

## 2016-10-15 DIAGNOSIS — Z760 Encounter for issue of repeat prescription: Secondary | ICD-10-CM | POA: Insufficient documentation

## 2016-10-15 DIAGNOSIS — M7061 Trochanteric bursitis, right hip: Secondary | ICD-10-CM | POA: Diagnosis not present

## 2016-10-15 DIAGNOSIS — G4730 Sleep apnea, unspecified: Secondary | ICD-10-CM | POA: Diagnosis not present

## 2016-10-15 DIAGNOSIS — M48061 Spinal stenosis, lumbar region without neurogenic claudication: Secondary | ICD-10-CM | POA: Diagnosis not present

## 2016-10-15 DIAGNOSIS — M545 Low back pain: Secondary | ICD-10-CM | POA: Insufficient documentation

## 2016-10-15 DIAGNOSIS — I10 Essential (primary) hypertension: Secondary | ICD-10-CM | POA: Diagnosis not present

## 2016-10-15 DIAGNOSIS — E1140 Type 2 diabetes mellitus with diabetic neuropathy, unspecified: Secondary | ICD-10-CM | POA: Insufficient documentation

## 2016-10-15 DIAGNOSIS — R0602 Shortness of breath: Secondary | ICD-10-CM | POA: Insufficient documentation

## 2016-10-15 DIAGNOSIS — Z79899 Other long term (current) drug therapy: Secondary | ICD-10-CM | POA: Diagnosis not present

## 2016-10-15 DIAGNOSIS — Z5181 Encounter for therapeutic drug level monitoring: Secondary | ICD-10-CM

## 2016-10-15 DIAGNOSIS — E079 Disorder of thyroid, unspecified: Secondary | ICD-10-CM | POA: Insufficient documentation

## 2016-10-15 DIAGNOSIS — M7062 Trochanteric bursitis, left hip: Secondary | ICD-10-CM | POA: Diagnosis not present

## 2016-10-15 MED ORDER — MORPHINE SULFATE 15 MG PO TABS: 15.0000 mg | ORAL_TABLET | Freq: Three times a day (TID) | ORAL | 0 refills | Status: DC

## 2016-10-15 MED ORDER — DONEPEZIL HCL 10 MG PO TABS: 10.0000 mg | ORAL_TABLET | Freq: Every day | ORAL | 5 refills | Status: DC

## 2016-10-15 NOTE — Telephone Encounter (Signed)
Left voicemail message to have patient return our call. regarding today's visit

## 2016-10-15 NOTE — Telephone Encounter (Signed)
Rx sent 

## 2016-10-15 NOTE — Progress Notes (Signed)
Subjective:    Patient ID: Paige Arnold, female    DOB: [birth year], 76 y.o.   MRN: [id number]  HPI: [name] is a 76year old female who returns for follow up appointmentfor chronic pain and medication refill. She states her pain is located in her lower back andleft knee.She rates her pain 4. Her current exercise regime is walking short distances with cane or walker and performing light house work.   Pain Inventory Average Pain 5 Pain Right Now 4 My pain is sharp, burning, dull, stabbing, tingling and aching  In the last 24 hours, has pain interfered with the following? General activity 2 Relation with others 2 Enjoyment of life 5 What TIME of day is your pain at its worst? . Sleep (in general) Fair  Pain is worse with: walking, bending and standing Pain improves with: rest, heat/ice and medication Relief from Meds: 4  Mobility use a cane ability to climb steps?  yes do you drive?  no  Function retired I need assistance with the following:  meal prep, household duties and shopping  Neuro/Psych No problems in this area  Prior Studies Any changes since last visit?  no  Physicians involved in your care Any changes since last visit?  no   Family History  Problem Relation Age of Onset  . Kidney disease Mother   . Heart disease Father   . Diabetes Brother   . Alcohol abuse Brother   . Depression Brother   . Sarcoidosis Brother   . ALS Brother     Deceased, 43s  . Heart disease Brother   . Bipolar disorder Daughter    Social History   Social History  . Marital status: Married    Spouse name: N/A  . Number of children: 3  . Years of education: N/A   Occupational History  . retired    Social History Main Topics  . Smoking status: Never Smoker  . Smokeless tobacco: Never Used  . Alcohol use No  . Drug use: No  . Sexual activity: Yes   Other Topics Concern  . Not on file   Social History Narrative   She lives with husband and daughter.   Three grown children.   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     She took early retired at 50 because of problems of RSD.   Highest level of education:  Graduated high school      Past Surgical History:  Procedure Laterality Date  . AV FISTULA PLACEMENT Left 02/16/2015   Procedure: LEFT ARM BRACHIOCEPHALIC (AV) FISTULA CREATION;  Surgeon: Mal Misty, MD;  Location: [facility];  Service: Vascular;  Laterality: Left;  . CHOLECYSTECTOMY    . INTRAOCULAR LENS INSERTION Right 10-21-13  . LIGATION OF COMPETING BRANCHES OF ARTERIOVENOUS FISTULA Left 02/16/2015   Procedure: LIGATION OF COMPETING BRANCHES OF LEFT BRACHIOCEPHALIC ARTERIOVENOUS FISTULA;  Surgeon: Mal Misty, MD;  Location: [facility];  Service: Vascular;  Laterality: Left;  . PARTIAL HIP ARTHROPLASTY    . PARTIAL KNEE ARTHROPLASTY     left  . TEMPOROMANDIBULAR JOINT SURGERY    . TONSILLECTOMY    . TUBAL LIGATION     Past Medical History:  Diagnosis Date  . Arthritis   . Diabetes mellitus   . Diabetic neuropathy ([place])   . GERD (gastroesophageal reflux disease)   . Hypertension   . Kidney disease   . Pneumonia Oct. 2016  . Shortness of breath dyspnea  when I first lay down, then it gets better  . Sleep apnea    does not wear CPAP   . Thyroid disorder    There were no vitals taken for this visit.  Opioid Risk Score:   Fall Risk Score:  `1  Depression screen PHQ 2/9  Depression screen [facility] 2/9 02/06/2016 12/07/2015 10/03/2015 09/05/2015 02/03/2015 10/01/2014  Decreased Interest 0 0 3 0 1 1  Down, Depressed, Hopeless 0 0 3 0 2 2  PHQ - 2 Score 0 0 6 0 3 3  Altered sleeping - - - - 2 2  Tired, decreased energy - - - - 2 2  Change in appetite - - - - 1 1  Feeling bad or failure about yourself  - - - - 0 0  Trouble concentrating - - - - 1 1  Moving slowly or fidgety/restless - - - - 1 1  Suicidal thoughts - - - - 0 0  PHQ-9 Score - - - - 10 10    Review of Systems  Constitutional: Positive for  unexpected weight change.  HENT: Negative.   Eyes: Negative.   Respiratory: Negative.   Cardiovascular: Negative.   Gastrointestinal: Negative.   Genitourinary: Negative.   Musculoskeletal: Negative.   Skin: Negative.   Allergic/Immunologic: Negative.   Neurological: Negative.   Hematological: Negative.   Psychiatric/Behavioral: Negative.   All other systems reviewed and are negative.      Objective:   Physical Exam  Constitutional: She is oriented to person, place, and time. She appears well-developed and well-nourished.  HENT:  Head: Normocephalic and atraumatic.  Neck: Normal range of motion. Neck supple.  Cardiovascular: Normal rate and regular rhythm.   Pulmonary/Chest: Effort normal and breath sounds normal.  Neurological: She is alert and oriented to person, place, and time.  Skin: Skin is warm and dry.  Psychiatric: She has a normal mood and affect.  Nursing note and vitals reviewed.         Assessment & Plan:  1.Spinal stenosis chronic low back pain radiating into the legs:04/02//2018:  Refilled:MSIR 15 mg take one tablet by mouththree times daily #90. We will continue the opioid monitoring program, this consists of regular clinic visits, examinations, urine drug screen, pill counts as well as use of [place] Controlled Substance Reporting System. 2. Diabetic Neuropathy: Continue Gabapentin.10/15/2016 3. Left Knee Pain: S/P Peripheral Nerve Block received 6-8 hours of relief.Continue Current Medication and exercise Regimen. [name] refuses Cryoablation. ( RSD) Continue Lidoderm Patches.10/15/2016 4. Left Hip Fracture S/P hemiarthroplasty: Orthopedist Following. 10/15/2016 5. Bilateral Greater Trochanteric Bursitis: Continue  Heat and Ice Therapy  20 minutes of face to face patient care time was spent during this visit. All questions were encouraged and answered.

## 2016-10-15 NOTE — Telephone Encounter (Signed)
spoken with patient and notified patient  She must present a UDS within 24 hours, which is stated in medication contract. Patient states she will be in tomorrow morning to re-take her UDS.

## 2016-10-17 NOTE — Telephone Encounter (Signed)
Patient submitted urine specimen 10/16/2016 per Zorita Pang, Clinical Operations Manager

## 2016-10-20 LAB — 6-ACETYLMORPHINE,TOXASSURE ADD
6-ACETYLMORPHINE: NEGATIVE
6-acetylmorphine: NOT DETECTED ng/mg creat

## 2016-10-20 LAB — TOXASSURE SELECT,+ANTIDEPR,UR

## 2016-10-29 ENCOUNTER — Telehealth: Payer: Self-pay | Admitting: Registered Nurse

## 2016-10-29 NOTE — Telephone Encounter (Signed)
Ms. Zeta Bucy had a UDS performed on 10/16/2016, it was consistent.

## 2016-11-13 ENCOUNTER — Telehealth: Payer: Self-pay | Admitting: Registered Nurse

## 2016-11-13 ENCOUNTER — Encounter: Payer: Worker's Compensation | Attending: Registered Nurse | Admitting: Registered Nurse

## 2016-11-13 ENCOUNTER — Encounter: Payer: Self-pay | Admitting: Registered Nurse

## 2016-11-13 VITALS — BP 128/71 | HR 60

## 2016-11-13 DIAGNOSIS — E079 Disorder of thyroid, unspecified: Secondary | ICD-10-CM | POA: Diagnosis not present

## 2016-11-13 DIAGNOSIS — M542 Cervicalgia: Secondary | ICD-10-CM | POA: Diagnosis not present

## 2016-11-13 DIAGNOSIS — Z760 Encounter for issue of repeat prescription: Secondary | ICD-10-CM | POA: Diagnosis present

## 2016-11-13 DIAGNOSIS — X58XXXA Exposure to other specified factors, initial encounter: Secondary | ICD-10-CM | POA: Diagnosis not present

## 2016-11-13 DIAGNOSIS — K219 Gastro-esophageal reflux disease without esophagitis: Secondary | ICD-10-CM | POA: Insufficient documentation

## 2016-11-13 DIAGNOSIS — Z5181 Encounter for therapeutic drug level monitoring: Secondary | ICD-10-CM

## 2016-11-13 DIAGNOSIS — M47812 Spondylosis without myelopathy or radiculopathy, cervical region: Secondary | ICD-10-CM

## 2016-11-13 DIAGNOSIS — M48061 Spinal stenosis, lumbar region without neurogenic claudication: Secondary | ICD-10-CM

## 2016-11-13 DIAGNOSIS — E1140 Type 2 diabetes mellitus with diabetic neuropathy, unspecified: Secondary | ICD-10-CM | POA: Diagnosis not present

## 2016-11-13 DIAGNOSIS — M7062 Trochanteric bursitis, left hip: Secondary | ICD-10-CM | POA: Insufficient documentation

## 2016-11-13 DIAGNOSIS — G8929 Other chronic pain: Secondary | ICD-10-CM | POA: Diagnosis not present

## 2016-11-13 DIAGNOSIS — Z8249 Family history of ischemic heart disease and other diseases of the circulatory system: Secondary | ICD-10-CM | POA: Diagnosis not present

## 2016-11-13 DIAGNOSIS — S72002A Fracture of unspecified part of neck of left femur, initial encounter for closed fracture: Secondary | ICD-10-CM | POA: Diagnosis not present

## 2016-11-13 DIAGNOSIS — I10 Essential (primary) hypertension: Secondary | ICD-10-CM | POA: Insufficient documentation

## 2016-11-13 DIAGNOSIS — Z79899 Other long term (current) drug therapy: Secondary | ICD-10-CM | POA: Diagnosis not present

## 2016-11-13 DIAGNOSIS — M25562 Pain in left knee: Secondary | ICD-10-CM | POA: Diagnosis not present

## 2016-11-13 DIAGNOSIS — Z9049 Acquired absence of other specified parts of digestive tract: Secondary | ICD-10-CM | POA: Insufficient documentation

## 2016-11-13 DIAGNOSIS — G894 Chronic pain syndrome: Secondary | ICD-10-CM | POA: Diagnosis not present

## 2016-11-13 DIAGNOSIS — Z833 Family history of diabetes mellitus: Secondary | ICD-10-CM | POA: Diagnosis not present

## 2016-11-13 DIAGNOSIS — M7061 Trochanteric bursitis, right hip: Secondary | ICD-10-CM | POA: Diagnosis not present

## 2016-11-13 DIAGNOSIS — G4730 Sleep apnea, unspecified: Secondary | ICD-10-CM | POA: Insufficient documentation

## 2016-11-13 MED ORDER — MORPHINE SULFATE 15 MG PO TABS: 15.0000 mg | ORAL_TABLET | Freq: Three times a day (TID) | ORAL | 0 refills | Status: DC

## 2016-11-13 MED ORDER — LIDOCAINE 5 % EX PTCH: 1.0000 | MEDICATED_PATCH | Freq: Two times a day (BID) | CUTANEOUS | 4 refills | Status: DC

## 2016-11-13 NOTE — Progress Notes (Signed)
Subjective:    Patient ID: Paige Arnold, female    DOB: 1940-08-31, 76 y.o.   MRN: [id number]  HPI: Paige Arnold is a 76year old female who returns for follow up appointmentfor chronic pain and medication refill. She states her pain is located in her upper -lower back andleft knee.She rates her pain 4. Her current exercise regime is walking short distances with cane or walker and performing light house work.   Her last UDS was on 10/16/2016, it was consistent.   Pain Inventory Average Pain 4 Pain Right Now 4 My pain is na  In the last 24 hours, has pain interfered with the following? General activity 2 Relation with others 2 Enjoyment of life 4 What TIME of day is your pain at its worst? na Sleep (in general) NA  Pain is worse with: walking, bending, standing and some activites Pain improves with: rest, heat/ice and medication Relief from Meds: 5  Mobility use a cane Do you have any goals in this area?  no  Function disabled: date disabled . retired I need assistance with the following:  meal prep, household duties and shopping  Neuro/Psych No problems in this area  Prior Studies Any changes since last visit?  no  Physicians involved in your care Any changes since last visit?  no   Family History  Problem Relation Age of Onset  . Kidney disease Mother   . Heart disease Father   . Diabetes Brother   . Alcohol abuse Brother   . Depression Brother   . Sarcoidosis Brother   . ALS Brother     Deceased, 11s  . Heart disease Brother   . Bipolar disorder Daughter    Social History   Social History  . Marital status: Married    Spouse name: N/A  . Number of children: 3  . Years of education: N/A   Occupational History  . retired    Social History Main Topics  . Smoking status: Never Smoker  . Smokeless tobacco: Never Used  . Alcohol use No  . Drug use: No  . Sexual activity: Yes   Other Topics Concern  . None   Social History Narrative   She lives with husband and daughter.  Three grown children.   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     She took early retired at 47 because of problems of RSD.   Highest level of education:  Graduated high school      Past Surgical History:  Procedure Laterality Date  . AV FISTULA PLACEMENT Left 02/16/2015   Procedure: LEFT ARM BRACHIOCEPHALIC (AV) FISTULA CREATION;  Surgeon: Mal Misty, MD;  Location: [facility];  Service: Vascular;  Laterality: Left;  . CHOLECYSTECTOMY    . INTRAOCULAR LENS INSERTION Right 10-21-13  . LIGATION OF COMPETING BRANCHES OF ARTERIOVENOUS FISTULA Left 02/16/2015   Procedure: LIGATION OF COMPETING BRANCHES OF LEFT BRACHIOCEPHALIC ARTERIOVENOUS FISTULA;  Surgeon: Mal Misty, MD;  Location: [facility];  Service: Vascular;  Laterality: Left;  . PARTIAL HIP ARTHROPLASTY    . PARTIAL KNEE ARTHROPLASTY     left  . TEMPOROMANDIBULAR JOINT SURGERY    . TONSILLECTOMY    . TUBAL LIGATION     Past Medical History:  Diagnosis Date  . Arthritis   . Diabetes mellitus   . Diabetic neuropathy ([place])   . GERD (gastroesophageal reflux disease)   . Hypertension   . Kidney disease   . Pneumonia Oct.  2016  . Shortness of breath dyspnea    when I first lay down, then it gets better  . Sleep apnea    does not wear CPAP   . Thyroid disorder    BP 128/71   Pulse (!) 56   SpO2 93%   Opioid Risk Score:   Fall Risk Score:  `1  Depression screen PHQ 2/9  Depression screen [facility] 2/9 11/13/2016 02/06/2016 12/07/2015 10/03/2015 09/05/2015 02/03/2015 10/01/2014  Decreased Interest 0 0 0 3 0 1 1  Down, Depressed, Hopeless 0 0 0 3 0 2 2  PHQ - 2 Score 0 0 0 6 0 3 3  Altered sleeping - - - - - 2 2  Tired, decreased energy - - - - - 2 2  Change in appetite - - - - - 1 1  Feeling bad or failure about yourself  - - - - - 0 0  Trouble concentrating - - - - - 1 1  Moving slowly or fidgety/restless - - - - - 1 1  Suicidal thoughts - - - - - 0 0  PHQ-9 Score - - - - -  10 10   Review of Systems  Constitutional: Positive for unexpected weight change.  HENT: Negative.   Eyes: Negative.   Respiratory: Negative.   Cardiovascular: Negative.   Gastrointestinal: Positive for constipation.  Endocrine:       High blood sugars  Genitourinary: Negative.   Musculoskeletal: Negative.   Skin: Negative.   Allergic/Immunologic: Negative.   Neurological: Negative.   Hematological: Negative.   Psychiatric/Behavioral: Negative.   All other systems reviewed and are negative.      Objective:   Physical Exam  Constitutional: She is oriented to person, place, and time. She appears well-developed and well-nourished.  HENT:  Head: Normocephalic and atraumatic.  Neck: Normal range of motion. Neck supple.  Cardiovascular: Normal rate and regular rhythm.   Pulmonary/Chest: Effort normal and breath sounds normal.  Musculoskeletal:  Normal Muscle Bulk and Muscle Testing Reveals: Upper Extremities: Full ROM and Muscle Strength 5/5 Thoracic Paraspinal Tenderness: T-1-T-3 Lumbar Paraspinal Tenderness: L-4-L-5 Lower Extremities: Full ROM and Muscle Strength 5/5 Arises from Table with ease Narrow Based Gait  Neurological: She is alert and oriented to person, place, and time.  Skin: Skin is warm and dry.  Psychiatric: She has a normal mood and affect.  Nursing note and vitals reviewed.         Assessment & Plan:  1.Spinal stenosis chronic low back pain radiating into the legs:05/01//2018:  Refilled:MSIR 15 mg take one tablet by mouththree times daily #90. We will continue the opioid monitoring program, this consists of regular clinic visits, examinations, urine drug screen, pill counts as well as use of [place] Controlled Substance Reporting System. 2. Diabetic Neuropathy: Continue Gabapentin.11/13/2016 3. Left Knee Pain: S/P Peripheral Nerve Block received 6-8 hours of relief.Continue Current Medication and exercise Regimen. Ms. [place] refuses  Cryoablation. ( RSD) Continue Lidoderm Patches.11/13/2016 4. Left Hip Fracture S/P hemiarthroplasty: Orthopedist Following. 11/13/2016 5. Bilateral Greater Trochanteric Bursitis:: No complaints today. Continue  Heat and Ice Therapy  20  minutes of face to face patient care time was spent during this visit. All questions were encouraged and answered.  F/U in 1 month

## 2016-11-13 NOTE — Telephone Encounter (Signed)
On 11/13/2016 the [place] was reviewed no conflict was seen on the [place] with multiple prescribers. Paige Arnold has a signed narcotic contract with our office. If there were any discrepancies this would have been reported to her physician.

## 2016-11-14 ENCOUNTER — Encounter: Payer: Medicare Other | Admitting: Registered Nurse

## 2016-12-14 ENCOUNTER — Encounter: Payer: Worker's Compensation | Attending: Registered Nurse | Admitting: Registered Nurse

## 2016-12-14 VITALS — BP 126/74 | HR 68

## 2016-12-14 DIAGNOSIS — Z841 Family history of disorders of kidney and ureter: Secondary | ICD-10-CM | POA: Diagnosis not present

## 2016-12-14 DIAGNOSIS — Z5181 Encounter for therapeutic drug level monitoring: Secondary | ICD-10-CM

## 2016-12-14 DIAGNOSIS — I10 Essential (primary) hypertension: Secondary | ICD-10-CM | POA: Insufficient documentation

## 2016-12-14 DIAGNOSIS — E079 Disorder of thyroid, unspecified: Secondary | ICD-10-CM | POA: Diagnosis not present

## 2016-12-14 DIAGNOSIS — E1149 Type 2 diabetes mellitus with other diabetic neurological complication: Secondary | ICD-10-CM | POA: Diagnosis not present

## 2016-12-14 DIAGNOSIS — Z811 Family history of alcohol abuse and dependence: Secondary | ICD-10-CM | POA: Diagnosis not present

## 2016-12-14 DIAGNOSIS — Z9889 Other specified postprocedural states: Secondary | ICD-10-CM | POA: Insufficient documentation

## 2016-12-14 DIAGNOSIS — X58XXXD Exposure to other specified factors, subsequent encounter: Secondary | ICD-10-CM | POA: Insufficient documentation

## 2016-12-14 DIAGNOSIS — Z8249 Family history of ischemic heart disease and other diseases of the circulatory system: Secondary | ICD-10-CM | POA: Insufficient documentation

## 2016-12-14 DIAGNOSIS — Z818 Family history of other mental and behavioral disorders: Secondary | ICD-10-CM | POA: Diagnosis not present

## 2016-12-14 DIAGNOSIS — S72002S Fracture of unspecified part of neck of left femur, sequela: Secondary | ICD-10-CM | POA: Insufficient documentation

## 2016-12-14 DIAGNOSIS — Z760 Encounter for issue of repeat prescription: Secondary | ICD-10-CM | POA: Diagnosis not present

## 2016-12-14 DIAGNOSIS — M7062 Trochanteric bursitis, left hip: Secondary | ICD-10-CM | POA: Diagnosis not present

## 2016-12-14 DIAGNOSIS — G4730 Sleep apnea, unspecified: Secondary | ICD-10-CM | POA: Insufficient documentation

## 2016-12-14 DIAGNOSIS — M48061 Spinal stenosis, lumbar region without neurogenic claudication: Secondary | ICD-10-CM | POA: Diagnosis not present

## 2016-12-14 DIAGNOSIS — E1140 Type 2 diabetes mellitus with diabetic neuropathy, unspecified: Secondary | ICD-10-CM | POA: Diagnosis not present

## 2016-12-14 DIAGNOSIS — G894 Chronic pain syndrome: Secondary | ICD-10-CM

## 2016-12-14 DIAGNOSIS — Z9049 Acquired absence of other specified parts of digestive tract: Secondary | ICD-10-CM | POA: Insufficient documentation

## 2016-12-14 DIAGNOSIS — M25562 Pain in left knee: Secondary | ICD-10-CM

## 2016-12-14 DIAGNOSIS — K219 Gastro-esophageal reflux disease without esophagitis: Secondary | ICD-10-CM | POA: Diagnosis not present

## 2016-12-14 DIAGNOSIS — Z833 Family history of diabetes mellitus: Secondary | ICD-10-CM | POA: Diagnosis not present

## 2016-12-14 DIAGNOSIS — Z79899 Other long term (current) drug therapy: Secondary | ICD-10-CM

## 2016-12-14 DIAGNOSIS — G8929 Other chronic pain: Secondary | ICD-10-CM | POA: Diagnosis present

## 2016-12-14 DIAGNOSIS — S72002D Fracture of unspecified part of neck of left femur, subsequent encounter for closed fracture with routine healing: Secondary | ICD-10-CM | POA: Diagnosis not present

## 2016-12-14 MED ORDER — MORPHINE SULFATE 15 MG PO TABS: 15.0000 mg | ORAL_TABLET | Freq: Three times a day (TID) | ORAL | 0 refills | Status: DC

## 2016-12-14 NOTE — Progress Notes (Signed)
Subjective:    Patient ID: Paige Arnold, female    DOB: 03-06-1941, 76 y.o.   MRN: [id number]  HPI: [name] is a 76year old female who returns for follow up appointmentfor chronic pain and medication refill. She states her pain is located in her lower back, left hip, left leg andleft knee.She rates her pain 5.Her current exercise regime is walking short distances with cane or walker and performing light house work.   Her last UDS was on 10/16/2016, it was consistent.    Pain Inventory Average Pain 5 Pain Right Now 5 My pain is sharp, burning, dull, stabbing, tingling and aching  In the last 24 hours, has pain interfered with the following? General activity 2 Relation with others 2 Enjoyment of life 5 What TIME of day is your pain at its worst? all Sleep (in general) Fair  Pain is worse with: walking and bending Pain improves with: rest, heat/ice and medication Relief from Meds: 4  Mobility use a cane ability to climb steps?  yes do you drive?  yes  Function retired I need assistance with the following:  meal prep, household duties and shopping  Neuro/Psych No problems in this area  Prior Studies Any changes since last visit?  no  Physicians involved in your care Any changes since last visit?  no   Family History  Problem Relation Age of Onset  . Kidney disease Mother   . Heart disease Father   . Diabetes Brother   . Alcohol abuse Brother   . Depression Brother   . Sarcoidosis Brother   . ALS Brother        Deceased, 42s  . Heart disease Brother   . Bipolar disorder Daughter    Social History   Social History  . Marital status: Married    Spouse name: N/A  . Number of children: 3  . Years of education: N/A   Occupational History  . retired    Social History Main Topics  . Smoking status: Never Smoker  . Smokeless tobacco: Never Used  . Alcohol use No  . Drug use: No  . Sexual activity: Yes   Other Topics Concern  . Not on  file   Social History Narrative   She lives with husband and daughter.  Three grown children.   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     She took early retired at 8 because of problems of RSD.   Highest level of education:  Graduated high school      Past Surgical History:  Procedure Laterality Date  . AV FISTULA PLACEMENT Left 02/16/2015   Procedure: LEFT ARM BRACHIOCEPHALIC (AV) FISTULA CREATION;  Surgeon: Mal Misty, MD;  Location: [facility];  Service: Vascular;  Laterality: Left;  . CHOLECYSTECTOMY    . INTRAOCULAR LENS INSERTION Right 10-21-13  . LIGATION OF COMPETING BRANCHES OF ARTERIOVENOUS FISTULA Left 02/16/2015   Procedure: LIGATION OF COMPETING BRANCHES OF LEFT BRACHIOCEPHALIC ARTERIOVENOUS FISTULA;  Surgeon: Mal Misty, MD;  Location: [facility];  Service: Vascular;  Laterality: Left;  . PARTIAL HIP ARTHROPLASTY    . PARTIAL KNEE ARTHROPLASTY     left  . TEMPOROMANDIBULAR JOINT SURGERY    . TONSILLECTOMY    . TUBAL LIGATION     Past Medical History:  Diagnosis Date  . Arthritis   . Diabetes mellitus   . Diabetic neuropathy ([place])   . GERD (gastroesophageal reflux disease)   . Hypertension   .  Kidney disease   . Pneumonia Oct. 2016  . Shortness of breath dyspnea    when I first lay down, then it gets better  . Sleep apnea    does not wear CPAP   . Thyroid disorder    There were no vitals taken for this visit.  Opioid Risk Score:   Fall Risk Score:  `1  Depression screen PHQ 2/9  Depression screen [facility] 2/9 11/13/2016 02/06/2016 12/07/2015 10/03/2015 09/05/2015 02/03/2015 10/01/2014  Decreased Interest 0 0 0 3 0 1 1  Down, Depressed, Hopeless 0 0 0 3 0 2 2  PHQ - 2 Score 0 0 0 6 0 3 3  Altered sleeping - - - - - 2 2  Tired, decreased energy - - - - - 2 2  Change in appetite - - - - - 1 1  Feeling bad or failure about yourself  - - - - - 0 0  Trouble concentrating - - - - - 1 1  Moving slowly or fidgety/restless - - - - - 1 1  Suicidal  thoughts - - - - - 0 0  PHQ-9 Score - - - - - 10 10   ` Review of Systems  Constitutional: Positive for unexpected weight change.  HENT: Negative.   Eyes: Negative.   Respiratory: Negative.   Cardiovascular: Negative.   Gastrointestinal: Positive for constipation.  Endocrine: Negative.   Genitourinary: Positive for difficulty urinating.  Musculoskeletal: Negative.   Skin: Negative.   Allergic/Immunologic: Negative.   Neurological: Negative.   Hematological: Negative.   Psychiatric/Behavioral: Negative.   All other systems reviewed and are negative.      Objective:   Physical Exam  Constitutional: She is oriented to person, place, and time. She appears well-developed and well-nourished.  HENT:  Head: Normocephalic and atraumatic.  Neck: Normal range of motion. Neck supple.  Cardiovascular: Normal rate and regular rhythm.   Pulmonary/Chest: Effort normal and breath sounds normal.  Musculoskeletal:  Normal Muscle Bulk and Muscle Testing Reveals: Upper Extremities: Full ROM and Muscle Strength 5/5 Thoracic and Lumbar Hypersensitivity Lower Extremities: Full ROM and Muscle Strength 5/5 Left Lower Extremity Flexion Produces Pain into Patella Arises from Table Slowly using cane for support Narrow Based Gait  Neurological: She is alert and oriented to person, place, and time.  Skin: Skin is warm and dry.  Psychiatric: She has a normal mood and affect.  Nursing note and vitals reviewed.         Assessment & Plan:  1.Spinal stenosis chronic low back pain radiating into the legs:06/01//2018:  Refilled:MSIR 15 mg take one tablet by mouththree times daily #90. We will continue the opioid monitoring program, this consists of regular clinic visits, examinations, urine drug screen, pill counts as well as use of [place] Controlled Substance Reporting System. 2. Diabetic Neuropathy: Continue Gabapentin.12/14/2016 3. Left Knee Pain: S/P Peripheral Nerve Block received  6-8 hours of relief.Continue Current Medication and exercise Regimen. Paige Arnold refuses Cryoablation. ( RSD) Continue Lidoderm Patches.12/14/2016 4. Left Hip Fracture S/P hemiarthroplasty: Orthopedist Following. 12/14/2016 5. Left Greater Trochanteric Bursitis: Continue Heat and Ice Therapy  20 minutes of face to face patient care time was spent during this visit. All questions were encouraged and answered.   F/U in 1 month

## 2016-12-17 ENCOUNTER — Encounter: Payer: Self-pay | Admitting: Registered Nurse

## 2017-01-21 ENCOUNTER — Telehealth: Payer: Self-pay | Admitting: *Deleted

## 2017-01-21 DIAGNOSIS — Z79899 Other long term (current) drug therapy: Secondary | ICD-10-CM

## 2017-01-21 DIAGNOSIS — Z5181 Encounter for therapeutic drug level monitoring: Secondary | ICD-10-CM

## 2017-01-21 DIAGNOSIS — G894 Chronic pain syndrome: Secondary | ICD-10-CM

## 2017-01-21 MED ORDER — MORPHINE SULFATE 15 MG PO TABS: 15.0000 mg | ORAL_TABLET | Freq: Three times a day (TID) | ORAL | 0 refills | Status: DC

## 2017-01-21 NOTE — Telephone Encounter (Signed)
[facility] called and is going to be out of her medication before her 01/24/17 (last filled 12/20/16). Rx printed for Dr Naaman Plummer to sign.  She will keep her 01/24/17 appt with Zella Ball.

## 2017-01-24 ENCOUNTER — Encounter: Payer: Worker's Compensation | Attending: Registered Nurse | Admitting: Registered Nurse

## 2017-01-24 ENCOUNTER — Encounter: Payer: Self-pay | Admitting: Registered Nurse

## 2017-01-24 VITALS — BP 125/72 | HR 60

## 2017-01-24 DIAGNOSIS — M7062 Trochanteric bursitis, left hip: Secondary | ICD-10-CM | POA: Diagnosis not present

## 2017-01-24 DIAGNOSIS — Z5181 Encounter for therapeutic drug level monitoring: Secondary | ICD-10-CM

## 2017-01-24 DIAGNOSIS — M545 Low back pain: Secondary | ICD-10-CM | POA: Insufficient documentation

## 2017-01-24 DIAGNOSIS — G4730 Sleep apnea, unspecified: Secondary | ICD-10-CM | POA: Insufficient documentation

## 2017-01-24 DIAGNOSIS — G90522 Complex regional pain syndrome I of left lower limb: Secondary | ICD-10-CM

## 2017-01-24 DIAGNOSIS — K219 Gastro-esophageal reflux disease without esophagitis: Secondary | ICD-10-CM | POA: Insufficient documentation

## 2017-01-24 DIAGNOSIS — Z9889 Other specified postprocedural states: Secondary | ICD-10-CM | POA: Insufficient documentation

## 2017-01-24 DIAGNOSIS — I10 Essential (primary) hypertension: Secondary | ICD-10-CM | POA: Diagnosis not present

## 2017-01-24 DIAGNOSIS — Z79899 Other long term (current) drug therapy: Secondary | ICD-10-CM | POA: Diagnosis not present

## 2017-01-24 DIAGNOSIS — Z760 Encounter for issue of repeat prescription: Secondary | ICD-10-CM | POA: Insufficient documentation

## 2017-01-24 DIAGNOSIS — G894 Chronic pain syndrome: Secondary | ICD-10-CM | POA: Diagnosis not present

## 2017-01-24 DIAGNOSIS — M25562 Pain in left knee: Secondary | ICD-10-CM | POA: Insufficient documentation

## 2017-01-24 DIAGNOSIS — E079 Disorder of thyroid, unspecified: Secondary | ICD-10-CM | POA: Insufficient documentation

## 2017-01-24 DIAGNOSIS — M48061 Spinal stenosis, lumbar region without neurogenic claudication: Secondary | ICD-10-CM | POA: Diagnosis not present

## 2017-01-24 DIAGNOSIS — E1140 Type 2 diabetes mellitus with diabetic neuropathy, unspecified: Secondary | ICD-10-CM | POA: Insufficient documentation

## 2017-01-24 DIAGNOSIS — E1149 Type 2 diabetes mellitus with other diabetic neurological complication: Secondary | ICD-10-CM

## 2017-01-24 DIAGNOSIS — G8929 Other chronic pain: Secondary | ICD-10-CM | POA: Diagnosis not present

## 2017-01-24 MED ORDER — MORPHINE SULFATE 15 MG PO TABS: 15.0000 mg | ORAL_TABLET | Freq: Three times a day (TID) | ORAL | 0 refills | Status: DC

## 2017-01-24 NOTE — Progress Notes (Signed)
Subjective:    Patient ID: Paige Arnold, female    DOB: 1941-04-28, 76 y.o.   MRN: [id number]  HPI:  Paige Arnold is a 76year old female who returns for follow up appointmentfor chronic pain and medication refill. She states her pain is located in her lower back andleft knee.She rates her pain 4.Her current exercise regime is walking short distances with cane or walker and performing light house work.   Her last UDS was on 10/16/2016, it was consistent.    Pain Inventory Average Pain 5 Pain Right Now 4 My pain is sharp, burning, stabbing, tingling and aching  In the last 24 hours, has pain interfered with the following? General activity 2 Relation with others 2 Enjoyment of life 5 What TIME of day is your pain at its worst? all Sleep (in general) Fair  Pain is worse with: walking, bending, standing and some activites Pain improves with: rest, heat/ice and medication Relief from Meds: 5  Mobility use a cane ability to climb steps?  yes do you drive?  no  Function disabled: date disabled . I need assistance with the following:  meal prep, household duties and shopping  Neuro/Psych No problems in this area  Prior Studies Any changes since last visit?  no  Physicians involved in your care Any changes since last visit?  no   Family History  Problem Relation Age of Onset  . Kidney disease Mother   . Heart disease Father   . Diabetes Brother   . Alcohol abuse Brother   . Depression Brother   . Sarcoidosis Brother   . ALS Brother        Deceased, 85s  . Heart disease Brother   . Bipolar disorder Daughter    Social History   Social History  . Marital status: Married    Spouse name: N/A  . Number of children: 3  . Years of education: N/A   Occupational History  . retired    Social History Main Topics  . Smoking status: Never Smoker  . Smokeless tobacco: Never Used  . Alcohol use No  . Drug use: No  . Sexual activity: Yes   Other Topics  Concern  . None   Social History Narrative   She lives with husband and daughter.  Three grown children.   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     She took early retired at 40 because of problems of RSD.   Highest level of education:  Graduated high school      Past Surgical History:  Procedure Laterality Date  . AV FISTULA PLACEMENT Left 02/16/2015   Procedure: LEFT ARM BRACHIOCEPHALIC (AV) FISTULA CREATION;  Surgeon: Mal Misty, MD;  Location: [facility];  Service: Vascular;  Laterality: Left;  . CHOLECYSTECTOMY    . INTRAOCULAR LENS INSERTION Right 10-21-13  . LIGATION OF COMPETING BRANCHES OF ARTERIOVENOUS FISTULA Left 02/16/2015   Procedure: LIGATION OF COMPETING BRANCHES OF LEFT BRACHIOCEPHALIC ARTERIOVENOUS FISTULA;  Surgeon: Mal Misty, MD;  Location: [facility];  Service: Vascular;  Laterality: Left;  . PARTIAL HIP ARTHROPLASTY    . PARTIAL KNEE ARTHROPLASTY     left  . TEMPOROMANDIBULAR JOINT SURGERY    . TONSILLECTOMY    . TUBAL LIGATION     Past Medical History:  Diagnosis Date  . Arthritis   . Diabetes mellitus   . Diabetic neuropathy ([place])   . GERD (gastroesophageal reflux disease)   . Hypertension   .  Kidney disease   . Pneumonia Oct. 2016  . Shortness of breath dyspnea    when I first lay down, then it gets better  . Sleep apnea    does not wear CPAP   . Thyroid disorder    BP 125/72   Pulse 60 Comment: apical  SpO2 93%   Opioid Risk Score:   Fall Risk Score:  `1  Depression screen PHQ 2/9  Depression screen [facility] 2/9 11/13/2016 02/06/2016 12/07/2015 10/03/2015 09/05/2015 02/03/2015 10/01/2014  Decreased Interest 0 0 0 3 0 1 1  Down, Depressed, Hopeless 0 0 0 3 0 2 2  PHQ - 2 Score 0 0 0 6 0 3 3  Altered sleeping - - - - - 2 2  Tired, decreased energy - - - - - 2 2  Change in appetite - - - - - 1 1  Feeling bad or failure about yourself  - - - - - 0 0  Trouble concentrating - - - - - 1 1  Moving slowly or fidgety/restless - - - - - 1 1   Suicidal thoughts - - - - - 0 0  PHQ-9 Score - - - - - 10 10    Review of Systems  Constitutional: Positive for unexpected weight change.  HENT: Negative.   Eyes: Negative.   Respiratory: Negative.   Cardiovascular: Negative.   Gastrointestinal: Positive for constipation.  Endocrine: Negative.   Genitourinary: Negative.   Musculoskeletal: Negative.   Skin: Negative.   Allergic/Immunologic: Negative.   Neurological: Negative.   Hematological: Negative.   Psychiatric/Behavioral: Negative.   All other systems reviewed and are negative.      Objective:   Physical Exam  Constitutional: She is oriented to person, place, and time. She appears well-developed and well-nourished.  HENT:  Head: Normocephalic and atraumatic.  Neck: Normal range of motion. Neck supple.  Cardiovascular: Normal rate and regular rhythm.   Pulmonary/Chest: Effort normal and breath sounds normal.  Musculoskeletal:  Normal Muscle Bulk and Muscle Testing Reveals: Upper Extremities: Full ROM and Muscle Strength 5/5 Thoracic Paraspinal Tenderness: T-7-T-9 Lumbar Paraspinal Tenderness: L-3-L-5 Lower Extremities: Full ROM and Muscle Strength 5/5 Arises from Table Slowly using Straight Cane for Support Narrow Based Gait  Neurological: She is alert and oriented to person, place, and time.  Skin: Skin is warm and dry.  Psychiatric: She has a normal mood and affect.  Nursing note and vitals reviewed.         Assessment & Plan:  1.Spinal stenosis chronic low back pain radiating into the legs:07/12//2018:  Continue:MSIR 15 mg take one tablet by mouththree times daily #90, No script given. We will continue the opioid monitoring program, this consists of regular clinic visits, examinations, urine drug screen, pill counts as well as use of [place] Controlled Substance Reporting System. 2. Diabetic Neuropathy: Continue Gabapentin.01/24/2017 3. Left Knee Pain: S/P Peripheral Nerve Block received 6-8  hours of relief.Continue Current Medication and exercise Regimen. Ms. Maish refuses Cryoablation. ( RSD) Continue Lidoderm Patches.01/24/2017 4. Left Hip Fracture S/P hemiarthroplasty: Orthopedist Following. 01/24/2017 5. Left Greater Trochanteric Bursitis: Continue Heat and Ice Therapy. 01/24/2017  20 minutes of face to face patient care time was spent during this visit. All questions were encouraged and answered.   F/U in 1 month

## 2017-02-01 ENCOUNTER — Ambulatory Visit: Payer: Self-pay | Admitting: Neurology

## 2017-02-14 ENCOUNTER — Encounter: Payer: Self-pay | Admitting: Registered Nurse

## 2017-02-14 ENCOUNTER — Encounter: Payer: Worker's Compensation | Attending: Registered Nurse | Admitting: Registered Nurse

## 2017-02-14 VITALS — BP 124/70 | HR 64

## 2017-02-14 DIAGNOSIS — G4730 Sleep apnea, unspecified: Secondary | ICD-10-CM | POA: Diagnosis not present

## 2017-02-14 DIAGNOSIS — G894 Chronic pain syndrome: Secondary | ICD-10-CM | POA: Diagnosis not present

## 2017-02-14 DIAGNOSIS — Z5181 Encounter for therapeutic drug level monitoring: Secondary | ICD-10-CM

## 2017-02-14 DIAGNOSIS — E1140 Type 2 diabetes mellitus with diabetic neuropathy, unspecified: Secondary | ICD-10-CM | POA: Diagnosis not present

## 2017-02-14 DIAGNOSIS — M4800 Spinal stenosis, site unspecified: Secondary | ICD-10-CM | POA: Insufficient documentation

## 2017-02-14 DIAGNOSIS — E079 Disorder of thyroid, unspecified: Secondary | ICD-10-CM | POA: Diagnosis not present

## 2017-02-14 DIAGNOSIS — I10 Essential (primary) hypertension: Secondary | ICD-10-CM | POA: Insufficient documentation

## 2017-02-14 DIAGNOSIS — M48061 Spinal stenosis, lumbar region without neurogenic claudication: Secondary | ICD-10-CM

## 2017-02-14 DIAGNOSIS — Z79899 Other long term (current) drug therapy: Secondary | ICD-10-CM | POA: Diagnosis not present

## 2017-02-14 DIAGNOSIS — M25562 Pain in left knee: Secondary | ICD-10-CM

## 2017-02-14 DIAGNOSIS — E1149 Type 2 diabetes mellitus with other diabetic neurological complication: Secondary | ICD-10-CM

## 2017-02-14 DIAGNOSIS — Z9889 Other specified postprocedural states: Secondary | ICD-10-CM | POA: Diagnosis not present

## 2017-02-14 DIAGNOSIS — M545 Low back pain: Secondary | ICD-10-CM | POA: Insufficient documentation

## 2017-02-14 DIAGNOSIS — G90522 Complex regional pain syndrome I of left lower limb: Secondary | ICD-10-CM | POA: Diagnosis not present

## 2017-02-14 DIAGNOSIS — Z760 Encounter for issue of repeat prescription: Secondary | ICD-10-CM | POA: Diagnosis present

## 2017-02-14 DIAGNOSIS — G8929 Other chronic pain: Secondary | ICD-10-CM | POA: Diagnosis not present

## 2017-02-14 DIAGNOSIS — K219 Gastro-esophageal reflux disease without esophagitis: Secondary | ICD-10-CM | POA: Diagnosis not present

## 2017-02-14 MED ORDER — MORPHINE SULFATE 15 MG PO TABS: 15.0000 mg | ORAL_TABLET | Freq: Three times a day (TID) | ORAL | 0 refills | Status: DC

## 2017-02-14 NOTE — Progress Notes (Signed)
Subjective:    Patient ID: Paige Arnold, female    DOB: [birth year], 76 y.o.   MRN: [id number]  HPI: Paige Arnold is a 76year old female who returns for follow up appointmentfor chronic pain and medication refill. She states her pain is located in her lower back andleft knee.She rates her pain 4.Her current exercise regime is walking short distances with cane or walker and performing light house work.   Her last UDS was on 10/16/2016, it was consistent.    Pain Inventory Average Pain 5 Pain Right Now 5 My pain is na  In the last 24 hours, has pain interfered with the following? General activity 2 Relation with others 1 Enjoyment of life 5 What TIME of day is your pain at its worst? all Sleep (in general) Fair  Pain is worse with: walking, bending and standing Pain improves with: medication Relief from Meds: na  Mobility use a cane  Function disabled: date disabled . retired I need assistance with the following:  meal prep, household duties and shopping  Neuro/Psych trouble walking  Prior Studies Any changes since last visit?  no  Physicians involved in your care Any changes since last visit?  no   Family History  Problem Relation Age of Onset  . Kidney disease Mother   . Heart disease Father   . Diabetes Brother   . Alcohol abuse Brother   . Depression Brother   . Sarcoidosis Brother   . ALS Brother        Deceased, 42s  . Heart disease Brother   . Bipolar disorder Daughter    Social History   Social History  . Marital status: Married    Spouse name: N/A  . Number of children: 3  . Years of education: N/A   Occupational History  . retired    Social History Main Topics  . Smoking status: Never Smoker  . Smokeless tobacco: Never Used  . Alcohol use No  . Drug use: No  . Sexual activity: Yes   Other Topics Concern  . None   Social History Narrative   She lives with husband and daughter.  Three grown children.   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   Highest level of education:  Graduated high school      Past Surgical History:  Procedure Laterality Date  . AV FISTULA PLACEMENT Left 02/16/2015   Procedure: LEFT ARM BRACHIOCEPHALIC (AV) FISTULA CREATION;  Surgeon: Mal Misty, MD;  Location: [place];  Service: Vascular;  Laterality: Left;  . CHOLECYSTECTOMY    . INTRAOCULAR LENS INSERTION Right 10-21-13  . LIGATION OF COMPETING BRANCHES OF ARTERIOVENOUS FISTULA Left 02/16/2015   Procedure: LIGATION OF COMPETING BRANCHES OF LEFT BRACHIOCEPHALIC ARTERIOVENOUS FISTULA;  Surgeon: Mal Misty, MD;  Location: [place];  Service: Vascular;  Laterality: Left;  . PARTIAL HIP ARTHROPLASTY    . PARTIAL KNEE ARTHROPLASTY     left  . TEMPOROMANDIBULAR JOINT SURGERY    . TONSILLECTOMY    . TUBAL LIGATION     Past Medical History:  Diagnosis Date  . Arthritis   . Diabetes mellitus   . Diabetic neuropathy ([place])   . GERD (gastroesophageal reflux disease)   . Hypertension   . Kidney disease   . Pneumonia Oct. 2016  . Shortness of breath dyspnea    when I first lay down,  then it gets better  . Sleep apnea    does not wear CPAP   . Thyroid disorder    BP 124/70   Pulse (!) 56   SpO2 95%   Opioid Risk Score:  3 Fall Risk Score:  `1  Depression screen PHQ 2/9  Depression screen [facility] 2/9 02/14/2017 11/13/2016 02/06/2016 12/07/2015 10/03/2015 09/05/2015 02/03/2015  Decreased Interest 0 0 0 0 3 0 1  Down, Depressed, Hopeless 0 0 0 0 3 0 2  PHQ - 2 Score 0 0 0 0 6 0 3  Altered sleeping - - - - - - 2  Tired, decreased energy - - - - - - 2  Change in appetite - - - - - - 1  Feeling bad or failure about yourself  - - - - - - 0  Trouble concentrating - - - - - - 1  Moving slowly or fidgety/restless - - - - - - 1  Suicidal thoughts - - - - - - 0  PHQ-9 Score - - - - - - 10   Review of Systems  Constitutional: Positive for unexpected weight  change.  Eyes: Negative.   Respiratory: Negative.   Cardiovascular: Negative.   Gastrointestinal: Negative.   Endocrine:       High blood sugars  Genitourinary: Positive for dysuria.  Musculoskeletal: Positive for gait problem.  Skin: Negative.   Allergic/Immunologic: Negative.   Hematological: Negative.   Psychiatric/Behavioral: Negative.   All other systems reviewed and are negative.      Objective:   Physical Exam  Constitutional: She is oriented to person, place, and time. She appears well-developed and well-nourished.  HENT:  Head: Normocephalic and atraumatic.  Neck: Normal range of motion. Neck supple.  Cardiovascular: Normal rate and regular rhythm.   Pulmonary/Chest: Effort normal and breath sounds normal.  Musculoskeletal:  Normal Muscle Bulk and Muscle Testing Reveals: Upper Extremities: Full ROM and Muscle Strength 4/5 Lumbar Paraspinal Tenderness: L-3-L-5 Lower Extremities: Full ROM and Muscle Strength 5/5 Left Lower Extremity Flexion Produces Pain into [place] Arises from Table Slowly using Straight Cane for Support Antalgic Gait   Neurological: She is alert and oriented to person, place, and time.  Skin: Skin is warm and dry.  Psychiatric: She has a normal mood and affect.  Nursing note and vitals reviewed.         Assessment & Plan:  1.Spinal stenosis chronic low back pain radiating into the legs:08/02//2018:  Continue:MSIR 15 mg take one tablet by mouththree times daily #90. We will continue the opioid monitoring program, this consists of regular clinic visits, examinations, urine drug screen, pill counts as well as use of [place] Controlled Substance Reporting System. 2. Diabetic Neuropathy: Continue Gabapentin.02/14/2017 3. Left Knee Pain: S/P Peripheral Nerve Block received 6-8 hours of relief.Continue Current Medication and exercise Regimen. [name] refuses Cryoablation. ( RSD) Continue Lidoderm Patches.02/14/2017 4. Left Hip  Fracture S/P hemiarthroplasty: Orthopedist Following. 02/14/2017 5. Left Greater Trochanteric Bursitis: No complaints today. Continue Heat and Ice Therapy. 02/14/2017  20 minutes of face to face patient care time was spent during this visit. All questions were encouraged and answered.  F/U in 1 month

## 2017-02-18 ENCOUNTER — Ambulatory Visit (INDEPENDENT_AMBULATORY_CARE_PROVIDER_SITE_OTHER): Payer: Medicare Other | Admitting: Neurology

## 2017-02-18 ENCOUNTER — Encounter: Payer: Self-pay | Admitting: Neurology

## 2017-02-18 VITALS — BP 130/70 | HR 58 | Ht 65.0 in | Wt 212.4 lb

## 2017-02-18 DIAGNOSIS — E0842 Diabetes mellitus due to underlying condition with diabetic polyneuropathy: Secondary | ICD-10-CM | POA: Diagnosis not present

## 2017-02-18 MED ORDER — GABAPENTIN 300 MG PO CAPS: ORAL_CAPSULE | ORAL | 3 refills | Status: DC

## 2017-02-18 MED ORDER — DONEPEZIL HCL 10 MG PO TABS: 10.0000 mg | ORAL_TABLET | Freq: Every day | ORAL | 3 refills | Status: DC

## 2017-02-18 NOTE — Progress Notes (Signed)
Follow-up Visit   Date: 02/18/17    BONNY VANLEEUWEN MRN: [id number] DOB: 1941/02/03   Interim History: Paige Arnold is a 76 y.o. right-handed Caucasian female with hypertension, diabetes mellitus, depression/anxiety, hypothyroidism, and chronic pain returning for evaluation of memory loss, neuropathy, and tremors. The patient was accompanied to the clinic by self.  History of present illness: She has neuropathy for the past two years, described throbbing.  She does not burning or tingling, but says that she cannot feel her feet.  The numbness extends to where her ankles are.  She endorses problems with balance and has been using a cane since her knee surgery in 2001.  She takes gabapentin 400mg  in the morning and 800mg  at bedtime.  She reports having previous NCS/EMG of her hands and fee (results unknown)t.  She also complains of memory problems for a number of years.  She does not drive or pay her own bills.  She does not cook, clean, or have any hobbies.  Her day is spent watching TV and sleeping. She does not exercise.  She takes aricept 10mg  daily. She feels that her memory is slowly worsening.  She does not keep up with the date or appointments.  She forgets peoples names.  She does not repeat herself.  She cannot multitask. She says that neuropsychological testing as well as imaging has been performed, but I do not have these records.  Starting in 2015, she developed tremors which as noticeable when she holds objects.  She feels like her whole body has an internal tremor, but does not actually see it.    She endorses feeling of anxiety and depressed mood.  She has seen psychiatry and counsellors in the past, but they didn't "click" with her so she stopped seeing them.  She says "I want someone to listen to me" and began crying.  No SI/HI.  Of note, she has RSD and lumbar stenosis and is on morphine 15mg  qh6 prn pain, followed by Dr. Letta Pate.  UPDATE 02/18/2017:  She is here for 1  year follow-up visit.  Unfortunately, she has no relief with neuropathic pain despite being on gabapentin 300mg  in the morning and 600mg  at bedtime.  She had CKD which prevents further titration. She also takes morphine 15mg  TID for her back pain which is prescribed by pain management and Cymbalta 30mg  daily. She denies any new or worsening tremors or memory changes.  She is unable to take care of IADLs such as cooking, cleaning, and managing medications due to her chronic pain.  Medications:  Current Outpatient Prescriptions on File Prior to Visit  Medication Sig Dispense Refill  . amLODipine (NORVASC) 5 MG tablet Take 5 mg by mouth daily. 1/2 tablet daily    . aspirin 81 MG tablet Take 81 mg by mouth daily.    . cloNIDine (CATAPRES) 0.1 MG tablet Take 0.1-0.2 mg by mouth 2 (two) times daily. 2 tablets in the morning, 1 tablet in the evening    . dicyclomine (BENTYL) 20 MG tablet Take 20 mg by mouth every 6 (six) hours as needed for spasms.    . DULoxetine (CYMBALTA) 30 MG capsule Take 30 mg by mouth daily.    Marland Kitchen glimepiride (AMARYL) 2 MG tablet Take 2 mg by mouth daily with breakfast.    . levothyroxine (SYNTHROID, LEVOTHROID) 125 MCG tablet Take 125 mcg by mouth daily before breakfast.    . morphine (MSIR) 15 MG tablet Take 1 tablet (15 mg total) by  mouth 3 (three) times daily. 90 tablet 0  . multivitamin-iron-minerals-folic acid (CENTRUM) chewable tablet Chew 1 tablet by mouth daily.    . Omega-3 Fatty Acids (FISH OIL) 1000 MG CAPS Take by mouth.    . pravastatin (PRAVACHOL) 40 MG tablet Take 40 mg by mouth daily.     No current facility-administered medications on file prior to visit.     Allergies:  Allergies  Allergen Reactions  . Furosemide     Other reaction(s): Malaise (intolerance)  . Sulfa Antibiotics Itching    Review of Systems:  CONSTITUTIONAL: No fevers, chills, night sweats, or weight loss.  EYES: No visual changes or eye pain ENT: No hearing changes.  No history of  nose bleeds.   RESPIRATORY: No cough, wheezing and shortness of breath.   CARDIOVASCULAR: Negative for chest pain, and palpitations.   GI: Negative for abdominal discomfort, blood in stools or black stools.  No recent change in bowel habits.   GU:  No history of incontinence.   MUSCLOSKELETAL: ++history of joint pain or swelling.  +myalgias.   SKIN: Negative for lesions, rash, and itching.   ENDOCRINE: Negative for cold or heat intolerance, polydipsia or goiter.   PSYCH:  + depression or anxiety symptoms.   NEURO: As Above.   Vital Signs:  BP 130/70   Pulse (!) 58   Ht 5\' 5"  (1.651 m)   Wt 212 lb 7 oz (96.4 kg)   SpO2 96%   BMI 35.35 kg/m   Neurological Exam: MENTAL STATUS including orientation to time, place, person, recent and remote memory, attention span and concentration, language, and fund of knowledge is normal.  Speech is not dysarthric.  CRANIAL NERVES:  Face is symmetric.   MOTOR:  Motor strength is 5/5 in all extremities. No tremors.  MSRs:  Reflexes are 2+/4 throughout, except absent Achilles.  SENSORY:  Diminished at ankles bilaterally.  COORDINATION/GAIT:  Gait is slow, antalgic due to left knee pain, and assisted with cane.  Data: Lab Results  Component Value Date   TSH 0.75 11/18/2014   Lab Results  Component Value Date   HGBA1C 6.3 (H) 03/09/2014     IMPRESSION/PLAN: 1.  Diabetic peripheral neuropathy - worsening Continue gabapentin 300mg  in the morning and 600mg  at bedtime.  Avoid further titration due to CKD Recommend trying OTC lidocaine ointment to feet as needed She also takes morphine 15mg  TID for low back pain and knee pain which is written by pain management.    She is on Cymbalta 30mg  daily but unsure whether this is for pain or depression  2.  Benign essential tremors, stable.  Not apparent on exam.  Her medication list shows propranolol 20mg  BID, but she is unsure why she takes this medication.  Encouraged patient to bring her  medication bottles to her visit next time.   3.  Cognitive impairment, multifactorial (depression, medications, dementia syndrome).  She declined repeat neuropsychological testing.  Continue Aricept 10mg  daily.    Return to clinic in 6 months  The duration of this appointment visit was 25 minutes of face-to-face time with the patient.  Greater than 50% of this time was spent in counseling, explanation of diagnosis, planning of further management, and coordination of care.   Thank you for allowing me to participate in patient's care.  If I can answer any additional questions, I would be pleased to do so.    Sincerely,    Somara Frymire K. Posey Pronto, DO

## 2017-02-18 NOTE — Patient Instructions (Addendum)
Continue gabapentin 300mg  in the morning and 600mg  at bedtime You can try using lidocaine ointment to your feet to see if that helps  Return to clinic in 1 year

## 2017-02-26 ENCOUNTER — Telehealth: Payer: Self-pay | Admitting: Physical Medicine & Rehabilitation

## 2017-02-26 NOTE — Telephone Encounter (Signed)
Merry Proud [facility] called regarding work comp claim [id number]-- returned call to determine purpose of call left message on voicemail

## 2017-02-27 ENCOUNTER — Telehealth: Payer: Self-pay | Admitting: Physical Medicine & Rehabilitation

## 2017-02-27 NOTE — Telephone Encounter (Signed)
Paige Arnold with [facility] is returning a call to Paige Arnold about coordinating a urine drug screen.  Please call him back at [phone].

## 2017-02-27 NOTE — Telephone Encounter (Signed)
Paige Arnold with [facility] needs UDS next appt - he will fax request and mail package.

## 2017-03-19 ENCOUNTER — Encounter: Payer: Self-pay | Admitting: Registered Nurse

## 2017-03-19 ENCOUNTER — Encounter: Payer: Worker's Compensation | Attending: Registered Nurse | Admitting: Registered Nurse

## 2017-03-19 VITALS — BP 126/55 | HR 55

## 2017-03-19 DIAGNOSIS — M7062 Trochanteric bursitis, left hip: Secondary | ICD-10-CM | POA: Diagnosis not present

## 2017-03-19 DIAGNOSIS — Z79899 Other long term (current) drug therapy: Secondary | ICD-10-CM | POA: Diagnosis not present

## 2017-03-19 DIAGNOSIS — M545 Low back pain: Secondary | ICD-10-CM | POA: Insufficient documentation

## 2017-03-19 DIAGNOSIS — Z818 Family history of other mental and behavioral disorders: Secondary | ICD-10-CM | POA: Diagnosis not present

## 2017-03-19 DIAGNOSIS — M48061 Spinal stenosis, lumbar region without neurogenic claudication: Secondary | ICD-10-CM | POA: Diagnosis not present

## 2017-03-19 DIAGNOSIS — Z8249 Family history of ischemic heart disease and other diseases of the circulatory system: Secondary | ICD-10-CM | POA: Insufficient documentation

## 2017-03-19 DIAGNOSIS — M1990 Unspecified osteoarthritis, unspecified site: Secondary | ICD-10-CM | POA: Diagnosis not present

## 2017-03-19 DIAGNOSIS — Z841 Family history of disorders of kidney and ureter: Secondary | ICD-10-CM | POA: Diagnosis not present

## 2017-03-19 DIAGNOSIS — M4800 Spinal stenosis, site unspecified: Secondary | ICD-10-CM | POA: Diagnosis not present

## 2017-03-19 DIAGNOSIS — G90522 Complex regional pain syndrome I of left lower limb: Secondary | ICD-10-CM | POA: Diagnosis not present

## 2017-03-19 DIAGNOSIS — E079 Disorder of thyroid, unspecified: Secondary | ICD-10-CM | POA: Diagnosis not present

## 2017-03-19 DIAGNOSIS — Z96642 Presence of left artificial hip joint: Secondary | ICD-10-CM | POA: Insufficient documentation

## 2017-03-19 DIAGNOSIS — N289 Disorder of kidney and ureter, unspecified: Secondary | ICD-10-CM | POA: Insufficient documentation

## 2017-03-19 DIAGNOSIS — I10 Essential (primary) hypertension: Secondary | ICD-10-CM | POA: Insufficient documentation

## 2017-03-19 DIAGNOSIS — K219 Gastro-esophageal reflux disease without esophagitis: Secondary | ICD-10-CM | POA: Insufficient documentation

## 2017-03-19 DIAGNOSIS — G8929 Other chronic pain: Secondary | ICD-10-CM | POA: Insufficient documentation

## 2017-03-19 DIAGNOSIS — M25562 Pain in left knee: Secondary | ICD-10-CM | POA: Diagnosis not present

## 2017-03-19 DIAGNOSIS — G894 Chronic pain syndrome: Secondary | ICD-10-CM

## 2017-03-19 DIAGNOSIS — Z833 Family history of diabetes mellitus: Secondary | ICD-10-CM | POA: Insufficient documentation

## 2017-03-19 DIAGNOSIS — E1140 Type 2 diabetes mellitus with diabetic neuropathy, unspecified: Secondary | ICD-10-CM | POA: Diagnosis not present

## 2017-03-19 DIAGNOSIS — G4730 Sleep apnea, unspecified: Secondary | ICD-10-CM | POA: Insufficient documentation

## 2017-03-19 DIAGNOSIS — E1149 Type 2 diabetes mellitus with other diabetic neurological complication: Secondary | ICD-10-CM | POA: Diagnosis not present

## 2017-03-19 DIAGNOSIS — Z5181 Encounter for therapeutic drug level monitoring: Secondary | ICD-10-CM

## 2017-03-19 MED ORDER — MORPHINE SULFATE 15 MG PO TABS: 15.0000 mg | ORAL_TABLET | Freq: Three times a day (TID) | ORAL | 0 refills | Status: DC

## 2017-03-19 NOTE — Progress Notes (Signed)
Subjective:    Patient ID: Paige Arnold, female    DOB: July 02, 1941, 76 y.o.   MRN: [id number]  HPI: Paige Arnold is a 76year old female who returns for follow up appointmentfor chronic pain and medication refill. She states her pain is located in her lower back,left knee and left heel..She rates her pain 5.Her current exercise regime is walking short distances with cane or walker and performing light house work.   Her last UDS was on 10/16/2016, it was consistent. She had a Urine obtain today for her Workman's Compensation.   Pain Inventory Average Pain 5 Pain Right Now 5 My pain is na  In the last 24 hours, has pain interfered with the following? General activity 5 Relation with others 2 Enjoyment of life 2 What TIME of day is your pain at its worst? all Sleep (in general) NA  Pain is worse with: walking, bending and standing Pain improves with: rest, heat/ice, pacing activities and medication Relief from Meds: 4  Mobility use a cane use a walker ability to climb steps?  no do you drive?  no  Function disabled: date disabled . retired I need assistance with the following:  meal prep, household duties and shopping  Neuro/Psych trouble walking  Prior Studies Any changes since last visit?  no  Physicians involved in your care Any changes since last visit?  no   Family History  Problem Relation Age of Onset  . Kidney disease Mother   . Heart disease Father   . Diabetes Brother   . Alcohol abuse Brother   . Depression Brother   . Sarcoidosis Brother   . ALS Brother        Deceased, 81s  . Heart disease Brother   . Bipolar disorder Daughter    Social History   Social History  . Marital status: Married    Spouse name: N/A  . Number of children: 3  . Years of education: N/A   Occupational History  . retired    Social History Main Topics  . Smoking status: Never Smoker  . Smokeless tobacco: Never Used  . Alcohol use No  . Drug use: No  .  Sexual activity: Yes   Other Topics Concern  . None   Social History Narrative   She lives with husband and daughter.  Three grown children.   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     She took early retired at 40 because of problems of RSD.   Highest level of education:  Graduated high school      Past Surgical History:  Procedure Laterality Date  . AV FISTULA PLACEMENT Left 02/16/2015   Procedure: LEFT ARM BRACHIOCEPHALIC (AV) FISTULA CREATION;  Surgeon: Mal Misty, MD;  Location: [place];  Service: Vascular;  Laterality: Left;  . CHOLECYSTECTOMY    . INTRAOCULAR LENS INSERTION Right 10-21-13  . LIGATION OF COMPETING BRANCHES OF ARTERIOVENOUS FISTULA Left 02/16/2015   Procedure: LIGATION OF COMPETING BRANCHES OF LEFT BRACHIOCEPHALIC ARTERIOVENOUS FISTULA;  Surgeon: Mal Misty, MD;  Location: [place];  Service: Vascular;  Laterality: Left;  . PARTIAL HIP ARTHROPLASTY    . PARTIAL KNEE ARTHROPLASTY     left  . TEMPOROMANDIBULAR JOINT SURGERY    . TONSILLECTOMY    . TUBAL LIGATION     Past Medical History:  Diagnosis Date  . Arthritis   . Diabetes mellitus   . Diabetic neuropathy ([place])   . GERD (gastroesophageal reflux  disease)   . Hypertension   . Kidney disease   . Pneumonia Oct. 2016  . Shortness of breath dyspnea    when I first lay down, then it gets better  . Sleep apnea    does not wear CPAP   . Thyroid disorder    BP (!) 126/55   Pulse (!) 55   SpO2 94%   Opioid Risk Score:  3 Fall Risk Score:  `1  Depression screen PHQ 2/9  Depression screen [facility] 2/9 03/19/2017 02/14/2017 11/13/2016 02/06/2016 12/07/2015 10/03/2015 09/05/2015  Decreased Interest 0 0 0 0 0 3 0  Down, Depressed, Hopeless 0 0 0 0 0 3 0  PHQ - 2 Score 0 0 0 0 0 6 0  Altered sleeping - - - - - - -  Tired, decreased energy - - - - - - -  Change in appetite - - - - - - -  Feeling bad or failure about yourself  - - - - - - -  Trouble concentrating - - - - - - -  Moving slowly or  fidgety/restless - - - - - - -  Suicidal thoughts - - - - - - -  PHQ-9 Score - - - - - - -    Review of Systems  Constitutional: Negative.   HENT: Negative.   Eyes: Negative.   Respiratory: Negative.   Cardiovascular: Negative.   Gastrointestinal: Positive for constipation.  Endocrine: Negative.   Genitourinary: Negative.   Musculoskeletal: Negative.   Skin: Negative.   Allergic/Immunologic: Negative.   Neurological: Negative.   Hematological: Negative.   Psychiatric/Behavioral: Negative.   All other systems reviewed and are negative.      Objective:   Physical Exam  Constitutional: She is oriented to person, place, and time. She appears well-developed and well-nourished.  HENT:  Head: Normocephalic and atraumatic.  Neck: Normal range of motion. Neck supple.  Cardiovascular: Normal rate and regular rhythm.   Pulmonary/Chest: Effort normal and breath sounds normal.  Musculoskeletal:  Normal Muscle Bulk and Muscle Testing Reveals: Upper Extremities: Full ROM and Muscle Strength 5/5 Thoracic Paraspinal Tenderness: T-3-T-7 Lumbar Paraspinal tenderness: L-3-L-5 Lower Extremities: Full ROM and Muscle Strength 5/5 Arises from Table with ease using straight cane for support Narrow Based gait  Neurological: She is alert and oriented to person, place, and time.  Skin: Skin is warm and dry.  Psychiatric: She has a normal mood and affect.  Nursing note and vitals reviewed.         Assessment & Plan:  1.Spinal stenosis chronic low back pain radiating into the legs:09/04//2018:  Continue:MSIR 15 mg take one tablet by mouththree times daily #90. We will continue the opioid monitoring program, this consists of regular clinic visits, examinations, urine drug screen, pill counts as well as use of [place] Controlled Substance Reporting System. 2. Diabetic Neuropathy: Continue Gabapentin.03/19/2017 3. Left Knee Pain: S/P Peripheral Nerve Block received 6-8 hours of  relief.Continue Current Medication and exercise Regimen. Ms. Werntz refuses Cryoablation. ( RSD) Continue Lidoderm Patches.03/19/2017 4. Left Hip Fracture S/P hemiarthroplasty: Orthopedist Following. 03/19/2017 5. Left Greater Trochanteric Bursitis: No complaints today. Continue Heat and Ice Therapy. 03/19/2017  20 minutes of face to face patient care time was spent during this visit. All questions were encouraged and answered.  F/U in 1 month

## 2017-03-21 ENCOUNTER — Telehealth: Payer: Self-pay | Admitting: Registered Nurse

## 2017-03-21 NOTE — Telephone Encounter (Signed)
On 03/21/2017 the  [facility] was reviewed no conflict was seen on the Norco with multiple prescribers. Paige Arnold has a signed narcotic contract with our office. If there were any discrepancies this would have been reported to her physician.

## 2017-04-16 ENCOUNTER — Ambulatory Visit (HOSPITAL_BASED_OUTPATIENT_CLINIC_OR_DEPARTMENT_OTHER): Payer: Worker's Compensation | Admitting: Physical Medicine & Rehabilitation

## 2017-04-16 ENCOUNTER — Encounter: Payer: Self-pay | Admitting: Physical Medicine & Rehabilitation

## 2017-04-16 ENCOUNTER — Encounter: Payer: Worker's Compensation | Attending: Physical Medicine & Rehabilitation

## 2017-04-16 VITALS — BP 144/84 | HR 54

## 2017-04-16 DIAGNOSIS — M25562 Pain in left knee: Secondary | ICD-10-CM | POA: Diagnosis not present

## 2017-04-16 DIAGNOSIS — Z5181 Encounter for therapeutic drug level monitoring: Secondary | ICD-10-CM | POA: Insufficient documentation

## 2017-04-16 DIAGNOSIS — Z79899 Other long term (current) drug therapy: Secondary | ICD-10-CM | POA: Diagnosis not present

## 2017-04-16 DIAGNOSIS — G90522 Complex regional pain syndrome I of left lower limb: Secondary | ICD-10-CM | POA: Diagnosis not present

## 2017-04-16 DIAGNOSIS — G894 Chronic pain syndrome: Secondary | ICD-10-CM | POA: Diagnosis not present

## 2017-04-16 DIAGNOSIS — Z96652 Presence of left artificial knee joint: Secondary | ICD-10-CM | POA: Diagnosis not present

## 2017-04-16 DIAGNOSIS — M48061 Spinal stenosis, lumbar region without neurogenic claudication: Secondary | ICD-10-CM | POA: Diagnosis not present

## 2017-04-16 MED ORDER — LIDOCAINE 5 % EX PTCH: 1.0000 | MEDICATED_PATCH | CUTANEOUS | 1 refills | Status: DC

## 2017-04-16 MED ORDER — MORPHINE SULFATE 15 MG PO TABS: 15.0000 mg | ORAL_TABLET | Freq: Three times a day (TID) | ORAL | 0 refills | Status: DC

## 2017-04-16 NOTE — Progress Notes (Signed)
Subjective:    Patient ID: Paige Arnold, female    DOB: [birth year], 76 y.o.   MRN: [id number]  HPI PMP review, narcotics, score 401. Sedative 180. Overall risk of overdose 310, Discussed results with patient Patient uses some type of sweet liquid for bowel constipation. Not sure the name. Question sorbitol  Patient remains independent with all her self-care and mobility She follows up monthly with mid-level provider at physical medicine rehabilitation clinic Last urine screen for 10/02/2016 was consistent with medications prescribed  Chronic knee pain, has had left L3 nerve root injection without improvements Pain Inventory Average Pain 4 Pain Right Now 4 My pain is .  In the last 24 hours, has pain interfered with the following? General activity 2 Relation with others 2 Enjoyment of life 5 What TIME of day is your pain at its worst? all Sleep (in general) Fair  Pain is worse with: walking, bending, standing and some activites Pain improves with: rest, heat/ice and medication Relief from Meds: 5  Mobility use a cane  Function retired  Neuro/Psych No problems in this area  Prior Studies Any changes since last visit?  no  Physicians involved in your care Any changes since last visit?  no   Family History  Problem Relation Age of Onset  . Kidney disease Mother   . Heart disease Father   . Diabetes Brother   . Alcohol abuse Brother   . Depression Brother   . Sarcoidosis Brother   . ALS Brother        Deceased, 37s  . Heart disease Brother   . Bipolar disorder Daughter    Social History   Social History  . Marital status: Married    Spouse name: N/A  . Number of children: 3  . Years of education: N/A   Occupational History  . retired    Social History Main Topics  . Smoking status: Never Smoker  . Smokeless tobacco: Never Used  . Alcohol use No  . Drug use: No  . Sexual activity: Yes   Other Topics Concern  . Not on file   Social History  Narrative   She lives with husband and daughter.  Three grown children.   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   Highest level of education:  Graduated high school      Past Surgical History:  Procedure Laterality Date  . AV FISTULA PLACEMENT Left 02/16/2015   Procedure: LEFT ARM BRACHIOCEPHALIC (AV) FISTULA CREATION;  Surgeon: Mal Misty, MD;  Location: [place];  Service: Vascular;  Laterality: Left;  . CHOLECYSTECTOMY    . INTRAOCULAR LENS INSERTION Right 10-21-13  . LIGATION OF COMPETING BRANCHES OF ARTERIOVENOUS FISTULA Left 02/16/2015   Procedure: LIGATION OF COMPETING BRANCHES OF LEFT BRACHIOCEPHALIC ARTERIOVENOUS FISTULA;  Surgeon: Mal Misty, MD;  Location: [place];  Service: Vascular;  Laterality: Left;  . PARTIAL HIP ARTHROPLASTY    . PARTIAL KNEE ARTHROPLASTY     left  . TEMPOROMANDIBULAR JOINT SURGERY    . TONSILLECTOMY    . TUBAL LIGATION     Past Medical History:  Diagnosis Date  . Arthritis   . Diabetes mellitus   . Diabetic neuropathy ([place])   . GERD (gastroesophageal reflux disease)   . Hypertension   . Kidney disease   . Pneumonia Oct. 2016  . Shortness of breath dyspnea    when  I first lay down, then it gets better  . Sleep apnea    does not wear CPAP   . Thyroid disorder    There were no vitals taken for this visit.  Opioid Risk Score:   Fall Risk Score:  `1  Depression screen PHQ 2/9  Depression screen [facility] 2/9 03/19/2017 02/14/2017 11/13/2016 02/06/2016 12/07/2015 10/03/2015 09/05/2015  Decreased Interest 0 0 0 0 0 3 0  Down, Depressed, Hopeless 0 0 0 0 0 3 0  PHQ - 2 Score 0 0 0 0 0 6 0  Altered sleeping - - - - - - -  Tired, decreased energy - - - - - - -  Change in appetite - - - - - - -  Feeling bad or failure about yourself  - - - - - - -  Trouble concentrating - - - - - - -  Moving slowly or fidgety/restless - - - - - - -  Suicidal thoughts - - - - - - -  PHQ-9  Score - - - - - - -     Review of Systems  Constitutional: Positive for unexpected weight change.  HENT: Negative.   Eyes: Negative.   Respiratory: Negative.   Cardiovascular: Negative.   Gastrointestinal: Positive for constipation.  Endocrine: Negative.   Genitourinary: Negative.   Musculoskeletal: Negative.   Skin: Negative.   Allergic/Immunologic: Negative.   Neurological: Negative.   Hematological: Negative.   Psychiatric/Behavioral: Negative.   All other systems reviewed and are negative.      Objective:   Physical Exam  Constitutional: She is oriented to person, place, and time. She appears well-developed and well-nourished. No distress.  HENT:  Head: Normocephalic and atraumatic.  Eyes: Pupils are equal, round, and reactive to light. Conjunctivae and EOM are normal. Right eye exhibits no discharge. Left eye exhibits no discharge. No scleral icterus.  Neck: Normal range of motion. Neck supple. No JVD present. No tracheal deviation present.  Pulmonary/Chest: No stridor.  Musculoskeletal:       Lumbar back: She exhibits decreased range of motion. She exhibits no tenderness and no deformity.  Negative straight leg raising  Neurological: She is alert and oriented to person, place, and time.  Motor strength is 5/5 bilateral hip flexor, knee extensor, ankle dorsiflexor  Skin: Skin is warm and dry. She is not diaphoretic.  Psychiatric: She has a normal mood and affect. Her behavior is normal. Judgment and thought content normal.  Nursing note and vitals reviewed.  Left knee has sensitivity to touch along the incision line. She has reduced knee flexion, but full knee extension. No evidence of effusion       Assessment & Plan:  1. Chronic left knee pain postop. TKR . possible reflex sympathetic dystrophy, as well as moderately severe L3-L4 spinal stenosis. She did not tolerate the anesthetic phase of the cryoablation, so the full procedure was not performed. We discussed  risks and benefits of her narcotic analgesics. She has been compliant with her follow-up and her medication regimen  Discussed PMP results  Continue morphine sulfate immediate release 15 mg 3 times a day We'll resume Lidoderm patch to the left knee on 12 hours, off 12 hours  Nurse practitioner. Visit 1 month

## 2017-05-14 ENCOUNTER — Ambulatory Visit: Payer: Self-pay | Admitting: Registered Nurse

## 2017-05-16 ENCOUNTER — Encounter: Payer: Self-pay | Admitting: Registered Nurse

## 2017-05-16 ENCOUNTER — Encounter: Payer: Worker's Compensation | Attending: Registered Nurse | Admitting: Registered Nurse

## 2017-05-16 VITALS — BP 139/85 | HR 62

## 2017-05-16 DIAGNOSIS — G894 Chronic pain syndrome: Secondary | ICD-10-CM | POA: Diagnosis not present

## 2017-05-16 DIAGNOSIS — M25562 Pain in left knee: Secondary | ICD-10-CM | POA: Diagnosis not present

## 2017-05-16 DIAGNOSIS — G90522 Complex regional pain syndrome I of left lower limb: Secondary | ICD-10-CM

## 2017-05-16 DIAGNOSIS — M48061 Spinal stenosis, lumbar region without neurogenic claudication: Secondary | ICD-10-CM | POA: Diagnosis not present

## 2017-05-16 DIAGNOSIS — Z5181 Encounter for therapeutic drug level monitoring: Secondary | ICD-10-CM

## 2017-05-16 DIAGNOSIS — G8929 Other chronic pain: Secondary | ICD-10-CM | POA: Diagnosis not present

## 2017-05-16 DIAGNOSIS — K219 Gastro-esophageal reflux disease without esophagitis: Secondary | ICD-10-CM | POA: Diagnosis not present

## 2017-05-16 DIAGNOSIS — E1140 Type 2 diabetes mellitus with diabetic neuropathy, unspecified: Secondary | ICD-10-CM | POA: Diagnosis not present

## 2017-05-16 DIAGNOSIS — Z5189 Encounter for other specified aftercare: Secondary | ICD-10-CM | POA: Insufficient documentation

## 2017-05-16 DIAGNOSIS — G4730 Sleep apnea, unspecified: Secondary | ICD-10-CM | POA: Diagnosis not present

## 2017-05-16 DIAGNOSIS — E079 Disorder of thyroid, unspecified: Secondary | ICD-10-CM | POA: Diagnosis not present

## 2017-05-16 DIAGNOSIS — M4800 Spinal stenosis, site unspecified: Secondary | ICD-10-CM | POA: Diagnosis not present

## 2017-05-16 DIAGNOSIS — Z96652 Presence of left artificial knee joint: Secondary | ICD-10-CM

## 2017-05-16 DIAGNOSIS — I10 Essential (primary) hypertension: Secondary | ICD-10-CM | POA: Diagnosis not present

## 2017-05-16 DIAGNOSIS — Z79899 Other long term (current) drug therapy: Secondary | ICD-10-CM

## 2017-05-16 DIAGNOSIS — Z760 Encounter for issue of repeat prescription: Secondary | ICD-10-CM | POA: Insufficient documentation

## 2017-05-16 MED ORDER — MORPHINE SULFATE 15 MG PO TABS: 15.0000 mg | ORAL_TABLET | Freq: Three times a day (TID) | ORAL | 0 refills | Status: DC

## 2017-05-16 NOTE — Progress Notes (Signed)
Subjective:    Patient ID: Paige Arnold, female    DOB: 1940-12-25, 76 y.o.   MRN: [id number]  HPI: Paige Arnold is a 76year old female who returns for follow up appointmentfor chronic pain and medication refill. Paige Arnold states her pain is located in her lower back and left knee.Paige Arnold rates her pain 5.Her current exercise regime is walking short distances with cane or walker and performing light house work.   Paige Arnold Morphine equivalent is 45.00 MME  Her last UDS was on 10/16/2016, it was consistent.   Pain Inventory Average Pain 4 Pain Right Now 5 My pain is sharp, burning, dull, stabbing, tingling and aching  In the last 24 hours, has pain interfered with the following? General activity 2 Relation with others 2 Enjoyment of life 5 What TIME of day is your pain at its worst? all Sleep (in general) Fair  Pain is worse with: walking, bending, standing and some activites Pain improves with: rest, heat/ice and medication Relief from Meds: 4  Mobility use a cane ability to climb steps?  no do you drive?  no  Function disabled: date disabled . retired I need assistance with the following:  meal prep, household duties and shopping  Neuro/Psych weakness trouble walking  Prior Studies Any changes since last visit?  no  Physicians involved in your care Any changes since last visit?  no   Family History  Problem Relation Age of Onset  . Kidney disease Mother   . Heart disease Father   . Diabetes Brother   . Alcohol abuse Brother   . Depression Brother   . Sarcoidosis Brother   . ALS Brother        Deceased, 88s  . Heart disease Brother   . Bipolar disorder Daughter    Social History   Social History  . Marital status: Married    Spouse name: N/A  . Number of children: 3  . Years of education: N/A   Occupational History  . retired    Social History Main Topics  . Smoking status: Never Smoker  . Smokeless tobacco: Never Used  . Alcohol use No  .  Drug use: No  . Sexual activity: Yes   Other Topics Concern  . None   Social History Narrative   Paige Arnold lives with husband and daughter.  Three grown children.   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     Paige Arnold took early retired at 55 because of problems of RSD.   Highest level of education:  Graduated high school      Past Surgical History:  Procedure Laterality Date  . AV FISTULA PLACEMENT Left 02/16/2015   Procedure: LEFT ARM BRACHIOCEPHALIC (AV) FISTULA CREATION;  Surgeon: Mal Misty, MD;  Location: [place];  Service: Vascular;  Laterality: Left;  . CHOLECYSTECTOMY    . INTRAOCULAR LENS INSERTION Right 10-21-13  . LIGATION OF COMPETING BRANCHES OF ARTERIOVENOUS FISTULA Left 02/16/2015   Procedure: LIGATION OF COMPETING BRANCHES OF LEFT BRACHIOCEPHALIC ARTERIOVENOUS FISTULA;  Surgeon: Mal Misty, MD;  Location: [place];  Service: Vascular;  Laterality: Left;  . PARTIAL HIP ARTHROPLASTY    . PARTIAL KNEE ARTHROPLASTY     left  . TEMPOROMANDIBULAR JOINT SURGERY    . TONSILLECTOMY    . TUBAL LIGATION     Past Medical History:  Diagnosis Date  . Arthritis   . Diabetes mellitus   . Diabetic neuropathy ([place])   . GERD (gastroesophageal  reflux disease)   . Hypertension   . Kidney disease   . Pneumonia Oct. 2016  . Shortness of breath dyspnea    when I first lay down, then it gets better  . Sleep apnea    does not wear CPAP   . Thyroid disorder    BP 139/85   Pulse 62   SpO2 94%   Opioid Risk Score:  3 Fall Risk Score:  `1  Depression screen PHQ 2/9  Depression screen [facility] 2/9 05/16/2017 03/19/2017 02/14/2017 11/13/2016 02/06/2016 12/07/2015 10/03/2015  Decreased Interest 0 0 0 0 0 0 3  Down, Depressed, Hopeless 0 0 0 0 0 0 3  PHQ - 2 Score 0 0 0 0 0 0 6  Altered sleeping - - - - - - -  Tired, decreased energy - - - - - - -  Change in appetite - - - - - - -  Feeling bad or failure about yourself  - - - - - - -  Trouble concentrating - - - - - - -  Moving  slowly or fidgety/restless - - - - - - -  Suicidal thoughts - - - - - - -  PHQ-9 Score - - - - - - -    Review of Systems  Constitutional: Negative.   HENT: Negative.   Eyes: Negative.   Respiratory: Negative.   Cardiovascular: Negative.   Gastrointestinal: Positive for constipation.  Endocrine: Negative.   Genitourinary: Negative.   Musculoskeletal: Negative.   Skin: Negative.   Allergic/Immunologic: Negative.   Neurological: Negative.   Hematological: Negative.   Psychiatric/Behavioral: Negative.   All other systems reviewed and are negative.      Objective:   Physical Exam  Constitutional: Paige Arnold is oriented to person, place, and time. Paige Arnold appears well-developed and well-nourished.  HENT:  Head: Normocephalic and atraumatic.  Neck: Normal range of motion. Neck supple.  Cardiovascular: Normal rate and regular rhythm.   Pulmonary/Chest: Effort normal and breath sounds normal.  Musculoskeletal:  Normal Muscle Bulk and Muscle Testing Reveals: Upper Extremities: Full ROM and Muscle Strength 5/5 Thoracic Paraspinal Tenderness: T-10-T-12 Lumbar Paraspinal tenderness: L-4-L-5 Lower Extremities: Right: Full ROM and Muscle Strength 5/5 Left: Decreased ROM: Left Lower Extremity Flexion Produces Pain into Patella Arises from Table slowly using straight cane for support Narrow Based gait  Neurological: Paige Arnold is alert and oriented to person, place, and time.  Skin: Skin is warm and dry.  Psychiatric: Paige Arnold has a normal mood and affect.  Nursing note and vitals reviewed.         Assessment & Plan:  1.Spinal stenosis chronic low back pain radiating into the legs:11/01//2018:  Continue:MSIR 15 mg take one tablet by mouththree times daily #90. We will continue the opioid monitoring program, this consists of regular clinic visits, examinations, urine drug screen, pill counts as well as use of [place] Controlled Substance Reporting System. 2. Diabetic Neuropathy: Continue  Gabapentin.05/16/2017 3. Left Knee Pain: S/P Peripheral Nerve Block received 6-8 hours of relief.Continue Current Medication and exercise Regimen. Paige Arnold refuses Cryoablation. ( RSD) Continue Lidoderm Patches.05/16/2017 4. Left Hip Fracture S/P hemiarthroplasty: Orthopedist Following. 05/16/2017  20 minutes of face to face patient care time was spent during this visit. All questions were encouraged and answered.  F/U in 1 month

## 2017-06-18 ENCOUNTER — Telehealth: Payer: Self-pay | Admitting: Registered Nurse

## 2017-06-18 ENCOUNTER — Other Ambulatory Visit: Payer: Self-pay

## 2017-06-18 ENCOUNTER — Encounter: Payer: Self-pay | Admitting: Registered Nurse

## 2017-06-18 ENCOUNTER — Encounter: Payer: Worker's Compensation | Attending: Registered Nurse | Admitting: Registered Nurse

## 2017-06-18 VITALS — BP 115/69 | HR 60

## 2017-06-18 DIAGNOSIS — G8929 Other chronic pain: Secondary | ICD-10-CM | POA: Insufficient documentation

## 2017-06-18 DIAGNOSIS — S72002A Fracture of unspecified part of neck of left femur, initial encounter for closed fracture: Secondary | ICD-10-CM | POA: Insufficient documentation

## 2017-06-18 DIAGNOSIS — I770 Arteriovenous fistula, acquired: Secondary | ICD-10-CM | POA: Insufficient documentation

## 2017-06-18 DIAGNOSIS — Z5181 Encounter for therapeutic drug level monitoring: Secondary | ICD-10-CM

## 2017-06-18 DIAGNOSIS — E079 Disorder of thyroid, unspecified: Secondary | ICD-10-CM | POA: Insufficient documentation

## 2017-06-18 DIAGNOSIS — I10 Essential (primary) hypertension: Secondary | ICD-10-CM | POA: Diagnosis not present

## 2017-06-18 DIAGNOSIS — M48061 Spinal stenosis, lumbar region without neurogenic claudication: Secondary | ICD-10-CM | POA: Diagnosis not present

## 2017-06-18 DIAGNOSIS — Z9889 Other specified postprocedural states: Secondary | ICD-10-CM | POA: Diagnosis not present

## 2017-06-18 DIAGNOSIS — Z96652 Presence of left artificial knee joint: Secondary | ICD-10-CM | POA: Diagnosis not present

## 2017-06-18 DIAGNOSIS — M545 Low back pain: Secondary | ICD-10-CM | POA: Insufficient documentation

## 2017-06-18 DIAGNOSIS — Z760 Encounter for issue of repeat prescription: Secondary | ICD-10-CM | POA: Insufficient documentation

## 2017-06-18 DIAGNOSIS — G4730 Sleep apnea, unspecified: Secondary | ICD-10-CM | POA: Insufficient documentation

## 2017-06-18 DIAGNOSIS — G894 Chronic pain syndrome: Secondary | ICD-10-CM | POA: Diagnosis not present

## 2017-06-18 DIAGNOSIS — K219 Gastro-esophageal reflux disease without esophagitis: Secondary | ICD-10-CM | POA: Insufficient documentation

## 2017-06-18 DIAGNOSIS — Z79899 Other long term (current) drug therapy: Secondary | ICD-10-CM

## 2017-06-18 DIAGNOSIS — G90522 Complex regional pain syndrome I of left lower limb: Secondary | ICD-10-CM

## 2017-06-18 DIAGNOSIS — M25562 Pain in left knee: Secondary | ICD-10-CM | POA: Diagnosis not present

## 2017-06-18 DIAGNOSIS — E1140 Type 2 diabetes mellitus with diabetic neuropathy, unspecified: Secondary | ICD-10-CM | POA: Diagnosis not present

## 2017-06-18 MED ORDER — MORPHINE SULFATE 15 MG PO TABS: 15.0000 mg | ORAL_TABLET | Freq: Three times a day (TID) | ORAL | 0 refills | Status: DC

## 2017-06-18 NOTE — Progress Notes (Signed)
Subjective:    Patient ID: Paige Arnold, female    DOB: [birth year], 76 y.o.   MRN: [id number]  HPI: [name] is a 76year old female who returns for follow up appointmentfor chronic pain and medication refill. She states her pain is located in her lower back and left knee. She rates her pain 4.Her current exercise regime is walking short distances with cane or walker and performing light house work.   [name] Morphine equivalent is 45.00 MME  Her last UDS was on 10/16/2016, it was consistent.   Pain Inventory Average Pain 4 Pain Right Now 4 My pain is sharp, burning, dull, stabbing, tingling and aching  In the last 24 hours, has pain interfered with the following? General activity 2 Relation with others 2 Enjoyment of life 6 What TIME of day is your pain at its worst? all Sleep (in general) Fair  Pain is worse with: walking, bending and standing Pain improves with: rest, heat/ice and medication Relief from Meds: 4  Mobility use a cane ability to climb steps?  yes do you drive?  no  Function disabled: date disabled . retired I need assistance with the following:  meal prep, household duties and shopping  Neuro/Psych No problems in this area  Prior Studies Any changes since last visit?  no  Physicians involved in your care Any changes since last visit?  no   Family History  Problem Relation Age of Onset  . Kidney disease Mother   . Heart disease Father   . Diabetes Brother   . Alcohol abuse Brother   . Depression Brother   . Sarcoidosis Brother   . ALS Brother        Deceased, 50s  . Heart disease Brother   . Bipolar disorder Daughter    Social History   Socioeconomic History  . Marital status: Married    Spouse name: None  . Number of children: 3  . Years of education: None  . Highest education level: None  Social Needs  . Financial resource strain: None  . Food insecurity - worry: None  . Food insecurity - inability: None  .  Transportation needs - medical: None  . Transportation needs - non-medical: None  Occupational History  . Occupation: retired  Tobacco Use  . Smoking status: Never Smoker  . Smokeless tobacco: Never Used  Substance and Sexual Activity  . Alcohol use: No    Alcohol/week: 0.0 oz  . Drug use: No  . Sexual activity: Yes  Other Topics Concern  . None  Social History Narrative   She lives with husband and daughter.  Three grown children.   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     She took early retired at 40 because of problems of RSD.   Highest level of education:  Graduated high school   Past Surgical History:  Procedure Laterality Date  . AV FISTULA PLACEMENT Left 02/16/2015   Procedure: LEFT ARM BRACHIOCEPHALIC (AV) FISTULA CREATION;  Surgeon: Mal Misty, MD;  Location: [place];  Service: Vascular;  Laterality: Left;  . CHOLECYSTECTOMY    . INTRAOCULAR LENS INSERTION Right 10-21-13  . LIGATION OF COMPETING BRANCHES OF ARTERIOVENOUS FISTULA Left 02/16/2015   Procedure: LIGATION OF COMPETING BRANCHES OF LEFT BRACHIOCEPHALIC ARTERIOVENOUS FISTULA;  Surgeon: Mal Misty, MD;  Location: [place];  Service: Vascular;  Laterality: Left;  . PARTIAL HIP ARTHROPLASTY    . PARTIAL KNEE ARTHROPLASTY  left  . TEMPOROMANDIBULAR JOINT SURGERY    . TONSILLECTOMY    . TUBAL LIGATION     Past Medical History:  Diagnosis Date  . Arthritis   . Diabetes mellitus   . Diabetic neuropathy ([place])   . GERD (gastroesophageal reflux disease)   . Hypertension   . Kidney disease   . Pneumonia Oct. 2016  . Shortness of breath dyspnea    when I first lay down, then it gets better  . Sleep apnea    does not wear CPAP   . Thyroid disorder    There were no vitals taken for this visit.  Opioid Risk Score:  3 Fall Risk Score:  `1  Depression screen PHQ 2/9  Depression screen [facility] 2/9 06/18/2017 05/16/2017 03/19/2017 02/14/2017 11/13/2016 02/06/2016 12/07/2015  Decreased Interest 0 0 0  0 0 0 0  Down, Depressed, Hopeless 0 0 0 0 0 0 0  PHQ - 2 Score 0 0 0 0 0 0 0  Altered sleeping - - - - - - -  Tired, decreased energy - - - - - - -  Change in appetite - - - - - - -  Feeling bad or failure about yourself  - - - - - - -  Trouble concentrating - - - - - - -  Moving slowly or fidgety/restless - - - - - - -  Suicidal thoughts - - - - - - -  PHQ-9 Score - - - - - - -    Review of Systems  Constitutional: Negative.   HENT: Negative.   Eyes: Negative.   Respiratory: Negative.   Cardiovascular: Negative.   Gastrointestinal: Positive for constipation.  Endocrine: Negative.   Genitourinary: Negative.   Musculoskeletal: Negative.   Skin: Negative.   Allergic/Immunologic: Negative.   Neurological: Negative.   Hematological: Negative.   Psychiatric/Behavioral: Negative.   All other systems reviewed and are negative.      Objective:   Physical Exam  Constitutional: She is oriented to person, place, and time. She appears well-developed and well-nourished.  HENT:  Head: Normocephalic and atraumatic.  Neck: Normal range of motion. Neck supple.  Cardiovascular: Normal rate and regular rhythm.  Pulmonary/Chest: Effort normal and breath sounds normal.  Musculoskeletal:  Normal Muscle Bulk and Muscle Testing Reveals: Upper Extremities: Full ROM and Muscle Strength 5/5 Lumbar Paraspinal Tenderness: L-3-L-5 Lower Extremities: Full ROM and Muscle Strength 5/5  Left Lower Extremity Flexion Produces Pain into Patella Arises from Table slowly using straight cane for support Narrow Based gait  Neurological: She is alert and oriented to person, place, and time.  Skin: Skin is warm and dry.  Psychiatric: She has a normal mood and affect.  Nursing note and vitals reviewed.         Assessment & Plan:  1.Spinal stenosis chronic low back pain radiating into the legs:12/04//2018:  Continue:MSIR 15 mg take one tablet by mouththree times daily #90. We will continue the  opioid monitoring program, this consists of regular clinic visits, examinations, urine drug screen, pill counts as well as use of [place] Controlled Substance Reporting System. 2. Diabetic Neuropathy: Continue Gabapentin.06/18/2017 3. Left Knee Pain: S/P Peripheral Nerve Block received 6-8 hours of relief.Continue Current Medication and exercise Regimen. Ms. Bachmeier refuses Cryoablation. ( RSD) Continue Lidoderm Patches.06/18/2017 4. Left Hip Fracture S/P hemiarthroplasty: Orthopedist Following. 06/18/2017  20 minutes of face to face patient care time was spent during this visit. All questions were encouraged and answered.  F/U in  1 month

## 2017-06-18 NOTE — Telephone Encounter (Signed)
On 06/18/2017 the [place] was reviewed no conflict was seen on the [place] with multiple prescribers. Paige Arnold has a signed narcotic contract with our office. If there were any discrepancies this would have been reported to her physician.

## 2017-07-19 ENCOUNTER — Encounter: Payer: Worker's Compensation | Attending: Registered Nurse | Admitting: Registered Nurse

## 2017-07-19 ENCOUNTER — Other Ambulatory Visit: Payer: Self-pay

## 2017-07-19 ENCOUNTER — Encounter: Payer: Self-pay | Admitting: Registered Nurse

## 2017-07-19 VITALS — BP 117/74 | HR 52

## 2017-07-19 DIAGNOSIS — Z79899 Other long term (current) drug therapy: Secondary | ICD-10-CM

## 2017-07-19 DIAGNOSIS — G894 Chronic pain syndrome: Secondary | ICD-10-CM | POA: Diagnosis not present

## 2017-07-19 DIAGNOSIS — E079 Disorder of thyroid, unspecified: Secondary | ICD-10-CM | POA: Diagnosis not present

## 2017-07-19 DIAGNOSIS — E1140 Type 2 diabetes mellitus with diabetic neuropathy, unspecified: Secondary | ICD-10-CM | POA: Insufficient documentation

## 2017-07-19 DIAGNOSIS — I10 Essential (primary) hypertension: Secondary | ICD-10-CM | POA: Insufficient documentation

## 2017-07-19 DIAGNOSIS — Z5181 Encounter for therapeutic drug level monitoring: Secondary | ICD-10-CM | POA: Diagnosis not present

## 2017-07-19 DIAGNOSIS — M79641 Pain in right hand: Secondary | ICD-10-CM | POA: Diagnosis not present

## 2017-07-19 DIAGNOSIS — K219 Gastro-esophageal reflux disease without esophagitis: Secondary | ICD-10-CM | POA: Insufficient documentation

## 2017-07-19 DIAGNOSIS — M4800 Spinal stenosis, site unspecified: Secondary | ICD-10-CM | POA: Insufficient documentation

## 2017-07-19 DIAGNOSIS — Z96652 Presence of left artificial knee joint: Secondary | ICD-10-CM | POA: Diagnosis not present

## 2017-07-19 DIAGNOSIS — M48061 Spinal stenosis, lumbar region without neurogenic claudication: Secondary | ICD-10-CM | POA: Diagnosis not present

## 2017-07-19 DIAGNOSIS — G90522 Complex regional pain syndrome I of left lower limb: Secondary | ICD-10-CM

## 2017-07-19 DIAGNOSIS — G4730 Sleep apnea, unspecified: Secondary | ICD-10-CM | POA: Insufficient documentation

## 2017-07-19 DIAGNOSIS — M25531 Pain in right wrist: Secondary | ICD-10-CM

## 2017-07-19 DIAGNOSIS — Z5189 Encounter for other specified aftercare: Secondary | ICD-10-CM | POA: Insufficient documentation

## 2017-07-19 DIAGNOSIS — Z760 Encounter for issue of repeat prescription: Secondary | ICD-10-CM | POA: Insufficient documentation

## 2017-07-19 DIAGNOSIS — G8929 Other chronic pain: Secondary | ICD-10-CM | POA: Insufficient documentation

## 2017-07-19 MED ORDER — MORPHINE SULFATE 15 MG PO TABS: 15.0000 mg | ORAL_TABLET | Freq: Three times a day (TID) | ORAL | 0 refills | Status: DC

## 2017-07-19 NOTE — Progress Notes (Signed)
Subjective:    Patient ID: Paige Arnold, female    DOB: May 22, 1941, 77 y.o.   MRN: [id number]  HPI: [name] is a 77year old female who returns for follow up appointmentfor chronic pain and medication refill. She states her pain is located in her right wrist, lower back and left knee. Paige Arnold refuses wrist X-ray and Medrol Dose Pak, states she will go to [facility] her Orthopedist today. She rates her pain 4.Her current exercise regime is walking short distances with cane or walker and performing light house work.   Paige Arnold Morphine equivalent is 45.00 MME  Her last UDS was on 10/16/2016, it was consistent.   Pain Inventory Average Pain 5 Pain Right Now 4 My pain is sharp, burning, dull, stabbing, tingling and aching  In the last 24 hours, has pain interfered with the following? General activity 2 Relation with others 2 Enjoyment of life 5 What TIME of day is your pain at its worst? all Sleep (in general) Fair  Pain is worse with: walking, bending and standing Pain improves with: rest and heat/ice Relief from Meds: 4  Mobility use a cane ability to climb steps?  no do you drive?  no  Function disabled: date disabled . retired I need assistance with the following:  meal prep, household duties and shopping  Neuro/Psych No problems in this area  Prior Studies Any changes since last visit?  no  Physicians involved in your care Any changes since last visit?  no   Family History  Problem Relation Age of Onset  . Kidney disease Mother   . Heart disease Father   . Diabetes Brother   . Alcohol abuse Brother   . Depression Brother   . Sarcoidosis Brother   . ALS Brother        Deceased, 63s  . Heart disease Brother   . Bipolar disorder Daughter    Social History   Socioeconomic History  . Marital status: Married    Spouse name: None  . Number of children: 3  . Years of education: None  . Highest education level: None  Social Needs  .  Financial resource strain: None  . Food insecurity - worry: None  . Food insecurity - inability: None  . Transportation needs - medical: None  . Transportation needs - non-medical: None  Occupational History  . Occupation: retired  Tobacco Use  . Smoking status: Never Smoker  . Smokeless tobacco: Never Used  Substance and Sexual Activity  . Alcohol use: No    Alcohol/week: 0.0 oz  . Drug use: No  . Sexual activity: Yes  Other Topics Concern  . None  Social History Narrative   She lives with husband and daughter.  Three grown children.   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   Highest level of education:  Graduated high school   Past Surgical History:  Procedure Laterality Date  . AV FISTULA PLACEMENT Left 02/16/2015   Procedure: LEFT ARM BRACHIOCEPHALIC (AV) FISTULA CREATION;  Surgeon: Mal Misty, MD;  Location: [place];  Service: Vascular;  Laterality: Left;  . CHOLECYSTECTOMY    . INTRAOCULAR LENS INSERTION Right 10-21-13  . LIGATION OF COMPETING BRANCHES OF ARTERIOVENOUS FISTULA Left 02/16/2015   Procedure: LIGATION OF COMPETING BRANCHES OF LEFT BRACHIOCEPHALIC ARTERIOVENOUS FISTULA;  Surgeon: Mal Misty, MD;  Location: [place];  Service:  Vascular;  Laterality: Left;  . PARTIAL HIP ARTHROPLASTY    . PARTIAL KNEE ARTHROPLASTY     left  . TEMPOROMANDIBULAR JOINT SURGERY    . TONSILLECTOMY    . TUBAL LIGATION     Past Medical History:  Diagnosis Date  . Arthritis   . Diabetes mellitus   . Diabetic neuropathy ([place])   . GERD (gastroesophageal reflux disease)   . Hypertension   . Kidney disease   . Pneumonia Oct. 2016  . Shortness of breath dyspnea    when I first lay down, then it gets better  . Sleep apnea    does not wear CPAP   . Thyroid disorder    There were no vitals taken for this visit.  Opioid Risk Score:  3 Fall Risk Score:  `1  Depression screen PHQ 2/9  Depression  screen [facility] 2/9 07/19/2017 06/18/2017 05/16/2017 03/19/2017 02/14/2017 11/13/2016 02/06/2016  Decreased Interest 0 0 0 0 0 0 0  Down, Depressed, Hopeless 0 0 0 0 0 0 0  PHQ - 2 Score 0 0 0 0 0 0 0  Altered sleeping - - - - - - -  Tired, decreased energy - - - - - - -  Change in appetite - - - - - - -  Feeling bad or failure about yourself  - - - - - - -  Trouble concentrating - - - - - - -  Moving slowly or fidgety/restless - - - - - - -  Suicidal thoughts - - - - - - -  PHQ-9 Score - - - - - - -    Review of Systems  Constitutional: Negative.   HENT: Negative.   Eyes: Negative.   Respiratory: Negative.   Cardiovascular: Negative.   Gastrointestinal: Positive for constipation.  Endocrine: Negative.   Genitourinary: Negative.   Musculoskeletal: Negative.   Skin: Negative.   Allergic/Immunologic: Negative.   Neurological: Negative.   Hematological: Negative.   Psychiatric/Behavioral: Negative.   All other systems reviewed and are negative.      Objective:   Physical Exam  Constitutional: She is oriented to person, place, and time. She appears well-developed and well-nourished.  HENT:  Head: Normocephalic and atraumatic.  Neck: Normal range of motion. Neck supple.  Cardiovascular: Normal rate and regular rhythm.  Pulmonary/Chest: Effort normal and breath sounds normal.  Musculoskeletal:  Normal Muscle Bulk and Muscle Testing Reveals: Upper Extremities: Right: Decreased ROM 90 Degrees and Muscle Strength 3/5  Left: Full ROM and Muscle Strength 4/5 Lumbar Paraspinal Tenderness: L-3-L-5 Lower Extremities: Full ROM and Muscle Strength 5/5  Left Lower Extremity Flexion Produces Pain into Patella Arises from Table slowly using straight cane for support Narrow Based gait  Neurological: She is alert and oriented to person, place, and time.  Skin: Skin is warm and dry.  Psychiatric: She has a normal mood and affect.  Nursing note and vitals reviewed.         Assessment & Plan:    1.Spinal stenosis chronic low back pain radiating into the legs:01/04//2019:  Continue:MSIR 15 mg take one tablet by mouththree times daily #90. We will continue the opioid monitoring program, this consists of regular clinic visits, examinations, urine drug screen, pill counts as well as use of [place] Controlled Substance Reporting System. 2. Diabetic Neuropathy: Continue Gabapentin.07/19/2017 3. Left Knee Pain: S/P Peripheral Nerve Block received 6-8 hours of relief.Continue Current Medication and exercise Regimen. Ms. Salminen refuses Cryoablation. ( RSD) Continue Lidoderm Patches.07/19/2017 4.  Left Hip Fracture S/P hemiarthroplasty: Orthopedist Following. 07/19/2017 5. Right Wrist Pain: She refuses X-ray and Medrol Dose Pak. 07/19/2017  20 minutes of face to face patient care time was spent during this visit. All questions were encouraged and answered.  F/U in 1 month

## 2017-07-31 ENCOUNTER — Telehealth: Payer: Self-pay | Admitting: Neurology

## 2017-07-31 NOTE — Telephone Encounter (Signed)
The soonest appointment is 2/7 at 11am, as I will be out of the office.   Please also request PCP to fax their most recent notes.  Add to cancellation list.

## 2017-07-31 NOTE — Telephone Encounter (Signed)
[phone]

## 2017-07-31 NOTE — Telephone Encounter (Signed)
Pt's daughter Ivin Booty called and said the primary care doctor wants Dr Posey Pronto to see pt immediately, her medication is not working and her memory is getting worse CB# [phone]

## 2017-08-01 ENCOUNTER — Other Ambulatory Visit: Payer: Self-pay

## 2017-08-01 DIAGNOSIS — N183 Chronic kidney disease, stage 3 unspecified: Secondary | ICD-10-CM

## 2017-08-01 DIAGNOSIS — T82510A Breakdown (mechanical) of surgically created arteriovenous fistula, initial encounter: Secondary | ICD-10-CM

## 2017-08-19 ENCOUNTER — Encounter: Payer: Self-pay | Admitting: Registered Nurse

## 2017-08-19 ENCOUNTER — Other Ambulatory Visit: Payer: Self-pay

## 2017-08-19 ENCOUNTER — Encounter: Payer: Worker's Compensation | Attending: Registered Nurse | Admitting: Registered Nurse

## 2017-08-19 VITALS — BP 144/77 | HR 62

## 2017-08-19 DIAGNOSIS — E1149 Type 2 diabetes mellitus with other diabetic neurological complication: Secondary | ICD-10-CM

## 2017-08-19 DIAGNOSIS — M25562 Pain in left knee: Secondary | ICD-10-CM | POA: Insufficient documentation

## 2017-08-19 DIAGNOSIS — I10 Essential (primary) hypertension: Secondary | ICD-10-CM | POA: Insufficient documentation

## 2017-08-19 DIAGNOSIS — Z9851 Tubal ligation status: Secondary | ICD-10-CM | POA: Insufficient documentation

## 2017-08-19 DIAGNOSIS — Z9889 Other specified postprocedural states: Secondary | ICD-10-CM | POA: Insufficient documentation

## 2017-08-19 DIAGNOSIS — Z79899 Other long term (current) drug therapy: Secondary | ICD-10-CM

## 2017-08-19 DIAGNOSIS — G90522 Complex regional pain syndrome I of left lower limb: Secondary | ICD-10-CM

## 2017-08-19 DIAGNOSIS — Z760 Encounter for issue of repeat prescription: Secondary | ICD-10-CM | POA: Diagnosis present

## 2017-08-19 DIAGNOSIS — Z5181 Encounter for therapeutic drug level monitoring: Secondary | ICD-10-CM

## 2017-08-19 DIAGNOSIS — M545 Low back pain: Secondary | ICD-10-CM | POA: Diagnosis not present

## 2017-08-19 DIAGNOSIS — Z96652 Presence of left artificial knee joint: Secondary | ICD-10-CM

## 2017-08-19 DIAGNOSIS — G8929 Other chronic pain: Secondary | ICD-10-CM | POA: Diagnosis present

## 2017-08-19 DIAGNOSIS — M48061 Spinal stenosis, lumbar region without neurogenic claudication: Secondary | ICD-10-CM

## 2017-08-19 DIAGNOSIS — E1140 Type 2 diabetes mellitus with diabetic neuropathy, unspecified: Secondary | ICD-10-CM | POA: Diagnosis not present

## 2017-08-19 DIAGNOSIS — G894 Chronic pain syndrome: Secondary | ICD-10-CM

## 2017-08-19 MED ORDER — MORPHINE SULFATE 15 MG PO TABS: 15.0000 mg | ORAL_TABLET | Freq: Three times a day (TID) | ORAL | 0 refills | Status: DC

## 2017-08-19 NOTE — Progress Notes (Signed)
Subjective:    Patient ID: Paige Arnold, female    DOB: 1941-02-05, 77 y.o.   MRN: [id number]  HPI: [name] is a 77year old female who returns for follow up appointmentfor chronic pain and medication refill. She states her pain is located in her lower back and left knee. She rates her pain 5.Her current exercise regime is walking short distances with cane or walker, stationary bike QOD for 15 minutes and performing light house work.   [name] Morphine equivalent is 45.00 MME  Her last UDS was on 10/16/2016, it was consistent. UDS ordered today.  Pain Inventory Average Pain 6 Pain Right Now 5 My pain is sharp, burning, dull, stabbing, tingling and aching  In the last 24 hours, has pain interfered with the following? General activity 2 Relation with others 2 Enjoyment of life 5 What TIME of day is your pain at its worst? all Sleep (in general) Fair  Pain is worse with: walking, bending and standing Pain improves with: rest and heat/ice Relief from Meds: 5  Mobility use a cane ability to climb steps?  no do you drive?  no  Function disabled: date disabled . retired I need assistance with the following:  meal prep, household duties and shopping  Neuro/Psych No problems in this area  Prior Studies Any changes since last visit?  no  Physicians involved in your care Any changes since last visit?  no   Family History  Problem Relation Age of Onset  . Kidney disease Mother   . Heart disease Father   . Diabetes Brother   . Alcohol abuse Brother   . Depression Brother   . Sarcoidosis Brother   . ALS Brother        Deceased, 67s  . Heart disease Brother   . Bipolar disorder Daughter    Social History   Socioeconomic History  . Marital status: Married    Spouse name: None  . Number of children: 3  . Years of education: None  . Highest education level: None  Social Needs  . Financial resource strain: None  . Food insecurity - worry: None  .  Food insecurity - inability: None  . Transportation needs - medical: None  . Transportation needs - non-medical: None  Occupational History  . Occupation: retired  Tobacco Use  . Smoking status: Never Smoker  . Smokeless tobacco: Never Used  Substance and Sexual Activity  . Alcohol use: No    Alcohol/week: 0.0 oz  . Drug use: No  . Sexual activity: Yes  Other Topics Concern  . None  Social History Narrative   She lives with husband and daughter.  Three grown children.   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     She took early retired at 42 because of problems of RSD.   Highest level of education:  Graduated high school   Past Surgical History:  Procedure Laterality Date  . AV FISTULA PLACEMENT Left 02/16/2015   Procedure: LEFT ARM BRACHIOCEPHALIC (AV) FISTULA CREATION;  Surgeon: Mal Misty, MD;  Location: [place];  Service: Vascular;  Laterality: Left;  . CHOLECYSTECTOMY    . INTRAOCULAR LENS INSERTION Right 10-21-13  . LIGATION OF COMPETING BRANCHES OF ARTERIOVENOUS FISTULA Left 02/16/2015   Procedure: LIGATION OF COMPETING BRANCHES OF LEFT BRACHIOCEPHALIC ARTERIOVENOUS FISTULA;  Surgeon: Mal Misty, MD;  Location: [place];  Service: Vascular;  Laterality: Left;  . PARTIAL HIP ARTHROPLASTY    .  PARTIAL KNEE ARTHROPLASTY     left  . TEMPOROMANDIBULAR JOINT SURGERY    . TONSILLECTOMY    . TUBAL LIGATION     Past Medical History:  Diagnosis Date  . Arthritis   . Diabetes mellitus   . Diabetic neuropathy ([place])   . GERD (gastroesophageal reflux disease)   . Hypertension   . Kidney disease   . Pneumonia Oct. 2016  . Shortness of breath dyspnea    when I first lay down, then it gets better  . Sleep apnea    does not wear CPAP   . Thyroid disorder    BP (!) 144/77   Pulse 62   SpO2 95%   Opioid Risk Score:  3 Fall Risk Score:  `1  Depression screen PHQ 2/9  Depression screen [facility] 2/9 08/19/2017 07/19/2017 06/18/2017 05/16/2017 03/19/2017 02/14/2017  11/13/2016  Decreased Interest 0 0 0 0 0 0 0  Down, Depressed, Hopeless 0 0 0 0 0 0 0  PHQ - 2 Score 0 0 0 0 0 0 0  Altered sleeping - - - - - - -  Tired, decreased energy - - - - - - -  Change in appetite - - - - - - -  Feeling bad or failure about yourself  - - - - - - -  Trouble concentrating - - - - - - -  Moving slowly or fidgety/restless - - - - - - -  Suicidal thoughts - - - - - - -  PHQ-9 Score - - - - - - -    Review of Systems  Constitutional: Negative.   HENT: Negative.   Eyes: Negative.   Respiratory: Negative.   Cardiovascular: Negative.   Gastrointestinal: Positive for constipation.  Endocrine: Negative.   Genitourinary: Negative.   Musculoskeletal: Negative.   Skin: Negative.   Allergic/Immunologic: Negative.   Neurological: Negative.   Hematological: Negative.   Psychiatric/Behavioral: Negative.   All other systems reviewed and are negative.      Objective:   Physical Exam  Constitutional: She is oriented to person, place, and time. She appears well-developed and well-nourished.  HENT:  Head: Normocephalic and atraumatic.  Neck: Normal range of motion. Neck supple.  Cardiovascular: Normal rate and regular rhythm.  Pulmonary/Chest: Effort normal and breath sounds normal.  Musculoskeletal:  Normal Muscle Bulk and Muscle Testing Reveals: Upper Extremities: Right: Full ROM and Muscle Strength 5/5  Lumbar Paraspinal Tenderness: L-4-L-5 Lower Extremities: Full ROM and Muscle Strength 5/5  Left Lower Extremity Flexion Produces Pain into Patella Arises from Table slowly using straight cane for support Narrow Based gait  Neurological: She is alert and oriented to person, place, and time.  Skin: Skin is warm and dry.  Psychiatric: She has a normal mood and affect.  Nursing note and vitals reviewed.         Assessment & Plan:  1.Spinal stenosis chronic low back pain radiating into the legs:01/04//2019:  Continue:MSIR 15 mg take one tablet by  mouththree times daily #90. We will continue the opioid monitoring program, this consists of regular clinic visits, examinations, urine drug screen, pill counts as well as use of [place] Controlled Substance Reporting System. 2. Diabetic Neuropathy: Continue Gabapentin.07/19/2017 3. Left Knee Pain: S/P Peripheral Nerve Block received 6-8 hours of relief.Continue Current Medication and exercise Regimen. Ms. Sandin refuses Cryoablation. ( RSD) Continue Lidoderm Patches.07/19/2017 4. Left Hip Fracture S/P hemiarthroplasty: Orthopedist Following. 07/19/2017 5. Right Wrist Pain: She refuses X-ray and Medrol Dose Pak.  07/19/2017  20 minutes of face to face patient care time was spent during this visit. All questions were encouraged and answered.  F/U in 1 month

## 2017-08-20 ENCOUNTER — Telehealth: Payer: Self-pay | Admitting: *Deleted

## 2017-08-20 NOTE — Telephone Encounter (Signed)
Urine drug screen for [facility] comp collected today and Fed Ex'd to chosen lab. Await results.

## 2017-08-22 ENCOUNTER — Other Ambulatory Visit (INDEPENDENT_AMBULATORY_CARE_PROVIDER_SITE_OTHER): Payer: Medicare Other

## 2017-08-22 ENCOUNTER — Ambulatory Visit: Payer: Medicare Other | Admitting: Neurology

## 2017-08-22 ENCOUNTER — Encounter: Payer: Self-pay | Admitting: Neurology

## 2017-08-22 VITALS — BP 120/70 | HR 65 | Ht 65.0 in | Wt 206.1 lb

## 2017-08-22 DIAGNOSIS — F32A Depression, unspecified: Secondary | ICD-10-CM

## 2017-08-22 DIAGNOSIS — R413 Other amnesia: Secondary | ICD-10-CM

## 2017-08-22 DIAGNOSIS — F329 Major depressive disorder, single episode, unspecified: Secondary | ICD-10-CM

## 2017-08-22 LAB — TSH: TSH: 3.27 u[IU]/mL (ref 0.35–4.50)

## 2017-08-22 LAB — VITAMIN B12

## 2017-08-22 NOTE — Progress Notes (Signed)
Follow-up Visit   Date: 08/22/17    Paige Arnold MRN: [id number] DOB: Jan 09, 1941   Interim History: Paige Arnold is a 77 y.o. right-handed Caucasian female with hypertension, diabetes mellitus, depression/anxiety, hypothyroidism, and chronic pain returning for follow-up of memory loss, neuropathy, and tremors. The patient was accompanied to the clinic by self.  History of present illness: She has neuropathy for the past two years, described throbbing.  She does not burning or tingling, but says that she cannot feel her feet.  The numbness extends to where her ankles are.  She endorses problems with balance and has been using a cane since her knee surgery in 2001.  She takes gabapentin 400mg  in the morning and 800mg  at bedtime.  She reports having previous NCS/EMG of her hands and fee (results unknown)t.  She also complains of memory problems for a number of years.  She does not drive or pay her own bills.  She does not cook, clean, or have any hobbies.  Her day is spent watching TV and sleeping. She does not exercise.  She takes aricept 10mg  daily. She feels that her memory is slowly worsening.  She does not keep up with the date or appointments.  She forgets peoples names.  She does not repeat herself.  She cannot multitask. She says that neuropsychological testing as well as imaging has been performed, but I do not have these records.  Starting in 2015, she developed tremors which as noticeable when she holds objects.  She feels like her whole body has an internal tremor, but does not actually see it.    She endorses feeling of anxiety and depressed mood.  She has seen psychiatry and counsellors in the past, but they didn't "click" with her so she stopped seeing them.  She says "I want someone to listen to me" and began crying.  No SI/HI.  Of note, she has RSD and lumbar stenosis and is on morphine 15mg  qh6 prn pain, followed by Dr. Letta Pate.  UPDATE 02/18/2017:  She is here for 1  year follow-up visit.  Unfortunately, she has no relief with neuropathic pain despite being on gabapentin 300mg  in the morning and 600mg  at bedtime.  She had CKD which prevents further titration. She also takes morphine 15mg  TID for her back pain which is prescribed by pain management and Cymbalta 30mg  daily. She denies any new or worsening tremors or memory changes.  She is unable to take care of IADLs such as cooking, cleaning, and managing medications due to her chronic pain.  UPDATE 08/22/2017:  Over the past several months, daughter has noticed progressive worsening of her memory such that she is forgets details of conversation and often repeats herself.  She is able to remember faces and names.  She takes her own medications and is not forgetting them.  She is not getting lost and there has been no changes in behavior, although daughter states that she is very sedentary.  She has [employer] for years and does not use it. She has no interest to engage socially or do any physical activity.  She enjoys watching TV all day and does not have any other hobbies.  She states that her mood is down and she can feel irritable.  Her family, PCP, and nephrologist have encouraged her to see a psychiatrist, but she has declined this.  Recent medication changes include increasing Cymbalta to 60mg /d and had reduced gabapentin 300mg  twice daily; she remains on morphine 15mg  TID  which is prescribed by pain management.    Medications:  Current Outpatient Medications on File Prior to Visit  Medication Sig Dispense Refill  . amLODipine (NORVASC) 5 MG tablet Take 5 mg by mouth daily. 1/2 tablet daily    . aspirin 81 MG tablet Take 81 mg by mouth daily.    . cloNIDine (CATAPRES) 0.1 MG tablet Take 0.1-0.2 mg by mouth 2 (two) times daily. 2 tablets in the morning, 1 tablet in the evening    . dicyclomine (BENTYL) 20 MG tablet Take 20 mg by mouth every 6 (six) hours as needed for spasms.    Marland Kitchen docusate sodium (COLACE) 100  MG capsule Take 100 mg by mouth 2 (two) times daily.    Marland Kitchen donepezil (ARICEPT) 10 MG tablet Take 1 tablet (10 mg total) by mouth at bedtime. 90 tablet 3  . DULoxetine (CYMBALTA) 30 MG capsule Take 60 mg by mouth daily.     . furosemide (LASIX) 80 MG tablet Take 80 mg by mouth.    . gabapentin (NEURONTIN) 300 MG capsule TAKE 1 CAPSULE BY MOUTH EVERY MORNING AND 2 CAPSULES EVERY EVENING 270 capsule 3  . glimepiride (AMARYL) 2 MG tablet Take 2 mg by mouth daily with breakfast.    . levothyroxine (SYNTHROID, LEVOTHROID) 125 MCG tablet Take 125 mcg by mouth daily before breakfast.    . lidocaine (LIDODERM) 5 % Place 1 patch onto the skin daily. Remove & Discard patch within 12 hours or as directed by MD 30 patch 1  . morphine (MSIR) 15 MG tablet Take 1 tablet (15 mg total) by mouth 3 (three) times daily. 90 tablet 0  . multivitamin-iron-minerals-folic acid (CENTRUM) chewable tablet Chew 1 tablet by mouth daily.    . Omega-3 Fatty Acids (FISH OIL) 1000 MG CAPS Take by mouth.    . pravastatin (PRAVACHOL) 40 MG tablet Take 40 mg by mouth daily.    . propranolol (INDERAL) 20 MG tablet Take 20 mg by mouth 2 (two) times daily.  1   No current facility-administered medications on file prior to visit.     Allergies:  Allergies  Allergen Reactions  . Furosemide     Other reaction(s): Malaise (intolerance)  . Sulfa Antibiotics Itching    Review of Systems:  CONSTITUTIONAL: No fevers, chills, night sweats, or weight loss.  EYES: No visual changes or eye pain ENT: No hearing changes.  No history of nose bleeds.   RESPIRATORY: No cough, wheezing and shortness of breath.   CARDIOVASCULAR: Negative for chest pain, and palpitations.   GI: Negative for abdominal discomfort, blood in stools or black stools.  No recent change in bowel habits.   GU:  No history of incontinence.   MUSCLOSKELETAL: ++history of joint pain or swelling.  +myalgias.   SKIN: Negative for lesions, rash, and itching.   ENDOCRINE:  Negative for cold or heat intolerance, polydipsia or goiter.   PSYCH:  + depression or anxiety symptoms.   NEURO: As Above.   Vital Signs:  BP 120/70   Pulse 65   Ht 5\' 5"  (1.651 m)   Wt 206 lb 2 oz (93.5 kg)   SpO2 98%   BMI 34.30 kg/m   Gen.: Well appearing, comfortable  Neurological Exam: MENTAL STATUS including orientation to time, place, person, recent and remote memory, attention span and concentration, language, and fund of knowledge is fair with very poor effort.  Speech is not dysarthric.  Montreal Cognitive Assessment  08/22/2017 01/06/2015  Visuospatial/ Executive (0/5) 4  5  Naming (0/3) 3 2  Attention: Read list of digits (0/2) 2 2  Attention: Read list of letters (0/1) 1 1  Attention: Serial 7 subtraction starting at 100 (0/3) 0 2  Language: Repeat phrase (0/2) 0 2  Language : Fluency (0/1) 0 0  Abstraction (0/2) 1 1  Delayed Recall (0/5) 0 0  Orientation (0/6) 5 5  Total 16 20  Adjusted Score (based on education) 17 21  *patient was giving adequate effort and performed well on the first half of the test, however when her daughter walked into the room during the second half of the test, her effort dramatically reduced.  CRANIAL NERVES:  Pupils are round and reactive to light. Extraocular muscles are intact. Face is symmetric. Tongue is midline.  MOTOR:  Motor strength is 5/5 in all extremities. No tremors.  COORDINATION/GAIT:  Gait is slow, antalgic and assisted with cane.  Data: Lab Results  Component Value Date   TSH 0.75 11/18/2014   Lab Results  Component Value Date   HGBA1C 6.3 (H) 03/09/2014     IMPRESSION/PLAN: Multifactorial cognitive impairment due to mood disorder, polypharmacy, and dementia syndrome. Her cognitive testing from today showed very poor effort, especially once her daughter entered the room, and significantly lost interest in completing the test accurately. Her recent worsening in memory seems to be stemming more from underlying  mood disorder, which she has refused treatment for in the past. I spent an extensive amount of time counseling the patient that she needs to see psychiatry, as escalating her dementia medications will not have any impact on her overall brain health, if mood disorder is the larger player. Daughter as well as her other providers also have stressed the importance of seeing psychiatry.  Patient was educated to stay mentally and physically active, to engage in hobbies that are more active than passive, as well as engage socially, but patient lacks interest in everything that I have suggested.  To be complete, I will check TSH and vitamin B 12 level.   I recommend staying on Aricept 10 mg daily.  Formal neuropsychological testing was offered, and patient will think about this.   Chronic pain due to low back pain, diabetic peripheral neuropathy, as well as other MSK pain is followed by pain management and takes gabapentin 300 mg twice daily, Cymbalta 60 mg daily, and morphine 15mg  TID.  Greater than 50% of this 30 minute visit was spent in counseling, explanation of diagnosis, planning of further management, and coordination of care.  Thank you for allowing me to participate in patient's care.  If I can answer any additional questions, I would be pleased to do so.    Sincerely,    Donika K. Posey Pronto, DO

## 2017-08-22 NOTE — Patient Instructions (Addendum)
Check labs  Follow-up with psychiatry

## 2017-08-23 ENCOUNTER — Telehealth: Payer: Self-pay | Admitting: *Deleted

## 2017-08-23 NOTE — Telephone Encounter (Signed)
-----   Message from Alda Berthold, DO sent at 08/22/2017  4:40 PM EST ----- Please notify patient lab are within normal limits - you may need to call the daughter to let her know.  Thank you.

## 2017-08-23 NOTE — Telephone Encounter (Signed)
Patient given results

## 2017-08-28 ENCOUNTER — Ambulatory Visit (HOSPITAL_COMMUNITY)
Admission: RE | Admit: 2017-08-28 | Discharge: 2017-08-28 | Disposition: A | Discharge status: Home / Self Care | Payer: Medicare Other | Source: Ambulatory Visit | Attending: Vascular Surgery | Admitting: Vascular Surgery

## 2017-08-28 ENCOUNTER — Other Ambulatory Visit: Payer: Self-pay

## 2017-08-28 ENCOUNTER — Encounter: Payer: Self-pay | Admitting: Vascular Surgery

## 2017-08-28 ENCOUNTER — Ambulatory Visit (INDEPENDENT_AMBULATORY_CARE_PROVIDER_SITE_OTHER): Payer: Self-pay | Admitting: Vascular Surgery

## 2017-08-28 VITALS — BP 125/64 | HR 58 | Temp 98.6°F | Resp 14 | Ht 65.0 in | Wt 202.0 lb

## 2017-08-28 DIAGNOSIS — T82510A Breakdown (mechanical) of surgically created arteriovenous fistula, initial encounter: Secondary | ICD-10-CM | POA: Insufficient documentation

## 2017-08-28 DIAGNOSIS — N183 Chronic kidney disease, stage 3 unspecified: Secondary | ICD-10-CM

## 2017-08-28 DIAGNOSIS — Y832 Surgical operation with anastomosis, bypass or graft as the cause of abnormal reaction of the patient, or of later complication, without mention of misadventure at the time of the procedure: Secondary | ICD-10-CM | POA: Diagnosis not present

## 2017-08-28 DIAGNOSIS — N184 Chronic kidney disease, stage 4 (severe): Secondary | ICD-10-CM

## 2017-08-28 NOTE — H&P (View-Only) (Signed)
Patient name: Paige Arnold MRN: [id number] DOB: Nov 02, 1940 Sex: female  REASON FOR VISIT:   To evaluate AV fistula.  The fistula is pulsatile.  The consult is requested by Dr. Jamal Maes.  HPI:   Paige Arnold is a pleasant 77 y.o. female who presents for evaluation of her left upper arm fistula.  This patient had a fistula created on 02/16/2015.   I have reviewed the records that were sent from the referring office.  It was noted that the fistula was pulsatile and it was felt that this should be evaluated.  The patient is not on dialysis.  She denies pain or paresthesias in her left arm.  She denies any recent uremic symptoms.  Specifically, she denies nausea, vomiting, fatigue, anorexia, or palpitations.  Past Medical History:  Diagnosis Date  . Arthritis   . Diabetes mellitus   . Diabetic neuropathy ([place])   . GERD (gastroesophageal reflux disease)   . Hypertension   . Kidney disease   . Pneumonia Oct. 2016  . Shortness of breath dyspnea    when I first lay down, then it gets better  . Sleep apnea    does not wear CPAP   . Thyroid disorder     Family History  Problem Relation Age of Onset  . Kidney disease Mother   . Heart disease Father   . Diabetes Brother   . Alcohol abuse Brother   . Depression Brother   . Sarcoidosis Brother   . ALS Brother        Deceased, 26s  . Heart disease Brother   . Bipolar disorder Daughter     SOCIAL HISTORY: Social History   Tobacco Use  . Smoking status: Never Smoker  . Smokeless tobacco: Never Used  Substance Use Topics  . Alcohol use: No    Alcohol/week: 0.0 oz    Allergies  Allergen Reactions  . Furosemide     Other reaction(s): Malaise (intolerance)  . Sulfa Antibiotics Itching    Current Outpatient Medications  Medication Sig Dispense Refill  . amLODipine (NORVASC) 5 MG tablet Take 5 mg by mouth daily. 1/2 tablet daily    . aspirin 81 MG tablet Take 81 mg by mouth daily.    . cloNIDine (CATAPRES) 0.1 MG tablet  Take 0.1-0.2 mg by mouth 2 (two) times daily. 2 tablets in the morning, 1 tablet in the evening    . dicyclomine (BENTYL) 10 MG capsule TAKE 1 CAPSULE BY MOUTH EVERY 6 HOURS AS NEEDED FOR LOWER ABDOMINAL DISCOMFORT  2  . dicyclomine (BENTYL) 20 MG tablet Take 20 mg by mouth every 6 (six) hours as needed for spasms.    Marland Kitchen docusate sodium (COLACE) 100 MG capsule Take 100 mg by mouth 2 (two) times daily.    Marland Kitchen donepezil (ARICEPT) 10 MG tablet Take 1 tablet (10 mg total) by mouth at bedtime. 90 tablet 3  . DULoxetine (CYMBALTA) 30 MG capsule Take 60 mg by mouth daily.     . furosemide (LASIX) 80 MG tablet Take 80 mg by mouth.    . gabapentin (NEURONTIN) 300 MG capsule TAKE 1 CAPSULE BY MOUTH EVERY MORNING AND 2 CAPSULES EVERY EVENING 270 capsule 3  . glimepiride (AMARYL) 2 MG tablet Take 2 mg by mouth daily with breakfast.    . levothyroxine (SYNTHROID, LEVOTHROID) 125 MCG tablet Take 125 mcg by mouth daily before breakfast.    . lidocaine (LIDODERM) 5 % Place 1 patch onto the skin daily. Remove &  Discard patch within 12 hours or as directed by MD 30 patch 1  . morphine (MSIR) 15 MG tablet Take 1 tablet (15 mg total) by mouth 3 (three) times daily. 90 tablet 0  . multivitamin-iron-minerals-folic acid (CENTRUM) chewable tablet Chew 1 tablet by mouth daily.    . Omega-3 Fatty Acids (FISH OIL) 1000 MG CAPS Take by mouth.    . pravastatin (PRAVACHOL) 40 MG tablet Take 40 mg by mouth daily.    . propranolol (INDERAL) 20 MG tablet Take 20 mg by mouth 2 (two) times daily.  1   No current facility-administered medications for this visit.     REVIEW OF SYSTEMS:  [X]  denotes positive finding, [ ]  denotes negative finding Cardiac  Comments:  Chest pain or chest pressure:    Shortness of breath upon exertion:    Short of breath when lying flat:    Irregular heart rhythm:        Vascular    Pain in calf, thigh, or hip brought on by ambulation:    Pain in feet at night that wakes you up from your sleep:      Blood clot in your veins:    Leg swelling:         Pulmonary    Oxygen at home:    Productive cough:     Wheezing:         Neurologic    Sudden weakness in arms or legs:     Sudden numbness in arms or legs:     Sudden onset of difficulty speaking or slurred speech:    Temporary loss of vision in one eye:     Problems with dizziness:         Gastrointestinal    Blood in stool:     Vomited blood:         Genitourinary    Burning when urinating:     Blood in urine:        Psychiatric    Major depression:         Hematologic    Bleeding problems:    Problems with blood clotting too easily:        Skin    Rashes or ulcers:        Constitutional    Fever or chills:     PHYSICAL EXAM:   Vitals:   08/28/17 1526  BP: 125/64  Pulse: (!) 58  Resp: 14  Temp: 98.6 F (37 C)  TempSrc: Oral  SpO2: 95%  Weight: 202 lb (91.6 kg)  Height: 5\' 5"  (1.651 m)    GENERAL: The patient is a well-nourished female, in no acute distress. The vital signs are documented above. CARDIAC: There is a regular rate and rhythm.  VASCULAR: Her fistula is pulsatile proximally and then the thrill is more difficult to follow distally. She has a palpable left radial pulse. PULMONARY: There is good air exchange bilaterally without wheezing or rales. NEUROLOGIC: No focal weakness or paresthesias are detected. SKIN: There are no ulcers or rashes noted. PSYCHIATRIC: The patient has a normal affect.  DATA:    DUPLEX OF AV FISTULA: I have independently interpreted the duplex of the left upper arm AV fistula.  The diameters of the fistula ranged from 0.2-0.6 cm.  There are elevated velocities in the distal upper arm.  MEDICAL ISSUES:   POORLY MATURING LEFT BRACHIOCEPHALIC AV FISTULA: This fistula is not adequate for access and it was placed in 2016.  Before giving up on  this fistula I think it would be worth proceeding with a fistulogram with limited contrast to see if perhaps the stenosis in the  proximal fistula can be addressed with venoplasty.  This has been scheduled for 09/20/2017.  We will make further recommendations pending these results.  [name] [facility] [phone]

## 2017-08-28 NOTE — Progress Notes (Signed)
Patient name: Paige Arnold MRN: [id number] DOB: Aug 14, 1940 Sex: female  REASON FOR VISIT:   To evaluate AV fistula.  The fistula is pulsatile.  The consult is requested by Dr. Jamal Maes.  HPI:   Paige Arnold is a pleasant 77 y.o. female who presents for evaluation of her left upper arm fistula.  This patient had a fistula created on 02/16/2015.   I have reviewed the records that were sent from the referring office.  It was noted that the fistula was pulsatile and it was felt that this should be evaluated.  The patient is not on dialysis.  She denies pain or paresthesias in her left arm.  She denies any recent uremic symptoms.  Specifically, she denies nausea, vomiting, fatigue, anorexia, or palpitations.  Past Medical History:  Diagnosis Date  . Arthritis   . Diabetes mellitus   . Diabetic neuropathy ( [place])   . GERD (gastroesophageal reflux disease)   . Hypertension   . Kidney disease   . Pneumonia Oct. 2016  . Shortness of breath dyspnea    when I first lay down, then it gets better  . Sleep apnea    does not wear CPAP   . Thyroid disorder     Family History  Problem Relation Age of Onset  . Kidney disease Mother   . Heart disease Father   . Diabetes Brother   . Alcohol abuse Brother   . Depression Brother   . Sarcoidosis Brother   . ALS Brother        Deceased, 32s  . Heart disease Brother   . Bipolar disorder Daughter     SOCIAL HISTORY: Social History   Tobacco Use  . Smoking status: Never Smoker  . Smokeless tobacco: Never Used  Substance Use Topics  . Alcohol use: No    Alcohol/week: 0.0 oz    Allergies  Allergen Reactions  . Furosemide     Other reaction(s): Malaise (intolerance)  . Sulfa Antibiotics Itching    Current Outpatient Medications  Medication Sig Dispense Refill  . amLODipine (NORVASC) 5 MG tablet Take 5 mg by mouth daily. 1/2 tablet daily    . aspirin 81 MG tablet Take 81 mg by mouth daily.    . cloNIDine (CATAPRES) 0.1 MG tablet  Take 0.1-0.2 mg by mouth 2 (two) times daily. 2 tablets in the morning, 1 tablet in the evening    . dicyclomine (BENTYL) 10 MG capsule TAKE 1 CAPSULE BY MOUTH EVERY 6 HOURS AS NEEDED FOR LOWER ABDOMINAL DISCOMFORT  2  . dicyclomine (BENTYL) 20 MG tablet Take 20 mg by mouth every 6 (six) hours as needed for spasms.    Marland Kitchen docusate sodium (COLACE) 100 MG capsule Take 100 mg by mouth 2 (two) times daily.    Marland Kitchen donepezil (ARICEPT) 10 MG tablet Take 1 tablet (10 mg total) by mouth at bedtime. 90 tablet 3  . DULoxetine (CYMBALTA) 30 MG capsule Take 60 mg by mouth daily.     . furosemide (LASIX) 80 MG tablet Take 80 mg by mouth.    . gabapentin (NEURONTIN) 300 MG capsule TAKE 1 CAPSULE BY MOUTH EVERY MORNING AND 2 CAPSULES EVERY EVENING 270 capsule 3  . glimepiride (AMARYL) 2 MG tablet Take 2 mg by mouth daily with breakfast.    . levothyroxine (SYNTHROID, LEVOTHROID) 125 MCG tablet Take 125 mcg by mouth daily before breakfast.    . lidocaine (LIDODERM) 5 % Place 1 patch onto the skin daily. Remove &  Discard patch within 12 hours or as directed by MD 30 patch 1  . morphine (MSIR) 15 MG tablet Take 1 tablet (15 mg total) by mouth 3 (three) times daily. 90 tablet 0  . multivitamin-iron-minerals-folic acid (CENTRUM) chewable tablet Chew 1 tablet by mouth daily.    . Omega-3 Fatty Acids (FISH OIL) 1000 MG CAPS Take by mouth.    . pravastatin (PRAVACHOL) 40 MG tablet Take 40 mg by mouth daily.    . propranolol (INDERAL) 20 MG tablet Take 20 mg by mouth 2 (two) times daily.  1   No current facility-administered medications for this visit.     REVIEW OF SYSTEMS:  [X]  denotes positive finding, [ ]  denotes negative finding Cardiac  Comments:  Chest pain or chest pressure:    Shortness of breath upon exertion:    Short of breath when lying flat:    Irregular heart rhythm:        Vascular    Pain in calf, thigh, or hip brought on by ambulation:    Pain in feet at night that wakes you up from your sleep:      Blood clot in your veins:    Leg swelling:         Pulmonary    Oxygen at home:    Productive cough:     Wheezing:         Neurologic    Sudden weakness in arms or legs:     Sudden numbness in arms or legs:     Sudden onset of difficulty speaking or slurred speech:    Temporary loss of vision in one eye:     Problems with dizziness:         Gastrointestinal    Blood in stool:     Vomited blood:         Genitourinary    Burning when urinating:     Blood in urine:        Psychiatric    Major depression:         Hematologic    Bleeding problems:    Problems with blood clotting too easily:        Skin    Rashes or ulcers:        Constitutional    Fever or chills:     PHYSICAL EXAM:   Vitals:   08/28/17 1526  BP: 125/64  Pulse: (!) 58  Resp: 14  Temp: 98.6 F (37 C)  TempSrc: Oral  SpO2: 95%  Weight: 202 lb (91.6 kg)  Height: 5\' 5"  (1.651 m)    GENERAL: The patient is a well-nourished female, in no acute distress. The vital signs are documented above. CARDIAC: There is a regular rate and rhythm.  VASCULAR: Her fistula is pulsatile proximally and then the thrill is more difficult to follow distally. She has a palpable left radial pulse. PULMONARY: There is good air exchange bilaterally without wheezing or rales. NEUROLOGIC: No focal weakness or paresthesias are detected. SKIN: There are no ulcers or rashes noted. PSYCHIATRIC: The patient has a normal affect.  DATA:    DUPLEX OF AV FISTULA: I have independently interpreted the duplex of the left upper arm AV fistula.  The diameters of the fistula ranged from 0.2-0.6 cm.  There are elevated velocities in the distal upper arm.  MEDICAL ISSUES:   POORLY MATURING LEFT BRACHIOCEPHALIC AV FISTULA: This fistula is not adequate for access and it was placed in 2016.  Before giving up on  this fistula I think it would be worth proceeding with a fistulogram with limited contrast to see if perhaps the stenosis in the  proximal fistula can be addressed with venoplasty.  This has been scheduled for 09/20/2017.  We will make further recommendations pending these results.  [name] [facility] [phone]

## 2017-09-04 ENCOUNTER — Other Ambulatory Visit: Payer: Self-pay | Admitting: *Deleted

## 2017-09-11 ENCOUNTER — Telehealth: Payer: Self-pay | Admitting: *Deleted

## 2017-09-11 ENCOUNTER — Encounter (HOSPITAL_BASED_OUTPATIENT_CLINIC_OR_DEPARTMENT_OTHER): Payer: Worker's Compensation | Admitting: Registered Nurse

## 2017-09-11 ENCOUNTER — Encounter: Payer: Self-pay | Admitting: Registered Nurse

## 2017-09-11 VITALS — BP 132/68 | HR 57 | Resp 14

## 2017-09-11 DIAGNOSIS — Z96652 Presence of left artificial knee joint: Secondary | ICD-10-CM

## 2017-09-11 DIAGNOSIS — G894 Chronic pain syndrome: Secondary | ICD-10-CM

## 2017-09-11 DIAGNOSIS — M48061 Spinal stenosis, lumbar region without neurogenic claudication: Secondary | ICD-10-CM | POA: Diagnosis not present

## 2017-09-11 DIAGNOSIS — G8929 Other chronic pain: Secondary | ICD-10-CM

## 2017-09-11 DIAGNOSIS — G90522 Complex regional pain syndrome I of left lower limb: Secondary | ICD-10-CM

## 2017-09-11 DIAGNOSIS — E1149 Type 2 diabetes mellitus with other diabetic neurological complication: Secondary | ICD-10-CM | POA: Diagnosis not present

## 2017-09-11 DIAGNOSIS — M25562 Pain in left knee: Secondary | ICD-10-CM | POA: Diagnosis not present

## 2017-09-11 DIAGNOSIS — E1140 Type 2 diabetes mellitus with diabetic neuropathy, unspecified: Secondary | ICD-10-CM | POA: Diagnosis not present

## 2017-09-11 DIAGNOSIS — Z5181 Encounter for therapeutic drug level monitoring: Secondary | ICD-10-CM | POA: Diagnosis not present

## 2017-09-11 DIAGNOSIS — Z79899 Other long term (current) drug therapy: Secondary | ICD-10-CM

## 2017-09-11 DIAGNOSIS — Z760 Encounter for issue of repeat prescription: Secondary | ICD-10-CM | POA: Diagnosis not present

## 2017-09-11 MED ORDER — MORPHINE SULFATE 15 MG PO TABS: 15.0000 mg | ORAL_TABLET | Freq: Three times a day (TID) | ORAL | 0 refills | Status: DC

## 2017-09-11 NOTE — Telephone Encounter (Addendum)
I spoke with [facility] and requested copy of UDS be faxed to our office  (done 08/29/17) Report received. Results consistent for prescribed medications. Results will be located under media tab.

## 2017-09-11 NOTE — Progress Notes (Signed)
Subjective:    Patient ID: Paige Arnold, female    DOB: February 25, 1941, 77 y.o.   MRN: [id number]  HPI: Paige Arnold is a 77year old female who returns for follow up appointmentfor chronic pain and medication refill. She states her pain is located in her lower back and left knee. She rates her pain 5. Her current exercise regime is walking, using her stationary bike QOD for 15 minutes and performing light house work.   Paige Arnold Morphine equivalent is 45.00 MME  Her last UDS was on 08/19/2017, it was consistent.   Pain Inventory Average Pain 5 Pain Right Now 5 My pain is sharp, burning, dull, stabbing, tingling and aching  In the last 24 hours, has pain interfered with the following? General activity 2 Relation with others 2 Enjoyment of life 6 What TIME of day is your pain at its worst? all Sleep (in general) Fair  Pain is worse with: walking, bending and standing Pain improves with: rest, heat/ice and medication Relief from Meds: 5  Mobility use a cane ability to climb steps?  no do you drive?  no  Function disabled: date disabled . retired I need assistance with the following:  meal prep, household duties and shopping  Neuro/Psych spasms dizziness  Prior Studies Any changes since last visit?  no  Physicians involved in your care Any changes since last visit?  no   Family History  Problem Relation Age of Onset  . Kidney disease Mother   . Heart disease Father   . Diabetes Brother   . Alcohol abuse Brother   . Depression Brother   . Sarcoidosis Brother   . ALS Brother        Deceased, 60s  . Heart disease Brother   . Bipolar disorder Daughter    Social History   Socioeconomic History  . Marital status: Married    Spouse name: None  . Number of children: 3  . Years of education: 50  . Highest education level: None  Social Needs  . Financial resource strain: None  . Food insecurity - worry: None  . Food insecurity - inability: None  .  Transportation needs - medical: None  . Transportation needs - non-medical: None  Occupational History  . Occupation: retired  Tobacco Use  . Smoking status: Never Smoker  . Smokeless tobacco: Never Used  Substance and Sexual Activity  . Alcohol use: No    Alcohol/week: 0.0 oz  . Drug use: No  . Sexual activity: Yes  Other Topics Concern  . None  Social History Narrative   She lives with husband and daughter.  Three grown children.   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     She took early retirement 23 because of problems of RSD.   Highest level of education:  Graduated high school   Past Surgical History:  Procedure Laterality Date  . AV FISTULA PLACEMENT Left 02/16/2015   Procedure: LEFT ARM BRACHIOCEPHALIC (AV) FISTULA CREATION;  Surgeon: Mal Misty, MD;  Location: [place];  Service: Vascular;  Laterality: Left;  . CHOLECYSTECTOMY    . INTRAOCULAR LENS INSERTION Right 10-21-13  . LIGATION OF COMPETING BRANCHES OF ARTERIOVENOUS FISTULA Left 02/16/2015   Procedure: LIGATION OF COMPETING BRANCHES OF LEFT BRACHIOCEPHALIC ARTERIOVENOUS FISTULA;  Surgeon: Mal Misty, MD;  Location: [place];  Service: Vascular;  Laterality: Left;  . PARTIAL HIP ARTHROPLASTY    . PARTIAL KNEE ARTHROPLASTY     left  .  TEMPOROMANDIBULAR JOINT SURGERY    . TONSILLECTOMY    . TUBAL LIGATION     Past Medical History:  Diagnosis Date  . Arthritis   . Diabetes mellitus   . Diabetic neuropathy ([place])   . GERD (gastroesophageal reflux disease)   . Hypertension   . Kidney disease   . Pneumonia Oct. 2016  . Shortness of breath dyspnea    when I first lay down, then it gets better  . Sleep apnea    does not wear CPAP   . Thyroid disorder    BP 132/68 (BP Location: Right Arm, Patient Position: Sitting, Cuff Size: Normal)   Pulse (!) 57   Resp 14   SpO2 (!) 89%   Opioid Risk Score:  3 Fall Risk Score:  `1  Depression screen PHQ 2/9  Depression screen [facility] 2/9 08/19/2017  07/19/2017 06/18/2017 05/16/2017 03/19/2017 02/14/2017 11/13/2016  Decreased Interest 0 0 0 0 0 0 0  Down, Depressed, Hopeless 0 0 0 0 0 0 0  PHQ - 2 Score 0 0 0 0 0 0 0  Altered sleeping - - - - - - -  Tired, decreased energy - - - - - - -  Change in appetite - - - - - - -  Feeling bad or failure about yourself  - - - - - - -  Trouble concentrating - - - - - - -  Moving slowly or fidgety/restless - - - - - - -  Suicidal thoughts - - - - - - -  PHQ-9 Score - - - - - - -    Review of Systems  Constitutional: Negative.   HENT: Negative.   Eyes: Negative.   Respiratory: Negative.   Cardiovascular: Negative.   Gastrointestinal: Positive for constipation.  Endocrine: Negative.        High blood sugar  Genitourinary: Negative.   Musculoskeletal: Positive for arthralgias and back pain.  Skin: Negative.   Allergic/Immunologic: Negative.   Neurological: Negative.   Hematological: Negative.   Psychiatric/Behavioral: Negative.   All other systems reviewed and are negative.      Objective:   Physical Exam  Constitutional: She is oriented to person, place, and time. She appears well-developed and well-nourished.  HENT:  Head: Normocephalic and atraumatic.  Neck: Normal range of motion. Neck supple.  Cardiovascular: Normal rate and regular rhythm.  Pulmonary/Chest: Effort normal and breath sounds normal.  Musculoskeletal:  Normal Muscle Bulk and Muscle Testing Reveals: Upper Extremities: Full ROM and Muscle Strength 5/5  Left AC Joint with Tenderness Lumbar Paraspinal Tenderness: L-4-L-5 Lower Extremities: Full ROM and Muscle Strength 5/5 Arises from Table slowly using straight cane for support Narrow Based gait  Neurological: She is alert and oriented to person, place, and time.  Skin: Skin is warm and dry.  Psychiatric: She has a normal mood and affect.  Nursing note and vitals reviewed.         Assessment & Plan:  1.Spinal stenosis chronic low back pain radiating into the  legs:02/27//2019:  Continue:MSIR 15 mg take one tablet by mouththree times daily #90. We will continue the opioid monitoring program, this consists of regular clinic visits, examinations, urine drug screen, pill counts as well as use of [place] Controlled Substance Reporting System. 2. Diabetic Neuropathy: Continue Gabapentin.09/11/2017 3. Left Knee Pain: S/P Peripheral Nerve Block received 6-8 hours of relief.Continue Current Medication and exercise Regimen. Ms. Carda refuses Cryoablation. ( RSD) Continue Lidoderm Patches.09/11/2017 4. Left Hip Fracture S/P hemiarthroplasty: Orthopedist  Following. 09/11/2017  20 minutes of face to face patient care time was spent during this visit. All questions were encouraged and answered.  F/U in 1 month

## 2017-09-20 ENCOUNTER — Encounter (HOSPITAL_COMMUNITY)
Admission: RE | Disposition: A | Discharge status: Home / Self Care | Payer: Self-pay | Source: Ambulatory Visit | Attending: Vascular Surgery

## 2017-09-20 ENCOUNTER — Encounter (HOSPITAL_COMMUNITY): Payer: Self-pay | Admitting: Vascular Surgery

## 2017-09-20 ENCOUNTER — Ambulatory Visit (HOSPITAL_COMMUNITY)
Admission: RE | Admit: 2017-09-20 | Discharge: 2017-09-20 | Disposition: A | Discharge status: Home / Self Care | Payer: Medicare Other | Source: Ambulatory Visit | Attending: Vascular Surgery | Admitting: Vascular Surgery

## 2017-09-20 DIAGNOSIS — Z7982 Long term (current) use of aspirin: Secondary | ICD-10-CM | POA: Insufficient documentation

## 2017-09-20 DIAGNOSIS — Z7984 Long term (current) use of oral hypoglycemic drugs: Secondary | ICD-10-CM | POA: Diagnosis not present

## 2017-09-20 DIAGNOSIS — Z882 Allergy status to sulfonamides status: Secondary | ICD-10-CM | POA: Diagnosis not present

## 2017-09-20 DIAGNOSIS — G4730 Sleep apnea, unspecified: Secondary | ICD-10-CM | POA: Diagnosis not present

## 2017-09-20 DIAGNOSIS — K219 Gastro-esophageal reflux disease without esophagitis: Secondary | ICD-10-CM | POA: Insufficient documentation

## 2017-09-20 DIAGNOSIS — E079 Disorder of thyroid, unspecified: Secondary | ICD-10-CM | POA: Diagnosis not present

## 2017-09-20 DIAGNOSIS — M1990 Unspecified osteoarthritis, unspecified site: Secondary | ICD-10-CM | POA: Diagnosis not present

## 2017-09-20 DIAGNOSIS — T82898A Other specified complication of vascular prosthetic devices, implants and grafts, initial encounter: Secondary | ICD-10-CM | POA: Diagnosis not present

## 2017-09-20 DIAGNOSIS — N185 Chronic kidney disease, stage 5: Secondary | ICD-10-CM | POA: Diagnosis not present

## 2017-09-20 DIAGNOSIS — I10 Essential (primary) hypertension: Secondary | ICD-10-CM | POA: Insufficient documentation

## 2017-09-20 DIAGNOSIS — Y832 Surgical operation with anastomosis, bypass or graft as the cause of abnormal reaction of the patient, or of later complication, without mention of misadventure at the time of the procedure: Secondary | ICD-10-CM | POA: Diagnosis not present

## 2017-09-20 DIAGNOSIS — T82858A Stenosis of vascular prosthetic devices, implants and grafts, initial encounter: Secondary | ICD-10-CM | POA: Diagnosis present

## 2017-09-20 DIAGNOSIS — E1140 Type 2 diabetes mellitus with diabetic neuropathy, unspecified: Secondary | ICD-10-CM | POA: Insufficient documentation

## 2017-09-20 HISTORY — PX: A/V FISTULAGRAM: CATH118298

## 2017-09-20 HISTORY — PX: PERIPHERAL VASCULAR BALLOON ANGIOPLASTY: CATH118281

## 2017-09-20 LAB — POCT I-STAT, CHEM 8
BUN: 43 mg/dL — AB (ref 6–20)
CALCIUM ION: 1.15 mmol/L (ref 1.15–1.40)
CHLORIDE: 94 mmol/L — AB (ref 101–111)
CREATININE: 3.1 mg/dL — AB (ref 0.44–1.00)
GLUCOSE: 269 mg/dL — AB (ref 65–99)
HCT: 39 % (ref 36.0–46.0)
Hemoglobin: 13.3 g/dL (ref 12.0–15.0)
Potassium: 4 mmol/L (ref 3.5–5.1)
Sodium: 138 mmol/L (ref 135–145)
TCO2: 32 mmol/L (ref 22–32)

## 2017-09-20 SURGERY — A/V FISTULAGRAM
Anesthesia: LOCAL

## 2017-09-20 MED ORDER — IODIXANOL 320 MG/ML IV SOLN
INTRAVENOUS | Status: DC | PRN
  Administered 2017-09-20: 10 mL

## 2017-09-20 MED ORDER — HEPARIN (PORCINE) IN NACL 2-0.9 UNIT/ML-% IJ SOLN
INTRAMUSCULAR | Status: AC | PRN
  Administered 2017-09-20: 500 mL

## 2017-09-20 MED ORDER — HEPARIN (PORCINE) IN NACL 2-0.9 UNIT/ML-% IJ SOLN
INTRAMUSCULAR | Status: AC
  Filled 2017-09-20: qty 500

## 2017-09-20 MED ORDER — FENTANYL CITRATE (PF) 100 MCG/2ML IJ SOLN
INTRAMUSCULAR | Status: DC | PRN
  Administered 2017-09-20 (×2): 50 ug via INTRAVENOUS

## 2017-09-20 MED ORDER — LIDOCAINE HCL 1 % IJ SOLN
INTRAMUSCULAR | Status: AC
  Filled 2017-09-20: qty 20

## 2017-09-20 MED ORDER — HEPARIN SODIUM (PORCINE) 1000 UNIT/ML IJ SOLN
INTRAMUSCULAR | Status: AC
  Filled 2017-09-20: qty 1

## 2017-09-20 MED ORDER — MIDAZOLAM HCL 2 MG/2ML IJ SOLN
INTRAMUSCULAR | Status: AC
  Filled 2017-09-20: qty 2

## 2017-09-20 MED ORDER — LIDOCAINE HCL (PF) 1 % IJ SOLN
INTRAMUSCULAR | Status: DC | PRN
  Administered 2017-09-20: 2 mL via INTRADERMAL

## 2017-09-20 MED ORDER — MIDAZOLAM HCL 2 MG/2ML IJ SOLN
INTRAMUSCULAR | Status: DC | PRN
  Administered 2017-09-20: 1 mg via INTRAVENOUS

## 2017-09-20 MED ORDER — HEPARIN SODIUM (PORCINE) 1000 UNIT/ML IJ SOLN
INTRAMUSCULAR | Status: DC | PRN
  Administered 2017-09-20: 7000 [IU] via INTRAVENOUS

## 2017-09-20 MED ORDER — SODIUM CHLORIDE 0.9% FLUSH: 3.0000 mL | Freq: Two times a day (BID) | INTRAVENOUS | Status: DC

## 2017-09-20 MED ORDER — FENTANYL CITRATE (PF) 100 MCG/2ML IJ SOLN
INTRAMUSCULAR | Status: AC
  Filled 2017-09-20: qty 2

## 2017-09-20 MED ORDER — SODIUM CHLORIDE 0.9% FLUSH: 3.0000 mL | INTRAVENOUS | Status: DC | PRN

## 2017-09-20 MED ORDER — SODIUM CHLORIDE 0.9 % IV SOLN: 250.0000 mL | INTRAVENOUS | Status: DC | PRN

## 2017-09-20 SURGICAL SUPPLY — 15 items
BALLN LUTONIX AV 6X60X75 (BALLOONS) ×3
BALLN MUSTANG 6.0X40 75 (BALLOONS) ×3
BALLOON LUTONIX AV 6X60X75 (BALLOONS) ×1 IMPLANT
BALLOON MUSTANG 6.0X40 75 (BALLOONS) IMPLANT
COVER DOME SNAP 22 D (MISCELLANEOUS) ×3 IMPLANT
COVER PRB 48X5XTLSCP FOLD TPE (BAG) ×2 IMPLANT
COVER PROBE 5X48 (BAG) ×6
KIT ENCORE 26 ADVANTAGE (KITS) ×1 IMPLANT
KIT MICROPUNCTURE NIT STIFF (SHEATH) ×1 IMPLANT
PROTECTION STATION PRESSURIZED (MISCELLANEOUS) ×3
SHEATH PINNACLE R/O II 6F 4CM (SHEATH) ×1 IMPLANT
STATION PROTECTION PRESSURIZED (MISCELLANEOUS) ×2 IMPLANT
TRAY PV CATH (CUSTOM PROCEDURE TRAY) ×3 IMPLANT
TUBING CIL FLEX 10 FLL-RA (TUBING) ×3 IMPLANT
WIRE BENTSON .035X145CM (WIRE) ×1 IMPLANT

## 2017-09-20 NOTE — Discharge Instructions (Signed)
**Note -identified via Obfuscation** AV Fistula Placement, Care After °Refer to this sheet in the next few weeks. These instructions provide you with information about caring for yourself after your procedure. Your health care provider may also give you more specific instructions. Your treatment has been planned according to current medical practices, but problems sometimes occur. Call your health care provider if you have any problems or questions after your procedure. °What can I expect after the procedure? °After your procedure, it is common to: °· Feel sore. °· Have numbness. °· Feel cold. °· Feel a vibration (thrill) over the fistula. ° °Follow these instructions at home: ° °Incision care °· Do not take baths or showers, swim, or use a hot tub until your health care provider approves. °· Keep the area around your cut from surgery (incision) clean and dry. °· Follow instructions from your health care provider about how to take care of your incision. Make sure you: °? Wash your hands with soap and water before you change your bandage (dressing). If soap and water are not available, use hand sanitizer. °? Change your dressing as told by your health care provider. °? Leave stitches (sutures) and clips in place. They may need to stay in place for 2 weeks or longer. °Fistula Care °· Check your fistula site every day to make sure the thrill feels the same. °· Check your fistula site every day for signs of infection. Watch for: °? Redness. °? Swelling. °? Discharge. °? Tenderness. °? Enlargement. °· Keep your arm raised (elevated) while you rest. °· Do not lift anything that is heavier than a gallon of milk with the arm that has the fistula. °· Do not lie down on your fistula arm. °· Do not let anyone draw blood or take a blood pressure reading on your fistula arm. °· Do not wear tight jewelry or clothing over your fistula arm. °General instructions °· Rest at home for a day or two. °· Gradually start doing your usual activities again. Ask your surgeon  when you can return to work or school. °· Take over-the-counter and prescription medicines only as told by your health care provider. °· Keep all follow-up visits as told by your health care provider. This is important. °Contact a health care provider if: °· You have chills or a fever. °· You have pain at your fistula site that is not going away. °· You have numbness or coldness at your fistula site that is not going away. °· You feel a decrease or a change in the thrill. °· You have swelling in your arm or hand. °· You have redness, swelling, discharge, tenderness, or enlargement at your fistula site. °Get help right away if: °· You are bleeding from your fistula site. °· You have chest pain. °· You have trouble breathing. °This information is not intended to replace advice given to you by your health care provider. Make sure you discuss any questions you have with your health care provider. °Document Released: 07/02/2005 Document Revised: 11/09/2015 Document Reviewed: 09/22/2014 °Elsevier Interactive Patient Education © 2018 [employer] ° °

## 2017-09-20 NOTE — Op Note (Signed)
   PATIENT: Paige Arnold      MRN: [id number] DOB: Nov 08, 1940    DATE OF PROCEDURE: 09/20/2017  INDICATIONS:    [name] is a 77 y.o. female who had a left brachiocephalic fistula placed in 2016.  She is not on dialysis.  She was noted to have pulsatility to her fistula and duplex suggested some stenosis in the proximal fistula.  She was set up for a fistulogram.  PROCEDURE:    1.  Ultrasound-guided access to right brachiocephalic AV fistula 2.  Conscious sedation 3.  Fistulogram left brachiocephalic AV fistula. 4.  Venoplasty left BC AVF with DCB  SURGEON: Judeth Cornfield. Scot Dock, MD, FACS  ANESTHESIA: local with sedation   EBL: Minimal  TECHNIQUE: Patient was brought to the peripheral vascular lab and was sedated. The period of conscious sedation was 36  minutes.  During that time period, I was present face-to-face 100% of the time.  The patient was administered 1 mg of Versed and 50 mcg of fentanyl. The patient's heart rate, blood pressure, and oxygen saturation were monitored by the nurse continuously during the procedure.   The left arm was prepped and draped in usual sterile fashion.  Under ultrasound guidance, after the skin was anesthetized, I cannulated the proximal fistula with a micropuncture needle and a micropuncture sheath was introduced over the wire.  A fistulogram was obtained which showed a tight 90% stenosis in the proximal fistula.  Distal to the catheter the vein was small although this could potentially represent spasm.  I exchange the micropuncture sheath for a short 6 French sheath which was advanced without difficulty.  I then selected initially a 6 mm x 4 cm Mustang balloon which was inflated to 24 atm for 1 minute.  I then went back with a 6 mm x 6 cm drug-coated balloon which was inflated to 10 atm for 2 minutes.  Completion films showed no residual stenosis.  A 4-0 Monocryl was placed around the cannulation site.  Pressure was held for hemostasis.  No immediate  complications were noted.  A total of 10 cc of contrast was used. My next patient  FINDINGS:   90% stenosis in a proximal fistula which was successfully addressed with venoplasty as described above.  Beyond that the fistula was patent.  In order to limit contrast I did not evaluate the central veins or the proximal anastomosis.  CLINICAL NOTE: I will arrange a follow-up duplex and office visit in 6 weeks.  [name], MD, FACS [facility]  DATE OF DICTATION:   09/20/2017

## 2017-09-20 NOTE — Interval H&P Note (Signed)
History and Physical Interval Note:  09/20/2017 7:22 AM  Paige Arnold  has presented today for surgery, with the diagnosis of poor flow  The various methods of treatment have been discussed with the patient and family. After consideration of risks, benefits and other options for treatment, the patient has consented to  Procedure(s): A/V FISTULAGRAM - left arm (N/A) as a surgical intervention .  The patient's history has been reviewed, patient examined, no change in status, stable for surgery.  I have reviewed the patient's chart and labs.  Questions were answered to the patient's satisfaction.     [name]

## 2017-09-23 ENCOUNTER — Telehealth: Payer: Self-pay | Admitting: Vascular Surgery

## 2017-09-23 NOTE — Telephone Encounter (Signed)
-----   Message from Mena Goes, RN sent at 09/20/2017 10:27 AM EST ----- Regarding: 6 weeks w/ CSD   ----- Message ----- From: Angelia Mould, MD Sent: 09/20/2017   8:36 AM To: V[facility] Charge Pool Subject: charge                                          PROCEDURE:   1.  Ultrasound-guided access to right brachiocephalic AV fistula 2.  Conscious sedation 3.  Fistulogram left brachiocephalic AV fistula. 4.  Venoplasty left BC AVF with DCB  SURGEON: Judeth Cornfield. Scot Dock, MD, FACS  She will need a follow-up duplex and office visit in 6 weeks.  Thank you.  This can be with me.

## 2017-09-23 NOTE — Telephone Encounter (Signed)
spoke to pt and confirmed  10/29/17 and 10/30/17 appts

## 2017-10-04 ENCOUNTER — Telehealth: Payer: Self-pay

## 2017-10-04 NOTE — Telephone Encounter (Signed)
Returned patient's call. She stated she had a suture from her incision site that was snagging on her clothes. She had trimmed it as close to the skin as she could. I assured her the suture should dissolve within the next week or so. She could cover up the site with a Band-Aid to prevent it from snagging on her clothes. She has a follow up appointment scheduled. To call us back if anymore issues.

## 2017-10-08 ENCOUNTER — Encounter: Payer: No Typology Code available for payment source | Attending: Registered Nurse

## 2017-10-08 ENCOUNTER — Ambulatory Visit (HOSPITAL_BASED_OUTPATIENT_CLINIC_OR_DEPARTMENT_OTHER): Payer: Worker's Compensation | Admitting: Physical Medicine & Rehabilitation

## 2017-10-08 ENCOUNTER — Other Ambulatory Visit: Payer: Self-pay

## 2017-10-08 ENCOUNTER — Encounter: Payer: Self-pay | Admitting: Physical Medicine & Rehabilitation

## 2017-10-08 DIAGNOSIS — K219 Gastro-esophageal reflux disease without esophagitis: Secondary | ICD-10-CM | POA: Diagnosis not present

## 2017-10-08 DIAGNOSIS — Z5181 Encounter for therapeutic drug level monitoring: Secondary | ICD-10-CM

## 2017-10-08 DIAGNOSIS — G4730 Sleep apnea, unspecified: Secondary | ICD-10-CM | POA: Diagnosis not present

## 2017-10-08 DIAGNOSIS — E079 Disorder of thyroid, unspecified: Secondary | ICD-10-CM | POA: Insufficient documentation

## 2017-10-08 DIAGNOSIS — M4800 Spinal stenosis, site unspecified: Secondary | ICD-10-CM | POA: Diagnosis not present

## 2017-10-08 DIAGNOSIS — I10 Essential (primary) hypertension: Secondary | ICD-10-CM | POA: Insufficient documentation

## 2017-10-08 DIAGNOSIS — Z760 Encounter for issue of repeat prescription: Secondary | ICD-10-CM | POA: Insufficient documentation

## 2017-10-08 DIAGNOSIS — Z5189 Encounter for other specified aftercare: Secondary | ICD-10-CM | POA: Insufficient documentation

## 2017-10-08 DIAGNOSIS — G8929 Other chronic pain: Secondary | ICD-10-CM | POA: Diagnosis not present

## 2017-10-08 DIAGNOSIS — E1140 Type 2 diabetes mellitus with diabetic neuropathy, unspecified: Secondary | ICD-10-CM | POA: Diagnosis not present

## 2017-10-08 DIAGNOSIS — Z79899 Other long term (current) drug therapy: Secondary | ICD-10-CM | POA: Diagnosis not present

## 2017-10-08 DIAGNOSIS — G894 Chronic pain syndrome: Secondary | ICD-10-CM | POA: Diagnosis not present

## 2017-10-08 MED ORDER — MORPHINE SULFATE 15 MG PO TABS: 15.0000 mg | ORAL_TABLET | Freq: Three times a day (TID) | ORAL | 0 refills | Status: DC

## 2017-10-08 NOTE — Progress Notes (Signed)
Subjective:    Patient ID: Paige Arnold, female    DOB: 14-May-1941, 77 y.o.   MRN: [id number]  HPI  77 year old female with history of lumbar degenerative disc with left L3 radiculopathy.  She is also had partial knee replacement on the left side with chronic knee pain since this 1999.  She had postoperative complex regional pain syndrome. Maintained on narcotic analgesic medications.  Pill counts have been consistent, no signs of misuse He did not have significant relief after left L3 transforaminal injection under fluoroscopic guidance Increasing pain related to diabetic neuropathy, sees neurology for this as well   Maximum dose of gabapentin limited by her renal function Patient became overly sedated 2015 when she was on methocarbamol, nortriptyline as well as 3 times daily gabapentin History of hip fracture secondary to fall and who, went to skilled nursing facility for 5 weeks. No significant improvement with low back pain after medial branch blocks 09/05/2015 L3-4-5 Pain Inventory Average Pain 5 Pain Right Now 5 My pain is sharp, burning, dull, stabbing, tingling and aching  In the last 24 hours, has pain interfered with the following? General activity 2 Relation with others 2 Enjoyment of life 5 What TIME of day is your pain at its worst? morning daytim evening night Sleep (in general) NA  Pain is worse with: walking and standing Pain improves with: rest, heat/ice and medication Relief from Meds: 5  Mobility walk without assistance use a cane ability to climb steps?  no  Function disabled: date disabled .  Neuro/Psych trouble walking  Prior Studies Any changes since last visit?  yes just got out of hosiptal 3 weeks ago for AV fistula  Physicians involved in your care Any changes since last visit?  no   Family History  Problem Relation Age of Onset  . Kidney disease Mother   . Heart disease Father   . Diabetes Brother   . Alcohol abuse Brother   . Depression  Brother   . Sarcoidosis Brother   . ALS Brother        Deceased, 43s  . Heart disease Brother   . Bipolar disorder Daughter    Social History   Socioeconomic History  . Marital status: Married    Spouse name: Not on file  . Number of children: 3  . Years of education: 14  . Highest education level: Not on file  Occupational History  . Occupation: retired  Scientific laboratory technician  . Financial resource strain: Not on file  . Food insecurity:    Worry: Not on file    Inability: Not on file  . Transportation needs:    Medical: Not on file    Non-medical: Not on file  Tobacco Use  . Smoking status: Never Smoker  . Smokeless tobacco: Never Used  Substance and Sexual Activity  . Alcohol use: No    Alcohol/week: 0.0 oz  . Drug use: No  . Sexual activity: Yes  Lifestyle  . Physical activity:    Days per week: Not on file    Minutes per session: Not on file  . Stress: Not on file  Relationships  . Social connections:    Talks on phone: Not on file    Gets together: Not on file    Attends religious service: Not on file    Active member of club or organization: Not on file    Attends meetings of clubs or organizations: Not on file    Relationship status: Not on file  Other Topics Concern  . Not on file  Social History Narrative   She lives with husband and daughter.  Three grown children.   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     She took early retirement 80 because of problems of RSD.   Highest level of education:  Graduated high school   Past Surgical History:  Procedure Laterality Date  . A/V FISTULAGRAM N/A 09/20/2017   Procedure: A/V FISTULAGRAM - left arm;  Surgeon: Angelia Mould, MD;  Location: [place] [facility];  Service: Cardiovascular;  Laterality: N/A;  . AV FISTULA PLACEMENT Left 02/16/2015   Procedure: LEFT ARM BRACHIOCEPHALIC (AV) FISTULA CREATION;  Surgeon: Mal Misty, MD;  Location: [place];  Service: Vascular;  Laterality: Left;    . CHOLECYSTECTOMY    . INTRAOCULAR LENS INSERTION Right 10-21-13  . LIGATION OF COMPETING BRANCHES OF ARTERIOVENOUS FISTULA Left 02/16/2015   Procedure: LIGATION OF COMPETING BRANCHES OF LEFT BRACHIOCEPHALIC ARTERIOVENOUS FISTULA;  Surgeon: Mal Misty, MD;  Location: [place];  Service: Vascular;  Laterality: Left;  . PARTIAL HIP ARTHROPLASTY    . PARTIAL KNEE ARTHROPLASTY     left  . PERIPHERAL VASCULAR BALLOON ANGIOPLASTY Left 09/20/2017   Procedure: PERIPHERAL VASCULAR BALLOON ANGIOPLASTY;  Surgeon: Angelia Mould, MD;  Location: [place] [facility];  Service: Cardiovascular;  Laterality: Left;  left fistulagram  . TEMPOROMANDIBULAR JOINT SURGERY    . TONSILLECTOMY    . TUBAL LIGATION     Past Medical History:  Diagnosis Date  . Arthritis   . Diabetes mellitus   . Diabetic neuropathy ([place])   . GERD (gastroesophageal reflux disease)   . Hypertension   . Kidney disease   . Pneumonia Oct. 2016  . Shortness of breath dyspnea    when I first lay down, then it gets better  . Sleep apnea    does not wear CPAP   . Thyroid disorder    BP (!) 152/71   Pulse (!) 28   Ht 5' 5.5" (1.664 m)   Wt 203 lb 6.4 oz (92.3 kg)   SpO2 (!) 84%   BMI 33.33 kg/m   Opioid Risk Score:   Fall Risk Score:  `1  Depression screen PHQ 2/9  Depression screen [facility] 2/9 10/08/2017 08/19/2017 07/19/2017 06/18/2017 05/16/2017 03/19/2017 02/14/2017  Decreased Interest - 0 0 0 0 0 0  Down, Depressed, Hopeless 0 0 0 0 0 0 0  PHQ - 2 Score 0 0 0 0 0 0 0  Altered sleeping - - - - - - -  Tired, decreased energy - - - - - - -  Change in appetite - - - - - - -  Feeling bad or failure about yourself  - - - - - - -  Trouble concentrating - - - - - - -  Moving slowly or fidgety/restless - - - - - - -  Suicidal thoughts - - - - - - -  PHQ-9 Score - - - - - - -      Review of Systems  Constitutional: Positive for unexpected weight change.  HENT: Negative.   Eyes: Negative.   Respiratory: Negative.    Cardiovascular: Negative.   Gastrointestinal: Negative.   Endocrine: Negative.   Genitourinary: Negative.   Musculoskeletal: Negative.   Skin: Negative.   Allergic/Immunologic: Negative.   Neurological: Negative.   Hematological: Negative.   Psychiatric/Behavioral: Negative.   All other systems reviewed and are negative.  Objective:   Physical Exam  Constitutional: She appears well-developed and well-nourished. No distress.  Skin: She is not diaphoretic.  Nursing note and vitals reviewed. General no acute distress mood and affect appropriate Motor strength is 5/5 bilateral hip flexor knee extensor ankle dorsiflexor Sensation reduced to pinprick right foot compared to left foot right foot spares the little toe. Gait is with a cane no evidence of toe drag or knee instability Skin shows no evidence of lesions on her feet bilaterally.  No erythema or swelling.        Assessment & Plan:  1.Spinal stenosis chronic low back pain radiating into the legs:02/27//2019:  Continue:MSIR 15 mg take one tablet by mouththree times daily #90. Indication for chronic opioid: Lumbar stenosis and painful neuropathy Medication and dose: MS Contin 15mg  TID # pills per month: 90 Last UDS date: 10/16/16 Opioid Treatment Agreement signed (Y/N): Y Opioid Treatment Agreement last reviewed with patient:   [facility] reviewed this encounter (include red flags):  10/08/17 2. Diabetic Neuropathy: Bilateral foot painContinue Gabapentin.09/11/2017 3. Left Knee Pain: S/P Peripheral Nerve Block received 6-8 hours of relief.Continue Current Medication and exercise Regimen. Ms. Manago refuses Cryoablation. ( RSD) Continue Lidoderm Patches.09/11/2017 4. Left Hip Fracture S/P hemiarthroplasty: Orthopedist Following. 09/11/2017

## 2017-10-16 ENCOUNTER — Other Ambulatory Visit: Payer: Self-pay

## 2017-10-16 DIAGNOSIS — N184 Chronic kidney disease, stage 4 (severe): Secondary | ICD-10-CM

## 2017-10-29 ENCOUNTER — Ambulatory Visit (HOSPITAL_COMMUNITY)
Admission: RE | Admit: 2017-10-29 | Discharge: 2017-10-29 | Disposition: A | Discharge status: Home / Self Care | Payer: Medicare Other | Source: Ambulatory Visit | Attending: Vascular Surgery | Admitting: Vascular Surgery

## 2017-10-29 DIAGNOSIS — N184 Chronic kidney disease, stage 4 (severe): Secondary | ICD-10-CM | POA: Insufficient documentation

## 2017-10-30 ENCOUNTER — Ambulatory Visit (INDEPENDENT_AMBULATORY_CARE_PROVIDER_SITE_OTHER): Payer: Medicare Other | Admitting: Vascular Surgery

## 2017-10-30 ENCOUNTER — Other Ambulatory Visit: Payer: Self-pay

## 2017-10-30 ENCOUNTER — Encounter: Payer: Self-pay | Admitting: Vascular Surgery

## 2017-10-30 VITALS — BP 131/72 | HR 78 | Temp 97.4°F | Resp 16 | Ht 65.5 in | Wt 200.0 lb

## 2017-10-30 DIAGNOSIS — N184 Chronic kidney disease, stage 4 (severe): Secondary | ICD-10-CM | POA: Diagnosis not present

## 2017-10-30 NOTE — Progress Notes (Signed)
Patient name: Paige Arnold MRN: [id number] DOB: [birth year] Sex: female  REASON FOR VISIT:   Follow-up of left brachiocephalic AV fistula  HPI:   Paige Arnold is a pleasant 77 y.o. female who had a left brachiocephalic fistula placed in 2016.  She is not on dialysis.  She was noted to have some pulsatility in her fistula and duplex suggested stenosis in the proximal fistula.  On 09/20/2017 she underwent a fistulogram and venoplasty of the left brachiocephalic AV fistula with a drug-coated balloon.  She returns for a follow-up visit.  She is not on dialysis.  She has no pain or paresthesias in her left arm.  Current Outpatient Medications  Medication Sig Dispense Refill  . acetaminophen (TYLENOL) 500 MG tablet Take 1,000 mg by mouth every 6 (six) hours as needed for moderate pain or headache.    . Alum Hydroxide-Mag Trisilicate (GAVISCON) 95-09.3 MG CHEW Chew 1 tablet by mouth daily as needed (heartburn).    Marland Kitchen aspirin 81 MG tablet Take 81 mg by mouth daily.    . chlorhexidine (PERIDEX) 0.12 % solution SWISH WITH 1/2OZ FOR 30SECS, THEN SPIT OUT TWICE DAILY  9  . cloNIDine (CATAPRES) 0.1 MG tablet Take 0.1 mg by mouth daily.     Marland Kitchen dicyclomine (BENTYL) 10 MG capsule Take 10 mg by mouth daily.    Marland Kitchen donepezil (ARICEPT) 10 MG tablet Take 1 tablet (10 mg total) by mouth at bedtime. 90 tablet 3  . Dulaglutide (TRULICITY) 1.5 OI/7.1IW SOPN Inject 1.5 mg into the skin every Sunday.    . DULoxetine (CYMBALTA) 60 MG capsule Take 60 mg by mouth daily.    . furosemide (LASIX) 80 MG tablet Take 80 mg by mouth 2 (two) times daily.     Marland Kitchen gabapentin (NEURONTIN) 300 MG capsule TAKE 1 CAPSULE BY MOUTH EVERY MORNING AND 2 CAPSULES EVERY EVENING (Patient taking differently: Take 300 mg by mouth 2 (two) times daily. ) 270 capsule 3  . glimepiride (AMARYL) 4 MG tablet Take 4 mg by mouth daily with breakfast.    . levothyroxine (SYNTHROID, LEVOTHROID) 125 MCG tablet Take 125 mcg by mouth daily before breakfast.    .  lidocaine (LIDODERM) 5 % Place 1 patch onto the skin daily. Remove & Discard patch within 12 hours or as directed by MD (Patient taking differently: Place 1 patch onto the skin daily as needed (pain). Remove & Discard patch within 12 hours or as directed by MD) 30 patch 1  . morphine (MSIR) 15 MG tablet Take 1 tablet (15 mg total) by mouth 3 (three) times daily. 90 tablet 0  . Multiple Vitamins-Minerals (CENTRUM SILVER PO) Take 1 tablet by mouth daily.    . Omega-3 Fatty Acids (FISH OIL) 1000 MG CAPS Take 1,000 mg by mouth 2 (two) times daily.     Marland Kitchen OVER THE COUNTER MEDICATION Take 1 tablet by mouth daily. NeuRx TF    . pravastatin (PRAVACHOL) 40 MG tablet Take 40 mg by mouth at bedtime.     . propranolol (INDERAL) 20 MG tablet Take 20 mg by mouth 2 (two) times daily.  1  . ranitidine (ZANTAC) 150 MG tablet Take 150 mg by mouth 2 (two) times daily.    . vitamin B-12 (CYANOCOBALAMIN) 1000 MCG tablet Take 1,000 mcg by mouth daily.     No current facility-administered medications for this visit.     REVIEW OF SYSTEMS:  [X]  denotes positive finding, [ ]  denotes negative finding Cardiac  Comments:  Chest pain or chest pressure:    Shortness of breath upon exertion:    Short of breath when lying flat:    Irregular heart rhythm:    Constitutional    Fever or chills:     PHYSICAL EXAM:   Vitals:   10/30/17 1130  BP: 131/72  Pulse: 78  Resp: 16  Temp: (!) 97.4 F (36.3 C)  TempSrc: Oral  SpO2: 96%  Weight: 200 lb (90.7 kg)  Height: 5' 5.5" (1.664 m)    GENERAL: The patient is a well-nourished female, in no acute distress. The vital signs are documented above. CARDIOVASCULAR: There is a regular rate and rhythm. PULMONARY: There is good air exchange bilaterally without wheezing or rales. The fistula has a reasonable thrill. She has a palpable left radial pulse.  DATA:   DUPLEX AV FISTULA: I reviewed the duplex of the AV fistula that was done yesterday.  The fistula is patent.   There are some elevated velocities noted in the distal upper arm and mid to distal upper arm.  FISTULOGRAM: I reviewed her fistulogram which showed a tight stenosis in the proximal fistula which was successfully addressed with venoplasty.  Above that the vein looked reasonable in size although I did not fully assess the central veins given that she is not on dialysis and we wanted to limit our contrast load.  MEDICAL ISSUES:   STAGE IV CHRONIC KIDNEY DISEASE: The fistula shows areas of increased velocities distally we may have to consider a follow-up fistulogram if this does not continue to enlarge.  I have ordered a follow-up duplex scan in 2 months and I will see her back at that time.  If the fistula does not continue to enlarge and I think we need to consider a fistulogram to further evaluate the central portion of the fistula.  [name] [facility] [phone]

## 2017-10-31 ENCOUNTER — Other Ambulatory Visit: Payer: Self-pay

## 2017-10-31 DIAGNOSIS — Z01818 Encounter for other preprocedural examination: Secondary | ICD-10-CM

## 2017-10-31 DIAGNOSIS — N184 Chronic kidney disease, stage 4 (severe): Secondary | ICD-10-CM

## 2017-10-31 DIAGNOSIS — T82510A Breakdown (mechanical) of surgically created arteriovenous fistula, initial encounter: Secondary | ICD-10-CM

## 2017-10-31 DIAGNOSIS — N183 Chronic kidney disease, stage 3 unspecified: Secondary | ICD-10-CM

## 2017-11-05 ENCOUNTER — Encounter: Payer: No Typology Code available for payment source | Attending: Registered Nurse | Admitting: Registered Nurse

## 2017-11-05 DIAGNOSIS — E1140 Type 2 diabetes mellitus with diabetic neuropathy, unspecified: Secondary | ICD-10-CM | POA: Insufficient documentation

## 2017-11-05 DIAGNOSIS — M25562 Pain in left knee: Secondary | ICD-10-CM | POA: Insufficient documentation

## 2017-11-05 DIAGNOSIS — Z760 Encounter for issue of repeat prescription: Secondary | ICD-10-CM | POA: Insufficient documentation

## 2017-11-05 DIAGNOSIS — G8929 Other chronic pain: Secondary | ICD-10-CM | POA: Insufficient documentation

## 2017-11-05 DIAGNOSIS — M545 Low back pain: Secondary | ICD-10-CM | POA: Insufficient documentation

## 2017-11-05 DIAGNOSIS — Z9889 Other specified postprocedural states: Secondary | ICD-10-CM | POA: Insufficient documentation

## 2017-11-05 DIAGNOSIS — I10 Essential (primary) hypertension: Secondary | ICD-10-CM | POA: Insufficient documentation

## 2017-11-05 DIAGNOSIS — Z9851 Tubal ligation status: Secondary | ICD-10-CM | POA: Insufficient documentation

## 2017-11-08 ENCOUNTER — Encounter: Payer: No Typology Code available for payment source | Attending: Registered Nurse | Admitting: Registered Nurse

## 2017-11-08 ENCOUNTER — Encounter: Payer: Self-pay | Admitting: Registered Nurse

## 2017-11-08 VITALS — BP 124/69 | HR 61 | Ht 65.0 in | Wt 200.0 lb

## 2017-11-08 DIAGNOSIS — M7062 Trochanteric bursitis, left hip: Secondary | ICD-10-CM | POA: Diagnosis not present

## 2017-11-08 DIAGNOSIS — M48061 Spinal stenosis, lumbar region without neurogenic claudication: Secondary | ICD-10-CM | POA: Insufficient documentation

## 2017-11-08 DIAGNOSIS — Z5181 Encounter for therapeutic drug level monitoring: Secondary | ICD-10-CM | POA: Insufficient documentation

## 2017-11-08 DIAGNOSIS — G894 Chronic pain syndrome: Secondary | ICD-10-CM

## 2017-11-08 DIAGNOSIS — G90522 Complex regional pain syndrome I of left lower limb: Secondary | ICD-10-CM | POA: Diagnosis not present

## 2017-11-08 DIAGNOSIS — M25562 Pain in left knee: Secondary | ICD-10-CM | POA: Insufficient documentation

## 2017-11-08 DIAGNOSIS — Z96652 Presence of left artificial knee joint: Secondary | ICD-10-CM | POA: Diagnosis not present

## 2017-11-08 DIAGNOSIS — Z9889 Other specified postprocedural states: Secondary | ICD-10-CM | POA: Insufficient documentation

## 2017-11-08 DIAGNOSIS — Z79899 Other long term (current) drug therapy: Secondary | ICD-10-CM | POA: Diagnosis not present

## 2017-11-08 DIAGNOSIS — M7061 Trochanteric bursitis, right hip: Secondary | ICD-10-CM | POA: Insufficient documentation

## 2017-11-08 DIAGNOSIS — E1140 Type 2 diabetes mellitus with diabetic neuropathy, unspecified: Secondary | ICD-10-CM | POA: Insufficient documentation

## 2017-11-08 DIAGNOSIS — M542 Cervicalgia: Secondary | ICD-10-CM | POA: Insufficient documentation

## 2017-11-08 MED ORDER — MORPHINE SULFATE 15 MG PO TABS: 15.0000 mg | ORAL_TABLET | Freq: Three times a day (TID) | ORAL | 0 refills | Status: DC

## 2017-11-08 NOTE — Progress Notes (Signed)
Subjective:    Patient ID: Paige Arnold, female    DOB: July 19, 1940, 77 y.o.   MRN: [id number]  HPI: [name] is a 77 year old female who is here for follow up appointment for chronic pain and medication refill. She states her pain is located in her neck, lower back, bilateral hips and left knee. She rates her pain 4. Her current exercise regime is walking.   [name] Morphine equivalent is 45.00 MME.  Last UDS was Performed on  08/19/2017, it was consistent.   Pain Inventory Average Pain 4 Pain Right Now 4 My pain is sharp, burning, dull, stabbing, tingling and aching  In the last 24 hours, has pain interfered with the following? General activity 2 Relation with others 2 Enjoyment of life 6 What TIME of day is your pain at its worst? all Sleep (in general) Fair  Pain is worse with: walking, bending, standing and some activites Pain improves with: rest, heat/ice and medication Relief from Meds: .  Mobility use a cane ability to climb steps?  no do you drive?  no  Function retired  Neuro/Psych weakness  Prior Studies Any changes since last visit?  no  Physicians involved in your care Any changes since last visit?  no   Family History  Problem Relation Age of Onset  . Kidney disease Mother   . Heart disease Father   . Diabetes Brother   . Alcohol abuse Brother   . Depression Brother   . Sarcoidosis Brother   . ALS Brother        Deceased, 60s  . Heart disease Brother   . Bipolar disorder Daughter    Social History   Socioeconomic History  . Marital status: Married    Spouse name: Not on file  . Number of children: 3  . Years of education: 75  . Highest education level: Not on file  Occupational History  . Occupation: retired  Scientific laboratory technician  . Financial resource strain: Not on file  . Food insecurity:    Worry: Not on file    Inability: Not on file  . Transportation needs:    Medical: Not on file    Non-medical: Not on file  Tobacco Use    . Smoking status: Never Smoker  . Smokeless tobacco: Never Used  Substance and Sexual Activity  . Alcohol use: No    Alcohol/week: 0.0 oz  . Drug use: No  . Sexual activity: Yes  Lifestyle  . Physical activity:    Days per week: Not on file    Minutes per session: Not on file  . Stress: Not on file  Relationships  . Social connections:    Talks on phone: Not on file    Gets together: Not on file    Attends religious service: Not on file    Active member of club or organization: Not on file    Attends meetings of clubs or organizations: Not on file    Relationship status: Not on file  Other Topics Concern  . Not on file  Social History Narrative   She lives with husband and daughter.  Three grown children.   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     She took early retirement 67 because of problems of RSD.   Highest level of education:  Graduated high school   Past Surgical History:  Procedure Laterality Date  . A/V FISTULAGRAM N/A 09/20/2017   Procedure: A/V FISTULAGRAM -  left arm;  Surgeon: Angelia Mould, MD;  Location: [place] [facility];  Service: Cardiovascular;  Laterality: N/A;  . AV FISTULA PLACEMENT Left 02/16/2015   Procedure: LEFT ARM BRACHIOCEPHALIC (AV) FISTULA CREATION;  Surgeon: Mal Misty, MD;  Location: [place];  Service: Vascular;  Laterality: Left;  . CHOLECYSTECTOMY    . INTRAOCULAR LENS INSERTION Right 10-21-13  . LIGATION OF COMPETING BRANCHES OF ARTERIOVENOUS FISTULA Left 02/16/2015   Procedure: LIGATION OF COMPETING BRANCHES OF LEFT BRACHIOCEPHALIC ARTERIOVENOUS FISTULA;  Surgeon: Mal Misty, MD;  Location: [place];  Service: Vascular;  Laterality: Left;  . PARTIAL HIP ARTHROPLASTY    . PARTIAL KNEE ARTHROPLASTY     left  . PERIPHERAL VASCULAR BALLOON ANGIOPLASTY Left 09/20/2017   Procedure: PERIPHERAL VASCULAR BALLOON ANGIOPLASTY;  Surgeon: Angelia Mould, MD;  Location: [place] [facility];  Service: Cardiovascular;   Laterality: Left;  left fistulagram  . TEMPOROMANDIBULAR JOINT SURGERY    . TONSILLECTOMY    . TUBAL LIGATION     Past Medical History:  Diagnosis Date  . Arthritis   . Diabetes mellitus   . Diabetic neuropathy ([place])   . GERD (gastroesophageal reflux disease)   . Hypertension   . Kidney disease   . Pneumonia Oct. 2016  . Shortness of breath dyspnea    when I first lay down, then it gets better  . Sleep apnea    does not wear CPAP   . Thyroid disorder    Pulse 61   Ht 5\' 5"  (1.651 m)   Wt 200 lb (90.7 kg)   SpO2 94%   BMI 33.28 kg/m   Opioid Risk Score:   Fall Risk Score:  `1  Depression screen PHQ 2/9  Depression screen [facility] 2/9 10/08/2017 08/19/2017 07/19/2017 06/18/2017 05/16/2017 03/19/2017 02/14/2017  Decreased Interest - 0 0 0 0 0 0  Down, Depressed, Hopeless 0 0 0 0 0 0 0  PHQ - 2 Score 0 0 0 0 0 0 0  Altered sleeping - - - - - - -  Tired, decreased energy - - - - - - -  Change in appetite - - - - - - -  Feeling bad or failure about yourself  - - - - - - -  Trouble concentrating - - - - - - -  Moving slowly or fidgety/restless - - - - - - -  Suicidal thoughts - - - - - - -  PHQ-9 Score - - - - - - -     Review of Systems  Constitutional: Positive for unexpected weight change.  HENT: Negative.   Eyes: Negative.   Respiratory: Negative.   Cardiovascular: Negative.   Gastrointestinal: Negative.   Endocrine: Negative.   Genitourinary: Negative.   Musculoskeletal: Positive for arthralgias, back pain, gait problem, joint swelling and myalgias.  Skin: Negative.   Allergic/Immunologic: Negative.   Hematological: Negative.   Psychiatric/Behavioral: Negative.   All other systems reviewed and are negative.      Objective:   Physical Exam  Constitutional: She is oriented to person, place, and time. She appears well-developed and well-nourished.  HENT:  Head: Normocephalic and atraumatic.  Neck: Normal range of motion. Neck supple.  Cervical Paraspinal Tenderness:  C-5-C-6  Cardiovascular: Normal rate.  Pulmonary/Chest: Effort normal and breath sounds normal.  Musculoskeletal:  Normal Muscle Bulk and Muscle Testing Reveals:  Upper Extremities: Full ROM and Muscle Strength 5/5 Left AC Joint Tenderness Thoracic Paraspinal Tenderness: T-7-T-9 Lumbar Paraspinal Tenderness: L-3-L-5  Lower Extremities: Full ROM and Muscle Strength 5/5 Arises from table slowly using straight cane for support Narrow Based Gait  Neurological: She is alert and oriented to person, place, and time.  Skin: Skin is warm and dry.  Nursing note and vitals reviewed.         Assessment & Plan:  1.Spinal stenosis chronic low back pain radiating into the legs:04/26//2019:  Continue current medication regimen:MSIR 15 mg take one tablet by mouththree times daily #90. We will continue the opioid monitoring program, this consists of regular clinic visits, examinations, urine drug screen, pill counts as well as use of [place] Controlled Substance Reporting System. 2. Diabetic Neuropathy: Continue current medication regime with  Gabapentin.11/08/2017 3. Left Knee Pain: S/P Peripheral Nerve Block received 6-8 hours of relief.Continue Current Medication and exercise Regimen. Ms. Hagerty refuses Cryoablation. ( RSD) Continue Lidoderm Patches.11/08/2017 4. Left Hip Fracture S/P hemiarthroplasty: Orthopedist Following. 11/08/2017  20 minutes of face to face patient care time was spent during this visit. All questions were encouraged and answered.  F/U in 1 month

## 2017-11-21 ENCOUNTER — Telehealth: Payer: Self-pay

## 2017-11-21 DIAGNOSIS — G894 Chronic pain syndrome: Secondary | ICD-10-CM

## 2017-11-21 DIAGNOSIS — Z5181 Encounter for therapeutic drug level monitoring: Secondary | ICD-10-CM

## 2017-11-21 DIAGNOSIS — Z79899 Other long term (current) drug therapy: Secondary | ICD-10-CM

## 2017-11-21 NOTE — Telephone Encounter (Signed)
Heather from [facility] called stating they did not have Morphine IR 15mg  in stock and pt wants med sent to [facility] in [place]

## 2017-11-22 MED ORDER — MORPHINE SULFATE 15 MG PO TABS: 15.0000 mg | ORAL_TABLET | Freq: Three times a day (TID) | ORAL | 0 refills | Status: DC

## 2017-11-22 NOTE — Telephone Encounter (Signed)
Placed [facility] to [facility], they don't have the [place] in stock. I asked them to remove the script. MSIR will be e-scribed to [facility] per daughter request.  Call was placed to Ms. Orf, she is aware of the above, she verbalizes understanding.  According to PMP Aware Website last prescription was filled on 10/23/2017.

## 2017-12-05 ENCOUNTER — Encounter: Payer: No Typology Code available for payment source | Attending: Registered Nurse | Admitting: Registered Nurse

## 2017-12-05 ENCOUNTER — Encounter: Payer: Self-pay | Admitting: Registered Nurse

## 2017-12-05 VITALS — BP 154/82 | Resp 14 | Ht 65.0 in | Wt 198.0 lb

## 2017-12-05 DIAGNOSIS — Z79899 Other long term (current) drug therapy: Secondary | ICD-10-CM | POA: Diagnosis not present

## 2017-12-05 DIAGNOSIS — E1140 Type 2 diabetes mellitus with diabetic neuropathy, unspecified: Secondary | ICD-10-CM | POA: Diagnosis not present

## 2017-12-05 DIAGNOSIS — M25562 Pain in left knee: Secondary | ICD-10-CM | POA: Insufficient documentation

## 2017-12-05 DIAGNOSIS — Z5181 Encounter for therapeutic drug level monitoring: Secondary | ICD-10-CM | POA: Diagnosis not present

## 2017-12-05 DIAGNOSIS — I10 Essential (primary) hypertension: Secondary | ICD-10-CM | POA: Diagnosis not present

## 2017-12-05 DIAGNOSIS — M545 Low back pain: Secondary | ICD-10-CM | POA: Diagnosis not present

## 2017-12-05 DIAGNOSIS — G8929 Other chronic pain: Secondary | ICD-10-CM | POA: Insufficient documentation

## 2017-12-05 DIAGNOSIS — Z9851 Tubal ligation status: Secondary | ICD-10-CM | POA: Diagnosis not present

## 2017-12-05 DIAGNOSIS — Z760 Encounter for issue of repeat prescription: Secondary | ICD-10-CM | POA: Diagnosis present

## 2017-12-05 DIAGNOSIS — G894 Chronic pain syndrome: Secondary | ICD-10-CM

## 2017-12-05 DIAGNOSIS — Z96652 Presence of left artificial knee joint: Secondary | ICD-10-CM | POA: Diagnosis not present

## 2017-12-05 DIAGNOSIS — M48061 Spinal stenosis, lumbar region without neurogenic claudication: Secondary | ICD-10-CM | POA: Diagnosis not present

## 2017-12-05 DIAGNOSIS — Z9889 Other specified postprocedural states: Secondary | ICD-10-CM | POA: Insufficient documentation

## 2017-12-05 DIAGNOSIS — G90522 Complex regional pain syndrome I of left lower limb: Secondary | ICD-10-CM | POA: Diagnosis not present

## 2017-12-05 MED ORDER — MORPHINE SULFATE 15 MG PO TABS: 15.0000 mg | ORAL_TABLET | Freq: Three times a day (TID) | ORAL | 0 refills | Status: DC

## 2017-12-05 NOTE — Progress Notes (Signed)
Subjective:    Patient ID: Paige Arnold, female    DOB: 07-28-1940, 77 y.o.   MRN: [id number]  HPI:  Paige Arnold  is a  77 year old  female who returns for follow up appointment for chronic pain and medication refill. She states her pain is located in her lower back and left knee.. She rates her  pain 4. Her current exercise regime is walking.   Paige Arnold Equivalent is 45.00 MME.  Last UDS was Performed on 08/29/2017, it was consistent.    Pain Inventory Average Pain 4 Pain Right Now 4 My pain is sharp, burning, dull, stabbing, tingling and aching  In the last 24 hours, has pain interfered with the following? General activity 2 Relation with others 2 Enjoyment of life 6 What TIME of day is your pain at its worst? all Sleep (in general) NA  Pain is worse with: walking and bending Pain improves with: rest, heat/ice and medication Relief from Meds: 4  Mobility walk with assistance use a cane ability to climb steps?  no do you drive?  no  Function retired I need assistance with the following:  meal prep, household duties and shopping  Neuro/Psych No problems in this area  Prior Studies Any changes since last visit?  no  Physicians involved in your care Any changes since last visit?  no   Family History  Problem Relation Age of Onset  . Kidney disease Mother   . Heart disease Father   . Diabetes Brother   . Alcohol abuse Brother   . Depression Brother   . Sarcoidosis Brother   . ALS Brother        Deceased, 59s  . Heart disease Brother   . Bipolar disorder Daughter    Social History   Socioeconomic History  . Marital status: Married    Spouse name: Not on file  . Number of children: 3  . Years of education: 15  . Highest education level: Not on file  Occupational History  . Occupation: retired  Scientific laboratory technician  . Financial resource strain: Not on file  . Food insecurity:    Worry: Not on file    Inability: Not on file  . Transportation  needs:    Medical: Not on file    Non-medical: Not on file  Tobacco Use  . Smoking status: Never Smoker  . Smokeless tobacco: Never Used  Substance and Sexual Activity  . Alcohol use: No    Alcohol/week: 0.0 oz  . Drug use: No  . Sexual activity: Yes  Lifestyle  . Physical activity:    Days per week: Not on file    Minutes per session: Not on file  . Stress: Not on file  Relationships  . Social connections:    Talks on phone: Not on file    Gets together: Not on file    Attends religious service: Not on file    Active member of club or organization: Not on file    Attends meetings of clubs or organizations: Not on file    Relationship status: Not on file  Other Topics Concern  . Not on file  Social History Narrative   She lives with husband and daughter.  Three grown children.   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     She took early retirement 89 because of problems of RSD.   Highest level of education:  Graduated high school   Past  Surgical History:  Procedure Laterality Date  . A/V FISTULAGRAM N/A 09/20/2017   Procedure: A/V FISTULAGRAM - left arm;  Surgeon: Angelia Mould, MD;  Location: [place] [facility];  Service: Cardiovascular;  Laterality: N/A;  . AV FISTULA PLACEMENT Left 02/16/2015   Procedure: LEFT ARM BRACHIOCEPHALIC (AV) FISTULA CREATION;  Surgeon: Mal Misty, MD;  Location: [place];  Service: Vascular;  Laterality: Left;  . CHOLECYSTECTOMY    . INTRAOCULAR LENS INSERTION Right 10-21-13  . LIGATION OF COMPETING BRANCHES OF ARTERIOVENOUS FISTULA Left 02/16/2015   Procedure: LIGATION OF COMPETING BRANCHES OF LEFT BRACHIOCEPHALIC ARTERIOVENOUS FISTULA;  Surgeon: Mal Misty, MD;  Location: [place];  Service: Vascular;  Laterality: Left;  . PARTIAL HIP ARTHROPLASTY    . PARTIAL KNEE ARTHROPLASTY     left  . PERIPHERAL VASCULAR BALLOON ANGIOPLASTY Left 09/20/2017   Procedure: PERIPHERAL VASCULAR BALLOON ANGIOPLASTY;  Surgeon: Angelia Mould, MD;  Location: [place] [facility];  Service: Cardiovascular;  Laterality: Left;  left fistulagram  . TEMPOROMANDIBULAR JOINT SURGERY    . TONSILLECTOMY    . TUBAL LIGATION     Past Medical History:  Diagnosis Date  . Arthritis   . Diabetes mellitus   . Diabetic neuropathy ([place])   . GERD (gastroesophageal reflux disease)   . Hypertension   . Kidney disease   . Pneumonia Oct. 2016  . Shortness of breath dyspnea    when I first lay down, then it gets better  . Sleep apnea    does not wear CPAP   . Thyroid disorder    BP (!) 154/82 (BP Location: Right Arm, Patient Position: Sitting, Cuff Size: Large)   Resp 14   Ht 5\' 5"  (1.651 m)   Wt 198 lb (89.8 kg)   SpO2 90%   BMI 32.95 kg/m   Opioid Risk Score:   Fall Risk Score:  `1  Depression screen PHQ 2/9  Depression screen [facility] 2/9 10/08/2017 08/19/2017 07/19/2017 06/18/2017 05/16/2017 03/19/2017 02/14/2017  Decreased Interest - 0 0 0 0 0 0  Down, Depressed, Hopeless 0 0 0 0 0 0 0  PHQ - 2 Score 0 0 0 0 0 0 0  Altered sleeping - - - - - - -  Tired, decreased energy - - - - - - -  Change in appetite - - - - - - -  Feeling bad or failure about yourself  - - - - - - -  Trouble concentrating - - - - - - -  Moving slowly or fidgety/restless - - - - - - -  Suicidal thoughts - - - - - - -  PHQ-9 Score - - - - - - -    Review of Systems  Constitutional: Negative.   HENT: Negative.   Eyes: Negative.   Respiratory: Negative.   Cardiovascular: Negative.   Gastrointestinal: Positive for constipation.  Endocrine:       High blood sugar  Genitourinary: Negative.   Musculoskeletal: Positive for arthralgias and back pain.  Skin: Negative.   Allergic/Immunologic: Negative.   Neurological: Negative.   Psychiatric/Behavioral: Negative.        Objective:   Physical Exam  Constitutional: She is oriented to person, place, and time. She appears well-developed and well-nourished.  HENT:  Head: Normocephalic and atraumatic.    Neck: Normal range of motion. Neck supple.  Cardiovascular: Normal rate and regular rhythm.  Pulmonary/Chest: Effort normal and breath sounds normal.  Musculoskeletal:  Normal Muscle Bulk and  Muscle Testing Reveals: Upper Extremities: Full ROM and Muscle Strength 5/5 Thoracic Paraspinal Tenderness: T-1-T-3 T-7-T-9 Lumbar Paraspinal Tenderness: L-3-L-5 Lower Extremities: Full ROM and Muscle Strength 5/5 Arises from Table Slowly using cane for support Narrow Based gait   Neurological: She is alert and oriented to person, place, and time.  Skin: Skin is warm and dry.  Psychiatric: She has a normal mood and affect.  Nursing note and vitals reviewed.         Assessment & Plan:  1.Spinal stenosis chronic low back pain radiating into the legs:05/23//2019:  Continue current medication regimen:MSIR 15 mg take one tablet by mouththree times daily #90. We will continue the opioid monitoring program, this consists of regular clinic visits, examinations, urine drug screen, pill counts as well as use of [place] Controlled Substance Reporting System. 2. Diabetic Neuropathy: Continue current medication regime with  Gabapentin.12/05/2017 3. Left Knee Pain: S/P Peripheral Nerve Block received 6-8 hours of relief.Continue Current Medication and exercise Regimen. Ms. Fennel refuses Cryoablation. ( RSD) Continue Lidoderm Patches.12/05/2017 4. Left Hip Fracture S/P hemiarthroplasty: Orthopedist Following.12/05/2017  20 minutes of face to face patient care time was spent during this visit. All questions were encouraged and answered.  F/U in 1 month

## 2018-01-08 ENCOUNTER — Ambulatory Visit (HOSPITAL_COMMUNITY)
Admission: RE | Admit: 2018-01-08 | Discharge: 2018-01-08 | Disposition: A | Discharge status: Home / Self Care | Payer: Medicare Other | Source: Ambulatory Visit | Attending: Vascular Surgery | Admitting: Vascular Surgery

## 2018-01-08 ENCOUNTER — Ambulatory Visit: Payer: Medicare Other | Admitting: Vascular Surgery

## 2018-01-08 ENCOUNTER — Encounter: Payer: Self-pay | Admitting: Vascular Surgery

## 2018-01-08 ENCOUNTER — Other Ambulatory Visit: Payer: Self-pay

## 2018-01-08 VITALS — BP 133/72 | HR 53 | Resp 18 | Ht 65.0 in | Wt 198.0 lb

## 2018-01-08 DIAGNOSIS — X58XXXA Exposure to other specified factors, initial encounter: Secondary | ICD-10-CM | POA: Diagnosis not present

## 2018-01-08 DIAGNOSIS — Z01818 Encounter for other preprocedural examination: Secondary | ICD-10-CM | POA: Diagnosis not present

## 2018-01-08 DIAGNOSIS — T82510A Breakdown (mechanical) of surgically created arteriovenous fistula, initial encounter: Secondary | ICD-10-CM

## 2018-01-08 DIAGNOSIS — N183 Chronic kidney disease, stage 3 unspecified: Secondary | ICD-10-CM

## 2018-01-08 DIAGNOSIS — N184 Chronic kidney disease, stage 4 (severe): Secondary | ICD-10-CM

## 2018-01-08 NOTE — Progress Notes (Signed)
Patient name: Paige Arnold MRN: [id number] DOB: Feb 23, 1941 Sex: female  REASON FOR VISIT:   Follow-up after left brachiocephalic fistula.  HPI:   Paige Arnold is a pleasant 77 y.o. female who had a left brachiocephalic fistula placed in 2016.  She is not on dialysis.  On 09/20/2017 she underwent a fistulogram with venoplasty of a stenosis within the fistula and also drug-coated balloon angioplasty.  On follow-up on 10/30/2017 there was some elevated velocities near the distal upper arm and mid to distal upper arm.  He comes in for a 25-month follow-up visit.  Since I saw her last, she denies any significant pain or paresthesias in her left arm.  She is not on dialysis.  She tells me that her renal function has been normal.  She denies any recent uremic symptoms.  Current Outpatient Medications  Medication Sig Dispense Refill  . acetaminophen (TYLENOL) 500 MG tablet Take 1,000 mg by mouth every 6 (six) hours as needed for moderate pain or headache.    . Alum Hydroxide-Mag Trisilicate (GAVISCON) 93-81.0 MG CHEW Chew 1 tablet by mouth daily as needed (heartburn).    Marland Kitchen aspirin 81 MG tablet Take 81 mg by mouth daily.    . chlorhexidine (PERIDEX) 0.12 % solution SWISH WITH 1/2OZ FOR 30SECS, THEN SPIT OUT TWICE DAILY  9  . cloNIDine (CATAPRES) 0.1 MG tablet Take 0.1 mg by mouth daily.     Marland Kitchen dicyclomine (BENTYL) 10 MG capsule Take 10 mg by mouth daily.    Marland Kitchen donepezil (ARICEPT) 10 MG tablet Take 1 tablet (10 mg total) by mouth at bedtime. 90 tablet 3  . Dulaglutide (TRULICITY) 1.5 FB/5.1WC SOPN Inject 1.5 mg into the skin every Sunday.    . DULoxetine (CYMBALTA) 60 MG capsule Take 60 mg by mouth daily.    . furosemide (LASIX) 80 MG tablet Take 80 mg by mouth 2 (two) times daily.     Marland Kitchen gabapentin (NEURONTIN) 300 MG capsule TAKE 1 CAPSULE BY MOUTH EVERY MORNING AND 2 CAPSULES EVERY EVENING (Patient taking differently: Take 300 mg by mouth 2 (two) times daily. ) 270 capsule 3  . glimepiride (AMARYL) 4 MG  tablet Take 4 mg by mouth daily with breakfast.    . levothyroxine (SYNTHROID, LEVOTHROID) 125 MCG tablet Take 125 mcg by mouth daily before breakfast.    . lidocaine (LIDODERM) 5 % Place 1 patch onto the skin daily. Remove & Discard patch within 12 hours or as directed by MD (Patient taking differently: Place 1 patch onto the skin daily as needed (pain). Remove & Discard patch within 12 hours or as directed by MD) 30 patch 1  . morphine (MSIR) 15 MG tablet Take 1 tablet (15 mg total) by mouth 3 (three) times daily. 90 tablet 0  . Multiple Vitamins-Minerals (CENTRUM SILVER PO) Take 1 tablet by mouth daily.    . Omega-3 Fatty Acids (FISH OIL) 1000 MG CAPS Take 1,000 mg by mouth 2 (two) times daily.     Marland Kitchen OVER THE COUNTER MEDICATION Take 1 tablet by mouth daily. NeuRx TF    . pravastatin (PRAVACHOL) 40 MG tablet Take 40 mg by mouth at bedtime.     . propranolol (INDERAL) 20 MG tablet Take 20 mg by mouth 2 (two) times daily.  1  . ranitidine (ZANTAC) 150 MG tablet Take 150 mg by mouth 2 (two) times daily.    . vitamin B-12 (CYANOCOBALAMIN) 1000 MCG tablet Take 1,000 mcg by mouth daily.  No current facility-administered medications for this visit.     REVIEW OF SYSTEMS:  [X]  denotes positive finding, [ ]  denotes negative finding Cardiac  Comments:  Chest pain or chest pressure:    Shortness of breath upon exertion:    Short of breath when lying flat:    Irregular heart rhythm:    Constitutional    Fever or chills:     PHYSICAL EXAM:   Vitals:   01/08/18 1250  BP: 133/72  Pulse: (!) 53  Resp: 18  SpO2: 95%  Weight: 198 lb (89.8 kg)  Height: 5\' 5"  (1.651 m)    GENERAL: The patient is a well-nourished female, in no acute distress. The vital signs are documented above. CARDIOVASCULAR: There is a regular rate and rhythm. PULMONARY: There is good air exchange bilaterally without wheezing or rales. Her fistula is pulsatile just above the antecubital level.  Above this there is a week  thrill which is difficult to palpate.  DATA:   DIALYSIS ACCESS DUPLEX: The diameters of her fistula ranged from 0.25 in the distal upper arm to 0.55 cm.  There are elevated velocities in the distal upper arm.   MEDICAL ISSUES:   STAGE IV CHRONIC KIDNEY DISEASE: Her fistula is not maturing adequately.  I have explained that I think if we do not try to address the stenosis in the proximal fistula that this will not be adequate for access.  I have recommended that we proceed with a fistulogram to try to address the proximal stenosis although even with this there is no guarantee that this fistula will be salvageable.  I explained that the alternative option would be to consider ligating this and placing a graft when she is closer to needing dialysis.  However I would favor trying to salvage the fistula if at all possible and proceeding with a fistulogram.  The patient and her husband obviously have some concerns about the cost of the procedure given that they are thick stent, I certainly understand this.  They would like to discuss this further  before deciding whether or not to proceed with a fistulogram.  [name] [facility] [phone]

## 2018-01-09 ENCOUNTER — Encounter: Payer: Self-pay | Admitting: Registered Nurse

## 2018-01-09 ENCOUNTER — Encounter: Payer: No Typology Code available for payment source | Attending: Registered Nurse | Admitting: Registered Nurse

## 2018-01-09 VITALS — BP 103/51 | HR 54 | Resp 14 | Ht 65.0 in | Wt 198.0 lb

## 2018-01-09 DIAGNOSIS — M545 Low back pain: Secondary | ICD-10-CM | POA: Insufficient documentation

## 2018-01-09 DIAGNOSIS — E1140 Type 2 diabetes mellitus with diabetic neuropathy, unspecified: Secondary | ICD-10-CM | POA: Insufficient documentation

## 2018-01-09 DIAGNOSIS — Z5181 Encounter for therapeutic drug level monitoring: Secondary | ICD-10-CM

## 2018-01-09 DIAGNOSIS — N289 Disorder of kidney and ureter, unspecified: Secondary | ICD-10-CM | POA: Insufficient documentation

## 2018-01-09 DIAGNOSIS — G90522 Complex regional pain syndrome I of left lower limb: Secondary | ICD-10-CM | POA: Diagnosis not present

## 2018-01-09 DIAGNOSIS — Z760 Encounter for issue of repeat prescription: Secondary | ICD-10-CM | POA: Diagnosis not present

## 2018-01-09 DIAGNOSIS — Z79899 Other long term (current) drug therapy: Secondary | ICD-10-CM

## 2018-01-09 DIAGNOSIS — I10 Essential (primary) hypertension: Secondary | ICD-10-CM | POA: Diagnosis not present

## 2018-01-09 DIAGNOSIS — Z8249 Family history of ischemic heart disease and other diseases of the circulatory system: Secondary | ICD-10-CM | POA: Insufficient documentation

## 2018-01-09 DIAGNOSIS — E079 Disorder of thyroid, unspecified: Secondary | ICD-10-CM | POA: Insufficient documentation

## 2018-01-09 DIAGNOSIS — Z818 Family history of other mental and behavioral disorders: Secondary | ICD-10-CM | POA: Insufficient documentation

## 2018-01-09 DIAGNOSIS — M4800 Spinal stenosis, site unspecified: Secondary | ICD-10-CM | POA: Insufficient documentation

## 2018-01-09 DIAGNOSIS — M25562 Pain in left knee: Secondary | ICD-10-CM | POA: Insufficient documentation

## 2018-01-09 DIAGNOSIS — K219 Gastro-esophageal reflux disease without esophagitis: Secondary | ICD-10-CM | POA: Diagnosis not present

## 2018-01-09 DIAGNOSIS — Z841 Family history of disorders of kidney and ureter: Secondary | ICD-10-CM | POA: Diagnosis not present

## 2018-01-09 DIAGNOSIS — M7062 Trochanteric bursitis, left hip: Secondary | ICD-10-CM | POA: Diagnosis not present

## 2018-01-09 DIAGNOSIS — G4730 Sleep apnea, unspecified: Secondary | ICD-10-CM | POA: Diagnosis not present

## 2018-01-09 DIAGNOSIS — X58XXXA Exposure to other specified factors, initial encounter: Secondary | ICD-10-CM | POA: Diagnosis not present

## 2018-01-09 DIAGNOSIS — Z833 Family history of diabetes mellitus: Secondary | ICD-10-CM | POA: Diagnosis not present

## 2018-01-09 DIAGNOSIS — S72002A Fracture of unspecified part of neck of left femur, initial encounter for closed fracture: Secondary | ICD-10-CM | POA: Insufficient documentation

## 2018-01-09 DIAGNOSIS — Z79890 Hormone replacement therapy (postmenopausal): Secondary | ICD-10-CM | POA: Insufficient documentation

## 2018-01-09 DIAGNOSIS — Z7982 Long term (current) use of aspirin: Secondary | ICD-10-CM | POA: Diagnosis not present

## 2018-01-09 DIAGNOSIS — G8929 Other chronic pain: Secondary | ICD-10-CM | POA: Diagnosis present

## 2018-01-09 DIAGNOSIS — Z9049 Acquired absence of other specified parts of digestive tract: Secondary | ICD-10-CM | POA: Insufficient documentation

## 2018-01-09 DIAGNOSIS — M48061 Spinal stenosis, lumbar region without neurogenic claudication: Secondary | ICD-10-CM | POA: Diagnosis not present

## 2018-01-09 DIAGNOSIS — G894 Chronic pain syndrome: Secondary | ICD-10-CM | POA: Diagnosis not present

## 2018-01-09 DIAGNOSIS — Z96652 Presence of left artificial knee joint: Secondary | ICD-10-CM

## 2018-01-09 DIAGNOSIS — Z96649 Presence of unspecified artificial hip joint: Secondary | ICD-10-CM | POA: Insufficient documentation

## 2018-01-09 MED ORDER — MORPHINE SULFATE 15 MG PO TABS: 15.0000 mg | ORAL_TABLET | Freq: Three times a day (TID) | ORAL | 0 refills | Status: DC

## 2018-01-09 NOTE — Progress Notes (Signed)
Subjective:    Patient ID: Paige Arnold, female    DOB: [birth year], 77 y.o.   MRN: [id number]  HPI: Paige Arnold is a 77 year old female who returns for follow up appointment for chronic pain and medication refill. She states her pain is located in her lower back, left hip and left knee. She rates her pain 5. Her current exercise regime is walking.   Ms. Borba Morphine Equivalent is 45.00 MME. Last UDS was Performed on 08/19/2017, it was consistent.    Pain Inventory Average Pain 5 Pain Right Now 5 My pain is constant, sharp, burning, dull, stabbing, tingling and aching  In the last 24 hours, has pain interfered with the following? General activity 2 Relation with others 2 Enjoyment of life 6 What TIME of day is your pain at its worst? all Sleep (in general) Fair  Pain is worse with: walking, bending and standing Pain improves with: rest, heat/ice and medication Relief from Meds: 4  Mobility walk with assistance use a cane ability to climb steps?  no do you drive?  no  Function retired I need assistance with the following:  meal prep, household duties and shopping  Neuro/Psych trouble walking  Prior Studies Any changes since last visit?  no  Physicians involved in your care Any changes since last visit?  no   Family History  Problem Relation Age of Onset  . Kidney disease Mother   . Heart disease Father   . Diabetes Brother   . Alcohol abuse Brother   . Depression Brother   . Sarcoidosis Brother   . ALS Brother        Deceased, 54s  . Heart disease Brother   . Bipolar disorder Daughter    Social History   Socioeconomic History  . Marital status: Married    Spouse name: Not on file  . Number of children: 3  . Years of education: 66  . Highest education level: Not on file  Occupational History  . Occupation: retired  Scientific laboratory technician  . Financial resource strain: Not on file  . Food insecurity:    Worry: Not on file    Inability: Not on file  .  Transportation needs:    Medical: Not on file    Non-medical: Not on file  Tobacco Use  . Smoking status: Never Smoker  . Smokeless tobacco: Never Used  Substance and Sexual Activity  . Alcohol use: No    Alcohol/week: 0.0 oz  . Drug use: No  . Sexual activity: Yes  Lifestyle  . Physical activity:    Days per week: Not on file    Minutes per session: Not on file  . Stress: Not on file  Relationships  . Social connections:    Talks on phone: Not on file    Gets together: Not on file    Attends religious service: Not on file    Active member of club or organization: Not on file    Attends meetings of clubs or organizations: Not on file    Relationship status: Not on file  Other Topics Concern  . Not on file  Social History Narrative   She lives with husband and daughter.  Three grown children.   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     She took early retirement 62 because of problems of RSD.   Highest level of education:  Graduated high school   Past Surgical History:  Procedure Laterality  Date  . A/V FISTULAGRAM N/A 09/20/2017   Procedure: A/V FISTULAGRAM - left arm;  Surgeon: Angelia Mould, MD;  Location: [place];  Service: Cardiovascular;  Laterality: N/A;  . AV FISTULA PLACEMENT Left 02/16/2015   Procedure: LEFT ARM BRACHIOCEPHALIC (AV) FISTULA CREATION;  Surgeon: Mal Misty, MD;  Location: [place];  Service: Vascular;  Laterality: Left;  . CHOLECYSTECTOMY    . INTRAOCULAR LENS INSERTION Right 10-21-13  . LIGATION OF COMPETING BRANCHES OF ARTERIOVENOUS FISTULA Left 02/16/2015   Procedure: LIGATION OF COMPETING BRANCHES OF LEFT BRACHIOCEPHALIC ARTERIOVENOUS FISTULA;  Surgeon: Mal Misty, MD;  Location: [place];  Service: Vascular;  Laterality: Left;  . PARTIAL HIP ARTHROPLASTY    . PARTIAL KNEE ARTHROPLASTY     left  . PERIPHERAL VASCULAR BALLOON ANGIOPLASTY Left 09/20/2017   Procedure: PERIPHERAL VASCULAR BALLOON ANGIOPLASTY;   Surgeon: Angelia Mould, MD;  Location: [facility] [facility];  Service: Cardiovascular;  Laterality: Left;  left fistulagram  . TEMPOROMANDIBULAR JOINT SURGERY    . TONSILLECTOMY    . TUBAL LIGATION     Past Medical History:  Diagnosis Date  . Arthritis   . Diabetes mellitus   . Diabetic neuropathy ([place])   . GERD (gastroesophageal reflux disease)   . Hypertension   . Kidney disease   . Pneumonia Oct. 2016  . Shortness of breath dyspnea    when I first lay down, then it gets better  . Sleep apnea    does not wear CPAP   . Thyroid disorder    BP (!) 103/51 (BP Location: Right Arm, Patient Position: Sitting, Cuff Size: Large)   Pulse (!) 53   Resp 14   Ht 5\' 5"  (1.651 m)   Wt 198 lb (89.8 kg)   SpO2 93%   BMI 32.95 kg/m   Opioid Risk Score:   Fall Risk Score:  `1  Depression screen PHQ 2/9  Depression screen [facility] 2/9 10/08/2017 08/19/2017 07/19/2017 06/18/2017 05/16/2017 03/19/2017 02/14/2017  Decreased Interest - 0 0 0 0 0 0  Down, Depressed, Hopeless 0 0 0 0 0 0 0  PHQ - 2 Score 0 0 0 0 0 0 0  Altered sleeping - - - - - - -  Tired, decreased energy - - - - - - -  Change in appetite - - - - - - -  Feeling bad or failure about yourself  - - - - - - -  Trouble concentrating - - - - - - -  Moving slowly or fidgety/restless - - - - - - -  Suicidal thoughts - - - - - - -  PHQ-9 Score - - - - - - -    Review of Systems  Constitutional: Negative.   HENT: Negative.   Eyes: Negative.   Respiratory: Negative.   Cardiovascular: Negative.   Gastrointestinal: Negative.   Endocrine: Negative.   Genitourinary: Negative.   Musculoskeletal: Positive for arthralgias and back pain.  Skin: Negative.   Allergic/Immunologic: Negative.   Neurological: Negative.   Hematological: Negative.   Psychiatric/Behavioral: Positive for sleep disturbance.  All other systems reviewed and are negative.      Objective:   Physical Exam  Constitutional: She is oriented to person, place, and  time. She appears well-developed.  HENT:  Head: Normocephalic and atraumatic.  Neck: Normal range of motion. Neck supple.  Cardiovascular: Normal rate and regular rhythm.  Pulmonary/Chest: Effort normal and breath sounds normal.  Musculoskeletal:  Normal Muscle  Bulk and Muscle Testing Reveals: Upper Extremities: Full ROM and Muscle Strength 5/5 Lumbar Paraspinal Tenderness: L-3-L-5 Lower Extremities: Full ROM and Muscle Strength 5/5 Arises from Table Slowly using cane for support Narrow Based Gait.  Neurological: She is alert and oriented to person, place, and time.  Skin: Skin is warm and dry.  Psychiatric: She has a normal mood and affect.  Nursing note and vitals reviewed.         Assessment & Plan:  1.Spinal stenosis chronic low back pain radiating into the legs:06/27//2019:  Continuecurrent medication regimen:MSIR 15 mg take one tablet by mouththree times daily #90. We will continue the opioid monitoring program, this consists of regular clinic visits, examinations, urine drug screen, pill counts as well as use of [place] Controlled Substance Reporting System. 2. Diabetic Neuropathy: Continuecurrent medication regime withGabapentin.01/09/2018 3. Left Knee Pain: S/P Peripheral Nerve Block received 6-8 hours of relief.Continue Current Medication and exercise Regimen. Ms. Chesnut refuses Cryoablation. ( RSD) Continue Lidoderm Patches.01/09/2018 4. Left Hip Fracture S/P hemiarthroplasty: Orthopedist Following.01/09/2018  20 minutes of face to face patient care time was spent during this visit. All questions were encouraged and answered.  F/U in 1 month

## 2018-02-07 ENCOUNTER — Encounter: Payer: No Typology Code available for payment source | Attending: Registered Nurse | Admitting: Registered Nurse

## 2018-02-07 ENCOUNTER — Encounter: Payer: Self-pay | Admitting: Registered Nurse

## 2018-02-07 VITALS — BP 103/63 | HR 66 | Ht 65.0 in | Wt 198.0 lb

## 2018-02-07 DIAGNOSIS — M25562 Pain in left knee: Secondary | ICD-10-CM | POA: Insufficient documentation

## 2018-02-07 DIAGNOSIS — Z833 Family history of diabetes mellitus: Secondary | ICD-10-CM | POA: Diagnosis not present

## 2018-02-07 DIAGNOSIS — Z818 Family history of other mental and behavioral disorders: Secondary | ICD-10-CM | POA: Insufficient documentation

## 2018-02-07 DIAGNOSIS — K219 Gastro-esophageal reflux disease without esophagitis: Secondary | ICD-10-CM | POA: Diagnosis not present

## 2018-02-07 DIAGNOSIS — I770 Arteriovenous fistula, acquired: Secondary | ICD-10-CM | POA: Diagnosis not present

## 2018-02-07 DIAGNOSIS — I10 Essential (primary) hypertension: Secondary | ICD-10-CM | POA: Diagnosis not present

## 2018-02-07 DIAGNOSIS — G90522 Complex regional pain syndrome I of left lower limb: Secondary | ICD-10-CM | POA: Diagnosis not present

## 2018-02-07 DIAGNOSIS — M545 Low back pain: Secondary | ICD-10-CM | POA: Diagnosis not present

## 2018-02-07 DIAGNOSIS — M4800 Spinal stenosis, site unspecified: Secondary | ICD-10-CM | POA: Diagnosis not present

## 2018-02-07 DIAGNOSIS — R531 Weakness: Secondary | ICD-10-CM

## 2018-02-07 DIAGNOSIS — E1140 Type 2 diabetes mellitus with diabetic neuropathy, unspecified: Secondary | ICD-10-CM | POA: Insufficient documentation

## 2018-02-07 DIAGNOSIS — Z96652 Presence of left artificial knee joint: Secondary | ICD-10-CM | POA: Diagnosis not present

## 2018-02-07 DIAGNOSIS — Z811 Family history of alcohol abuse and dependence: Secondary | ICD-10-CM | POA: Diagnosis not present

## 2018-02-07 DIAGNOSIS — Z8249 Family history of ischemic heart disease and other diseases of the circulatory system: Secondary | ICD-10-CM | POA: Insufficient documentation

## 2018-02-07 DIAGNOSIS — Z9889 Other specified postprocedural states: Secondary | ICD-10-CM | POA: Diagnosis not present

## 2018-02-07 DIAGNOSIS — Z659 Problem related to unspecified psychosocial circumstances: Secondary | ICD-10-CM

## 2018-02-07 DIAGNOSIS — Z9049 Acquired absence of other specified parts of digestive tract: Secondary | ICD-10-CM | POA: Insufficient documentation

## 2018-02-07 DIAGNOSIS — G8929 Other chronic pain: Secondary | ICD-10-CM | POA: Insufficient documentation

## 2018-02-07 DIAGNOSIS — E079 Disorder of thyroid, unspecified: Secondary | ICD-10-CM | POA: Diagnosis not present

## 2018-02-07 DIAGNOSIS — G4730 Sleep apnea, unspecified: Secondary | ICD-10-CM | POA: Insufficient documentation

## 2018-02-07 DIAGNOSIS — G894 Chronic pain syndrome: Secondary | ICD-10-CM | POA: Diagnosis not present

## 2018-02-07 DIAGNOSIS — Z79899 Other long term (current) drug therapy: Secondary | ICD-10-CM

## 2018-02-07 DIAGNOSIS — Z5181 Encounter for therapeutic drug level monitoring: Secondary | ICD-10-CM

## 2018-02-07 DIAGNOSIS — N289 Disorder of kidney and ureter, unspecified: Secondary | ICD-10-CM | POA: Diagnosis not present

## 2018-02-07 DIAGNOSIS — M48061 Spinal stenosis, lumbar region without neurogenic claudication: Secondary | ICD-10-CM | POA: Diagnosis not present

## 2018-02-07 DIAGNOSIS — I951 Orthostatic hypotension: Secondary | ICD-10-CM | POA: Diagnosis not present

## 2018-02-07 DIAGNOSIS — Z96642 Presence of left artificial hip joint: Secondary | ICD-10-CM | POA: Insufficient documentation

## 2018-02-07 DIAGNOSIS — Z609 Problem related to social environment, unspecified: Secondary | ICD-10-CM

## 2018-02-07 MED ORDER — MORPHINE SULFATE 15 MG PO TABS
15.0000 mg | ORAL_TABLET | Freq: Three times a day (TID) | ORAL | 0 refills | Status: DC
Start: 2018-02-07 — End: 2018-03-14

## 2018-02-07 NOTE — Progress Notes (Signed)
Subjective:    Patient ID: Paige Arnold, female    DOB: 02-Mar-1941, 77 y.o.   MRN: [id number]  HPI: [name] is a 77 year old female who returns for follow up appointment for chronic pain and medication refill. She arrived to office walking and holding onto her husband. Manager noticed patient was pale, she was placed in a wheelchair. [name] stated to the RN  she wasn't feeling well and her legs felt weak. Also admits she didn't eat breakfast and usually doesn't eat till noon and she is a diabetic. She reports she had her morphine with applesauce.  Vitals were obtained see flow sheet and orthostatic hypotension noted. She refused ED evaluation.   [name] lives with her husband and daughter and admits she is unable to cook meals, she and her and husband eat a lot of processed meals.  Also stated her daughter doesn't cook for them. Spoke with Mr. Hand in detail on the importance of [name] following her diabetic diet. Will place a Education officer, museum Referral to obtain assistance in the community, they are very appreciative to the above and accepting.   [name] instructed to follow up with her PCP and Nephrologist, also instructed to keep a blood pressure log, she verbalizes understanding.     Pain Inventory Average Pain 4 Pain Right Now 5 My pain is sharp, burning, dull, stabbing, tingling and aching  In the last 24 hours, has pain interfered with the following? General activity 2 Relation with others 2 Enjoyment of life 5 What TIME of day is your pain at its worst? all Sleep (in general) NA  Pain is worse with: walking, bending and standing Pain improves with: rest, heat/ice and medication Relief from Meds: 5  Mobility use a cane ability to climb steps?  no do you drive?  no  Function disabled: date disabled . retired I need assistance with the following:  meal prep, household duties and shopping  Neuro/Psych No problems in this area  Prior Studies Any changes  since last visit?  no  Physicians involved in your care Any changes since last visit?  no   Family History  Problem Relation Age of Onset  . Kidney disease Mother   . Heart disease Father   . Diabetes Brother   . Alcohol abuse Brother   . Depression Brother   . Sarcoidosis Brother   . ALS Brother        Deceased, 31s  . Heart disease Brother   . Bipolar disorder Daughter    Social History   Socioeconomic History  . Marital status: Married    Spouse name: Not on file  . Number of children: 3  . Years of education: 41  . Highest education level: Not on file  Occupational History  . Occupation: retired  Scientific laboratory technician  . Financial resource strain: Not on file  . Food insecurity:    Worry: Not on file    Inability: Not on file  . Transportation needs:    Medical: Not on file    Non-medical: Not on file  Tobacco Use  . Smoking status: Never Smoker  . Smokeless tobacco: Never Used  Substance and Sexual Activity  . Alcohol use: No    Alcohol/week: 0.0 oz  . Drug use: No  . Sexual activity: Yes  Lifestyle  . Physical activity:    Days per week: Not on file    Minutes per session: Not on file  .  Stress: Not on file  Relationships  . Social connections:    Talks on phone: Not on file    Gets together: Not on file    Attends religious service: Not on file    Active member of club or organization: Not on file    Attends meetings of clubs or organizations: Not on file    Relationship status: Not on file  Other Topics Concern  . Not on file  Social History Narrative   She lives with husband and daughter.  Three grown children.   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     She took early retirement 79 because of problems of RSD.   Highest level of education:  Graduated high school   Past Surgical History:  Procedure Laterality Date  . A/V FISTULAGRAM N/A 09/20/2017   Procedure: A/V FISTULAGRAM - left arm;  Surgeon: Angelia Mould, MD;   Location: [facility];  Service: Cardiovascular;  Laterality: N/A;  . AV FISTULA PLACEMENT Left 02/16/2015   Procedure: LEFT ARM BRACHIOCEPHALIC (AV) FISTULA CREATION;  Surgeon: Mal Misty, MD;  Location: [facility];  Service: Vascular;  Laterality: Left;  . CHOLECYSTECTOMY    . INTRAOCULAR LENS INSERTION Right 10-21-13  . LIGATION OF COMPETING BRANCHES OF ARTERIOVENOUS FISTULA Left 02/16/2015   Procedure: LIGATION OF COMPETING BRANCHES OF LEFT BRACHIOCEPHALIC ARTERIOVENOUS FISTULA;  Surgeon: Mal Misty, MD;  Location: [facility];  Service: Vascular;  Laterality: Left;  . PARTIAL HIP ARTHROPLASTY    . PARTIAL KNEE ARTHROPLASTY     left  . PERIPHERAL VASCULAR BALLOON ANGIOPLASTY Left 09/20/2017   Procedure: PERIPHERAL VASCULAR BALLOON ANGIOPLASTY;  Surgeon: Angelia Mould, MD;  Location: [facility];  Service: Cardiovascular;  Laterality: Left;  left fistulagram  . TEMPOROMANDIBULAR JOINT SURGERY    . TONSILLECTOMY    . TUBAL LIGATION     Past Medical History:  Diagnosis Date  . Arthritis   . Diabetes mellitus   . Diabetic neuropathy ([place])   . GERD (gastroesophageal reflux disease)   . Hypertension   . Kidney disease   . Pneumonia Oct. 2016  . Shortness of breath dyspnea    when I first lay down, then it gets better  . Sleep apnea    does not wear CPAP   . Thyroid disorder    BP (!) 147/74   Pulse 60   Ht 5\' 5"  (1.651 m) Comment: reported  Wt 198 lb (89.8 kg) Comment: reported  SpO2 92%   BMI 32.95 kg/m   Opioid Risk Score:   Fall Risk Score:  `1  Depression screen PHQ 2/9  Depression screen [facility] 2/9 02/07/2018 10/08/2017 08/19/2017 07/19/2017 06/18/2017 05/16/2017 03/19/2017  Decreased Interest 0 - 0 0 0 0 0  Down, Depressed, Hopeless 0 0 0 0 0 0 0  PHQ - 2 Score 0 0 0 0 0 0 0  Altered sleeping - - - - - - -  Tired, decreased energy - - - - - - -  Change in appetite - - - - - - -  Feeling bad or failure about yourself  - - - - - - -  Trouble concentrating - - - -  - - -  Moving slowly or fidgety/restless - - - - - - -  Suicidal thoughts - - - - - - -  PHQ-9 Score - - - - - - -    Review of Systems  Constitutional: Positive for unexpected  weight change.       Feels weak in legs --did not eat breakfast and is diabetic  HENT: Negative.   Eyes: Negative.   Respiratory: Negative.   Cardiovascular: Negative.   Gastrointestinal: Negative.   Endocrine:       High blood sugars, was 205 this am  Genitourinary: Negative.   Musculoskeletal: Negative.   Allergic/Immunologic: Negative.   Neurological: Negative.   Hematological: Negative.   Psychiatric/Behavioral: Negative.   All other systems reviewed and are negative.      Objective:   Physical Exam  Constitutional: She is oriented to person, place, and time. She appears well-developed and well-nourished.  HENT:  Head: Normocephalic and atraumatic.  Neck: Normal range of motion. Neck supple.  Cardiovascular: Normal rate and regular rhythm.  Pulmonary/Chest: Breath sounds normal. She is in respiratory distress.  Musculoskeletal:  Normal Muscle Bulk and Muscle Testing reveals:  Upper Extremities: Full ROM and Muscle Strength 5/5 Thoracic Paraspinal Tenderness: T-7-T-9 Lumbar Paraspinal Tenderness: L-3-L-5 Lower Extremities: Full ROM and Muscle Strength 5/5 Transferred to wheelchair and escorted to car via CMA  Neurological: She is alert and oriented to person, place, and time.  Skin: Skin is warm and dry.  Psychiatric: She has a normal mood and affect. Her behavior is normal.  Nursing note and vitals reviewed.         Assessment & Plan:  1.Spinal stenosis chronic low back pain radiating into the legs:07/26//2019:  Continuecurrent medication regimen:MSIR 15 mg take one tablet by mouththree times daily #90. We will continue the opioid monitoring program, this consists of regular clinic visits, examinations, urine drug screen, pill counts as well as use of [place] Controlled  Substance Reporting System. 2. Diabetic Neuropathy: Continuecurrent medication regime withGabapentin.02/07/2018 3. Left Knee Pain: S/P Peripheral Nerve Block received 6-8 hours of relief.Continue Current Medication and exercise Regimen. Ms. Pagan refuses Cryoablation. ( RSD) Continue Lidoderm Patches.02/07/2018 4. Left Hip Fracture S/P hemiarthroplasty: Orthopedist Following.02/07/2018 5. Generalized Weakness: Refuses ED Evaluation. 6. Orthostatic Hypotension: Orthostatic blood pressures obtained: She Refuses ED Evaluation. Instructed to follow up with her PCP and Nephrologist. 7. Social Issues: RX: Social Worker  60 minutes of face to face patient care time was spent during this visit. All questions were encouraged and answered.  F/U in 1 month

## 2018-02-07 NOTE — Patient Instructions (Addendum)
Keep a Blood Pressure Log: Check Blood Pressure  Three times a day.   Check Blood Pressure Prior to Medications:  Call your primary Doctor if your Blood Pressure is Low if the Top number is 100  Or below  and 60 bottom number less than 60.  Please call your Primary Doctor to inquire about your water pill: Lasix   Speak with your nephrologist and let her know you have been taking water pill twice a day    Call our office if you have any questions and concerns.

## 2018-02-10 ENCOUNTER — Telehealth: Payer: Self-pay | Admitting: Neurology

## 2018-02-10 NOTE — Telephone Encounter (Signed)
Patient had to reschedule til October. She wanted to make sure she could have her medications refilled until then. Thanks

## 2018-02-10 NOTE — Telephone Encounter (Signed)
Informed patient that we will keep her medications filled until her October appointment.

## 2018-02-17 ENCOUNTER — Ambulatory Visit: Payer: Self-pay | Admitting: Neurology

## 2018-03-04 ENCOUNTER — Ambulatory Visit: Payer: Self-pay | Admitting: Registered Nurse

## 2018-03-14 ENCOUNTER — Encounter: Payer: Self-pay | Admitting: Registered Nurse

## 2018-03-14 ENCOUNTER — Encounter: Payer: No Typology Code available for payment source | Attending: Registered Nurse | Admitting: Registered Nurse

## 2018-03-14 VITALS — BP 122/74 | HR 58 | Resp 14 | Ht 65.0 in | Wt 196.0 lb

## 2018-03-14 DIAGNOSIS — I10 Essential (primary) hypertension: Secondary | ICD-10-CM | POA: Insufficient documentation

## 2018-03-14 DIAGNOSIS — K219 Gastro-esophageal reflux disease without esophagitis: Secondary | ICD-10-CM | POA: Diagnosis not present

## 2018-03-14 DIAGNOSIS — G894 Chronic pain syndrome: Secondary | ICD-10-CM

## 2018-03-14 DIAGNOSIS — E1140 Type 2 diabetes mellitus with diabetic neuropathy, unspecified: Secondary | ICD-10-CM | POA: Insufficient documentation

## 2018-03-14 DIAGNOSIS — Z9049 Acquired absence of other specified parts of digestive tract: Secondary | ICD-10-CM | POA: Insufficient documentation

## 2018-03-14 DIAGNOSIS — I951 Orthostatic hypotension: Secondary | ICD-10-CM | POA: Diagnosis not present

## 2018-03-14 DIAGNOSIS — Z833 Family history of diabetes mellitus: Secondary | ICD-10-CM | POA: Diagnosis not present

## 2018-03-14 DIAGNOSIS — M48061 Spinal stenosis, lumbar region without neurogenic claudication: Secondary | ICD-10-CM | POA: Diagnosis not present

## 2018-03-14 DIAGNOSIS — Z96642 Presence of left artificial hip joint: Secondary | ICD-10-CM | POA: Insufficient documentation

## 2018-03-14 DIAGNOSIS — M545 Low back pain: Secondary | ICD-10-CM | POA: Diagnosis not present

## 2018-03-14 DIAGNOSIS — G4730 Sleep apnea, unspecified: Secondary | ICD-10-CM | POA: Insufficient documentation

## 2018-03-14 DIAGNOSIS — Z8249 Family history of ischemic heart disease and other diseases of the circulatory system: Secondary | ICD-10-CM | POA: Insufficient documentation

## 2018-03-14 DIAGNOSIS — Z818 Family history of other mental and behavioral disorders: Secondary | ICD-10-CM | POA: Insufficient documentation

## 2018-03-14 DIAGNOSIS — Z79891 Long term (current) use of opiate analgesic: Secondary | ICD-10-CM | POA: Diagnosis not present

## 2018-03-14 DIAGNOSIS — G8929 Other chronic pain: Secondary | ICD-10-CM | POA: Diagnosis not present

## 2018-03-14 DIAGNOSIS — Z79899 Other long term (current) drug therapy: Secondary | ICD-10-CM

## 2018-03-14 DIAGNOSIS — E079 Disorder of thyroid, unspecified: Secondary | ICD-10-CM | POA: Insufficient documentation

## 2018-03-14 DIAGNOSIS — M25562 Pain in left knee: Secondary | ICD-10-CM | POA: Diagnosis not present

## 2018-03-14 DIAGNOSIS — N289 Disorder of kidney and ureter, unspecified: Secondary | ICD-10-CM | POA: Insufficient documentation

## 2018-03-14 DIAGNOSIS — I770 Arteriovenous fistula, acquired: Secondary | ICD-10-CM | POA: Diagnosis not present

## 2018-03-14 DIAGNOSIS — Z5181 Encounter for therapeutic drug level monitoring: Secondary | ICD-10-CM | POA: Diagnosis not present

## 2018-03-14 DIAGNOSIS — M4800 Spinal stenosis, site unspecified: Secondary | ICD-10-CM | POA: Insufficient documentation

## 2018-03-14 DIAGNOSIS — Z9889 Other specified postprocedural states: Secondary | ICD-10-CM | POA: Insufficient documentation

## 2018-03-14 DIAGNOSIS — Z811 Family history of alcohol abuse and dependence: Secondary | ICD-10-CM | POA: Diagnosis not present

## 2018-03-14 DIAGNOSIS — Z96652 Presence of left artificial knee joint: Secondary | ICD-10-CM

## 2018-03-14 DIAGNOSIS — G90522 Complex regional pain syndrome I of left lower limb: Secondary | ICD-10-CM

## 2018-03-14 MED ORDER — MORPHINE SULFATE 15 MG PO TABS: 15.0000 mg | ORAL_TABLET | Freq: Three times a day (TID) | ORAL | 0 refills | Status: DC

## 2018-03-14 NOTE — Progress Notes (Signed)
Subjective:    Patient ID: Paige Arnold, female    DOB: 06/23/1941, 77 y.o.   MRN: [id number]  HPI: [name] is a 77 year old female who returns for follow up appointment for chronic pain and medication refill. She states her pain is located in her lower back and left knee. She rates her pain 5. Her current exercise regime is walking.   Paige Arnold Morphine Equivalent is 45.00 MME. Last UDS was Performed on 08/19/2017, it was consistent. Oral Swab was Performed today.    Pain Inventory Average Pain 6 Pain Right Now 5 My pain is sharp, burning, dull, stabbing, tingling and aching  In the last 24 hours, has pain interfered with the following? General activity 2 Relation with others 2 Enjoyment of life 5 What TIME of day is your pain at its worst? all Sleep (in general) Fair  Pain is worse with: walking and bending Pain improves with: rest, heat/ice and medication Relief from Meds: 4  Mobility use a cane ability to climb steps?  no do you drive?  no  Function retired I need assistance with the following:  meal prep, household duties and shopping  Neuro/Psych tremor tingling  Prior Studies Any changes since last visit?  no  Physicians involved in your care Any changes since last visit?  no   Family History  Problem Relation Age of Onset  . Kidney disease Mother   . Heart disease Father   . Diabetes Brother   . Alcohol abuse Brother   . Depression Brother   . Sarcoidosis Brother   . ALS Brother        Deceased, 34s  . Heart disease Brother   . Bipolar disorder Daughter    Social History   Socioeconomic History  . Marital status: Married    Spouse name: Not on file  . Number of children: 3  . Years of education: 64  . Highest education level: Not on file  Occupational History  . Occupation: retired  Scientific laboratory technician  . Financial resource strain: Not on file  . Food insecurity:    Worry: Not on file    Inability: Not on file  . Transportation  needs:    Medical: Not on file    Non-medical: Not on file  Tobacco Use  . Smoking status: Never Smoker  . Smokeless tobacco: Never Used  Substance and Sexual Activity  . Alcohol use: No    Alcohol/week: 0.0 standard drinks  . Drug use: No  . Sexual activity: Yes  Lifestyle  . Physical activity:    Days per week: Not on file    Minutes per session: Not on file  . Stress: Not on file  Relationships  . Social connections:    Talks on phone: Not on file    Gets together: Not on file    Attends religious service: Not on file    Active member of club or organization: Not on file    Attends meetings of clubs or organizations: Not on file    Relationship status: Not on file  Other Topics Concern  . Not on file  Social History Narrative   She lives with husband and daughter.  Three grown children.   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     She took early retirement 74 because of problems of RSD.   Highest level of education:  Graduated high school   Past Surgical History:  Procedure Laterality Date  .  A/V FISTULAGRAM N/A 09/20/2017   Procedure: A/V FISTULAGRAM - left arm;  Surgeon: Angelia Mould, MD;  Location: [facility];  Service: Cardiovascular;  Laterality: N/A;  . AV FISTULA PLACEMENT Left 02/16/2015   Procedure: LEFT ARM BRACHIOCEPHALIC (AV) FISTULA CREATION;  Surgeon: Mal Misty, MD;  Location: [facility];  Service: Vascular;  Laterality: Left;  . CHOLECYSTECTOMY    . INTRAOCULAR LENS INSERTION Right 10-21-13  . LIGATION OF COMPETING BRANCHES OF ARTERIOVENOUS FISTULA Left 02/16/2015   Procedure: LIGATION OF COMPETING BRANCHES OF LEFT BRACHIOCEPHALIC ARTERIOVENOUS FISTULA;  Surgeon: Mal Misty, MD;  Location: [facility];  Service: Vascular;  Laterality: Left;  . PARTIAL HIP ARTHROPLASTY    . PARTIAL KNEE ARTHROPLASTY     left  . PERIPHERAL VASCULAR BALLOON ANGIOPLASTY Left 09/20/2017   Procedure: PERIPHERAL VASCULAR BALLOON ANGIOPLASTY;   Surgeon: Angelia Mould, MD;  Location: [facility];  Service: Cardiovascular;  Laterality: Left;  left fistulagram  . TEMPOROMANDIBULAR JOINT SURGERY    . TONSILLECTOMY    . TUBAL LIGATION     Past Medical History:  Diagnosis Date  . Arthritis   . Diabetes mellitus   . Diabetic neuropathy ([place])   . GERD (gastroesophageal reflux disease)   . Hypertension   . Kidney disease   . Pneumonia Oct. 2016  . Shortness of breath dyspnea    when I first lay down, then it gets better  . Sleep apnea    does not wear CPAP   . Thyroid disorder    BP 122/74   Pulse (!) 58   Resp 14   Ht 5\' 5"  (1.651 m)   Wt 196 lb (88.9 kg)   SpO2 91%   BMI 32.62 kg/m   Opioid Risk Score:   Fall Risk Score:  `1  Depression screen PHQ 2/9  Depression screen [facility] 2/9 02/07/2018 10/08/2017 08/19/2017 07/19/2017 06/18/2017 05/16/2017 03/19/2017  Decreased Interest 0 - 0 0 0 0 0  Down, Depressed, Hopeless 0 0 0 0 0 0 0  PHQ - 2 Score 0 0 0 0 0 0 0  Altered sleeping - - - - - - -  Tired, decreased energy - - - - - - -  Change in appetite - - - - - - -  Feeling bad or failure about yourself  - - - - - - -  Trouble concentrating - - - - - - -  Moving slowly or fidgety/restless - - - - - - -  Suicidal thoughts - - - - - - -  PHQ-9 Score - - - - - - -    Review of Systems  Constitutional: Negative.   HENT: Negative.   Eyes: Negative.   Respiratory: Negative.   Cardiovascular: Negative.   Gastrointestinal: Negative.   Endocrine: Negative.   Genitourinary: Negative.   Musculoskeletal: Positive for arthralgias and back pain.  Skin: Negative.   Allergic/Immunologic: Negative.   Neurological: Positive for tremors.       Tingling  Hematological: Negative.   Psychiatric/Behavioral: Negative.        Objective:   Physical Exam  Constitutional: She is oriented to person, place, and time. She appears well-developed and well-nourished.  HENT:  Head: Normocephalic and atraumatic.  Neck: Normal  range of motion. Neck supple.  Cardiovascular: Normal rate and regular rhythm.  Pulmonary/Chest: Effort normal and breath sounds normal.  Musculoskeletal:  Normal Muscle Bulk and Muscle Testing Reveals:  Upper Extremities: Full ROM and Muscle Strength  5/5 Lumbar Paraspinal Tenderness: L-4-L-5 Lower Extremities: Right Full ROM and Muscle Strength 5/5 Left: Decreased ROM and Muscle Strength 5/5 Left Lower Extremity Flexion Produces Pain into Patella Arises from Table slowly using cane for support  Neurological: She is alert and oriented to person, place, and time.  Skin: Skin is warm and dry.  Psychiatric: She has a normal mood and affect. Her behavior is normal.  Nursing note and vitals reviewed.         Assessment & Plan:  1.Spinal stenosis chronic low back pain radiating into the legs:08/30//2019:  Refilled:MSIR 15 mg take one tablet by mouththree times daily #90. We will continue the opioid monitoring program, this consists of regular clinic visits, examinations, urine drug screen, pill counts as well as use of [place] Controlled Substance Reporting System. 2. Diabetic Neuropathy: Continuecurrent medication regime withGabapentin.03/14/2018 3. Left Knee Pain: S/P Peripheral Nerve Block received 6-8 hours of relief.Continue Current Medication and exercise Regimen. Ms. Higham refuses Cryoablation. ( RSD) Continue Lidoderm Patches.03/14/2018 4. Left Hip Fracture S/P hemiarthroplasty: Orthopedist Following.03/14/2018 5. Social Issues: Ms. Hornstein was given information for community Resources, she verbalizes understanding.   20 minutes of face to face patient care time was spent during this visit. All questions were encouraged and answered.  F/U in 1 month

## 2018-03-20 LAB — DRUG TOX MONITOR 1 W/CONF, ORAL FLD
Amphetamines: NEGATIVE ng/mL (ref <10)
BUPRENORPHINE: NEGATIVE ng/mL (ref <0.10)
Barbiturates: NEGATIVE ng/mL (ref <10)
Benzodiazepines: NEGATIVE ng/mL (ref <0.50)
Cocaine: NEGATIVE ng/mL (ref <5.0)
Codeine: NEGATIVE ng/mL (ref <2.5)
Dihydrocodeine: NEGATIVE ng/mL (ref <2.5)
FENTANYL: NEGATIVE ng/mL (ref <0.10)
HEROIN METABOLITE: NEGATIVE ng/mL (ref <1.0)
Hydrocodone: NEGATIVE ng/mL (ref <2.5)
Hydromorphone: NEGATIVE ng/mL (ref <2.5)
MARIJUANA: NEGATIVE ng/mL (ref <2.5)
MDMA: NEGATIVE ng/mL (ref <10)
MEPROBAMATE: NEGATIVE ng/mL (ref <2.5)
METHADONE: NEGATIVE ng/mL (ref <5.0)
MORPHINE: 5.5 ng/mL — AB (ref <2.5)
NORHYDROCODONE: NEGATIVE ng/mL (ref <2.5)
NOROXYCODONE: NEGATIVE ng/mL (ref <2.5)
Nicotine Metabolite: NEGATIVE ng/mL (ref <5.0)
OXYCODONE: NEGATIVE ng/mL (ref <2.5)
OXYMORPHONE: NEGATIVE ng/mL (ref <2.5)
Opiates: POSITIVE ng/mL — AB (ref <2.5)
Phencyclidine: NEGATIVE ng/mL (ref <10)
Tapentadol: NEGATIVE ng/mL (ref <5.0)
Tramadol: NEGATIVE ng/mL (ref <5.0)
Zolpidem: NEGATIVE ng/mL (ref <5.0)

## 2018-03-20 LAB — DRUG TOX ALC METAB W/CON, ORAL FLD: ALCOHOL METABOLITE: NEGATIVE ng/mL (ref <25)

## 2018-03-21 ENCOUNTER — Telehealth: Payer: Self-pay | Admitting: *Deleted

## 2018-03-21 NOTE — Telephone Encounter (Signed)
Oral swab drug screen was consistent for prescribed medications.  ?

## 2018-04-10 ENCOUNTER — Encounter: Payer: Self-pay | Admitting: Registered Nurse

## 2018-04-10 ENCOUNTER — Other Ambulatory Visit: Payer: Self-pay

## 2018-04-10 ENCOUNTER — Encounter: Payer: No Typology Code available for payment source | Attending: Registered Nurse | Admitting: Registered Nurse

## 2018-04-10 VITALS — BP 135/62 | HR 58 | Ht 65.0 in | Wt 199.4 lb

## 2018-04-10 DIAGNOSIS — M545 Low back pain: Secondary | ICD-10-CM | POA: Insufficient documentation

## 2018-04-10 DIAGNOSIS — E079 Disorder of thyroid, unspecified: Secondary | ICD-10-CM | POA: Insufficient documentation

## 2018-04-10 DIAGNOSIS — I770 Arteriovenous fistula, acquired: Secondary | ICD-10-CM | POA: Diagnosis not present

## 2018-04-10 DIAGNOSIS — Z5181 Encounter for therapeutic drug level monitoring: Secondary | ICD-10-CM

## 2018-04-10 DIAGNOSIS — Z833 Family history of diabetes mellitus: Secondary | ICD-10-CM | POA: Diagnosis not present

## 2018-04-10 DIAGNOSIS — M48061 Spinal stenosis, lumbar region without neurogenic claudication: Secondary | ICD-10-CM | POA: Diagnosis not present

## 2018-04-10 DIAGNOSIS — G8929 Other chronic pain: Secondary | ICD-10-CM | POA: Diagnosis present

## 2018-04-10 DIAGNOSIS — I951 Orthostatic hypotension: Secondary | ICD-10-CM | POA: Diagnosis not present

## 2018-04-10 DIAGNOSIS — M4800 Spinal stenosis, site unspecified: Secondary | ICD-10-CM | POA: Diagnosis not present

## 2018-04-10 DIAGNOSIS — G90522 Complex regional pain syndrome I of left lower limb: Secondary | ICD-10-CM | POA: Diagnosis not present

## 2018-04-10 DIAGNOSIS — Z96642 Presence of left artificial hip joint: Secondary | ICD-10-CM | POA: Diagnosis not present

## 2018-04-10 DIAGNOSIS — G894 Chronic pain syndrome: Secondary | ICD-10-CM

## 2018-04-10 DIAGNOSIS — Z79891 Long term (current) use of opiate analgesic: Secondary | ICD-10-CM

## 2018-04-10 DIAGNOSIS — Z9889 Other specified postprocedural states: Secondary | ICD-10-CM | POA: Insufficient documentation

## 2018-04-10 DIAGNOSIS — N289 Disorder of kidney and ureter, unspecified: Secondary | ICD-10-CM | POA: Diagnosis not present

## 2018-04-10 DIAGNOSIS — Z96652 Presence of left artificial knee joint: Secondary | ICD-10-CM | POA: Diagnosis not present

## 2018-04-10 DIAGNOSIS — Z9049 Acquired absence of other specified parts of digestive tract: Secondary | ICD-10-CM | POA: Diagnosis not present

## 2018-04-10 DIAGNOSIS — Z818 Family history of other mental and behavioral disorders: Secondary | ICD-10-CM | POA: Insufficient documentation

## 2018-04-10 DIAGNOSIS — I10 Essential (primary) hypertension: Secondary | ICD-10-CM | POA: Diagnosis not present

## 2018-04-10 DIAGNOSIS — M25562 Pain in left knee: Secondary | ICD-10-CM | POA: Diagnosis not present

## 2018-04-10 DIAGNOSIS — Z811 Family history of alcohol abuse and dependence: Secondary | ICD-10-CM | POA: Insufficient documentation

## 2018-04-10 DIAGNOSIS — E1140 Type 2 diabetes mellitus with diabetic neuropathy, unspecified: Secondary | ICD-10-CM | POA: Insufficient documentation

## 2018-04-10 DIAGNOSIS — Z8249 Family history of ischemic heart disease and other diseases of the circulatory system: Secondary | ICD-10-CM | POA: Diagnosis not present

## 2018-04-10 DIAGNOSIS — G4730 Sleep apnea, unspecified: Secondary | ICD-10-CM | POA: Insufficient documentation

## 2018-04-10 DIAGNOSIS — K219 Gastro-esophageal reflux disease without esophagitis: Secondary | ICD-10-CM | POA: Insufficient documentation

## 2018-04-10 DIAGNOSIS — Z79899 Other long term (current) drug therapy: Secondary | ICD-10-CM

## 2018-04-10 MED ORDER — MORPHINE SULFATE 15 MG PO TABS: 15.0000 mg | ORAL_TABLET | Freq: Three times a day (TID) | ORAL | 0 refills | Status: DC

## 2018-04-10 NOTE — Progress Notes (Signed)
Subjective:    Patient ID: Paige Arnold, female    DOB: January 03, 1941, 77 y.o.   MRN: [id number]  HPI: Paige Arnold is a 77 year old female who returns for follow up appointment for chronic pain and medication refill. She states her pain is located in her lower back and left knee. She rates her pain 4. Her current exercise regime is walking and receiving physical therapy twice a week.   Paige Arnold is 45.00 MME. Last Oral Swab was Performed on 03/14/2018, it was consistent.   Pain Inventory Average Pain 4 Pain Right Now 4 My pain is sharp, burning, dull and stabbing  In the last 24 hours, has pain interfered with the following? General activity 2 Relation with others 2 Enjoyment of life 5 What TIME of day is your pain at its worst? all Sleep (in general) Poor  Pain is worse with: walking, bending, standing and some activites Pain improves with: rest, heat/ice and medication Relief from Meds: 3  Mobility use a cane ability to climb steps?  no do you drive?  no  Function retired I need assistance with the following:  meal prep, household duties and shopping  Neuro/Psych trouble walking  Prior Studies Any changes since last visit?  no  Physicians involved in your care Any changes since last visit?  no   Family History  Problem Relation Age of Onset  . Kidney disease Mother   . Heart disease Father   . Diabetes Brother   . Alcohol abuse Brother   . Depression Brother   . Sarcoidosis Brother   . ALS Brother        Deceased, 69s  . Heart disease Brother   . Bipolar disorder Daughter    Social History   Socioeconomic History  . Marital status: Married    Spouse name: Not on file  . Number of children: 3  . Years of education: 34  . Highest education level: Not on file  Occupational History  . Occupation: retired  Scientific laboratory technician  . Financial resource strain: Not on file  . Food insecurity:    Worry: Not on file    Inability: Not on file    . Transportation needs:    Medical: Not on file    Non-medical: Not on file  Tobacco Use  . Smoking status: Never Smoker  . Smokeless tobacco: Never Used  Substance and Sexual Activity  . Alcohol use: No    Alcohol/week: 0.0 standard drinks  . Drug use: No  . Sexual activity: Yes  Lifestyle  . Physical activity:    Days per week: Not on file    Minutes per session: Not on file  . Stress: Not on file  Relationships  . Social connections:    Talks on phone: Not on file    Gets together: Not on file    Attends religious service: Not on file    Active member of club or organization: Not on file    Attends meetings of clubs or organizations: Not on file    Relationship status: Not on file  Other Topics Concern  . Not on file  Social History Narrative   She lives with husband and daughter.  Three grown children.   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     She took early retirement 82 because of problems of RSD.   Highest level of education:  Graduated high school   Past Surgical History:  Procedure Laterality Date  . A/V FISTULAGRAM N/A 09/20/2017   Procedure: A/V FISTULAGRAM - left arm;  Surgeon: Angelia Mould, MD;  Location: [facility];  Service: Cardiovascular;  Laterality: N/A;  . AV FISTULA PLACEMENT Left 02/16/2015   Procedure: LEFT ARM BRACHIOCEPHALIC (AV) FISTULA CREATION;  Surgeon: Mal Misty, MD;  Location: [facility];  Service: Vascular;  Laterality: Left;  . CHOLECYSTECTOMY    . INTRAOCULAR LENS INSERTION Right 10-21-13  . LIGATION OF COMPETING BRANCHES OF ARTERIOVENOUS FISTULA Left 02/16/2015   Procedure: LIGATION OF COMPETING BRANCHES OF LEFT BRACHIOCEPHALIC ARTERIOVENOUS FISTULA;  Surgeon: Mal Misty, MD;  Location: [facility];  Service: Vascular;  Laterality: Left;  . PARTIAL HIP ARTHROPLASTY    . PARTIAL KNEE ARTHROPLASTY     left  . PERIPHERAL VASCULAR BALLOON ANGIOPLASTY Left 09/20/2017   Procedure: PERIPHERAL VASCULAR BALLOON  ANGIOPLASTY;  Surgeon: Angelia Mould, MD;  Location: [facility];  Service: Cardiovascular;  Laterality: Left;  left fistulagram  . TEMPOROMANDIBULAR JOINT SURGERY    . TONSILLECTOMY    . TUBAL LIGATION     Past Medical History:  Diagnosis Date  . Arthritis   . Diabetes mellitus   . Diabetic neuropathy ([place])   . GERD (gastroesophageal reflux disease)   . Hypertension   . Kidney disease   . Pneumonia Oct. 2016  . Shortness of breath dyspnea    when I first lay down, then it gets better  . Sleep apnea    does not wear CPAP   . Thyroid disorder    BP 135/62   Pulse (!) 51   Ht 5\' 5"  (1.651 m)   Wt 199 lb 6.4 oz (90.4 kg) Comment: pt has heavier shoes today  SpO2 93%   BMI 33.18 kg/m   Opioid Risk Score:   Fall Risk Score:  `1  Depression screen PHQ 2/9  Depression screen [facility] 2/9 04/10/2018 02/07/2018 10/08/2017 08/19/2017 07/19/2017 06/18/2017 05/16/2017  Decreased Interest 0 0 - 0 0 0 0  Down, Depressed, Hopeless 0 0 0 0 0 0 0  PHQ - 2 Score 0 0 0 0 0 0 0  Altered sleeping - - - - - - -  Tired, decreased energy - - - - - - -  Change in appetite - - - - - - -  Feeling bad or failure about yourself  - - - - - - -  Trouble concentrating - - - - - - -  Moving slowly or fidgety/restless - - - - - - -  Suicidal thoughts - - - - - - -  PHQ-9 Score - - - - - - -   Review of Systems  Constitutional: Positive for unexpected weight change.  HENT: Negative.   Eyes: Negative.   Respiratory: Negative.   Cardiovascular: Negative.   Gastrointestinal: Positive for constipation.  Endocrine: Negative.   Genitourinary: Negative.   Musculoskeletal: Negative.   Skin: Negative.   Allergic/Immunologic: Negative.   Neurological: Negative.   Hematological: Negative.   Psychiatric/Behavioral: Negative.   All other systems reviewed and are negative.      Objective:   Physical Exam  Constitutional: She is oriented to person, place, and time. She appears well-developed and  well-nourished.  HENT:  Head: Normocephalic and atraumatic.  Neck: Normal range of motion. Neck supple.  Cardiovascular: Normal rate and regular rhythm.  Pulmonary/Chest: Effort normal and breath sounds normal.  Musculoskeletal:  Normal Muscle Bulk and Muscle Testing Reveals: Upper Extremities:  Decreased ROM 90 Degrees and Muscle Strength 5/5 Bilateral AC Joint Tenderness Lumbar Paraspinal Tenderness: L-3-L-5 Lower Extremities: Right: Full ROM and Muscle Strength 5/5 Left: Decreased ROM and Muscle Strength 4/5 Left Lower Extremity Flexion Produces Pain into Extremity, Left Hip and Left Patella Arises from Table Slowly, using cane for support Antalgic gait   Neurological: She is alert and oriented to person, place, and time.  Skin: Skin is warm and dry.  Psychiatric: She has a normal mood and affect. Her behavior is normal.  Nursing note and vitals reviewed.         Assessment & Plan:  1.Spinal stenosis chronic low back pain radiating into the legs:09/26//2019:  Refilled:MSIR 15 mg take one tablet by mouththree times daily #90. We will continue the opioid monitoring program, this consists of regular clinic visits, examinations, urine drug screen, pill counts as well as use of [place] Controlled Substance Reporting System. 2. Diabetic Neuropathy: Continuecurrent medication regime withGabapentin.04/10/2018 3. Left Knee Pain: S/P Peripheral Nerve Block received 6-8 hours of relief.Continue Current Medication and exercise Regimen. Ms. Tayag refuses Cryoablation. ( RSD) Continue Lidoderm Patches.04/10/2018 4. Left Hip Fracture S/P hemiarthroplasty: Orthopedist Following.04/10/2018  20 minutes of face to face patient care time was spent during this visit. All questions were encouraged and answered.  F/U in 1 month

## 2018-04-21 ENCOUNTER — Encounter

## 2018-04-21 ENCOUNTER — Encounter: Payer: Self-pay | Admitting: Neurology

## 2018-04-21 ENCOUNTER — Ambulatory Visit (INDEPENDENT_AMBULATORY_CARE_PROVIDER_SITE_OTHER): Payer: Medicare Other | Admitting: Neurology

## 2018-04-21 VITALS — BP 124/66 | HR 52 | Ht 65.0 in | Wt 203.0 lb

## 2018-04-21 DIAGNOSIS — F329 Major depressive disorder, single episode, unspecified: Secondary | ICD-10-CM | POA: Diagnosis not present

## 2018-04-21 DIAGNOSIS — Z79899 Other long term (current) drug therapy: Secondary | ICD-10-CM | POA: Diagnosis not present

## 2018-04-21 DIAGNOSIS — F32A Depression, unspecified: Secondary | ICD-10-CM

## 2018-04-21 DIAGNOSIS — F0390 Unspecified dementia without behavioral disturbance: Secondary | ICD-10-CM | POA: Diagnosis not present

## 2018-04-21 MED ORDER — DONEPEZIL HCL 10 MG PO TABS: 10.0000 mg | ORAL_TABLET | Freq: Every day | ORAL | 3 refills | Status: DC

## 2018-04-21 NOTE — Progress Notes (Signed)
Follow-up Visit   Date: 04/21/18    Paige Arnold MRN: [id number] DOB: 04-Jan-1941   Interim History: Paige Arnold is a 77 y.o. right-handed Caucasian female with hypertension, diabetes mellitus, depression/anxiety, hypothyroidism, and chronic pain returning for follow-up of memory loss, neuropathy, and tremors. The patient was accompanied to the clinic by self.  History of present illness: She has neuropathy for the past two years, described throbbing.  She does not burning or tingling, but says that she cannot feel her feet.  The numbness extends to where her ankles are.  She endorses problems with balance and has been using a cane since her knee surgery in 2001.  She takes gabapentin 400mg  in the morning and 800mg  at bedtime.  She reports having previous NCS/EMG of her hands and fee (results unknown)t.  She also complains of memory problems for a number of years.  She does not drive or pay her own bills.  She does not cook, clean, or have any hobbies.  Her day is spent watching TV and sleeping. She does not exercise.  She takes aricept 10mg  daily. She feels that her memory is slowly worsening.  She does not keep up with the date or appointments.  She forgets peoples names.  She does not repeat herself.  She cannot multitask. She says that neuropsychological testing as well as imaging has been performed, but I do not have these records.  Starting in 2015, she developed tremors which as noticeable when she holds objects.  She feels like her whole body has an internal tremor, but does not actually see it.    She endorses feeling of anxiety and depressed mood.  She has seen psychiatry and counsellors in the past, but they didn't "click" with her so she stopped seeing them.  She says "I want someone to listen to me" and began crying.  No SI/HI.  Of note, she has RSD and lumbar stenosis and is on morphine 15mg  qh6 prn pain, followed by Dr. Letta Pate.  UPDATE 02/18/2017:  She is here for 1  year follow-up visit.  Unfortunately, she has no relief with neuropathic pain despite being on gabapentin 300mg  in the morning and 600mg  at bedtime.  She had CKD which prevents further titration. She also takes morphine 15mg  TID for her back pain which is prescribed by pain management and Cymbalta 30mg  daily. She denies any new or worsening tremors or memory changes.  She is unable to take care of IADLs such as cooking, cleaning, and managing medications due to her chronic pain.  UPDATE 08/22/2017:  Over the past several months, daughter has noticed progressive worsening of her memory such that she is forgets details of conversation and often repeats herself.  She is able to remember faces and names.  She takes her own medications and is not forgetting them.  She is not getting lost and there has been no changes in behavior, although daughter states that she is very sedentary.  She has [employer] for years and does not use it. She has no interest to engage socially or do any physical activity.  She enjoys watching TV all day and does not have any other hobbies.  She states that her mood is down and she can feel irritable.  Her family, PCP, and nephrologist have encouraged her to see a psychiatrist, but she has declined this.  Recent medication changes include increasing Cymbalta to 60mg /d and had reduced gabapentin 300mg  twice daily; she remains on morphine 15mg  TID  which is prescribed by pain management.    UPDATE 04/21/2018:  She is here for 6 month follow-up visit.  Overall, she has not noticed a significant change in her memory.  Her daughter manages her medications, husband manges finances and meals.  She is very sedentary and does not engage in daily chores.  She manages basic ADLs, such as bathing and dressing.  She has a home Programmer, applications and PT coming.  Mood is fair, she feels that her mood and level of pain plays the biggest factor on whether she will have a good day or bad day.  She has not seen  psychiatry and does not have any interest.    Medications:  Current Outpatient Medications on File Prior to Visit  Medication Sig Dispense Refill  . acetaminophen (TYLENOL) 500 MG tablet Take 1,000 mg by mouth every 6 (six) hours as needed for moderate pain or headache.    . Alum Hydroxide-Mag Trisilicate (GAVISCON) 26-71.2 MG CHEW Chew 1 tablet by mouth daily as needed (heartburn).    Marland Kitchen aspirin 81 MG tablet Take 81 mg by mouth daily.    . chlorhexidine (PERIDEX) 0.12 % solution SWISH WITH 1/2OZ FOR 30SECS, THEN SPIT OUT TWICE DAILY  9  . cloNIDine (CATAPRES) 0.1 MG tablet Take 0.1 mg by mouth daily.     Marland Kitchen dicyclomine (BENTYL) 10 MG capsule Take 10 mg by mouth daily.    . Dulaglutide (TRULICITY) 1.5 WP/8.0DX SOPN Inject 1.5 mg into the skin every Sunday.    . DULoxetine (CYMBALTA) 60 MG capsule Take 60 mg by mouth daily.    . furosemide (LASIX) 80 MG tablet Take 80 mg by mouth 2 (two) times daily.     Marland Kitchen gabapentin (NEURONTIN) 300 MG capsule TAKE 1 CAPSULE BY MOUTH EVERY MORNING AND 2 CAPSULES EVERY EVENING (Patient taking differently: Take 300 mg by mouth 2 (two) times daily. ) 270 capsule 3  . glimepiride (AMARYL) 4 MG tablet Take 4 mg by mouth daily with breakfast.    . levothyroxine (SYNTHROID, LEVOTHROID) 125 MCG tablet Take 125 mcg by mouth daily before breakfast.    . lidocaine (LIDODERM) 5 % Place 1 patch onto the skin daily. Remove & Discard patch within 12 hours or as directed by MD (Patient taking differently: Place 1 patch onto the skin daily as needed (pain). Remove & Discard patch within 12 hours or as directed by MD) 30 patch 1  . morphine (MSIR) 15 MG tablet Take 1 tablet (15 mg total) by mouth 3 (three) times daily. 90 tablet 0  . Multiple Vitamins-Minerals (CENTRUM SILVER PO) Take 1 tablet by mouth daily.    . Omega-3 Fatty Acids (FISH OIL) 1000 MG CAPS Take 1,000 mg by mouth 2 (two) times daily.     Marland Kitchen OVER THE COUNTER MEDICATION Take 1 tablet by mouth daily. NeuRx TF    .  pravastatin (PRAVACHOL) 40 MG tablet Take 40 mg by mouth at bedtime.     . propranolol (INDERAL) 20 MG tablet Take 20 mg by mouth 2 (two) times daily.  1  . ranitidine (ZANTAC) 150 MG tablet Take 150 mg by mouth 2 (two) times daily.    . vitamin B-12 (CYANOCOBALAMIN) 1000 MCG tablet Take 1,000 mcg by mouth daily.     No current facility-administered medications on file prior to visit.     Allergies:  Allergies  Allergen Reactions  . Furosemide Other (See Comments)    Malaise (intolerance)  . Sulfa Antibiotics Itching  Review of Systems:  CONSTITUTIONAL: No fevers, chills, night sweats, or weight loss.  EYES: No visual changes or eye pain ENT: No hearing changes.  No history of nose bleeds.   RESPIRATORY: No cough, wheezing and shortness of breath.   CARDIOVASCULAR: Negative for chest pain, and palpitations.   GI: Negative for abdominal discomfort, blood in stools or black stools.  No recent change in bowel habits.   GU:  No history of incontinence.   MUSCLOSKELETAL: ++history of joint pain or swelling.  +myalgias.   SKIN: Negative for lesions, rash, and itching.   ENDOCRINE: Negative for cold or heat intolerance, polydipsia or goiter.   PSYCH:  + depression or anxiety symptoms.   NEURO: As Above.   Vital Signs:  BP 124/66   Pulse (!) 52   Ht 5\' 5"  (1.651 m)   Wt 203 lb (92.1 kg)   SpO2 96%   BMI 33.78 kg/m   General Medical Exam:   General:  Well appearing, comfortable  Eyes/ENT: see cranial nerve examination.   Neck:   No carotid bruits. Respiratory:  Clear to auscultation, good air entry bilaterally.   Cardiac:  Regular rate and rhythm, no murmur.   Ext:  No edema  Neurological Exam: MENTAL STATUS including orientation to time, place, person, recent and remote memory, attention span and concentration, language, and fund of knowledge is fair.  She correctly identified the year, month, and day of the week.  She missed the date by a few days.   Speech is not  dysarthric.  Montreal Cognitive Assessment  04/21/2018 08/22/2017 01/06/2015  Visuospatial/ Executive (0/5) 3 4 5   Naming (0/3) 2 3 2   Attention: Read list of digits (0/2) 1 2 2   Attention: Read list of letters (0/1) 1 1 1   Attention: Serial 7 subtraction starting at 100 (0/3) 0 0 2  Language: Repeat phrase (0/2) 2 0 2  Language : Fluency (0/1) 0 0 0  Abstraction (0/2) 1 1 1   Delayed Recall (0/5) 0 0 0  Orientation (0/6) 6 5 5   Total 16 16 20   Adjusted Score (based on education) 17 17 21     CRANIAL NERVES:  Pupils are round and reactive to light. Extraocular muscles are intact. Face is symmetric. Tongue is midline.  MOTOR:  Motor strength is 5/5 in all extremities. No tremors.  COORDINATION/GAIT:  Gait is slow, antalgic and assisted with cane.  Data: Lab Results  Component Value Date   TSH 3.27 08/22/2017   Lab Results  Component Value Date   VITAMINB12 >1500 (H) 08/22/2017     Lab Results  Component Value Date   HGBA1C 6.3 (H) 03/09/2014     IMPRESSION/PLAN: Multifactorial cognitive impairment due to mood disorder, polypharmacy, and dementia syndrome. Overall, there is no marked change in her cognition, her MOCA today is stable at 17/30, again there is poor effort.  I have stressed at previous visits that the major contributor of her memory changes is due to her untreated depression.  She will not see psychiatry or a counselor, which has strained her relationship with her daughter, who also agrees that she needs to see psychiatry.  Unfortunately, there is very little I can offer her.  I will continue her on Aricept 10mg  daily as there maybe some mild underlying dementia Formal neuropsychological testing was declined  Chronic pain due to low back pain, diabetic peripheral neuropathy, as well as other MSK pain is followed by pain management and takes gabapentin 300 mg twice daily, Cymbalta  60 mg daily, and morphine 15mg  TID.  Greater than 50% of this 35 minute visit was  spent in counseling, explanation of diagnosis, planning of further management, and coordination of care.  Thank you for allowing me to participate in patient's care.  If I can answer any additional questions, I would be pleased to do so.    Sincerely,    Donika K. Posey Pronto, DO

## 2018-04-21 NOTE — Patient Instructions (Signed)
Continue Aricept 10mg  daily  Return to clinic in 1 year

## 2018-04-24 ENCOUNTER — Other Ambulatory Visit: Payer: Self-pay | Admitting: Neurology

## 2018-05-06 ENCOUNTER — Encounter: Payer: No Typology Code available for payment source | Attending: Registered Nurse

## 2018-05-06 ENCOUNTER — Encounter: Payer: Self-pay | Admitting: Physical Medicine & Rehabilitation

## 2018-05-06 ENCOUNTER — Ambulatory Visit (HOSPITAL_BASED_OUTPATIENT_CLINIC_OR_DEPARTMENT_OTHER): Payer: No Typology Code available for payment source | Admitting: Physical Medicine & Rehabilitation

## 2018-05-06 VITALS — BP 170/75 | HR 46 | Ht 65.0 in | Wt 202.0 lb

## 2018-05-06 DIAGNOSIS — G8929 Other chronic pain: Secondary | ICD-10-CM | POA: Diagnosis present

## 2018-05-06 DIAGNOSIS — E1149 Type 2 diabetes mellitus with other diabetic neurological complication: Secondary | ICD-10-CM | POA: Diagnosis not present

## 2018-05-06 DIAGNOSIS — M4800 Spinal stenosis, site unspecified: Secondary | ICD-10-CM | POA: Diagnosis not present

## 2018-05-06 DIAGNOSIS — Z8249 Family history of ischemic heart disease and other diseases of the circulatory system: Secondary | ICD-10-CM | POA: Diagnosis not present

## 2018-05-06 DIAGNOSIS — Z818 Family history of other mental and behavioral disorders: Secondary | ICD-10-CM | POA: Diagnosis not present

## 2018-05-06 DIAGNOSIS — Z96642 Presence of left artificial hip joint: Secondary | ICD-10-CM | POA: Diagnosis not present

## 2018-05-06 DIAGNOSIS — Z811 Family history of alcohol abuse and dependence: Secondary | ICD-10-CM | POA: Insufficient documentation

## 2018-05-06 DIAGNOSIS — I10 Essential (primary) hypertension: Secondary | ICD-10-CM | POA: Insufficient documentation

## 2018-05-06 DIAGNOSIS — N289 Disorder of kidney and ureter, unspecified: Secondary | ICD-10-CM | POA: Diagnosis not present

## 2018-05-06 DIAGNOSIS — K219 Gastro-esophageal reflux disease without esophagitis: Secondary | ICD-10-CM | POA: Diagnosis not present

## 2018-05-06 DIAGNOSIS — M545 Low back pain: Secondary | ICD-10-CM | POA: Diagnosis not present

## 2018-05-06 DIAGNOSIS — I770 Arteriovenous fistula, acquired: Secondary | ICD-10-CM | POA: Diagnosis not present

## 2018-05-06 DIAGNOSIS — I951 Orthostatic hypotension: Secondary | ICD-10-CM | POA: Insufficient documentation

## 2018-05-06 DIAGNOSIS — Z9049 Acquired absence of other specified parts of digestive tract: Secondary | ICD-10-CM | POA: Diagnosis not present

## 2018-05-06 DIAGNOSIS — M25562 Pain in left knee: Secondary | ICD-10-CM | POA: Insufficient documentation

## 2018-05-06 DIAGNOSIS — E1140 Type 2 diabetes mellitus with diabetic neuropathy, unspecified: Secondary | ICD-10-CM | POA: Diagnosis not present

## 2018-05-06 DIAGNOSIS — M48061 Spinal stenosis, lumbar region without neurogenic claudication: Secondary | ICD-10-CM

## 2018-05-06 DIAGNOSIS — G4730 Sleep apnea, unspecified: Secondary | ICD-10-CM | POA: Insufficient documentation

## 2018-05-06 DIAGNOSIS — E079 Disorder of thyroid, unspecified: Secondary | ICD-10-CM | POA: Diagnosis not present

## 2018-05-06 DIAGNOSIS — Z833 Family history of diabetes mellitus: Secondary | ICD-10-CM | POA: Insufficient documentation

## 2018-05-06 DIAGNOSIS — Z96652 Presence of left artificial knee joint: Secondary | ICD-10-CM | POA: Diagnosis not present

## 2018-05-06 DIAGNOSIS — Z9889 Other specified postprocedural states: Secondary | ICD-10-CM | POA: Insufficient documentation

## 2018-05-06 NOTE — Progress Notes (Signed)
Subjective:    Patient ID: Paige Arnold, female    DOB: January 21, 1941, 77 y.o.   MRN: [id number]  HPI  77 yo female with chronic low back pain from spinal stenosis as well as RSD Left knee post op TKR Blood sugars running low working with PCP Pt states pain is worse but pain levels actually lower today than 6 mo ago No falls  Reviewed Neurology note, pt endorses memory problens, has elevated depression score Poor activity level, daughter deals with most of her MDs and appts Pt no longer drives, she rides with her husband Chronic constipation even before she started on MSO4  Takes shorts acting low dose 45mg  per day No falls at home  Pain Inventory Average Pain 4 Pain Right Now 4 My pain is na  In the last 24 hours, has pain interfered with the following? General activity 2 Relation with others 2 Enjoyment of life 5 What TIME of day is your pain at its worst? daytime Sleep (in general) Fair  Pain is worse with: walking, bending and some activites Pain improves with: rest, heat/ice and medication Relief from Meds: 5  Mobility use a cane use a walker ability to climb steps?  no do you drive?  no  Function retired  Neuro/Psych trouble walking  Prior Studies Any changes since last visit?  no  Physicians involved in your care Any changes since last visit?  no   Family History  Problem Relation Age of Onset  . Kidney disease Mother   . Heart disease Father   . Diabetes Brother   . Alcohol abuse Brother   . Depression Brother   . Sarcoidosis Brother   . ALS Brother        Deceased, 25s  . Heart disease Brother   . Bipolar disorder Daughter    Social History   Socioeconomic History  . Marital status: Married    Spouse name: Not on file  . Number of children: 3  . Years of education: 69  . Highest education level: Not on file  Occupational History  . Occupation: retired  Scientific laboratory technician  . Financial resource strain: Not on file  . Food insecurity:   Worry: Not on file    Inability: Not on file  . Transportation needs:    Medical: Not on file    Non-medical: Not on file  Tobacco Use  . Smoking status: Never Smoker  . Smokeless tobacco: Never Used  Substance and Sexual Activity  . Alcohol use: No    Alcohol/week: 0.0 standard drinks  . Drug use: No  . Sexual activity: Yes  Lifestyle  . Physical activity:    Days per week: Not on file    Minutes per session: Not on file  . Stress: Not on file  Relationships  . Social connections:    Talks on phone: Not on file    Gets together: Not on file    Attends religious service: Not on file    Active member of club or organization: Not on file    Attends meetings of clubs or organizations: Not on file    Relationship status: Not on file  Other Topics Concern  . Not on file  Social History Narrative   She lives with husband and daughter.  Three grown children.   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     She took early retirement 8 because of problems of RSD.   Highest level of education:  Graduated high school   Past Surgical History:  Procedure Laterality Date  . A/V FISTULAGRAM N/A 09/20/2017   Procedure: A/V FISTULAGRAM - left arm;  Surgeon: Angelia Mould, MD;  Location: [facility];  Service: Cardiovascular;  Laterality: N/A;  . AV FISTULA PLACEMENT Left 02/16/2015   Procedure: LEFT ARM BRACHIOCEPHALIC (AV) FISTULA CREATION;  Surgeon: Mal Misty, MD;  Location: [place];  Service: Vascular;  Laterality: Left;  . CHOLECYSTECTOMY    . INTRAOCULAR LENS INSERTION Right 10-21-13  . LIGATION OF COMPETING BRANCHES OF ARTERIOVENOUS FISTULA Left 02/16/2015   Procedure: LIGATION OF COMPETING BRANCHES OF LEFT BRACHIOCEPHALIC ARTERIOVENOUS FISTULA;  Surgeon: Mal Misty, MD;  Location: [place];  Service: Vascular;  Laterality: Left;  . PARTIAL HIP ARTHROPLASTY    . PARTIAL KNEE ARTHROPLASTY     left  . PERIPHERAL VASCULAR BALLOON ANGIOPLASTY Left  09/20/2017   Procedure: PERIPHERAL VASCULAR BALLOON ANGIOPLASTY;  Surgeon: Angelia Mould, MD;  Location: [facility];  Service: Cardiovascular;  Laterality: Left;  left fistulagram  . TEMPOROMANDIBULAR JOINT SURGERY    . TONSILLECTOMY    . TUBAL LIGATION     Past Medical History:  Diagnosis Date  . Arthritis   . Diabetes mellitus   . Diabetic neuropathy ([place])   . GERD (gastroesophageal reflux disease)   . Hypertension   . Kidney disease   . Pneumonia Oct. 2016  . Shortness of breath dyspnea    when I first lay down, then it gets better  . Sleep apnea    does not wear CPAP   . Thyroid disorder    BP (!) 170/75   Pulse (!) 46   Ht 5\' 5"  (1.651 m)   Wt 202 lb (91.6 kg)   SpO2 96%   BMI 33.61 kg/m   Opioid Risk Score:   Fall Risk Score:  `1  Depression screen PHQ 2/9  Depression screen [facility] 2/9 04/10/2018 02/07/2018 10/08/2017 08/19/2017 07/19/2017 06/18/2017 05/16/2017  Decreased Interest 0 0 - 0 0 0 0  Down, Depressed, Hopeless 0 0 0 0 0 0 0  PHQ - 2 Score 0 0 0 0 0 0 0  Altered sleeping - - - - - - -  Tired, decreased energy - - - - - - -  Change in appetite - - - - - - -  Feeling bad or failure about yourself  - - - - - - -  Trouble concentrating - - - - - - -  Moving slowly or fidgety/restless - - - - - - -  Suicidal thoughts - - - - - - -  PHQ-9 Score - - - - - - -     Review of Systems  Constitutional: Positive for unexpected weight change.  HENT: Negative.   Eyes: Negative.   Respiratory: Negative.   Cardiovascular: Negative.   Gastrointestinal: Negative.   Endocrine: Negative.   Genitourinary: Negative.   Musculoskeletal: Positive for arthralgias, back pain, gait problem, joint swelling and myalgias.  Skin: Negative.   Allergic/Immunologic: Negative.   Hematological: Negative.   Psychiatric/Behavioral: Negative.   All other systems reviewed and are negative.      Objective:   Physical Exam  Constitutional: She is oriented to person, place,  and time. She appears well-developed and well-nourished. No distress.  HENT:  Head: Normocephalic and atraumatic.  Eyes: Pupils are equal, round, and reactive to light. EOM are normal.  Neurological: She is alert and oriented to person, place, and  time. Gait abnormal.  5/5 in bilateral HF, KE, ADF Hypersensitive to touch at Left knee Uses cane to ambulate, no toe drag or knee instability  Skin: Skin is warm and dry. She is not diaphoretic.  Psychiatric: She has a normal mood and affect.  Nursing note and vitals reviewed.         Assessment & Plan:  1.  Lumbar spinal stenosis with chonic low back pain.  Pt states pain level is partially controlled by her Morphine IR 15mg  TID-  We discussed the importance of exercise and stretching, pt has [employer] but states she does like the water, we discussed stationary bicycling PMP aware website reviewed 05/06/18 Last UDS 03/14/18  We discussed Neuropsych referral for hx of memory problems as well as anxiety and depression/ adjustment ( no longer driving)

## 2018-05-06 NOTE — Patient Instructions (Signed)

## 2018-05-19 ENCOUNTER — Telehealth: Payer: Self-pay | Admitting: Physical Medicine & Rehabilitation

## 2018-05-19 ENCOUNTER — Telehealth: Payer: Self-pay | Admitting: Registered Nurse

## 2018-05-19 ENCOUNTER — Other Ambulatory Visit: Payer: Self-pay

## 2018-05-19 DIAGNOSIS — Z79899 Other long term (current) drug therapy: Secondary | ICD-10-CM

## 2018-05-19 DIAGNOSIS — N184 Chronic kidney disease, stage 4 (severe): Secondary | ICD-10-CM

## 2018-05-19 DIAGNOSIS — G894 Chronic pain syndrome: Secondary | ICD-10-CM

## 2018-05-19 DIAGNOSIS — Z5181 Encounter for therapeutic drug level monitoring: Secondary | ICD-10-CM

## 2018-05-19 MED ORDER — MORPHINE SULFATE 15 MG PO TABS: 15.0000 mg | ORAL_TABLET | Freq: Three times a day (TID) | ORAL | 0 refills | Status: DC

## 2018-05-19 NOTE — Telephone Encounter (Signed)
Patient called requesting refill of morphine MSIR 15mg  tabs.  Last refill was on 04-16-2018, last appointment was 05-06-2018.   Next appointment is on 05-29-2018.  States will be out of medication tomorrow.

## 2018-05-19 NOTE — Telephone Encounter (Signed)
Done per Zella Ball I'd like to see her in 1-2 months month

## 2018-05-19 NOTE — Telephone Encounter (Signed)
Placed a call to [name], PMP Aware Website was reviewed, last prescription was filled on 04/16/2018.  MSIR was e-scibe, she has a scheduled appointment on 05/29/2018. Placed a call to [name], she is aware of the above and verbalizes understanding.

## 2018-05-19 NOTE — Telephone Encounter (Signed)
Patient was in office last week and Paige Arnold doesn't have her refill for Morphine.  Paige Arnold is not sure if Dr. Letta Pate didn't send it to her pharmacy.  Paige Arnold will be out tomorrow.  Please call patient when this is done.

## 2018-05-21 ENCOUNTER — Ambulatory Visit: Payer: Medicare Other | Admitting: Vascular Surgery

## 2018-05-21 ENCOUNTER — Ambulatory Visit (HOSPITAL_COMMUNITY): Payer: Medicare Other

## 2018-05-29 ENCOUNTER — Encounter: Payer: No Typology Code available for payment source | Attending: Registered Nurse | Admitting: Registered Nurse

## 2018-05-29 ENCOUNTER — Encounter: Payer: Self-pay | Admitting: Registered Nurse

## 2018-05-29 VITALS — BP 175/76 | HR 68 | Ht 65.0 in | Wt 208.0 lb

## 2018-05-29 DIAGNOSIS — M25562 Pain in left knee: Secondary | ICD-10-CM | POA: Insufficient documentation

## 2018-05-29 DIAGNOSIS — Z96642 Presence of left artificial hip joint: Secondary | ICD-10-CM | POA: Diagnosis not present

## 2018-05-29 DIAGNOSIS — E079 Disorder of thyroid, unspecified: Secondary | ICD-10-CM | POA: Insufficient documentation

## 2018-05-29 DIAGNOSIS — G90522 Complex regional pain syndrome I of left lower limb: Secondary | ICD-10-CM | POA: Diagnosis not present

## 2018-05-29 DIAGNOSIS — Z833 Family history of diabetes mellitus: Secondary | ICD-10-CM | POA: Insufficient documentation

## 2018-05-29 DIAGNOSIS — M48061 Spinal stenosis, lumbar region without neurogenic claudication: Secondary | ICD-10-CM | POA: Diagnosis not present

## 2018-05-29 DIAGNOSIS — Z5181 Encounter for therapeutic drug level monitoring: Secondary | ICD-10-CM | POA: Diagnosis not present

## 2018-05-29 DIAGNOSIS — R51 Headache: Secondary | ICD-10-CM | POA: Insufficient documentation

## 2018-05-29 DIAGNOSIS — G8929 Other chronic pain: Secondary | ICD-10-CM | POA: Insufficient documentation

## 2018-05-29 DIAGNOSIS — N289 Disorder of kidney and ureter, unspecified: Secondary | ICD-10-CM | POA: Insufficient documentation

## 2018-05-29 DIAGNOSIS — I10 Essential (primary) hypertension: Secondary | ICD-10-CM | POA: Insufficient documentation

## 2018-05-29 DIAGNOSIS — Z820 Family history of epilepsy and other diseases of the nervous system: Secondary | ICD-10-CM | POA: Diagnosis not present

## 2018-05-29 DIAGNOSIS — M4800 Spinal stenosis, site unspecified: Secondary | ICD-10-CM | POA: Diagnosis not present

## 2018-05-29 DIAGNOSIS — Z8781 Personal history of (healed) traumatic fracture: Secondary | ICD-10-CM | POA: Diagnosis not present

## 2018-05-29 DIAGNOSIS — M545 Low back pain: Secondary | ICD-10-CM | POA: Insufficient documentation

## 2018-05-29 DIAGNOSIS — E1140 Type 2 diabetes mellitus with diabetic neuropathy, unspecified: Secondary | ICD-10-CM | POA: Insufficient documentation

## 2018-05-29 DIAGNOSIS — G894 Chronic pain syndrome: Secondary | ICD-10-CM | POA: Diagnosis not present

## 2018-05-29 DIAGNOSIS — G4730 Sleep apnea, unspecified: Secondary | ICD-10-CM | POA: Insufficient documentation

## 2018-05-29 DIAGNOSIS — Z96652 Presence of left artificial knee joint: Secondary | ICD-10-CM

## 2018-05-29 DIAGNOSIS — Z79899 Other long term (current) drug therapy: Secondary | ICD-10-CM | POA: Diagnosis not present

## 2018-05-29 DIAGNOSIS — Z8249 Family history of ischemic heart disease and other diseases of the circulatory system: Secondary | ICD-10-CM | POA: Diagnosis not present

## 2018-05-29 DIAGNOSIS — K219 Gastro-esophageal reflux disease without esophagitis: Secondary | ICD-10-CM | POA: Diagnosis not present

## 2018-05-29 DIAGNOSIS — M1990 Unspecified osteoarthritis, unspecified site: Secondary | ICD-10-CM | POA: Insufficient documentation

## 2018-05-29 DIAGNOSIS — Z841 Family history of disorders of kidney and ureter: Secondary | ICD-10-CM | POA: Insufficient documentation

## 2018-05-29 MED ORDER — MORPHINE SULFATE 15 MG PO TABS: 15.0000 mg | ORAL_TABLET | Freq: Three times a day (TID) | ORAL | 0 refills | Status: DC

## 2018-05-29 NOTE — Progress Notes (Signed)
Subjective:    Patient ID: Paige Arnold, female    DOB: March 30, 1941, 77 y.o.   MRN: [id number]  HPI: [name] is a 77 year old female who returns for follow up appointment for chronic pain and medication refill. She states her pain is located in her lower back and left knee. Also reports she has a headache. She arrived hypertensive and blood pressure was re-checked, she states she is compliant with her anti hypertensive medication. Her husband also states she is compliant with her medication. Placed a call to her daughter Paige Arnold regarding the above, no answer. Left message to return the call. Paige Arnold refuses Ed evaluation, she was instructed to purchase a blood pressure apparatus the one she has home she states doesn't work, she verbalizes understanding.   Paige Arnold Morphine Equivalent is 45.00 MME. Last Oral Swab was performed on 03/14/2018, it was consistent.   Pain Inventory Average Pain 3 Pain Right Now 3 My pain is sharp, burning, dull, stabbing and tingling  In the last 24 hours, has pain interfered with the following? General activity 2 Relation with others 2 Enjoyment of life 5 What TIME of day is your pain at its worst? all Sleep (in general) Fair  Pain is worse with: walking, bending, standing and some activites Pain improves with: rest, heat/ice, medication, TENS and injections Relief from Meds: 6  Mobility use a cane ability to climb steps?  no  Function retired I need assistance with the following:  meal prep, household duties and shopping  Neuro/Psych No problems in this area  Prior Studies Any changes since last visit?  no  Physicians involved in your care Any changes since last visit?  no   Family History  Problem Relation Age of Onset  . Kidney disease Mother   . Heart disease Father   . Diabetes Brother   . Alcohol abuse Brother   . Depression Brother   . Sarcoidosis Brother   . ALS Brother        Deceased, 66s  . Heart disease Brother     . Bipolar disorder Daughter    Social History   Socioeconomic History  . Marital status: Married    Spouse name: Not on file  . Number of children: 3  . Years of education: 15  . Highest education level: Not on file  Occupational History  . Occupation: retired  Scientific laboratory technician  . Financial resource strain: Not on file  . Food insecurity:    Worry: Not on file    Inability: Not on file  . Transportation needs:    Medical: Not on file    Non-medical: Not on file  Tobacco Use  . Smoking status: Never Smoker  . Smokeless tobacco: Never Used  Substance and Sexual Activity  . Alcohol use: No    Alcohol/week: 0.0 standard drinks  . Drug use: No  . Sexual activity: Yes  Lifestyle  . Physical activity:    Days per week: Not on file    Minutes per session: Not on file  . Stress: Not on file  Relationships  . Social connections:    Talks on phone: Not on file    Gets together: Not on file    Attends religious service: Not on file    Active member of club or organization: Not on file    Attends meetings of clubs or organizations: Not on file    Relationship status: Not on file  Other Topics Concern  .  Not on file  Social History Narrative   She lives with husband and daughter.  Three grown children.   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     She took early retirement 11 because of problems of RSD.   Highest level of education:  Graduated high school   Past Surgical History:  Procedure Laterality Date  . A/V FISTULAGRAM N/A 09/20/2017   Procedure: A/V FISTULAGRAM - left arm;  Surgeon: Angelia Mould, MD;  Location: [facility];  Service: Cardiovascular;  Laterality: N/A;  . AV FISTULA PLACEMENT Left 02/16/2015   Procedure: LEFT ARM BRACHIOCEPHALIC (AV) FISTULA CREATION;  Surgeon: Mal Misty, MD;  Location: [facility];  Service: Vascular;  Laterality: Left;  . CHOLECYSTECTOMY    . INTRAOCULAR LENS INSERTION Right 10-21-13  . LIGATION OF  COMPETING BRANCHES OF ARTERIOVENOUS FISTULA Left 02/16/2015   Procedure: LIGATION OF COMPETING BRANCHES OF LEFT BRACHIOCEPHALIC ARTERIOVENOUS FISTULA;  Surgeon: Mal Misty, MD;  Location: [place];  Service: Vascular;  Laterality: Left;  . PARTIAL HIP ARTHROPLASTY    . PARTIAL KNEE ARTHROPLASTY     left  . PERIPHERAL VASCULAR BALLOON ANGIOPLASTY Left 09/20/2017   Procedure: PERIPHERAL VASCULAR BALLOON ANGIOPLASTY;  Surgeon: Angelia Mould, MD;  Location: [facility];  Service: Cardiovascular;  Laterality: Left;  left fistulagram  . TEMPOROMANDIBULAR JOINT SURGERY    . TONSILLECTOMY    . TUBAL LIGATION     Past Medical History:  Diagnosis Date  . Arthritis   . Diabetes mellitus   . Diabetic neuropathy ([place])   . GERD (gastroesophageal reflux disease)   . Hypertension   . Kidney disease   . Pneumonia Oct. 2016  . Shortness of breath dyspnea    when I first lay down, then it gets better  . Sleep apnea    does not wear CPAP   . Thyroid disorder    There were no vitals taken for this visit.  Opioid Risk Score:   Fall Risk Score:  `1  Depression screen PHQ 2/9  Depression screen [facility] 2/9 04/10/2018 02/07/2018 10/08/2017 08/19/2017 07/19/2017 06/18/2017 05/16/2017  Decreased Interest 0 0 - 0 0 0 0  Down, Depressed, Hopeless 0 0 0 0 0 0 0  PHQ - 2 Score 0 0 0 0 0 0 0  Altered sleeping - - - - - - -  Tired, decreased energy - - - - - - -  Change in appetite - - - - - - -  Feeling bad or failure about yourself  - - - - - - -  Trouble concentrating - - - - - - -  Moving slowly or fidgety/restless - - - - - - -  Suicidal thoughts - - - - - - -  PHQ-9 Score - - - - - - -  Some recent data might be hidden     Review of Systems  Constitutional: Positive for unexpected weight change.  HENT: Negative.   Eyes: Negative.   Respiratory: Negative.   Cardiovascular: Negative.   Gastrointestinal: Negative.   Endocrine: Negative.   Genitourinary: Negative.   Musculoskeletal:  Positive for arthralgias, back pain, gait problem and myalgias.  Skin: Negative.   Allergic/Immunologic: Negative.   Hematological: Negative.   Psychiatric/Behavioral: Negative.   All other systems reviewed and are negative.      Objective:   Physical Exam  Constitutional: She is oriented to person, place, and time. She appears well-developed and well-nourished.  HENT:  Head: Normocephalic and atraumatic.  Neck: Normal range of motion. Neck supple.  Cardiovascular: Normal rate and regular rhythm.  Pulmonary/Chest: Effort normal and breath sounds normal.  Musculoskeletal:  Normal Muscle Bulk and Muscle Testing Reveals: Upper Extremities: Decreased ROM 90 DEgrees and Muscle Strength 4/5 Lumbar Paraspinal Tenderness: L-3-L-5 Lower Extremities: Decreased ROM and Muscle Strength 4/5 Bilateral Lower Extremities with Pitting edema 2+ Arises from Table slowly using cane for support Narrow Based Gait   Neurological: She is alert and oriented to person, place, and time.  Skin: Skin is warm and dry.  Psychiatric: She has a normal mood and affect. Her behavior is normal.  Nursing note and vitals reviewed.         Assessment & Plan:  1.Spinal stenosis chronic low back pain radiating into the legs:11/14//2019: Refilled:MSIR 15 mg take one tablet by mouththree times daily #90. We will continue the opioid monitoring program, this consists of regular clinic visits, examinations, urine drug screen, pill counts as well as use of [place] Controlled Substance Reporting System. 2. Diabetic Neuropathy: Continuecurrent medication regime withGabapentin.05/29/2018 3. Left Knee Pain: S/P Peripheral Nerve Block received 6-8 hours of relief.Continue Current Medication and exercise Regimen. Ms. Stiner refuses Cryoablation. ( RSD) Continue Lidoderm Patches.05/29/2018 4. Left Hip Fracture S/P hemiarthroplasty: Orthopedist Following.05/29/2018  20 minutes of face to face patient care time  was spent during this visit. All questions were encouraged and answered.  F/U in 1 month

## 2018-06-11 ENCOUNTER — Encounter: Payer: Self-pay | Admitting: Neurology

## 2018-06-11 ENCOUNTER — Ambulatory Visit: Payer: Medicare Other | Admitting: Neurology

## 2018-06-11 VITALS — BP 140/90 | HR 71 | Resp 16

## 2018-06-11 DIAGNOSIS — R413 Other amnesia: Secondary | ICD-10-CM | POA: Diagnosis not present

## 2018-06-11 DIAGNOSIS — G253 Myoclonus: Secondary | ICD-10-CM

## 2018-06-11 DIAGNOSIS — F0390 Unspecified dementia without behavioral disturbance: Secondary | ICD-10-CM | POA: Diagnosis not present

## 2018-06-11 DIAGNOSIS — G251 Drug-induced tremor: Secondary | ICD-10-CM

## 2018-06-11 DIAGNOSIS — N185 Chronic kidney disease, stage 5: Secondary | ICD-10-CM

## 2018-06-11 MED ORDER — PROPRANOLOL HCL 20 MG PO TABS: 20.0000 mg | ORAL_TABLET | Freq: Three times a day (TID) | ORAL | 3 refills | Status: DC

## 2018-06-11 NOTE — Progress Notes (Signed)
Follow-up Visit   Date: 06/11/18    Paige Arnold MRN: [id number] DOB: 07/27/1940   Interim History: Paige Arnold is a 77 y.o. right-handed Caucasian female with hypertension, diabetes mellitus, depression/anxiety, hypothyroidism, and chronic pain returning for follow-up of memory loss, neuropathy, and tremors. The patient was accompanied to the clinic by self.  She scheduled an acute visit for increased falls, generalized shakes, headaches and whole body pain.  She is accompanied by her daughter today who says symptoms started about 3 weeks ago.  She has been falling almost daily, there is no preceding weakness or pain.  Daughter feels that her falls are due to her increased jerks and shakes.  Patient cannot tell when she is going to fall and denies associated lightheadedness.  She always requires assistance to stand.  She has bruised her left hand and has not had it x-rayed.  She also tells me that she suffered a fall off the commode in our lobby bathroom.  She continues to have significant generalized pain, and it is difficult to determine what is new and old.  Patient does not feel she has had any fractures.  She also complains of bifrontal throbbing headaches which is alleviated with Tylenol for a few hours.  Upon further questioning, daughter tells me that she has stage V kidney disease and is being evaluated for possible hemodialysis.  Review of her medication list shows that she is taking gabapentin 300 mg daily, Cymbalta 60 mg daily, aspirin, and she continues to take ibuprofen as needed for headaches.      Medications:  Current Outpatient Medications on File Prior to Visit  Medication Sig Dispense Refill  . acetaminophen (TYLENOL) 500 MG tablet Take 1,000 mg by mouth every 6 (six) hours as needed for moderate pain or headache.    . Alum Hydroxide-Mag Trisilicate (GAVISCON) 69-48.5 MG CHEW Chew 1 tablet by mouth daily as needed (heartburn).    . cetirizine (ZYRTEC) 10 MG tablet  Take 10 mg by mouth daily.  0  . chlorhexidine (PERIDEX) 0.12 % solution SWISH WITH 1/2OZ FOR 30SECS, THEN SPIT OUT TWICE DAILY  9  . cloNIDine (CATAPRES) 0.1 MG tablet Take 0.1 mg by mouth daily.     Marland Kitchen dicyclomine (BENTYL) 10 MG capsule Take 10 mg by mouth daily.    Marland Kitchen donepezil (ARICEPT) 10 MG tablet Take 1 tablet (10 mg total) by mouth at bedtime. 90 tablet 3  . Dulaglutide (TRULICITY) 1.5 IO/2.7OJ SOPN Inject 1.5 mg into the skin every Sunday.    . DULoxetine (CYMBALTA) 60 MG capsule Take 60 mg by mouth daily.    . furosemide (LASIX) 80 MG tablet Take 80 mg by mouth 2 (two) times daily.     Marland Kitchen glimepiride (AMARYL) 4 MG tablet Take 4 mg by mouth daily with breakfast.    . levothyroxine (SYNTHROID, LEVOTHROID) 125 MCG tablet Take 125 mcg by mouth daily before breakfast.    . lidocaine (LIDODERM) 5 % Place 1 patch onto the skin daily. Remove & Discard patch within 12 hours or as directed by MD (Patient taking differently: Place 1 patch onto the skin daily as needed (pain). Remove & Discard patch within 12 hours or as directed by MD) 30 patch 1  . morphine (MSIR) 15 MG tablet Take 1 tablet (15 mg total) by mouth 3 (three) times daily. 90 tablet 0  . Multiple Vitamins-Minerals (CENTRUM SILVER PO) Take 1 tablet by mouth daily.    . Omega-3 Fatty Acids (FISH  OIL) 1000 MG CAPS Take 1,000 mg by mouth 2 (two) times daily.     Marland Kitchen OVER THE COUNTER MEDICATION Take 1 tablet by mouth daily. NeuRx TF    . pravastatin (PRAVACHOL) 40 MG tablet Take 40 mg by mouth at bedtime.     . ranitidine (ZANTAC) 150 MG tablet Take 150 mg by mouth 2 (two) times daily.    . vitamin B-12 (CYANOCOBALAMIN) 1000 MCG tablet Take 1,000 mcg by mouth daily.     No current facility-administered medications on file prior to visit.     Allergies:  Allergies  Allergen Reactions  . Furosemide Other (See Comments)    Malaise (intolerance)  . Sulfa Antibiotics Itching    Review of Systems:  CONSTITUTIONAL: No fevers, chills, night  sweats, or weight loss.  EYES: No visual changes or eye pain ENT: No hearing changes.  No history of nose bleeds.   RESPIRATORY: No cough, wheezing and shortness of breath.   CARDIOVASCULAR: Negative for chest pain, and palpitations.   GI: Negative for abdominal discomfort, blood in stools or black stools.  No recent change in bowel habits.   GU:  No history of incontinence.   MUSCLOSKELETAL: ++history of joint pain or swelling.  +myalgias.   SKIN: Negative for lesions, rash, and itching.   ENDOCRINE: Negative for cold or heat intolerance, polydipsia or goiter.   PSYCH:  + depression or anxiety symptoms.   NEURO: As Above.   Vital Signs:  BP 140/90   Pulse 71   Resp 16   SpO2 99%   General Medical Exam:   General:  Tired appearing, comfortable  Eyes/ENT: see cranial nerve examination.   Neck:   No carotid bruits. Respiratory:  Clear to auscultation, good air entry bilaterally.   Cardiac:  Regular rate and rhythm, no murmur.   Ext:  Left hand with edema and ecchymosis over the dorsal surface.  She has marked tenderness all over making physical exam testing difficult.  Neurological Exam: MENTAL STATUS including orientation to time, place, person, recent and remote memory, attention span and concentration, language, and fund of knowledge is fair.   Speech is not dysarthric.  Poor eye contact.   CRANIAL NERVES:  Pupils are round and reactive to light. Extraocular muscles are intact. Mild left ptosis Face is symmetric. Tongue is midline.  MOTOR:  Motor strength is 5/5 in all extremities, there is bilateral hand intention hand tremors.  I do not appreciate tremors of the legs or trunk.  There is no asterixis.   COORDINATION/GAIT:  Dysmetria with finger to nose testing (?effort).  She is able to stand supported with a cane and appears stable.   Data: Lab Results  Component Value Date   TSH 3.27 08/22/2017   Lab Results  Component Value Date   VITAMINB12 >1500 (H) 08/22/2017      Lab Results  Component Value Date   HGBA1C 6.3 (H) 03/09/2014   Labs 05/13/2018:  GFR 12, Cr. 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  Due to concern of medication-induced myoclonus, she was instructed to stop gabapentin.   I personally called her nephrologist's office and discussed patient's medications, including her use of gabapentin, ibuprofen and daily aspirin use, the latter two which are contraindicated in renal dysfunction.  I have reiterated to patient and her daughter that she needs to stop aspirin and NSAIDs. I have asked her to follow-up  with her nephrologist and review that her medications are all renally dosed.  I have stressed the importance that her daughter be present at the visit to ensure compliance with recommendations.  To be complete, MRI brain wo contrast will be ordered to further evaluate headaches, tremors, and falls.  Overall, my suspicion for CNS pathology is low and I believe her symptoms are stemming from medication side effects. Fall precautions were stressed  Tension headaches, I will increase propranolol to 20mg  three times daily which may also help with tremors.  No renal adjustment needed.   Left hand swelling and soft tissue injury s/p fall.  She will go to urgent care for x-ray of the hand to be sure there is no fracture.   Multifactorial cognitive impairment, contributed by depression and possible dementia.  OK to continue aricept 10mg  daily.  No renal adjustment needed.   Greater than 50% of this 45 minute visit was spent in counseling, explanation of diagnosis, planning of further management, and coordination of care.  Thank you for allowing me to participate in patient's care.  If I can answer any additional questions, I would be pleased to do so.    Sincerely,    Conlin Brahm K. Posey Pronto, DO

## 2018-06-11 NOTE — Patient Instructions (Addendum)
Stop gabapentin  Inbcrease proprnolol 20mg  three times daily  MRI brain without contrast  Stop aspirin  Avoid ibuprofen or naprosyn

## 2018-06-14 DIAGNOSIS — I214 Non-ST elevation (NSTEMI) myocardial infarction: Secondary | ICD-10-CM

## 2018-06-14 DIAGNOSIS — N179 Acute kidney failure, unspecified: Secondary | ICD-10-CM

## 2018-06-14 DIAGNOSIS — F0391 Unspecified dementia with behavioral disturbance: Secondary | ICD-10-CM

## 2018-06-14 DIAGNOSIS — N184 Chronic kidney disease, stage 4 (severe): Secondary | ICD-10-CM

## 2018-06-14 DIAGNOSIS — I509 Heart failure, unspecified: Secondary | ICD-10-CM | POA: Diagnosis not present

## 2018-06-14 DIAGNOSIS — I130 Hypertensive heart and chronic kidney disease with heart failure and stage 1 through stage 4 chronic kidney disease, or unspecified chronic kidney disease: Secondary | ICD-10-CM

## 2018-06-14 DIAGNOSIS — I674 Hypertensive encephalopathy: Secondary | ICD-10-CM | POA: Diagnosis not present

## 2018-06-14 DIAGNOSIS — G9340 Encephalopathy, unspecified: Secondary | ICD-10-CM

## 2018-06-14 DIAGNOSIS — I5031 Acute diastolic (congestive) heart failure: Secondary | ICD-10-CM

## 2018-06-14 DIAGNOSIS — I160 Hypertensive urgency: Secondary | ICD-10-CM

## 2018-06-15 ENCOUNTER — Other Ambulatory Visit: Payer: Self-pay

## 2018-06-15 ENCOUNTER — Inpatient Hospital Stay (HOSPITAL_COMMUNITY)
Admission: AD | Admit: 2018-06-15 | Discharge: 2018-06-25 | DRG: 981 | Disposition: A | Discharge status: Skilled Nursing Facility | Payer: Medicare Other | Source: Other Acute Inpatient Hospital | Attending: Internal Medicine | Admitting: Internal Medicine

## 2018-06-15 ENCOUNTER — Encounter (HOSPITAL_COMMUNITY): Payer: Self-pay

## 2018-06-15 ENCOUNTER — Inpatient Hospital Stay (HOSPITAL_COMMUNITY): Payer: Medicare Other

## 2018-06-15 DIAGNOSIS — N184 Chronic kidney disease, stage 4 (severe): Secondary | ICD-10-CM | POA: Diagnosis not present

## 2018-06-15 DIAGNOSIS — IMO0002 Reserved for concepts with insufficient information to code with codable children: Secondary | ICD-10-CM | POA: Diagnosis present

## 2018-06-15 DIAGNOSIS — N390 Urinary tract infection, site not specified: Secondary | ICD-10-CM | POA: Diagnosis not present

## 2018-06-15 DIAGNOSIS — R569 Unspecified convulsions: Secondary | ICD-10-CM | POA: Diagnosis present

## 2018-06-15 DIAGNOSIS — I5033 Acute on chronic diastolic (congestive) heart failure: Secondary | ICD-10-CM | POA: Diagnosis present

## 2018-06-15 DIAGNOSIS — E1165 Type 2 diabetes mellitus with hyperglycemia: Secondary | ICD-10-CM | POA: Diagnosis present

## 2018-06-15 DIAGNOSIS — I214 Non-ST elevation (NSTEMI) myocardial infarction: Secondary | ICD-10-CM | POA: Diagnosis present

## 2018-06-15 DIAGNOSIS — K5900 Constipation, unspecified: Secondary | ICD-10-CM | POA: Diagnosis not present

## 2018-06-15 DIAGNOSIS — G9341 Metabolic encephalopathy: Principal | ICD-10-CM | POA: Diagnosis present

## 2018-06-15 DIAGNOSIS — E039 Hypothyroidism, unspecified: Secondary | ICD-10-CM | POA: Diagnosis present

## 2018-06-15 DIAGNOSIS — Z961 Presence of intraocular lens: Secondary | ICD-10-CM | POA: Diagnosis present

## 2018-06-15 DIAGNOSIS — G4733 Obstructive sleep apnea (adult) (pediatric): Secondary | ICD-10-CM | POA: Diagnosis present

## 2018-06-15 DIAGNOSIS — G4730 Sleep apnea, unspecified: Secondary | ICD-10-CM | POA: Diagnosis not present

## 2018-06-15 DIAGNOSIS — Z8249 Family history of ischemic heart disease and other diseases of the circulatory system: Secondary | ICD-10-CM

## 2018-06-15 DIAGNOSIS — I132 Hypertensive heart and chronic kidney disease with heart failure and with stage 5 chronic kidney disease, or end stage renal disease: Secondary | ICD-10-CM | POA: Diagnosis present

## 2018-06-15 DIAGNOSIS — R29702 NIHSS score 2: Secondary | ICD-10-CM | POA: Diagnosis present

## 2018-06-15 DIAGNOSIS — I639 Cerebral infarction, unspecified: Secondary | ICD-10-CM | POA: Diagnosis not present

## 2018-06-15 DIAGNOSIS — E785 Hyperlipidemia, unspecified: Secondary | ICD-10-CM | POA: Diagnosis not present

## 2018-06-15 DIAGNOSIS — G894 Chronic pain syndrome: Secondary | ICD-10-CM | POA: Diagnosis present

## 2018-06-15 DIAGNOSIS — R001 Bradycardia, unspecified: Secondary | ICD-10-CM | POA: Diagnosis not present

## 2018-06-15 DIAGNOSIS — Z882 Allergy status to sulfonamides status: Secondary | ICD-10-CM

## 2018-06-15 DIAGNOSIS — E1151 Type 2 diabetes mellitus with diabetic peripheral angiopathy without gangrene: Secondary | ICD-10-CM | POA: Diagnosis not present

## 2018-06-15 DIAGNOSIS — K219 Gastro-esophageal reflux disease without esophagitis: Secondary | ICD-10-CM | POA: Diagnosis not present

## 2018-06-15 DIAGNOSIS — N185 Chronic kidney disease, stage 5: Secondary | ICD-10-CM | POA: Diagnosis present

## 2018-06-15 DIAGNOSIS — Z96642 Presence of left artificial hip joint: Secondary | ICD-10-CM | POA: Diagnosis present

## 2018-06-15 DIAGNOSIS — I160 Hypertensive urgency: Secondary | ICD-10-CM | POA: Diagnosis not present

## 2018-06-15 DIAGNOSIS — E876 Hypokalemia: Secondary | ICD-10-CM | POA: Diagnosis present

## 2018-06-15 DIAGNOSIS — E118 Type 2 diabetes mellitus with unspecified complications: Secondary | ICD-10-CM | POA: Diagnosis not present

## 2018-06-15 DIAGNOSIS — Z833 Family history of diabetes mellitus: Secondary | ICD-10-CM

## 2018-06-15 DIAGNOSIS — F40240 Claustrophobia: Secondary | ICD-10-CM | POA: Diagnosis present

## 2018-06-15 DIAGNOSIS — R140 Abdominal distension (gaseous): Secondary | ICD-10-CM

## 2018-06-15 DIAGNOSIS — E1140 Type 2 diabetes mellitus with diabetic neuropathy, unspecified: Secondary | ICD-10-CM | POA: Diagnosis present

## 2018-06-15 DIAGNOSIS — B952 Enterococcus as the cause of diseases classified elsewhere: Secondary | ICD-10-CM | POA: Diagnosis not present

## 2018-06-15 DIAGNOSIS — F0391 Unspecified dementia with behavioral disturbance: Secondary | ICD-10-CM | POA: Diagnosis present

## 2018-06-15 DIAGNOSIS — M1990 Unspecified osteoarthritis, unspecified site: Secondary | ICD-10-CM | POA: Diagnosis not present

## 2018-06-15 DIAGNOSIS — G8194 Hemiplegia, unspecified affecting left nondominant side: Secondary | ICD-10-CM | POA: Diagnosis present

## 2018-06-15 DIAGNOSIS — I674 Hypertensive encephalopathy: Secondary | ICD-10-CM | POA: Diagnosis not present

## 2018-06-15 DIAGNOSIS — I6349 Cerebral infarction due to embolism of other cerebral artery: Secondary | ICD-10-CM | POA: Diagnosis present

## 2018-06-15 DIAGNOSIS — Z96652 Presence of left artificial knee joint: Secondary | ICD-10-CM | POA: Diagnosis present

## 2018-06-15 DIAGNOSIS — R296 Repeated falls: Secondary | ICD-10-CM | POA: Diagnosis present

## 2018-06-15 DIAGNOSIS — Z7984 Long term (current) use of oral hypoglycemic drugs: Secondary | ICD-10-CM

## 2018-06-15 DIAGNOSIS — Z01810 Encounter for preprocedural cardiovascular examination: Secondary | ICD-10-CM | POA: Diagnosis not present

## 2018-06-15 DIAGNOSIS — Z9049 Acquired absence of other specified parts of digestive tract: Secondary | ICD-10-CM

## 2018-06-15 DIAGNOSIS — E1122 Type 2 diabetes mellitus with diabetic chronic kidney disease: Secondary | ICD-10-CM | POA: Diagnosis present

## 2018-06-15 DIAGNOSIS — G253 Myoclonus: Secondary | ICD-10-CM | POA: Diagnosis not present

## 2018-06-15 DIAGNOSIS — G9340 Encephalopathy, unspecified: Secondary | ICD-10-CM | POA: Diagnosis not present

## 2018-06-15 DIAGNOSIS — Z841 Family history of disorders of kidney and ureter: Secondary | ICD-10-CM

## 2018-06-15 DIAGNOSIS — Z888 Allergy status to other drugs, medicaments and biological substances status: Secondary | ICD-10-CM

## 2018-06-15 DIAGNOSIS — Z79899 Other long term (current) drug therapy: Secondary | ICD-10-CM

## 2018-06-15 DIAGNOSIS — N179 Acute kidney failure, unspecified: Secondary | ICD-10-CM | POA: Diagnosis present

## 2018-06-15 DIAGNOSIS — I6789 Other cerebrovascular disease: Secondary | ICD-10-CM | POA: Diagnosis not present

## 2018-06-15 DIAGNOSIS — F419 Anxiety disorder, unspecified: Secondary | ICD-10-CM

## 2018-06-15 DIAGNOSIS — I5031 Acute diastolic (congestive) heart failure: Secondary | ICD-10-CM | POA: Diagnosis not present

## 2018-06-15 DIAGNOSIS — Z79890 Hormone replacement therapy (postmenopausal): Secondary | ICD-10-CM

## 2018-06-15 LAB — COMPREHENSIVE METABOLIC PANEL
ALT: 23 U/L (ref 0–44)
AST: 31 U/L (ref 15–41)
Albumin: 3.3 g/dL — ABNORMAL LOW (ref 3.5–5.0)
Alkaline Phosphatase: 76 U/L (ref 38–126)
Anion gap: 17 — ABNORMAL HIGH (ref 5–15)
BUN: 66 mg/dL — ABNORMAL HIGH (ref 8–23)
CO2: 25 mmol/L (ref 22–32)
Calcium: 9.4 mg/dL (ref 8.9–10.3)
Chloride: 100 mmol/L (ref 98–111)
Creatinine, Ser: 4.59 mg/dL — ABNORMAL HIGH (ref 0.44–1.00)
GFR calc Af Amer: 10 mL/min — ABNORMAL LOW (ref >60)
GFR calc non Af Amer: 9 mL/min — ABNORMAL LOW (ref >60)
Glucose, Bld: 198 mg/dL — ABNORMAL HIGH (ref 70–99)
Potassium: 3.7 mmol/L (ref 3.5–5.1)
Sodium: 142 mmol/L (ref 135–145)
Total Bilirubin: 1.1 mg/dL (ref 0.3–1.2)
Total Protein: 7.3 g/dL (ref 6.5–8.1)

## 2018-06-15 LAB — CBC
HCT: 40.9 % (ref 36.0–46.0)
Hemoglobin: 12.4 g/dL (ref 12.0–15.0)
MCH: 29.2 pg (ref 26.0–34.0)
MCHC: 30.3 g/dL (ref 30.0–36.0)
MCV: 96.5 fL (ref 80.0–100.0)
PLATELETS: 396 10*3/uL (ref 150–400)
RBC: 4.24 MIL/uL (ref 3.87–5.11)
RDW: 13.8 % (ref 11.5–15.5)
WBC: 23.3 10*3/uL — ABNORMAL HIGH (ref 4.0–10.5)
nRBC: 0 % (ref 0.0–0.2)

## 2018-06-15 LAB — ACETAMINOPHEN LEVEL: Acetaminophen (Tylenol), Serum: <10 ug/mL — ABNORMAL LOW (ref 10–30)

## 2018-06-15 LAB — MRSA PCR SCREENING: MRSA by PCR: NEGATIVE

## 2018-06-15 LAB — TROPONIN I: Troponin I: 0.33 ng/mL (ref <0.03)

## 2018-06-15 MED ORDER — STROKE: EARLY STAGES OF RECOVERY BOOK
Freq: Once | Status: DC
  Filled 2018-06-15: qty 1

## 2018-06-15 MED ORDER — SODIUM CHLORIDE 0.9% FLUSH
3.0000 mL | Freq: Two times a day (BID) | INTRAVENOUS | Status: DC
  Administered 2018-06-16 – 2018-06-25 (×19): 3 mL via INTRAVENOUS

## 2018-06-15 MED ORDER — DICYCLOMINE HCL 10 MG PO CAPS
10.0000 mg | ORAL_CAPSULE | Freq: Every day | ORAL | Status: DC
  Filled 2018-06-15: qty 1

## 2018-06-15 MED ORDER — CLONIDINE HCL 0.1 MG PO TABS: 0.1000 mg | ORAL_TABLET | Freq: Every day | ORAL | Status: DC

## 2018-06-15 MED ORDER — ACETAMINOPHEN 160 MG/5ML PO SOLN: 650.0000 mg | ORAL | Status: DC | PRN

## 2018-06-15 MED ORDER — HEPARIN SODIUM (PORCINE) 5000 UNIT/ML IJ SOLN: 5000.0000 [IU] | Freq: Three times a day (TID) | INTRAMUSCULAR | Status: DC

## 2018-06-15 MED ORDER — LORATADINE 10 MG PO TABS: 10.0000 mg | ORAL_TABLET | Freq: Every day | ORAL | Status: DC

## 2018-06-15 MED ORDER — LORAZEPAM 2 MG/ML IJ SOLN
1.0000 mg | INTRAMUSCULAR | Status: DC | PRN
  Administered 2018-06-15 – 2018-06-16 (×3): 1 mg via INTRAVENOUS
  Filled 2018-06-15 (×3): qty 1

## 2018-06-15 MED ORDER — INSULIN ASPART 100 UNIT/ML ~~LOC~~ SOLN
0.0000 [IU] | Freq: Three times a day (TID) | SUBCUTANEOUS | Status: DC
  Administered 2018-06-16: 5 [IU] via SUBCUTANEOUS

## 2018-06-15 MED ORDER — FUROSEMIDE 10 MG/ML IJ SOLN
40.0000 mg | Freq: Two times a day (BID) | INTRAMUSCULAR | Status: DC
  Administered 2018-06-16: 40 mg via INTRAVENOUS
  Filled 2018-06-15: qty 4

## 2018-06-15 MED ORDER — SODIUM CHLORIDE 0.9 % IV SOLN
INTRAVENOUS | Status: DC
  Administered 2018-06-15: 23:00:00 via INTRAVENOUS

## 2018-06-15 MED ORDER — NITROGLYCERIN 2 % TD OINT
1.0000 [in_us] | TOPICAL_OINTMENT | Freq: Four times a day (QID) | TRANSDERMAL | Status: DC
  Administered 2018-06-16 – 2018-06-18 (×10): 1 [in_us] via TOPICAL
  Filled 2018-06-15: qty 30

## 2018-06-15 MED ORDER — PROPRANOLOL HCL 20 MG PO TABS
20.0000 mg | ORAL_TABLET | Freq: Three times a day (TID) | ORAL | Status: DC
  Filled 2018-06-15 (×2): qty 1

## 2018-06-15 MED ORDER — LABETALOL HCL 5 MG/ML IV SOLN
5.0000 mg | INTRAVENOUS | Status: DC | PRN
  Administered 2018-06-16 (×9): 5 mg via INTRAVENOUS
  Filled 2018-06-15 (×3): qty 4

## 2018-06-15 MED ORDER — ASPIRIN 325 MG PO TABS: 325.0000 mg | ORAL_TABLET | Freq: Every day | ORAL | Status: DC

## 2018-06-15 MED ORDER — DULOXETINE HCL 60 MG PO CPEP: 60.0000 mg | ORAL_CAPSULE | Freq: Every day | ORAL | Status: DC

## 2018-06-15 MED ORDER — ACETAMINOPHEN 650 MG RE SUPP: 650.0000 mg | RECTAL | Status: DC | PRN

## 2018-06-15 MED ORDER — LEVOTHYROXINE SODIUM 25 MCG PO TABS: 125.0000 ug | ORAL_TABLET | ORAL | Status: DC

## 2018-06-15 MED ORDER — HEPARIN (PORCINE) 25000 UT/250ML-% IV SOLN
750.0000 [IU]/h | INTRAVENOUS | Status: DC
  Administered 2018-06-15: 800 [IU]/h via INTRAVENOUS
  Filled 2018-06-15: qty 250

## 2018-06-15 MED ORDER — ASPIRIN 300 MG RE SUPP
300.0000 mg | Freq: Every day | RECTAL | Status: DC
  Administered 2018-06-15 – 2018-06-17 (×3): 300 mg via RECTAL
  Filled 2018-06-15 (×4): qty 1

## 2018-06-15 MED ORDER — LEVOTHYROXINE SODIUM 100 MCG IV SOLR
75.0000 ug | Freq: Every day | INTRAVENOUS | Status: DC
  Administered 2018-06-16 – 2018-06-18 (×3): 75 ug via INTRAVENOUS
  Filled 2018-06-15 (×3): qty 5

## 2018-06-15 MED ORDER — MORPHINE SULFATE (PF) 2 MG/ML IV SOLN
2.0000 mg | INTRAVENOUS | Status: DC | PRN
  Administered 2018-06-15 – 2018-06-16 (×3): 2 mg via INTRAVENOUS
  Filled 2018-06-15 (×3): qty 1

## 2018-06-15 MED ORDER — DONEPEZIL HCL 10 MG PO TABS
10.0000 mg | ORAL_TABLET | Freq: Every day | ORAL | Status: DC
  Administered 2018-06-16 – 2018-06-24 (×8): 10 mg via ORAL
  Filled 2018-06-15 (×11): qty 1

## 2018-06-15 MED ORDER — ACETAMINOPHEN 325 MG PO TABS: 650.0000 mg | ORAL_TABLET | ORAL | Status: DC | PRN

## 2018-06-15 MED ORDER — INSULIN ASPART 100 UNIT/ML ~~LOC~~ SOLN: 0.0000 [IU] | Freq: Every day | SUBCUTANEOUS | Status: DC

## 2018-06-15 MED ORDER — SODIUM CHLORIDE 0.9% FLUSH: 3.0000 mL | INTRAVENOUS | Status: DC | PRN

## 2018-06-15 MED ORDER — GLIMEPIRIDE 4 MG PO TABS
4.0000 mg | ORAL_TABLET | Freq: Every day | ORAL | Status: DC
  Filled 2018-06-15: qty 1

## 2018-06-15 NOTE — H&P (Signed)
History and Physical   Paige Arnold DXI:[id number] DOB: [birth year] DOA: 06/15/2018  Referring MD/NP/PA: Dr. Gevena Barre from [place]  PCP: Angelina Sheriff, MD   Patient coming from: [facility]  Chief Complaint: Altered mental status  HPI: Paige Arnold is a 77 y.o. female with medical history significant of chronic kidney disease stage IV, hypertension, diabetes, morbid obesity, GERD, osteoarthritis and chronic pain who has had progressive mild jerky movements concerning for possible seizures over the last few weeks.  Typically these are followed by a 30-minute stage of general confusion.  She was seen by her physical primary care physician for review a few days earlier.  He did recommend an MRI to be done but in the process patient has continued to receive medications and her symptoms have gotten worse.  Blood pressure was well controlled.  But went to the ER yesterday where she was agitated and confused.  She was seen for evaluation and in the emergency room she was noted to have blood pressure with systolic about 767.  She was given a dose of hydralazine but no effect.  Patient also was given IV Ativan Haldol which helped her mentation.  CT scan was done which was normal.  No evidence of focal neurologic deficit troponin was 0.07 at the time.  Her creatinine was 4.6 with GFR of 90 at the time.  Her baseline creatinine was 3.6 with GFR of 12 only about a month ago.  Patient also had elevated BNP is 3780 was given Cardene drip initially.  Troponin steadily rose to 0.3 at 0.9.  Cardiology was consulted who recommended medical management with heparin and nitroglycerin for possible non-ST elevation MI.  But patient remained confused.  More agitated.  On arrival in the colon she was still somnolent.  Her creatinine has gotten worse down to although all the way to 5.0.  Nephrology was consulted and patient was transferred to call for treatment..  ED Course: See above for findings at  [facility]  Review of Systems: As per HPI otherwise 10 point review of systems negative.    Past Medical History:  Diagnosis Date  . Arthritis   . Diabetes mellitus   . Diabetic neuropathy ([place])   . GERD (gastroesophageal reflux disease)   . Hypertension   . Kidney disease   . Pneumonia Oct. 2016  . Shortness of breath dyspnea    when I first lay down, then it gets better  . Sleep apnea    does not wear CPAP   . Thyroid disorder     Past Surgical History:  Procedure Laterality Date  . A/V FISTULAGRAM N/A 09/20/2017   Procedure: A/V FISTULAGRAM - left arm;  Surgeon: Angelia Mould, MD;  Location: [place] [facility];  Service: Cardiovascular;  Laterality: N/A;  . AV FISTULA PLACEMENT Left 02/16/2015   Procedure: LEFT ARM BRACHIOCEPHALIC (AV) FISTULA CREATION;  Surgeon: Mal Misty, MD;  Location: [place];  Service: Vascular;  Laterality: Left;  . CHOLECYSTECTOMY    . INTRAOCULAR LENS INSERTION Right 10-21-13  . LIGATION OF COMPETING BRANCHES OF ARTERIOVENOUS FISTULA Left 02/16/2015   Procedure: LIGATION OF COMPETING BRANCHES OF LEFT BRACHIOCEPHALIC ARTERIOVENOUS FISTULA;  Surgeon: Mal Misty, MD;  Location: [place];  Service: Vascular;  Laterality: Left;  . PARTIAL HIP ARTHROPLASTY    . PARTIAL KNEE ARTHROPLASTY     left  . PERIPHERAL VASCULAR BALLOON ANGIOPLASTY Left 09/20/2017   Procedure: PERIPHERAL VASCULAR BALLOON ANGIOPLASTY;  Surgeon: Angelia Mould, MD;  Location: [place] [facility];  Service: Cardiovascular;  Laterality: Left;  left fistulagram  . TEMPOROMANDIBULAR JOINT SURGERY    . TONSILLECTOMY    . TUBAL LIGATION       reports that she has never smoked. She has never used smokeless tobacco. She reports that she does not drink alcohol or use drugs.  Allergies  Allergen Reactions  . Furosemide Other (See Comments)    Malaise (intolerance)  . Sulfa Antibiotics Itching    Family History  Problem Relation Age of Onset  . Kidney disease Mother   .  Heart disease Father   . Diabetes Brother   . Alcohol abuse Brother   . Depression Brother   . Sarcoidosis Brother   . ALS Brother        Deceased, 2s  . Heart disease Brother   . Bipolar disorder Daughter      Prior to Admission medications   Medication Sig Start Date End Date Taking? Authorizing Provider  acetaminophen (TYLENOL) 500 MG tablet Take 1,000 mg by mouth every 6 (six) hours as needed for moderate pain or headache.    [provider]  Alum Hydroxide-Mag Trisilicate (GAVISCON) 11-57.2 MG CHEW Chew 1 tablet by mouth daily as needed (heartburn).    [provider]  cetirizine (ZYRTEC) 10 MG tablet Take 10 mg by mouth daily. 05/25/18   [provider]  chlorhexidine (PERIDEX) 0.12 % solution SWISH WITH 1/2OZ FOR 30SECS, THEN SPIT OUT TWICE DAILY 08/28/17   [provider]  cloNIDine (CATAPRES) 0.1 MG tablet Take 0.1 mg by mouth daily.     [provider]  dicyclomine (BENTYL) 10 MG capsule Take 10 mg by mouth daily.    [provider]  donepezil (ARICEPT) 10 MG tablet Take 1 tablet (10 mg total) by mouth at bedtime. 04/21/18   Narda Amber K, DO  Dulaglutide (TRULICITY) 1.5 IO/0.3TD SOPN Inject 1.5 mg into the skin every Sunday.    [provider]  DULoxetine (CYMBALTA) 60 MG capsule Take 60 mg by mouth daily.    [provider]  furosemide (LASIX) 80 MG tablet Take 80 mg by mouth 2 (two) times daily.     [provider]  glimepiride (AMARYL) 4 MG tablet Take 4 mg by mouth daily with breakfast.    [provider]  levothyroxine (SYNTHROID, LEVOTHROID) 125 MCG tablet Take 125 mcg by mouth daily before breakfast.    [provider]  lidocaine (LIDODERM) 5 % Place 1 patch onto the skin daily. Remove & Discard patch within 12 hours or as directed by MD Patient taking differently: Place 1 patch onto the skin daily as needed (pain). Remove & Discard patch within 12 hours or as directed by  MD 04/16/17   Kirsteins, Luanna Salk, MD  morphine (MSIR) 15 MG tablet Take 1 tablet (15 mg total) by mouth 3 (three) times daily. 05/29/18   Bayard Hugger, NP  Multiple Vitamins-Minerals (CENTRUM SILVER PO) Take 1 tablet by mouth daily.    [provider]  Omega-3 Fatty Acids (FISH OIL) 1000 MG CAPS Take 1,000 mg by mouth 2 (two) times daily.     [provider]  OVER THE COUNTER MEDICATION Take 1 tablet by mouth daily. NeuRx TF    [provider]  pravastatin (PRAVACHOL) 40 MG tablet Take 40 mg by mouth at bedtime.     [provider]  propranolol (INDERAL) 20 MG tablet Take 1 tablet (20 mg total) by mouth 3 (  three) times daily. 06/11/18   Patel, Arvin Collard K, DO  ranitidine (ZANTAC) 150 MG tablet Take 150 mg by mouth 2 (two) times daily.    [provider]  vitamin B-12 (CYANOCOBALAMIN) 1000 MCG tablet Take 1,000 mcg by mouth daily.    [provider]    Physical Exam: Vitals:   06/15/18 2000 06/15/18 2003 06/15/18 2232 06/15/18 2300  BP: (!) 173/77 (!) 162/74 (!) 185/100 (!) 194/112  Pulse: 77 77 (!) 101 (!) 59  Resp: 13 15 18 17   Temp: (!) 97.1 F (36.2 C)     TempSrc: Axillary     SpO2: 98% 95% 99% 94%  Weight:      Height:          Constitutional: NAD, somnolent, family at bedside Vitals:   06/15/18 2000 06/15/18 2003 06/15/18 2232 06/15/18 2300  BP: (!) 173/77 (!) 162/74 (!) 185/100 (!) 194/112  Pulse: 77 77 (!) 101 (!) 59  Resp: 13 15 18 17   Temp: (!) 97.1 F (36.2 C)     TempSrc: Axillary     SpO2: 98% 95% 99% 94%  Weight:      Height:       Eyes: PERRL, lids and conjunctivae normal ENMT: Mucous membranes are moist. Posterior pharynx clear of any exudate or lesions.Normal dentition.  Neck: normal, supple, no masses, no thyromegaly Respiratory: clear to auscultation bilaterally, no wheezing, no crackles. Normal respiratory effort. No accessory muscle use.  Cardiovascular: Regular rate and rhythm, no murmurs / rubs /  gallops. No extremity edema. 2+ pedal pulses. No carotid bruits.  Abdomen: no tenderness, no masses palpated. No hepatosplenomegaly. Bowel sounds positive.  Musculoskeletal: no clubbing / cyanosis. No joint deformity upper and lower extremities. Good ROM, no contractures. Normal muscle tone.  Skin: no rashes, lesions, ulcers. No induration Neurologic: CN 2-12 grossly intact. Sensation intact, DTR normal. Strength 5/5 in all 4.  Psychiatric: Somnolent, agitated while awaken    Labs on Admission: I have personally reviewed following labs and imaging studies  CBC: Recent Labs  Lab 06/15/18 1958  WBC 23.3*  HGB 12.4  HCT 40.9  MCV 96.5  PLT 606   Basic Metabolic Panel: Recent Labs  Lab 06/15/18 1958  NA 142  K 3.7  CL 100  CO2 25  GLUCOSE 198*  BUN 66*  CREATININE 4.59*  CALCIUM 9.4   GFR: Estimated Creatinine Clearance: 11.4 mL/min (A) (by C-G formula based on SCr of 4.59 mg/dL (H)). Liver Function Tests: Recent Labs  Lab 06/15/18 1958  AST 31  ALT 23  ALKPHOS 76  BILITOT 1.1  PROT 7.3  ALBUMIN 3.3*   No results for input(s): LIPASE, AMYLASE in the last 168 hours. No results for input(s): AMMONIA in the last 168 hours. Coagulation Profile: No results for input(s): INR, PROTIME in the last 168 hours. Cardiac Enzymes: Recent Labs  Lab 06/15/18 1958  TROPONINI 0.33*   BNP (last 3 results) No results for input(s): PROBNP in the last 8760 hours. HbA1C: No results for input(s): HGBA1C in the last 72 hours. CBG: No results for input(s): GLUCAP in the last 168 hours. Lipid Profile: No results for input(s): CHOL, HDL, LDLCALC, TRIG, CHOLHDL, LDLDIRECT in the last 72 hours. Thyroid Function Tests: No results for input(s): TSH, T4TOTAL, FREET4, T3FREE, THYROIDAB in the last 72 hours. Anemia Panel: No results for input(s): VITAMINB12, FOLATE, FERRITIN, TIBC, IRON, RETICCTPCT in the last 72 hours. Urine analysis:    Component Value Date/Time   COLORURINE YELLOW  03/09/2014 1331   APPEARANCEUR CLEAR 03/09/2014 1331   LABSPEC 1.009 03/09/2014 1331   PHURINE 7.5 03/09/2014 1331   GLUCOSEU NEGATIVE 03/09/2014 1331   HGBUR NEGATIVE 03/09/2014 1331   BILIRUBINUR NEGATIVE 03/09/2014 1331   KETONESUR NEGATIVE 03/09/2014 1331   PROTEINUR 30 (A) 03/09/2014 1331   UROBILINOGEN 0.2 03/09/2014 1331   NITRITE NEGATIVE 03/09/2014 1331   LEUKOCYTESUR MODERATE (A) 03/09/2014 1331   Sepsis Labs: @LABRCNTIP (procalcitonin:4,lacticidven:4) ) Recent Results (from the past 240 hour(s))  MRSA PCR Screening     Status: None   Collection Time: 06/15/18  8:00 PM  Result Value Ref Range Status   MRSA by PCR NEGATIVE NEGATIVE Final    Comment:        The GeneXpert MRSA Assay (FDA approved for NASAL specimens only), is one component of a comprehensive MRSA colonization surveillance program. It is not intended to diagnose MRSA infection nor to guide or monitor treatment for MRSA infections. Performed at [facility], [place] [address] [place], [place] [address]      Radiological Exams on Admission: No results found.  Assessment/Plan Principal Problem:   Metabolic encephalopathy Active Problems:   Diabetes mellitus type 2 with complications, uncontrolled (HCC)   Chronic kidney disease (CKD), stage IV (severe) (HCC)   Hypertensive urgency   Hypothyroidism   Acute metabolic encephalopathy   NSTEMI (non-ST elevated myocardial infarction) ([place])     #1 non-ST elevation MI: Troponin apparently has been trending downwards prior to transfer from [place].  Patient is currently in stepdown unit.  We will recycle troponins.  Recheck EKG.  Consider cardiology consultation in the morning.  Continue with IV heparin.  Nitroglycerin as needed.  Has had echocardiogram at [facility] showing moderate concentric left left ventricular hypertrophy normal global left ventricular contractions with normal EF.  #2 acute metabolic encephalopathy: most likely secondary to  medical reasons.  At this point patient will get MRI of the brain.  Monitor her response.  Neurology will be consulted if anything is abnormal.  #3 hypertensive urgency: Blood pressure seems to have improved currently stable at this point.  Not on any IV drip.  We will start her home medications and PRN labetalol.  #4 acute on chronic kidney failure: With her creatinine rising and GFR dropping nephrology has been consulted.  We will follow their recommendations.  #5 diabetes: Blood sugar is controlled.  Start sliding scale insulin with home regimen.  #6 elevated TSH: apparently TSH was elevated at 8.77 but free T4 was normal and T3 slightly low.  No change in current treatment.  #7 acute diastolic heart failure: again monitor closely.  Diuresis as needed.  #8 reported Tylenol overuse: Family reported patient has use more Tylenol recently.  Will check Tylenol level as gain monitor closely.    DVT prophylaxis: Heparin Code Status: Full code Family Communication: Daughter husband and son at bedside Disposition Plan: To be determined Consults called: Nephrology Dr. Lorrin Jackson Admission status: Inpatient  Severity of Illness: The appropriate patient status for this patient is INPATIENT. Inpatient status is judged to be reasonable and necessary in order to provide the required intensity of service to ensure the patient's safety. The patient's presenting symptoms, physical exam findings, and initial radiographic and laboratory data in the context of their chronic comorbidities is felt to place them at high risk for further clinical deterioration. Furthermore, it is not anticipated that the patient will be medically stable for discharge from the hospital within 2 midnights of admission. The following factors support  the patient status of inpatient.   " The patient's presenting symptoms include altered mental status. " The worrisome physical exam findings include confusion. " The initial radiographic  and laboratory data are worrisome because of normal findings at [facility]. " The chronic co-morbidities include diabetes hypertension.   * I certify that at the point of admission it is my clinical judgment that the patient will require inpatient hospital care spanning beyond 2 midnights from the point of admission due to high intensity of service, high risk for further deterioration and high frequency of surveillance required.Barbette Merino MD [facility] Pager [phone]  If 7PM-7AM, please contact night-coverage [email or web address] Password [facility]  06/15/2018, 11:46 PM

## 2018-06-15 NOTE — Progress Notes (Signed)
MRI attempted pt will not hold still

## 2018-06-15 NOTE — Progress Notes (Signed)
ANTICOAGULATION CONSULT NOTE - Initial Consult  Pharmacy Consult for heparin Indication: chest pain/ACS  Allergies  Allergen Reactions  . Furosemide Other (See Comments)    Malaise (intolerance)  . Sulfa Antibiotics Itching    Patient Measurements: Height: 5\' 5"  (165.1 cm) Weight: 192 lb 3.9 oz (87.2 kg) IBW/kg (Calculated) : 57   Vital Signs: Temp: 97.9 F (36.6 C) (12/01 1800) Temp Source: Oral (12/01 1800) BP: 162/74 (12/01 2003) Pulse Rate: 77 (12/01 2003)  Labs: Recent Labs    06/15/18 1958  HGB 12.4  HCT 40.9  PLT 396    CrCl cannot be calculated (Patient's most recent lab result is older than the maximum 21 days allowed.).   Medical History: Past Medical History:  Diagnosis Date  . Arthritis   . Diabetes mellitus   . Diabetic neuropathy ([place])   . GERD (gastroesophageal reflux disease)   . Hypertension   . Kidney disease   . Pneumonia Oct. 2016  . Shortness of breath dyspnea    when I first lay down, then it gets better  . Sleep apnea    does not wear CPAP   . Thyroid disorder     Medications:  Medications Prior to Admission  Medication Sig Dispense Refill Last Dose  . acetaminophen (TYLENOL) 500 MG tablet Take 1,000 mg by mouth every 6 (six) hours as needed for moderate pain or headache.   Taking  . Alum Hydroxide-Mag Trisilicate (GAVISCON) 00-93.8 MG CHEW Chew 1 tablet by mouth daily as needed (heartburn).   Taking  . cetirizine (ZYRTEC) 10 MG tablet Take 10 mg by mouth daily.  0 Taking  . chlorhexidine (PERIDEX) 0.12 % solution SWISH WITH 1/2OZ FOR 30SECS, THEN SPIT OUT TWICE DAILY  9 Taking  . cloNIDine (CATAPRES) 0.1 MG tablet Take 0.1 mg by mouth daily.    Taking  . dicyclomine (BENTYL) 10 MG capsule Take 10 mg by mouth daily.   Taking  . donepezil (ARICEPT) 10 MG tablet Take 1 tablet (10 mg total) by mouth at bedtime. 90 tablet 3 Taking  . Dulaglutide (TRULICITY) 1.5 HW/2.9HB SOPN Inject 1.5 mg into the skin every Sunday.   Taking  .  DULoxetine (CYMBALTA) 60 MG capsule Take 60 mg by mouth daily.   Taking  . furosemide (LASIX) 80 MG tablet Take 80 mg by mouth 2 (two) times daily.    Taking  . glimepiride (AMARYL) 4 MG tablet Take 4 mg by mouth daily with breakfast.   Taking  . levothyroxine (SYNTHROID, LEVOTHROID) 125 MCG tablet Take 125 mcg by mouth daily before breakfast.   Taking  . lidocaine (LIDODERM) 5 % Place 1 patch onto the skin daily. Remove & Discard patch within 12 hours or as directed by MD (Patient taking differently: Place 1 patch onto the skin daily as needed (pain). Remove & Discard patch within 12 hours or as directed by MD) 30 patch 1 Taking  . morphine (MSIR) 15 MG tablet Take 1 tablet (15 mg total) by mouth 3 (three) times daily. 90 tablet 0 Taking  . Multiple Vitamins-Minerals (CENTRUM SILVER PO) Take 1 tablet by mouth daily.   Taking  . Omega-3 Fatty Acids (FISH OIL) 1000 MG CAPS Take 1,000 mg by mouth 2 (two) times daily.    Taking  . OVER THE COUNTER MEDICATION Take 1 tablet by mouth daily. NeuRx TF   Taking  . pravastatin (PRAVACHOL) 40 MG tablet Take 40 mg by mouth at bedtime.    Taking  . propranolol (INDERAL)  20 MG tablet Take 1 tablet (20 mg total) by mouth 3 (three) times daily. 270 tablet 3   . ranitidine (ZANTAC) 150 MG tablet Take 150 mg by mouth 2 (two) times daily.   Taking  . vitamin B-12 (CYANOCOBALAMIN) 1000 MCG tablet Take 1,000 mcg by mouth daily.   Taking    Assessment: 77 yo female transferred from [place]. She is on heparin at 800 units/hr for ACS and pharmacy consulted to manage.  Goal of Therapy:  Heparin level 0.3-0.7 units/ml Monitor platelets by anticoagulation protocol: Yes   Plan:   -Continue heparin at 800 units/hr -Check heparin level now -Daily heparin level and CBC  Hildred Laser, PharmD Clinical Pharmacist **Pharmacist phone directory can now be found on [email or web address] ([email or web address]).  Listed under [place].

## 2018-06-16 ENCOUNTER — Inpatient Hospital Stay (HOSPITAL_COMMUNITY): Payer: Medicare Other

## 2018-06-16 DIAGNOSIS — G9341 Metabolic encephalopathy: Secondary | ICD-10-CM

## 2018-06-16 DIAGNOSIS — I6789 Other cerebrovascular disease: Secondary | ICD-10-CM

## 2018-06-16 LAB — CBC
HCT: 39.4 % (ref 36.0–46.0)
Hemoglobin: 12.3 g/dL (ref 12.0–15.0)
MCH: 29.9 pg (ref 26.0–34.0)
MCHC: 31.2 g/dL (ref 30.0–36.0)
MCV: 95.6 fL (ref 80.0–100.0)
Platelets: 383 10*3/uL (ref 150–400)
RBC: 4.12 MIL/uL (ref 3.87–5.11)
RDW: 13.8 % (ref 11.5–15.5)
WBC: 21.3 10*3/uL — ABNORMAL HIGH (ref 4.0–10.5)
nRBC: 0 % (ref 0.0–0.2)

## 2018-06-16 LAB — CORTISOL: Cortisol, Plasma: 49.3 ug/dL

## 2018-06-16 LAB — COMPREHENSIVE METABOLIC PANEL
ALT: 24 U/L (ref 0–44)
AST: 37 U/L (ref 15–41)
Albumin: 3.3 g/dL — ABNORMAL LOW (ref 3.5–5.0)
Alkaline Phosphatase: 78 U/L (ref 38–126)
Anion gap: 14 (ref 5–15)
BUN: 77 mg/dL — AB (ref 8–23)
CO2: 28 mmol/L (ref 22–32)
Calcium: 9.5 mg/dL (ref 8.9–10.3)
Chloride: 103 mmol/L (ref 98–111)
Creatinine, Ser: 5.75 mg/dL — ABNORMAL HIGH (ref 0.44–1.00)
GFR calc Af Amer: 8 mL/min — ABNORMAL LOW (ref >60)
GFR calc non Af Amer: 7 mL/min — ABNORMAL LOW (ref >60)
Glucose, Bld: 241 mg/dL — ABNORMAL HIGH (ref 70–99)
POTASSIUM: 3.6 mmol/L (ref 3.5–5.1)
Sodium: 145 mmol/L (ref 135–145)
Total Bilirubin: 0.8 mg/dL (ref 0.3–1.2)
Total Protein: 6.2 g/dL — ABNORMAL LOW (ref 6.5–8.1)

## 2018-06-16 LAB — HEMOGLOBIN A1C
Hgb A1c MFr Bld: 7.5 % — ABNORMAL HIGH (ref 4.8–5.6)
Mean Plasma Glucose: 168.55 mg/dL

## 2018-06-16 LAB — URINALYSIS, ROUTINE W REFLEX MICROSCOPIC
Bilirubin Urine: NEGATIVE
Glucose, UA: 50 mg/dL — AB
Ketones, ur: NEGATIVE mg/dL
Leukocytes, UA: NEGATIVE
Nitrite: NEGATIVE
Protein, ur: >=300 mg/dL — AB
Specific Gravity, Urine: 1.013 (ref 1.005–1.030)
pH: 6 (ref 5.0–8.0)

## 2018-06-16 LAB — CK: Total CK: 537 U/L — ABNORMAL HIGH (ref 38–234)

## 2018-06-16 LAB — IRON AND TIBC
Iron: 89 ug/dL (ref 28–170)
Saturation Ratios: 34 % — ABNORMAL HIGH (ref 10.4–31.8)
TIBC: 262 ug/dL (ref 250–450)
UIBC: 173 ug/dL

## 2018-06-16 LAB — LIPID PANEL
Cholesterol: 171 mg/dL (ref 0–200)
HDL: 51 mg/dL (ref >40)
LDL Cholesterol: 85 mg/dL (ref 0–99)
Total CHOL/HDL Ratio: 3.4 RATIO
Triglycerides: 173 mg/dL — ABNORMAL HIGH (ref <150)
VLDL: 35 mg/dL (ref 0–40)

## 2018-06-16 LAB — GLUCOSE, CAPILLARY
GLUCOSE-CAPILLARY: 218 mg/dL — AB (ref 70–99)
Glucose-Capillary: 127 mg/dL — ABNORMAL HIGH (ref 70–99)
Glucose-Capillary: 197 mg/dL — ABNORMAL HIGH (ref 70–99)
Glucose-Capillary: 253 mg/dL — ABNORMAL HIGH (ref 70–99)

## 2018-06-16 LAB — HEPARIN LEVEL (UNFRACTIONATED)
Heparin Unfractionated: 0.74 IU/mL — ABNORMAL HIGH (ref 0.30–0.70)
Heparin Unfractionated: 0.47 IU/mL (ref 0.30–0.70)

## 2018-06-16 LAB — VITAMIN B12

## 2018-06-16 LAB — RETICULOCYTES
IMMATURE RETIC FRACT: 8 % (ref 2.3–15.9)
RBC.: 3.98 MIL/uL (ref 3.87–5.11)
Retic Count, Absolute: 62.5 10*3/uL (ref 19.0–186.0)
Retic Ct Pct: 1.6 % (ref 0.4–3.1)

## 2018-06-16 LAB — ECHOCARDIOGRAM COMPLETE
Height: 65 in
Weight: 3075.86 oz

## 2018-06-16 LAB — FOLATE: Folate: 39.8 ng/mL (ref >5.9)

## 2018-06-16 LAB — FERRITIN: Ferritin: 252 ng/mL (ref 11–307)

## 2018-06-16 LAB — AMMONIA: AMMONIA: 16 umol/L (ref 9–35)

## 2018-06-16 LAB — SALICYLATE LEVEL: Salicylate Lvl: <7 mg/dL (ref 2.8–30.0)

## 2018-06-16 MED ORDER — MORPHINE SULFATE (PF) 4 MG/ML IV SOLN: 6.0000 mg | Freq: Three times a day (TID) | INTRAVENOUS | Status: DC

## 2018-06-16 MED ORDER — METOPROLOL TARTRATE 5 MG/5ML IV SOLN
5.0000 mg | Freq: Four times a day (QID) | INTRAVENOUS | Status: DC
  Administered 2018-06-16 – 2018-06-18 (×7): 5 mg via INTRAVENOUS
  Filled 2018-06-16 (×7): qty 5

## 2018-06-16 MED ORDER — SODIUM CHLORIDE 0.9 % IV SOLN: INTRAVENOUS | Status: DC

## 2018-06-16 MED ORDER — FUROSEMIDE 80 MG PO TABS
80.0000 mg | ORAL_TABLET | Freq: Every day | ORAL | Status: DC
  Administered 2018-06-17 – 2018-06-20 (×4): 80 mg via ORAL
  Filled 2018-06-16 (×5): qty 1

## 2018-06-16 MED ORDER — SODIUM CHLORIDE 0.9 % IV BOLUS: 250.0000 mL | Freq: Once | INTRAVENOUS | Status: DC

## 2018-06-16 MED ORDER — MORPHINE SULFATE (PF) 4 MG/ML IV SOLN: 4.0000 mg | Freq: Three times a day (TID) | INTRAVENOUS | Status: DC

## 2018-06-16 MED ORDER — HALOPERIDOL LACTATE 5 MG/ML IJ SOLN
2.0000 mg | Freq: Once | INTRAMUSCULAR | Status: AC
  Administered 2018-06-16: 2 mg via INTRAVENOUS
  Filled 2018-06-16: qty 1

## 2018-06-16 MED ORDER — HEPARIN SODIUM (PORCINE) 5000 UNIT/ML IJ SOLN
5000.0000 [IU] | Freq: Three times a day (TID) | INTRAMUSCULAR | Status: DC
  Administered 2018-06-16 – 2018-06-25 (×27): 5000 [IU] via SUBCUTANEOUS
  Filled 2018-06-16 (×28): qty 1

## 2018-06-16 MED ORDER — ACETAMINOPHEN 325 MG RE SUPP
325.0000 mg | RECTAL | Status: DC | PRN
  Administered 2018-06-18 – 2018-06-20 (×2): 325 mg via RECTAL
  Filled 2018-06-16 (×2): qty 1

## 2018-06-16 MED ORDER — MORPHINE SULFATE (PF) 4 MG/ML IV SOLN
4.0000 mg | Freq: Three times a day (TID) | INTRAVENOUS | Status: DC
  Administered 2018-06-16 (×2): 4 mg via INTRAVENOUS
  Filled 2018-06-16 (×2): qty 1

## 2018-06-16 MED ORDER — HALOPERIDOL LACTATE 5 MG/ML IJ SOLN
2.0000 mg | Freq: Four times a day (QID) | INTRAMUSCULAR | Status: DC | PRN
  Administered 2018-06-16 – 2018-06-22 (×4): 5 mg via INTRAVENOUS
  Filled 2018-06-16 (×4): qty 1

## 2018-06-16 MED ORDER — INSULIN ASPART 100 UNIT/ML ~~LOC~~ SOLN
0.0000 [IU] | SUBCUTANEOUS | Status: DC
  Administered 2018-06-16: 3 [IU] via SUBCUTANEOUS
  Administered 2018-06-16: 5 [IU] via SUBCUTANEOUS
  Administered 2018-06-16: 3 [IU] via SUBCUTANEOUS
  Administered 2018-06-17: 1 [IU] via SUBCUTANEOUS
  Administered 2018-06-17 (×2): 2 [IU] via SUBCUTANEOUS
  Administered 2018-06-17: 3 [IU] via SUBCUTANEOUS
  Administered 2018-06-17: 2 [IU] via SUBCUTANEOUS
  Administered 2018-06-17: 3 [IU] via SUBCUTANEOUS
  Administered 2018-06-18 – 2018-06-19 (×5): 2 [IU] via SUBCUTANEOUS
  Administered 2018-06-19: 1 [IU] via SUBCUTANEOUS
  Administered 2018-06-19 (×2): 2 [IU] via SUBCUTANEOUS
  Administered 2018-06-19: 100 [IU] via SUBCUTANEOUS
  Administered 2018-06-19: 1 [IU] via SUBCUTANEOUS
  Administered 2018-06-20: 2 [IU] via SUBCUTANEOUS
  Administered 2018-06-20: 1 [IU] via SUBCUTANEOUS
  Administered 2018-06-20 – 2018-06-21 (×5): 2 [IU] via SUBCUTANEOUS
  Administered 2018-06-21: 1 [IU] via SUBCUTANEOUS
  Administered 2018-06-21: 3 [IU] via SUBCUTANEOUS
  Administered 2018-06-21: 1 [IU] via SUBCUTANEOUS
  Administered 2018-06-22 (×2): 2 [IU] via SUBCUTANEOUS

## 2018-06-16 MED ORDER — ORAL CARE MOUTH RINSE
15.0000 mL | Freq: Two times a day (BID) | OROMUCOSAL | Status: DC
  Administered 2018-06-16 – 2018-06-25 (×10): 15 mL via OROMUCOSAL

## 2018-06-16 MED ORDER — SODIUM CHLORIDE 0.9 % IV SOLN
INTRAVENOUS | Status: DC | PRN
  Administered 2018-06-16: 1000 mL via INTRAVENOUS

## 2018-06-16 MED ORDER — CLONIDINE HCL 0.2 MG/24HR TD PTWK
0.2000 mg | MEDICATED_PATCH | TRANSDERMAL | Status: DC
  Administered 2018-06-16: 0.2 mg via TRANSDERMAL
  Filled 2018-06-16: qty 1

## 2018-06-16 NOTE — Progress Notes (Signed)
Verbal order from Jonelle Sidle MD) to keep pt NPO tonight and use ordered IV medications.  RN will continue to monitor pt.

## 2018-06-16 NOTE — Progress Notes (Signed)
  Echocardiogram 2D Echocardiogram has been performed.  Paige Arnold 06/16/2018, 10:56 AM

## 2018-06-16 NOTE — Progress Notes (Signed)
EEG Completed; Results Pending  

## 2018-06-16 NOTE — Evaluation (Signed)
Occupational Therapy Evaluation Patient Details Name: Paige Arnold MRN: [id number] DOB: Sep 28, 1940 Today's Date: 06/16/2018    History of Present Illness 77 y.o. female with medical history significant of chronic kidney disease stage IV, hypertension, diabetes, morbid obesity, GERD, osteoarthritis and chronic pain who has had progressive mild jerky movements concerning for possible seizures over the last few weeks in addition to AMS. Patient presents now with metabolic encephalopathy, work up underway.   Clinical Impression   This 77 yo female admitted with above presents to acute OT with keeping eyes closed most of session, +2 A for transfers and ambulation, decreased awareness of deficits and safety awareness, decreased balance (sitting and standing), decreased command following (does better with tactile cues) all affecting her safety and independence with basic ADLs. She will benefit from acute OT with follow up at SNF.    Follow Up Recommendations  SNF;Supervision/Assistance - 24 hour;Other (comment)(but depends on how mentation clears)    Equipment Recommendations  Other (comment)(TBD)       Precautions / Restrictions Precautions Precautions: Fall Restrictions Weight Bearing Restrictions: No      Mobility Bed Mobility Overal bed mobility: Needs Assistance Bed Mobility: Supine to Sit     Supine to sit: Mod assist;HOB elevated        Transfers Overall transfer level: Needs assistance Equipment used: 2 person hand held assist Transfers: Sit to/from Stand Sit to Stand: Min assist;+2 physical assistance              Balance Overall balance assessment: Needs assistance Sitting-balance support: No upper extremity supported Sitting balance-Leahy Scale: Poor Sitting balance - Comments: left lean    Standing balance support: Bilateral upper extremity supported Standing balance-Leahy Scale: Poor Standing balance comment: Needs Bil HHA for sit<>stand and ambulation                           ADL either performed or assessed with clinical judgement   ADL Overall ADL's : Needs assistance/impaired                                       General ADL Comments: Pt currently total A at bed level due to not consistently following VCs, placed washcloth in hand without response, then placed washcloth on face without initial response, pt did finally reach up and pull washcloth off of face     Vision   Additional Comments: Pt kept eyes closed 99% of session            Pertinent Vitals/Pain Pain Assessment: No/denies pain(no signs symptoms)     Hand Dominance  Pt unable to state   Extremity/Trunk Assessment Upper Extremity Assessment Upper Extremity Assessment: Generalized weakness           Communication  Expressive and receptive issues   Cognition Arousal/Alertness: Lethargic(came into hospital AMS prior to admission but has also been given meds this AM) Behavior During Therapy: Flat affect(eyes closed most of session) Overall Cognitive Status: No family/caregiver present to determine baseline cognitive functioning                                 General Comments: Very little talking from her. She did say she did not want to lay down and when asked what she wanted she said water (currently NPO). Also reported  she had to urinate, but could not understand about the foley she had              Home Living Family/patient expects to be discharged to:: Skilled nursing facility                                 Additional Comments: Pt unable to tell us PLOF               OT Problem List: Decreased strength;Impaired balance (sitting and/or standing);Decreased safety awareness;Decreased cognition;Decreased knowledge of use of DME or AE      OT Treatment/Interventions: Self-care/ADL training;Balance training;DME and/or AE instruction;Patient/family education;Therapeutic activities    OT  Goals(Current goals can be found in the care plan section) Acute Rehab OT Goals OT Goal Formulation: Patient unable to participate in goal setting Time For Goal Achievement: 06/30/18 Potential to Achieve Goals: Fair  OT Frequency: Min 2X/week           Co-evaluation   Reason for Co-Treatment: For patient/therapist safety;To address functional/ADL transfers;Complexity of the patient's impairments (multi-system involvement);Necessary to address cognition/behavior during functional activity PT goals addressed during session: Mobility/safety with mobility OT goals addressed during session: ADL's and self-care;Strengthening/ROM      AM-PAC OT "6 Clicks" Daily Activity     Outcome Measure Help from another person eating meals?: Total(NPO) Help from another person taking care of personal grooming?: Total Help from another person toileting, which includes using toliet, bedpan, or urinal?: Total Help from another person bathing (including washing, rinsing, drying)?: Total Help from another person to put on and taking off regular upper body clothing?: Total Help from another person to put on and taking off regular lower body clothing?: Total 6 Click Score: 6   End of Session Equipment Utilized During Treatment: Gait belt Nurse Communication: (RN in room and A'ing with ambulation (with reclliner))  Activity Tolerance: Patient limited by lethargy Patient left: in bed;with nursing/sitter in room(RN sitting in room with pt)  OT Visit Diagnosis: Unsteadiness on feet (R26.81);Other abnormalities of gait and mobility (R26.89);Other symptoms and signs involving cognitive function;Cognitive communication deficit (R41.841)                Time: 8118-8677 OT Time Calculation (min): 20 min Charges:  OT General Charges $OT Visit: 1 Visit OT Evaluation $OT Eval Moderate Complexity: [place], OTR/L Acute [employer] Pager [phone] Office [phone]     Almon Register 06/16/2018, 10:18 AM

## 2018-06-16 NOTE — Progress Notes (Signed)
MRI was attempted but pt not compliant at this time. MD Jonelle Sidle) was notified. RN will continue to monitor pt.

## 2018-06-16 NOTE — Progress Notes (Signed)
[place] for heparin Indication: chest pain/ACS  Allergies  Allergen Reactions  . Furosemide Other (See Comments)    Malaise (intolerance)  . Sulfa Antibiotics Itching    Patient Measurements: Height: 5\' 5"  (165.1 cm) Weight: 192 lb 3.9 oz (87.2 kg) IBW/kg (Calculated) : 57   Vital Signs: Temp: 98.3 F (36.8 C) (12/02 0000) Temp Source: Oral (12/02 0000) BP: 191/86 (12/01 2330) Pulse Rate: 80 (12/01 2330)  Labs: Recent Labs    06/15/18 1958 06/16/18 0002  HGB 12.4 12.3  HCT 40.9 39.4  PLT 396 383  HEPARINUNFRC  --  0.74*  CREATININE 4.59*  --   TROPONINI 0.33*  --     Estimated Creatinine Clearance: 11.4 mL/min (A) (by C-G formula based on SCr of 4.59 mg/dL (H)).  Assessment: 77 yo female transferred from [place]. She is on heparin at 800 units/hr for ACS and pharmacy consulted to manage. Heparin level 0.74 units/ml  Goal of Therapy:  Heparin level 0.3-0.7 units/ml Monitor platelets by anticoagulation protocol: Yes   Plan:   -Decrease heparin to 750 units/hr -Check heparin level in ~ 6-8 hours -Daily heparin level and CBC Thanks for allowing pharmacy to be a part of this patient's care.  Excell Seltzer, PharmD Clinical Pharmacist

## 2018-06-16 NOTE — Progress Notes (Signed)
Patient's daughter Ivin Booty called and asked for an update on her mother. I spoke with patient's daughter and explained that she has been calm and stable per day shift report, and vital signs have also been stable. I explained that I assessed Paige Arnold and she has been calm and resting since change of shift.  I also let Ivin Booty know that her mother would be transferring to 2W per doctor's orders and she would have a tele-sitter. After I spoke with Ivin Booty, she stated "I am not happy because you cannot say my mother is stable. You have not seen my mother trying to climb out of the bed."   Patient's daughter started to get loud and belligerent  and hung up the phone.   Patient is still resting and will be transfered shortly. Will continue to monitor.  Harish Bram E Reola Mosher, [place]

## 2018-06-16 NOTE — Progress Notes (Signed)
[facility] TEAM 1 - Stepdown/ICU TEAM  Paige Arnold  CWC:[id number] DOB: February 04, 1941 DOA: 06/15/2018 PCP: Angelina Sheriff, MD    Brief Narrative:  77 y.o. female w/ a hx of CKD stage IV, HTN, diabetes, obesity, GERD, osteoarthritis and chronic pain who reported progressive mild jerky movements concerning for possible seizures over a few weeks, followed by a 30-minute stage of general confusion.  She was seen by her Neurologist who recommended an MRI and adjusted her medications (renal dosing) but her symptoms have gotten worse in the interim. Upon presentation to the ED she was agitated and confused.  She was found to have a systolic of 761. CT head was normal. Exam noted no evidence of focal neurologic deficit.  Her creatinine was 4.6 with a baseline creatinine of 3.6 about a month prior. Troponin steadily rose frome 0.3 > 0.9.   Cardiology recommended medical management with heparin and nitroglycerin for NSTEMI. Given her worsening renal fxn she was transferred to [facility].   Significant Events: 11/28? Admit to [facility] 12/1 transfer to [facility]   Subjective: The patient remains altered.  She rapidly fluctuates from moments of sedation to moments of significant agitation and impulsivity.  She makes multiple attempts to get out of bed.  She will not answer questions directly but does speak.  She moves all 4 extremities spontaneously and equally.  There is no facial abnormality/deficit.  Assessment & Plan:  Acute encephalopathy Diff includes acute CVA, hypertensive encephalopathy, seizure disorder - MRI not possible due to agitation - I favor HTN enceph at this time - follow BP and adjust tx as necessary - avoid over aggressive correction   Chronic pain has been on morphine TID over a decade - cont IV morphine 6 mg TID to prevent withdrawal  Hypertensive urgency Initial BP systolic [id number] - Cardene drip utilized at [facility] - begin clonidine patch and IV hydralazine   Acute kidney injury in the  setting of stage 4-5 chronic kidney disease Creatinine on admission at 4.6 - baseline creatinine of 3.7   Recent Labs  Lab 06/15/18 1958  CREATININE 4.59*    Diabetes mellitus, poorly controlled A1c 7.5 - SSI - follow   Elevated TSH TSH on admission 8.77 suggesting insufficient dose of synthroid, or noncompliance on behalf of pt - cont home regimen and suggest f/u as outpt when not acutely ill   Acute diastolic heart failure since admission at [facility] patient diuresed over 3 L of fluid - no previous heart history - TTE 12/1 noted moderate concentric LVH with normal left ventricular contractility and elevation of end-diastolic pressure - no gross overload on exam - w/ no oral intake will hold further diuretic for now  [employer]   06/15/18 1800  Weight: 87.2 kg    NSTEMI EKG w/ no evidence of ST elevation or depressions - Troponin peaked at 0.93 - med management for now   Obesity - Body mass index is 31.99 kg/m.  Senile dementia with acute behavioral disturbance baseline is highly functional - continue Aricept as able   ? Excessive use of Tylenol daughter reported patient had been taking 2 tylenol every 4 hours around the clock for 2 weeks - APAP level at admit to [facility] <10, but this is hours if not days after initial presentation - fortunately LFTs are normal - will check ammonia level    DVT prophylaxis: SCDs + SQ heparin  Code Status: FULL CODE Family Communication: no family present at time of exam  Disposition Plan:  SDU  Consultants:  none  Antimicrobials:  none   Objective: Blood pressure (!) 181/104, pulse 64, temperature 98.8 F (37.1 C), temperature source Oral, resp. rate 16, height 5\' 5"  (1.651 m), weight 87.2 kg, SpO2 97 %.  Intake/Output Summary (Last 24 hours) at 06/16/2018 0930 Last data filed at 06/16/2018 0900 Gross per 24 hour  Intake 181.31 ml  Output 900 ml  Net -718.69 ml   Filed Weights   06/15/18 1800  Weight: 87.2 kg     Examination: General: No acute respiratory distress - agitated - confused  Lungs: Clear to auscultation bilaterally without wheezes or crackles Cardiovascular: Regular rate and rhythm without murmur gallop or rub normal S1 and S2 Abdomen: Nontender, nondistended, soft, bowel sounds positive, no rebound, no ascites, no appreciable mass Extremities: No significant cyanosis, clubbing, or edema bilateral lower extremities  CBC: Recent Labs  Lab 06/15/18 1958 06/16/18 0002  WBC 23.3* 21.3*  HGB 12.4 12.3  HCT 40.9 39.4  MCV 96.5 95.6  PLT 396 502   Basic Metabolic Panel: Recent Labs  Lab 06/15/18 1958  NA 142  K 3.7  CL 100  CO2 25  GLUCOSE 198*  BUN 66*  CREATININE 4.59*  CALCIUM 9.4   GFR: Estimated Creatinine Clearance: 11.4 mL/min (A) (by C-G formula based on SCr of 4.59 mg/dL (H)).  Liver Function Tests: Recent Labs  Lab 06/15/18 1958  AST 31  ALT 23  ALKPHOS 76  BILITOT 1.1  PROT 7.3  ALBUMIN 3.3*   No results for input(s): AMMONIA in the last 168 hours.  Coagulation Profile: No results for input(s): INR, PROTIME in the last 168 hours.  Cardiac Enzymes: Recent Labs  Lab 06/15/18 1958  TROPONINI 0.33*    HbA1C: Hgb A1c MFr Bld  Date/Time Value Ref Range Status  06/16/2018 12:02 AM 7.5 (H) 4.8 - 5.6 % Final    Comment:    (NOTE) Pre diabetes:          5.7%-6.4% Diabetes:              >6.4% Glycemic control for   <7.0% adults with diabetes   03/09/2014 05:41 PM 6.3 (H) <5.7 % Final    Comment:    (NOTE)                                                                       According to the ADA Clinical Practice Recommendations for 2011, when HbA1c is used as a screening test:  >=6.5%   Diagnostic of Diabetes Mellitus           (if abnormal result is confirmed) 5.7-6.4%   Increased risk of developing Diabetes Mellitus References:Diagnosis and Classification of Diabetes Mellitus,Diabetes DXAJ,2878,67(EHMCN 1):S62-S69 and Standards of  Medical Care in         Diabetes - 2011,Diabetes Care,2011,34 (Suppl 1):S11-S61.    CBG: Recent Labs  Lab 06/15/18 2244 06/16/18 0729  GLUCAP 197* 253*    Recent Results (from the past 240 hour(s))  MRSA PCR Screening     Status: None   Collection Time: 06/15/18  8:00 PM  Result Value Ref Range Status   MRSA by PCR NEGATIVE NEGATIVE Final    Comment:        The  GeneXpert MRSA Assay (FDA approved for NASAL specimens only), is one component of a comprehensive MRSA colonization surveillance program. It is not intended to diagnose MRSA infection nor to guide or monitor treatment for MRSA infections. Performed at [facility], [place] [address], [place], [place] [address]      Scheduled Meds: .  stroke: mapping our early stages of recovery book   Does not apply Once  . aspirin  300 mg Rectal Daily   Or  . aspirin  325 mg Oral Daily  . cloNIDine  0.1 mg Oral Daily  . dicyclomine  10 mg Oral Daily  . donepezil  10 mg Oral QHS  . DULoxetine  60 mg Oral Daily  . furosemide  40 mg Intravenous BID  . glimepiride  4 mg Oral Q breakfast  . insulin aspart  0-5 Units Subcutaneous QHS  . insulin aspart  0-9 Units Subcutaneous TID WC  . levothyroxine  75 mcg Intravenous Daily  . loratadine  10 mg Oral Daily  . mouth rinse  15 mL Mouth Rinse BID  . nitroGLYCERIN  1 inch Topical Q6H  . propranolol  20 mg Oral TID  . sodium chloride flush  3 mL Intravenous Q12H   Continuous Infusions: . sodium chloride 10 mL/hr at 06/16/18 0900  . heparin 750 Units/hr (06/16/18 0900)     LOS: 1 day   Cherene Altes, MD [facility] Office  [phone] Pager - Text Page per Shea Evans  If 7PM-7AM, please contact night-coverage per Amion 06/16/2018, 9:30 AM

## 2018-06-16 NOTE — Evaluation (Signed)
Clinical/Bedside Swallow Evaluation Patient Details  Name: Paige Arnold MRN: [id number] Date of Birth: July 27, 1940  Today's Date: 06/16/2018 Time: SLP Start Time (ACUTE ONLY): 56 SLP Stop Time (ACUTE ONLY): 1131 SLP Time Calculation (min) (ACUTE ONLY): 13 min  Past Medical History:  Past Medical History:  Diagnosis Date  . Arthritis   . Diabetes mellitus   . Diabetic neuropathy ([place])   . GERD (gastroesophageal reflux disease)   . Hypertension   . Kidney disease   . Pneumonia Oct. 2016  . Shortness of breath dyspnea    when I first lay down, then it gets better  . Sleep apnea    does not wear CPAP   . Thyroid disorder    Past Surgical History:  Past Surgical History:  Procedure Laterality Date  . A/V FISTULAGRAM N/A 09/20/2017   Procedure: A/V FISTULAGRAM - left arm;  Surgeon: Angelia Mould, MD;  Location: [facility];  Service: Cardiovascular;  Laterality: N/A;  . AV FISTULA PLACEMENT Left 02/16/2015   Procedure: LEFT ARM BRACHIOCEPHALIC (AV) FISTULA CREATION;  Surgeon: Mal Misty, MD;  Location: [facility];  Service: Vascular;  Laterality: Left;  . CHOLECYSTECTOMY    . INTRAOCULAR LENS INSERTION Right 10-21-13  . LIGATION OF COMPETING BRANCHES OF ARTERIOVENOUS FISTULA Left 02/16/2015   Procedure: LIGATION OF COMPETING BRANCHES OF LEFT BRACHIOCEPHALIC ARTERIOVENOUS FISTULA;  Surgeon: Mal Misty, MD;  Location: [facility];  Service: Vascular;  Laterality: Left;  . PARTIAL HIP ARTHROPLASTY    . PARTIAL KNEE ARTHROPLASTY     left  . PERIPHERAL VASCULAR BALLOON ANGIOPLASTY Left 09/20/2017   Procedure: PERIPHERAL VASCULAR BALLOON ANGIOPLASTY;  Surgeon: Angelia Mould, MD;  Location: [facility];  Service: Cardiovascular;  Laterality: Left;  left fistulagram  . TEMPOROMANDIBULAR JOINT SURGERY    . TONSILLECTOMY    . TUBAL LIGATION     HPI:  Paige Arnold is a 77 y.o. female with PMH significant of CKD stage IV, HTN, DM, morbid obesity, GERD, PNA (2016), and  chronic pain who has had progressive mild jerky movements concerning for possible seizures over the last few weeks. Pt [facility] to [facility] 06/15/18 with agitation, confusion, systolic blood pressure about 240. Patient presents now with metabolic encephalopathy, work up underway. Yale swallow performed 12/1, pt noted to be lethargic and disoriented.   Assessment / Plan / Recommendation Clinical Impression  Pt was awake but lethargic, requiring continuous stimulation via verbal/tactile cues to participate in clinical swallow evaluation today. Oral motor examination was limited, as pt was unable to follow basic 1-step commands for complete assessment. Although multiple swallows indicative of piecemeal swallowing noted with sips of thin liquid, pt did not display any overt s/s aspiration across thin, puree, or regular textures. Pt's lethargy increased throughout evaluation and presentation of regular resulted in poor bolus awareness and prolonged oral transit. Recommend initiation of dysphagia 1 (puree) diet and thin liquids ONLY when pt is alert and with FULL supervision to assist with feeding and ensure small bites/sips.    SLP Visit Diagnosis: Dysphagia, unspecified (R13.10)    Aspiration Risk  Mild aspiration risk    Diet Recommendation Dysphagia 1 (Puree);Thin liquid   Liquid Administration via: Straw;Cup Medication Administration: Crushed with puree Supervision: Full supervision/cueing for compensatory strategies;Staff to assist with self feeding Compensations: Minimize environmental distractions;Small sips/bites;Slow rate Postural Changes: Seated upright at 90 degrees    Other  Recommendations Oral Care Recommendations: Oral care BID   Follow up Recommendations 24  hour supervision/assistance      Frequency and Duration min 1 x/week  2 weeks       Prognosis Prognosis for Safe Diet Advancement: Good Barriers to Reach Goals: Cognitive deficits      Swallow Study    General HPI: Paige Arnold is a 77 y.o. female with PMH significant of CKD stage IV, HTN, DM, morbid obesity, GERD, PNA (2016), and chronic pain who has had progressive mild jerky movements concerning for possible seizures over the last few weeks. Pt transferred from [facility] to [facility] 06/15/18 with agitation, confusion, systolic blood pressure about 240. Patient presents now with metabolic encephalopathy, work up underway. Yale swallow performed 12/1, pt noted to be lethargic and disoriented. Type of Study: Bedside Swallow Evaluation Previous Swallow Assessment: none found in chart Diet Prior to this Study: NPO Temperature Spikes Noted: N/A Respiratory Status: Room air History of Recent Intubation: No Behavior/Cognition: Cooperative;Requires cueing;Lethargic/Drowsy Oral Cavity Assessment: Other (comment)(further assess - pt unable to follow commands for assessment) Oral Care Completed by SLP: No Oral Cavity - Dentition: Adequate natural dentition Vision: (kept eyes closed) Self-Feeding Abilities: Total assist Patient Positioning: Upright in bed Baseline Vocal Quality: Normal Volitional Cough: Cognitively unable to elicit Volitional Swallow: Unable to elicit    Oral/Motor/Sensory Function Overall Oral Motor/Sensory Function: Within functional limits(for tasks assessed)   Ice Chips Ice chips: Not tested   Thin Liquid Thin Liquid: Within functional limits Presentation: Cup;Straw Oral Phase Impairments: (none) Oral Phase Functional Implications: (none) Pharyngeal  Phase Impairments: Multiple swallows    Nectar Thick Nectar Thick Liquid: Not tested   Honey Thick Honey Thick Liquid: Not tested   Puree Puree: Within functional limits   Solid     Solid: Impaired Oral Phase Impairments: Poor awareness of bolus Oral Phase Functional Implications: Prolonged oral transit Pharyngeal Phase Impairments: (none)     Jettie Booze, Student SLP  Jettie Booze 06/16/2018,12:15 PM

## 2018-06-16 NOTE — Plan of Care (Signed)
  Problem: Clinical Measurements: Goal: Ability to maintain clinical measurements within normal limits will improve Outcome: Progressing Goal: Respiratory complications will improve Outcome: Progressing Goal: Cardiovascular complication will be avoided Outcome: Progressing   Problem: Activity: Goal: Risk for activity intolerance will decrease Outcome: Progressing   Problem: Pain Managment: Goal: General experience of comfort will improve Outcome: Progressing   Problem: Safety: Goal: Ability to remain free from injury will improve Outcome: Progressing   Problem: Skin Integrity: Goal: Risk for impaired skin integrity will decrease Outcome: Progressing   

## 2018-06-16 NOTE — Progress Notes (Signed)
CRITICAL VALUE ALERT  Critical Value: Troponin 0.33  Date & Time Notied: 06/15/2018 9:32 PM  Provider Notified: Troponin Trending down, MD aware.   Orders Received/Actions taken: No new orders at this time. RN will continue to monitor.

## 2018-06-16 NOTE — Progress Notes (Signed)
I was asked to perform a NIHSS assessment for Paige Arnold. I did not find any focal neuro deficit.  Paige Arnold was confused, attempted to get oob frequently and had received sedatives to affect her assessment.  She would intermittently follow simple commands for a moment and then go back to pulling at medical devices and attempting to get oob.

## 2018-06-16 NOTE — Procedures (Signed)
History: 77 year old female being evaluated for encephalopathy  Sedation: None  Technique: This is a 21 channel routine scalp EEG performed at the bedside with bipolar and monopolar montages arranged in accordance to the international 10/20 system of electrode placement. One channel was dedicated to EKG recording.    Background: The background is moderately well organized with a posterior dominant rhythm of 8 to 9 Hz, however there is intrusion of irregular delta and theta activities even during maximal wakefulness.  There are occasional isolated bifrontally predominant discharges with triphasic morphology.  There are also frequent sharply contoured waves at [facility] and F8 without a clear after going slow wave typically associated with lateral eye movements, however the fact that these are isolated to F7 and F8 and the fact that they were bilaterally strongly argues for an artifactual etiology.  Photic stimulation: Physiologic driving is not performed  EEG Abnormalities: 1) triphasic waves 2) generalized irregular slow activity  Clinical Interpretation: This EEG is consistent with a mild generalized nonspecific cerebral dysfunction (encephalopathy). There was no seizure or seizure predisposition recorded on this study. Please note that lack of epileptiform activity on EEG does not preclude the possibility of epilepsy.   Roland Rack, MD [facility] [phone]  If 7pm- 7am, please page neurology on call as listed in [place].

## 2018-06-16 NOTE — Consult Note (Addendum)
[facility]  HISTORY AND PHYSICAL  Paige Arnold is an 77 y.o. female.    Chief Complaint: myoclonus, ? Seizures  HPI: Pt is a 42F with  PMH significant for CKD V followed by Dr. Lorrene Reid, HTN, HLD, DM, and chronic pain who is now seen in consultation at the request of Dr. Thereasa Solo for evaluation and recommendations surrounding acute on chronic CKD.  History obtained from chart review as pt unable to answer questions.    Briefly, pt presented as a transfer from [place] on 12/1.  She initially presented 11/30 due to myoclonus and concern for seizure activity.  Of note, she saw neurology 11/27 who ordered MRI but wasn't performed yet.    On arrival to [facility], was found to be encephalopathic, agitated, confused with SBP in the 240s.  CT head negative.  Was started on a cardene gtt and given 80 IV Lasix with 2L UOP and pressures improving to the 180s.  Troponins were elevated for which heparin gtt was started.  TTE with diastolic dysfunction  She was transferred to [facility] for higher level of care.    She is followed by Dr. Lorrene Reid for her CKD V.  Last Cr in our system was 3.5 10/29.  She had a L AVF placed which became non-pulsatile; ligation and placement of AVG was being considered.  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  At her last visit with [facility], it was thought that she was able to reduce gabapentin to 300 daily with improvement in symptoms (although this could not be corroborated).  Neurology discontinued this med 11/27.    Pt is agitated today but was able to ambulate with PT in her room.  On nitropaste now and off Cardene gtt.  BP in the 160V-371G systolic.  Trying to get off her R- handed mitt.  Not in pain.  Per RN, pt's family was giving her Tylenol q 3 hrs.  She also takes morphine, cymbalta, and the gabapentin previously described.  An MRI was attempted last night but was aborted due to agitation.      PMH: Past Medical History:   Diagnosis Date  . Arthritis   . Diabetes mellitus   . Diabetic neuropathy ([place])   . GERD (gastroesophageal reflux disease)   . Hypertension   . Kidney disease   . Pneumonia Oct. 2016  . Shortness of breath dyspnea    when I first lay down, then it gets better  . Sleep apnea    does not wear CPAP   . Thyroid disorder    PSH: Past Surgical History:  Procedure Laterality Date  . A/V FISTULAGRAM N/A 09/20/2017   Procedure: A/V FISTULAGRAM - left arm;  Surgeon: Angelia Mould, MD;  Location: [place] [facility];  Service: Cardiovascular;  Laterality: N/A;  . AV FISTULA PLACEMENT Left 02/16/2015   Procedure: LEFT ARM BRACHIOCEPHALIC (AV) FISTULA CREATION;  Surgeon: Mal Misty, MD;  Location: [place];  Service: Vascular;  Laterality: Left;  . CHOLECYSTECTOMY    . INTRAOCULAR LENS INSERTION Right 10-21-13  . LIGATION OF COMPETING BRANCHES OF ARTERIOVENOUS FISTULA Left 02/16/2015   Procedure: LIGATION OF COMPETING BRANCHES OF LEFT BRACHIOCEPHALIC ARTERIOVENOUS FISTULA;  Surgeon: Mal Misty, MD;  Location: [place];  Service: Vascular;  Laterality: Left;  . PARTIAL HIP ARTHROPLASTY    . PARTIAL KNEE ARTHROPLASTY     left  . PERIPHERAL VASCULAR BALLOON ANGIOPLASTY Left 09/20/2017   Procedure: PERIPHERAL VASCULAR  BALLOON ANGIOPLASTY;  Surgeon: Angelia Mould, MD;  Location: [place] [facility];  Service: Cardiovascular;  Laterality: Left;  left fistulagram  . TEMPOROMANDIBULAR JOINT SURGERY    . TONSILLECTOMY    . TUBAL LIGATION       Past Medical History:  Diagnosis Date  . Arthritis   . Diabetes mellitus   . Diabetic neuropathy ([place])   . GERD (gastroesophageal reflux disease)   . Hypertension   . Kidney disease   . Pneumonia Oct. 2016  . Shortness of breath dyspnea    when I first lay down, then it gets better  . Sleep apnea    does not wear CPAP   . Thyroid disorder     Medications:   Scheduled: . aspirin  300 mg Rectal Daily  . cloNIDine  0.2 mg Transdermal  Weekly  . donepezil  10 mg Oral QHS  . heparin injection (subcutaneous)  5,000 Units Subcutaneous Q8H  . insulin aspart  0-9 Units Subcutaneous Q4H  . levothyroxine  75 mcg Intravenous Daily  . loratadine  10 mg Oral Daily  . mouth rinse  15 mL Mouth Rinse BID  .  morphine injection  6 mg Intravenous TID  . nitroGLYCERIN  1 inch Topical Q6H  . propranolol  20 mg Oral TID  . sodium chloride flush  3 mL Intravenous Q12H    Medications Prior to Admission  Medication Sig Dispense Refill  . acetaminophen (TYLENOL) 500 MG tablet Take 1,000 mg by mouth every 6 (six) hours as needed for moderate pain or headache.    . Alum Hydroxide-Mag Trisilicate (GAVISCON) 01-77.9 MG CHEW Chew 1 tablet by mouth daily as needed (heartburn).    . cetirizine (ZYRTEC) 10 MG tablet Take 10 mg by mouth daily.  0  . chlorhexidine (PERIDEX) 0.12 % solution SWISH WITH 1/2OZ FOR 30SECS, THEN SPIT OUT TWICE DAILY  9  . cloNIDine (CATAPRES) 0.1 MG tablet Take 0.1 mg by mouth daily.     Marland Kitchen dicyclomine (BENTYL) 10 MG capsule Take 10 mg by mouth daily.    Marland Kitchen donepezil (ARICEPT) 10 MG tablet Take 1 tablet (10 mg total) by mouth at bedtime. 90 tablet 3  . Dulaglutide (TRULICITY) 1.5 TJ/0.3ES SOPN Inject 1.5 mg into the skin every Sunday.    . DULoxetine (CYMBALTA) 60 MG capsule Take 60 mg by mouth daily.    . furosemide (LASIX) 80 MG tablet Take 80 mg by mouth 2 (two) times daily.     Marland Kitchen glimepiride (AMARYL) 4 MG tablet Take 4 mg by mouth daily with breakfast.    . levothyroxine (SYNTHROID, LEVOTHROID) 125 MCG tablet Take 125 mcg by mouth daily before breakfast.    . lidocaine (LIDODERM) 5 % Place 1 patch onto the skin daily. Remove & Discard patch within 12 hours or as directed by MD (Patient taking differently: Place 1 patch onto the skin daily as needed (pain). Remove & Discard patch within 12 hours or as directed by MD) 30 patch 1  . morphine (MSIR) 15 MG tablet Take 1 tablet (15 mg total) by mouth 3 (three) times daily. 90  tablet 0  . Multiple Vitamins-Minerals (CENTRUM SILVER PO) Take 1 tablet by mouth daily.    . Omega-3 Fatty Acids (FISH OIL) 1000 MG CAPS Take 1,000 mg by mouth 2 (two) times daily.     Marland Kitchen OVER THE COUNTER MEDICATION Take 1 tablet by mouth daily. NeuRx TF    . pravastatin (PRAVACHOL) 40 MG tablet Take 40  mg by mouth at bedtime.     . propranolol (INDERAL) 20 MG tablet Take 1 tablet (20 mg total) by mouth 3 (three) times daily. 270 tablet 3  . ranitidine (ZANTAC) 150 MG tablet Take 150 mg by mouth 2 (two) times daily.    . vitamin B-12 (CYANOCOBALAMIN) 1000 MCG tablet Take 1,000 mcg by mouth daily.      ALLERGIES:   Allergies  Allergen Reactions  . Furosemide Other (See Comments)    Malaise (intolerance)  . Sulfa Antibiotics Itching    FAM HX: Family History  Problem Relation Age of Onset  . Kidney disease Mother   . Heart disease Father   . Diabetes Brother   . Alcohol abuse Brother   . Depression Brother   . Sarcoidosis Brother   . ALS Brother        Deceased, 79s  . Heart disease Brother   . Bipolar disorder Daughter     Social History:   reports that she has never smoked. She has never used smokeless tobacco. She reports that she does not drink alcohol or use drugs.  ROS: ROS: unobtainable due to pt's agitation  Blood pressure (!) 181/104, pulse 64, temperature 98.8 F (37.1 C), temperature source Oral, resp. rate 16, height 5\' 5"  (1.651 m), weight 87.2 kg, SpO2 97 %. PHYSICAL EXAM: Physical Exam  GEN older woman, intermittently agitated and confused HEENT EOMI PERRL MMM NECK + some JVD PULM: clear anteriorly, can't cooperate with the rest of exam CV RRR no m/r/g ABD nontender EXT trace LE edema NEURO able to move all extremities but not follow commands for me, no myoclonus observed ACCESS: L AVF nonpulsatile   Results for orders placed or performed during the hospital encounter of 06/15/18 (from the past 48 hour(s))  CBC     Status: Abnormal   Collection  Time: 06/15/18  7:58 PM  Result Value Ref Range   WBC 23.3 (H) 4.0 - 10.5 K/uL   RBC 4.24 3.87 - 5.11 MIL/uL   Hemoglobin 12.4 12.0 - 15.0 g/dL   HCT 40.9 36.0 - 46.0 %   MCV 96.5 80.0 - 100.0 fL   MCH 29.2 26.0 - 34.0 pg   MCHC 30.3 30.0 - 36.0 g/dL   RDW 13.8 11.5 - 15.5 %   Platelets 396 150 - 400 K/uL   nRBC 0.0 0.0 - 0.2 %    Comment: Performed at [facility], [address] [place], [place] [address]  Comprehensive metabolic panel     Status: Abnormal   Collection Time: 06/15/18  7:58 PM  Result Value Ref Range   Sodium 142 135 - 145 mmol/L   Potassium 3.7 3.5 - 5.1 mmol/L   Chloride 100 98 - 111 mmol/L   CO2 25 22 - 32 mmol/L   Glucose, Bld 198 (H) 70 - 99 mg/dL   BUN 66 (H) 8 - 23 mg/dL   Creatinine, Ser 4.59 (H) 0.44 - 1.00 mg/dL   Calcium 9.4 8.9 - 10.3 mg/dL   Total Protein 7.3 6.5 - 8.1 g/dL   Albumin 3.3 (L) 3.5 - 5.0 g/dL   AST 31 15 - 41 U/L   ALT 23 0 - 44 U/L   Alkaline Phosphatase 76 38 - 126 U/L   Total Bilirubin 1.1 0.3 - 1.2 mg/dL   GFR calc non Af Amer 9 (L) >60 mL/min   GFR calc Af Amer 10 (L) >60 mL/min   Anion gap 17 (H) 5 - 15  Comment: Performed at [facility], [place] [address], [place], [place] [address]  Troponin I - Once     Status: Abnormal   Collection Time: 06/15/18  7:58 PM  Result Value Ref Range   Troponin I 0.33 (HH) <0.03 ng/mL    Comment: CRITICAL RESULT CALLED TO, READ BACK BY AND VERIFIED WITH: BULLINS,J RN 06/15/2018 2133 JORDANS Performed at [facility], [place] [address] [place], [place] [address]   Acetaminophen level     Status: Abnormal   Collection Time: 06/15/18  7:58 PM  Result Value Ref Range   Acetaminophen (Tylenol), Serum <10 (L) 10 - 30 ug/mL    Comment: (NOTE) Therapeutic concentrations vary significantly. A range of 10-30 ug/mL  may be an effective concentration for many patients. However, some  are best treated at concentrations outside of this range. Acetaminophen concentrations >150 ug/mL  at 4 hours after ingestion  and >50 ug/mL at 12 hours after ingestion are often associated with  toxic reactions. Performed at [facility], [place] [address], [place], [place] [address]   MRSA PCR Screening     Status: None   Collection Time: 06/15/18  8:00 PM  Result Value Ref Range   MRSA by PCR NEGATIVE NEGATIVE    Comment:        The GeneXpert MRSA Assay (FDA approved for NASAL specimens only), is one component of a comprehensive MRSA colonization surveillance program. It is not intended to diagnose MRSA infection nor to guide or monitor treatment for MRSA infections. Performed at [facility], [place] [address] [place], [place] [address]   Glucose, capillary     Status: Abnormal   Collection Time: 06/15/18 10:44 PM  Result Value Ref Range   Glucose-Capillary 197 (H) 70 - 99 mg/dL  Hemoglobin A1c     Status: Abnormal   Collection Time: 06/16/18 12:02 AM  Result Value Ref Range   Hgb A1c MFr Bld 7.5 (H) 4.8 - 5.6 %    Comment: (NOTE) Pre diabetes:          5.7%-6.4% Diabetes:              >6.4% Glycemic control for   <7.0% adults with diabetes    Mean Plasma Glucose 168.55 mg/dL    Comment: Performed at [place] [address], [place],  [address]  Lipid panel     Status: Abnormal   Collection Time: 06/16/18 12:02 AM  Result Value Ref Range   Cholesterol 171 0 - 200 mg/dL   Triglycerides 173 (H) <150 mg/dL   HDL 51 >40 mg/dL   Total CHOL/HDL Ratio 3.4 RATIO   VLDL 35 0 - 40 mg/dL   LDL Cholesterol 85 0 - 99 mg/dL    Comment:        Total Cholesterol/HDL:CHD Risk Coronary Heart Disease Risk Table                     Men   Women  1/2 Average Risk   3.4   3.3  Average Risk       5.0   4.4  2 X Average Risk   9.6   7.1  3 X Average Risk  23.4   11.0        Use the calculated Patient Ratio above and the CHD Risk Table to determine the patient's CHD Risk.        ATP III CLASSIFICATION (LDL):  <100  mg/dL   Optimal  100-129  mg/dL    Near or Above                    Optimal  130-159  mg/dL   Borderline  160-189  mg/dL   High  >190     mg/dL   Very High Performed at [place] [address] [place], [place] [address]   Heparin level (unfractionated)     Status: Abnormal   Collection Time: 06/16/18 12:02 AM  Result Value Ref Range   Heparin Unfractionated 0.74 (H) 0.30 - 0.70 IU/mL    Comment: (NOTE) If heparin results are below expected values, and patient dosage has  been confirmed, suggest follow up testing of antithrombin III levels. Performed at [facility], [place] [address], [place], [place] [address]   CBC     Status: Abnormal   Collection Time: 06/16/18 12:02 AM  Result Value Ref Range   WBC 21.3 (H) 4.0 - 10.5 K/uL   RBC 4.12 3.87 - 5.11 MIL/uL   Hemoglobin 12.3 12.0 - 15.0 g/dL   HCT 39.4 36.0 - 46.0 %   MCV 95.6 80.0 - 100.0 fL   MCH 29.9 26.0 - 34.0 pg   MCHC 31.2 30.0 - 36.0 g/dL   RDW 13.8 11.5 - 15.5 %   Platelets 383 150 - 400 K/uL   nRBC 0.0 0.0 - 0.2 %    Comment: Performed at [facility], [place] [address] [place], [place] [address]  Glucose, capillary     Status: Abnormal   Collection Time: 06/16/18  7:29 AM  Result Value Ref Range   Glucose-Capillary 253 (H) 70 - 99 mg/dL    No results found.  Assessment/Plan  1.  Acute on chronic kidney disease stage V: Cr up to 5 initially on Dennise Raabe, now improved to 4.6 with treatment of blood pressure and acute diastolic CHF.  I suspect her encephalopathy may be due to polypharmacy and so will investigate these causes before attributing to uremia.  Will start Lasix 80 PO daily (takes BID at home).  Needs a UA, have ordered--> CK 537, will need to assess for myoglobinuria.   2.  Acute encephalopathy: CT head negative, need MRI per primary.  Getting HIV and RPR, off gabapentin on MAR.  Ammonia level pending, Tylenol and salicylate levels negative--> but pt was admitted 11/30, LFTs normal.  ? If this is all hypertensive  encephalopathy/ PRES.  3.  Hypertensive urgency: on nitropaste, has transitioned to clonidine patch and hydralazine--> of note, inderal was started 11/27 but don't favor that medication for control of BP.  OP meds list only clonidine and Lasix.    3.  Acute diastolic CHF exacerbation: TTE at [facility] with concentric LVH.  Lasix as above.  4.  NSTEMI: on hep gtt (? Demand ischemia), per primary   Rochanda Harpham 06/16/2018, 8:38 AM

## 2018-06-16 NOTE — Progress Notes (Signed)
Carotid duplex completed - Preliminary results found in CV proc. [place] Anelia Carriveau,RVS 06/16/2018, 2:38 PM

## 2018-06-16 NOTE — Evaluation (Signed)
Physical Therapy Evaluation Patient Details Name: Paige Arnold MRN: [id number] DOB: 03-23-1941 Today's Date: 06/16/2018   History of Present Illness  77 y.o. female with medical history significant of chronic kidney disease stage IV, hypertension, diabetes, morbid obesity, GERD, osteoarthritis and chronic pain who has had progressive mild jerky movements concerning for possible seizures over the last few weeks in addition to AMS. Patient presents now with metabolic encephalopathy, work up underway.  Clinical Impression  Orders received for PT evaluation. Patient demonstrates deficits in functional mobility as indicated below. Will benefit from continued skilled PT to address deficits and maximize function. Will see as indicated and progress as tolerated.  At this time, mentation limiting ability to participate. Will follow progress as mentation clears.    Follow Up Recommendations SNF;Supervision/Assistance - 24 hour(pending progress)    Equipment Recommendations  None recommended by PT    Recommendations for Other Services       Precautions / Restrictions Precautions Precautions: Fall Restrictions Weight Bearing Restrictions: No      Mobility  Bed Mobility Overal bed mobility: Needs Assistance Bed Mobility: Supine to Sit     Supine to sit: Mod assist;HOB elevated        Transfers Overall transfer level: Needs assistance Equipment used: 2 person hand held assist Transfers: Sit to/from Stand Sit to Stand: Min assist;+2 physical assistance         General transfer comment: Assist for stability during power up to standing  Ambulation/Gait Ambulation/Gait assistance: Min assist;Mod assist;+2 physical assistance Gait Distance (Feet): 60 Feet Assistive device: 2 person hand held assist Gait Pattern/deviations: Step-through pattern;Decreased stride length Gait velocity: decreased Gait velocity interpretation: <1.31 ft/sec, indicative of household ambulator General Gait  Details: fluctuating between min to mod assist (with max multi modal cues to continue moving)  Financial trader Rankin (Stroke Patients Only)       Balance Overall balance assessment: Needs assistance Sitting-balance support: No upper extremity supported Sitting balance-Leahy Scale: Poor Sitting balance - Comments: left lean    Standing balance support: Bilateral upper extremity supported Standing balance-Leahy Scale: Poor Standing balance comment: Needs Bil HHA for sit<>stand and ambulation                             Pertinent Vitals/Pain Pain Assessment: Faces Faces Pain Scale: Hurts little more Pain Descriptors / Indicators: Discomfort;Grimacing Pain Intervention(s): Monitored during session    Home Living Family/patient expects to be discharged to:: Skilled nursing facility                 Additional Comments: Pt unable to tell us PLOF    Prior Function                 Hand Dominance        Extremity/Trunk Assessment   Upper Extremity Assessment Upper Extremity Assessment: Generalized weakness    Lower Extremity Assessment Lower Extremity Assessment: Generalized weakness       Communication      Cognition Arousal/Alertness: Lethargic(came into hospital AMS prior to admission but has also been given meds this AM) Behavior During Therapy: Flat affect(eyes closed most of session) Overall Cognitive Status: No family/caregiver present to determine baseline cognitive functioning  General Comments: Very little talking from her. She did say she did not want to lay down and when asked what she wanted she said water (currently NPO). Also reported she had to urinate, but could not understand about the foley she had      General Comments      Exercises     Assessment/Plan    PT Assessment Patient needs continued PT services  PT Problem List  Decreased strength;Decreased activity tolerance;Decreased balance;Decreased mobility;Decreased cognition;Decreased safety awareness;Decreased knowledge of precautions;Pain       PT Treatment Interventions DME instruction;Gait training;Functional mobility training;Therapeutic activities;Therapeutic exercise;Balance training;Cognitive remediation;Patient/family education    PT Goals (Current goals can be found in the Care Plan section)  Acute Rehab PT Goals Patient Stated Goal: none stated PT Goal Formulation: Patient unable to participate in goal setting Time For Goal Achievement: 06/30/18 Potential to Achieve Goals: Good    Frequency Min 2X/week   Barriers to discharge Decreased caregiver support      Co-evaluation PT/OT/SLP Co-Evaluation/Treatment: Yes Reason for Co-Treatment: For patient/therapist safety;To address functional/ADL transfers;Complexity of the patient's impairments (multi-system involvement);Necessary to address cognition/behavior during functional activity PT goals addressed during session: Mobility/safety with mobility OT goals addressed during session: ADL's and self-care;Strengthening/ROM       AM-PAC PT "6 Clicks" Mobility  Outcome Measure Help needed turning from your back to your side while in a flat bed without using bedrails?: A Lot Help needed moving from lying on your back to sitting on the side of a flat bed without using bedrails?: A Lot Help needed moving to and from a bed to a chair (including a wheelchair)?: A Lot Help needed standing up from a chair using your arms (e.g., wheelchair or bedside chair)?: A Lot Help needed to walk in hospital room?: A Lot Help needed climbing 3-5 steps with a railing? : Total 6 Click Score: 11    End of Session Equipment Utilized During Treatment: Gait belt Activity Tolerance: Patient limited by fatigue;Patient limited by lethargy Patient left: in bed;with call bell/phone within reach;with bed alarm set;with  restraints reapplied;with nursing/sitter in room Nurse Communication: Mobility status PT Visit Diagnosis: Difficulty in walking, not elsewhere classified (R26.2);Other symptoms and signs involving the nervous system (R29.898)    Time: 7858-8502 PT Time Calculation (min) (ACUTE ONLY): 21 min   Charges:   PT Evaluation $PT Eval Moderate Complexity: 1 Mod          Alben Deeds, PT DPT  Board Certified Neurologic Specialist Acute Rehabilitation Services Pager [phone] Office [phone]   Duncan Dull 06/16/2018, 12:17 PM

## 2018-06-16 NOTE — Progress Notes (Signed)
Visited with family and patient-however patient was sleeping.  Husband and daughter were there.  They are concerned about patient as they know she has stage 4 kidney disease however they were concerned about what other issues she may be having.  They spoke of wanting to hear from the doctor about her condition.  Expressed to family that chaplain is on call tonight and simply ask nurse to page me and I will come be with them. Conard Novak, Chaplain   06/16/18 1700  Clinical Encounter Type  Visited With Patient and family together  Visit Type Initial;Spiritual support  Referral From Family  Consult/Referral To Chaplain  Spiritual Encounters  Spiritual Needs Prayer  Stress Factors  Patient Stress Factors Health changes  Family Stress Factors None identified

## 2018-06-17 ENCOUNTER — Encounter (HOSPITAL_COMMUNITY): Payer: Self-pay

## 2018-06-17 ENCOUNTER — Inpatient Hospital Stay (HOSPITAL_COMMUNITY): Payer: Medicare Other

## 2018-06-17 DIAGNOSIS — N184 Chronic kidney disease, stage 4 (severe): Secondary | ICD-10-CM

## 2018-06-17 LAB — CBC
HEMATOCRIT: 34.8 % — AB (ref 36.0–46.0)
Hemoglobin: 11 g/dL — ABNORMAL LOW (ref 12.0–15.0)
MCH: 30.6 pg (ref 26.0–34.0)
MCHC: 31.6 g/dL (ref 30.0–36.0)
MCV: 96.7 fL (ref 80.0–100.0)
NRBC: 0 % (ref 0.0–0.2)
Platelets: 302 10*3/uL (ref 150–400)
RBC: 3.6 MIL/uL — ABNORMAL LOW (ref 3.87–5.11)
RDW: 13.9 % (ref 11.5–15.5)
WBC: 19.8 10*3/uL — ABNORMAL HIGH (ref 4.0–10.5)

## 2018-06-17 LAB — TROPONIN I: Troponin I: 0.13 ng/mL (ref <0.03)

## 2018-06-17 LAB — COMPREHENSIVE METABOLIC PANEL
ALT: 26 U/L (ref 0–44)
AST: 35 U/L (ref 15–41)
Albumin: 2.9 g/dL — ABNORMAL LOW (ref 3.5–5.0)
Alkaline Phosphatase: 68 U/L (ref 38–126)
Anion gap: 15 (ref 5–15)
BUN: 83 mg/dL — ABNORMAL HIGH (ref 8–23)
CO2: 26 mmol/L (ref 22–32)
Calcium: 9.2 mg/dL (ref 8.9–10.3)
Chloride: 101 mmol/L (ref 98–111)
Creatinine, Ser: 5.58 mg/dL — ABNORMAL HIGH (ref 0.44–1.00)
GFR, EST AFRICAN AMERICAN: 8 mL/min — AB (ref >60)
GFR, EST NON AFRICAN AMERICAN: 7 mL/min — AB (ref >60)
Glucose, Bld: 158 mg/dL — ABNORMAL HIGH (ref 70–99)
Potassium: 3.2 mmol/L — ABNORMAL LOW (ref 3.5–5.1)
SODIUM: 142 mmol/L (ref 135–145)
Total Bilirubin: 1 mg/dL (ref 0.3–1.2)
Total Protein: 5.9 g/dL — ABNORMAL LOW (ref 6.5–8.1)

## 2018-06-17 LAB — BLOOD GAS, ARTERIAL
Acid-Base Excess: 1.5 mmol/L (ref 0.0–2.0)
Bicarbonate: 25.5 mmol/L (ref 20.0–28.0)
Drawn by: 105521
O2 Content: 2 L/min
O2 SAT: 96.8 %
Patient temperature: 98.6
pCO2 arterial: 39.8 mmHg (ref 32.0–48.0)
pH, Arterial: 7.423 (ref 7.350–7.450)
pO2, Arterial: 96 mmHg (ref 83.0–108.0)

## 2018-06-17 LAB — GLUCOSE, CAPILLARY
GLUCOSE-CAPILLARY: 303 mg/dL — AB (ref 70–99)
GLUCOSE-CAPILLARY: 223 mg/dL — AB (ref 70–99)
GLUCOSE-CAPILLARY: 165 mg/dL — AB (ref 70–99)
Glucose-Capillary: 202 mg/dL — ABNORMAL HIGH (ref 70–99)
Glucose-Capillary: 169 mg/dL — ABNORMAL HIGH (ref 70–99)
Glucose-Capillary: 218 mg/dL — ABNORMAL HIGH (ref 70–99)
Glucose-Capillary: 203 mg/dL — ABNORMAL HIGH (ref 70–99)
Glucose-Capillary: 191 mg/dL — ABNORMAL HIGH (ref 70–99)

## 2018-06-17 LAB — HIV ANTIBODY (ROUTINE TESTING W REFLEX): HIV Screen 4th Generation wRfx: NONREACTIVE

## 2018-06-17 LAB — CK: Total CK: 503 U/L — ABNORMAL HIGH (ref 38–234)

## 2018-06-17 LAB — RPR: RPR Ser Ql: NONREACTIVE

## 2018-06-17 MED ORDER — DULOXETINE HCL 60 MG PO CPEP
60.0000 mg | ORAL_CAPSULE | Freq: Every day | ORAL | Status: DC
  Administered 2018-06-17 – 2018-06-25 (×9): 60 mg via ORAL
  Filled 2018-06-17 (×9): qty 1

## 2018-06-17 MED ORDER — HYDRALAZINE HCL 20 MG/ML IJ SOLN
10.0000 mg | Freq: Once | INTRAMUSCULAR | Status: AC
  Administered 2018-06-17: 10 mg via INTRAVENOUS
  Filled 2018-06-17: qty 1

## 2018-06-17 MED ORDER — ROSUVASTATIN CALCIUM 5 MG PO TABS
10.0000 mg | ORAL_TABLET | Freq: Every day | ORAL | Status: DC
  Administered 2018-06-18 – 2018-06-25 (×8): 10 mg via ORAL
  Filled 2018-06-17: qty 2
  Filled 2018-06-17: qty 1
  Filled 2018-06-17 (×2): qty 2
  Filled 2018-06-17 (×2): qty 1
  Filled 2018-06-17: qty 2
  Filled 2018-06-17 (×2): qty 1
  Filled 2018-06-17 (×4): qty 2

## 2018-06-17 MED ORDER — NALOXONE HCL 0.4 MG/ML IJ SOLN
INTRAMUSCULAR | Status: AC
  Administered 2018-06-17: 0.2 mg via INTRAVENOUS
  Filled 2018-06-17: qty 1

## 2018-06-17 MED ORDER — PHENOL 1.4 % MT LIQD: 1.0000 | OROMUCOSAL | Status: DC | PRN

## 2018-06-17 MED ORDER — MENTHOL 3 MG MT LOZG: 1.0000 | LOZENGE | OROMUCOSAL | Status: DC | PRN

## 2018-06-17 MED ORDER — NALOXONE HCL 0.4 MG/ML IJ SOLN
0.2000 mg | INTRAMUSCULAR | Status: DC | PRN
  Administered 2018-06-17: 0.2 mg via INTRAVENOUS

## 2018-06-17 MED ORDER — NALOXONE HCL 0.4 MG/ML IJ SOLN
INTRAMUSCULAR | Status: AC
  Administered 2018-06-17: 0.2 mg
  Filled 2018-06-17: qty 1

## 2018-06-17 MED ORDER — MORPHINE SULFATE (PF) 2 MG/ML IV SOLN: 1.0000 mg | Freq: Three times a day (TID) | INTRAVENOUS | Status: DC | PRN

## 2018-06-17 NOTE — Progress Notes (Signed)
Inpatient Diabetes Program Recommendations  AACE/ADA: New Consensus Statement on Inpatient Glycemic Control (2015)  Target Ranges:  Prepandial:   less than 140 mg/dL      Peak postprandial:   less than 180 mg/dL (1-2 hours)      Critically ill patients:  140 - 180 mg/dL   Lab Results  Component Value Date   GLUCAP 218 (H) 06/17/2018   HGBA1C 7.5 (H) 06/16/2018    Review of Glycemic ControlResults for Paige Arnold, Paige Arnold (MRN [id number]) as of 06/17/2018 10:46  Ref. Range 06/16/2018 16:46 06/16/2018 19:22 06/16/2018 23:30 06/17/2018 03:59 06/17/2018 08:03  Glucose-Capillary Latest Ref Range: 70 - 99 mg/dL 303 (H) 223 (H) 127 (H) 169 (H) 218 (H)    Diabetes history: DM 2 Outpatient Diabetes medications:  Trulicity 1.5 mg q Sunday, Amaryl 6 mg daily, Actos 15 mg daily Current orders for Inpatient glycemic control:  Novolog sensitive q 4 hours Inpatient Diabetes Program Recommendations:   May consider adding Levemir 8 units daily.  Thanks,  Adah Perl, RN, BC-ADM Inpatient Diabetes Coordinator Pager [phone] (8a-5p)

## 2018-06-17 NOTE — Progress Notes (Addendum)
PROGRESS NOTE  Paige Arnold GYB:[id number] DOB: [birth year] DOA: 06/15/2018 PCP: Angelina Sheriff, MD   LOS: 2 days   Brief Narrative / Interim history: 77 y.o.femalew/ a hx of CKD stage IV, HTN, diabetes, obesity, GERD, osteoarthritis and chronic pain who reported progressive mild jerky movements concerning for possible seizures over a few weeks, followed by a 30-minute stage of general confusion. She was seen by her Neurologist who recommended an MRI and adjusted her medications (renal dosing) but her symptoms have gotten worse in the interim. Upon presentation to the ED she was agitated and confused. She was found to have a systolic of 876. CT head was normal. Exam noted no evidence of focal neurologic deficit. Her creatinine was 4.6 with a baseline creatinine of 3.6 about a month prior. Troponin steadily rose frome 0.3 > 0.9.  Cardiology recommended medical management with heparin and nitroglycerin for NSTEMI. Given her worsening renal fxn she was transferred to [facility].   Subjective: -Patient unresponsive on my evaluation, was given Narcan with significant improvement in her mental status.  Is confused, does not know where she is or why she is here.  Complains of generalized pain  Assessment & Plan: Principal Problem:   Metabolic encephalopathy Active Problems:   Diabetes mellitus type 2 with complications, uncontrolled (HCC)   Chronic kidney disease (CKD), stage IV (severe) (HCC)   Hypertensive urgency   Hypothyroidism   Acute metabolic encephalopathy   NSTEMI (non-ST elevated myocardial infarction) (HCC)   Principal Problem Acute encephalopathy -Differential includes acute CVA, hypertensive encephalopathy, seizure disorder -Initially MRI could not be obtained due to agitation, will need to eventually be done when she is calmer perhaps in 1 to 2 days -EEG obtained which showed generalized nonspecific cerebral dysfunction without seizure activity, very nonspecific -My main  concern is home medication and home morphine accumulating in the setting of worsening renal function, had to push Narcan twice today  Additional Problems Acute kidney injury on chronic kidney disease stage IV-V -Baseline creatinine in the mid threes, currently in the 5 range.  Nephrology consulted, discussed with Dr. Hollie Salk, appreciate input and defer further management to nephrology  Type 2 diabetes mellitus, poorly controlled, with renal complication -Most recent A1c 7.5. -Continue sliding scale, hold home medications  Hypertensive urgency -Initial blood pressure at 220-240, status post Cardene drip utilized at [employer] -Continue clonidine patch and IV hydralazine here, she is lethargic at times and p.o. intake is not consistent  Chronic pain -Has been on morphine 3 times daily for a long time.  Her morphine is likely toxic in the setting of worsening renal function given excellent response to Narcan this morning.  I had to push Narcan again around lunchtime as she would not wake up, suspect that morphine will be in her system for quite some time.  Daughter is at bedside and extremely vocal regarding withdrawals and she would not want her mother to go through withdrawals.  Will allow some morphine at a lower dose as needed, but discussed extensively with the family that patient needs to be awake and ask for the pain medication to be given otherwise she is at risk of dying from respiratory depression  Elevated TSH -Continue home regimen and repeat TSH as an outpatient.  Acute diastolic CHF -Since admission at [facility] patient diuresed over 3 L of fluids, no previous heart history.  2D echo here normal LVEF.  Currently she is euvolemic  Dementia -Continue Aricept, apparently at baseline she is highly functional  Scheduled Meds: . aspirin  300 mg Rectal Daily  . cloNIDine  0.2 mg Transdermal Weekly  . donepezil  10 mg Oral QHS  . DULoxetine  60 mg Oral Daily  . furosemide  80 mg Oral  Daily  . heparin injection (subcutaneous)  5,000 Units Subcutaneous Q8H  . insulin aspart  0-9 Units Subcutaneous Q4H  . levothyroxine  75 mcg Intravenous Daily  . mouth rinse  15 mL Mouth Rinse BID  . metoprolol tartrate  5 mg Intravenous Q6H  . nitroGLYCERIN  1 inch Topical Q6H  . sodium chloride flush  3 mL Intravenous Q12H   Continuous Infusions: . sodium chloride 10 mL/hr at 06/16/18 2000  . sodium chloride    . sodium chloride     PRN Meds:.sodium chloride, [DISCONTINUED] acetaminophen **OR** [DISCONTINUED] acetaminophen (TYLENOL) oral liquid 160 mg/5 mL **OR** acetaminophen, haloperidol lactate, morphine injection, sodium chloride flush  DVT prophylaxis: heparin Code Status: Full code Family Communication: Extensive discussion with daughter at bedside Disposition Plan: Home when ready  Consultants:   Nephrology  Procedures:   2D echo Impressions: - Study limited to parasternal views only. Patient would not cooperate with the rest of the study.  Antimicrobials:  None    Objective: Vitals:   06/17/18 0330 06/17/18 0456 06/17/18 0804 06/17/18 1124  BP: (!) 194/94 (!) 106/56 (!) 150/80 (!) 155/73  Pulse: 64  (!) 59 72  Resp: 16     Temp: 97.6 F (36.4 C)  (!) 97.3 F (36.3 C) (!) 97.3 F (36.3 C)  TempSrc: Oral  Oral Oral  SpO2: 96% 99% 97% 99%  Weight:      Height:        Intake/Output Summary (Last 24 hours) at 06/17/2018 1248 Last data filed at 06/17/2018 0400 Gross per 24 hour  Intake 221.4 ml  Output 400 ml  Net -178.6 ml   Filed Weights   06/15/18 1800  Weight: 87.2 kg    Examination:  Constitutional: Lethargic, alert after Narcan, interactive, Eyes: lids and conjunctivae normal ENMT: Mucous membranes are moist.  Neck: normal, supple Respiratory: clear to auscultation bilaterally, no wheezing, no crackles. Normal respiratory effort. No accessory muscle use.  Cardiovascular: Regular rate and rhythm, no murmurs / rubs / gallops. No LE edema.  2+ pedal pulses. No carotid bruits. Abdomen: no tenderness Musculoskeletal: no clubbing / cyanosis.  Skin: no rashes Neurologic: Nonfocal   Data Reviewed: I have independently reviewed following labs and imaging studies   CBC: Recent Labs  Lab 06/15/18 1958 06/16/18 0002 06/17/18 0225  WBC 23.3* 21.3* 19.8*  HGB 12.4 12.3 11.0*  HCT 40.9 39.4 34.8*  MCV 96.5 95.6 96.7  PLT 396 383 938   Basic Metabolic Panel: Recent Labs  Lab 06/15/18 1958 06/16/18 1020 06/17/18 0225  NA 142 145 142  K 3.7 3.6 3.2*  CL 100 103 101  CO2 25 28 26   GLUCOSE 198* 241* 158*  BUN 66* 77* 83*  CREATININE 4.59* 5.75* 5.58*  CALCIUM 9.4 9.5 9.2   GFR: Estimated Creatinine Clearance: 9.4 mL/min (A) (by C-G formula based on SCr of 5.58 mg/dL (H)). Liver Function Tests: Recent Labs  Lab 06/15/18 1958 06/16/18 1020 06/17/18 0225  AST 31 37 35  ALT 23 24 26   ALKPHOS 76 78 68  BILITOT 1.1 0.8 1.0  PROT 7.3 6.2* 5.9*  ALBUMIN 3.3* 3.3* 2.9*   No results for input(s): LIPASE, AMYLASE in the last 168 hours. Recent Labs  Lab 06/16/18 1020  AMMONIA 16  Coagulation Profile: No results for input(s): INR, PROTIME in the last 168 hours. Cardiac Enzymes: Recent Labs  Lab 06/15/18 1958 06/16/18 0851 06/17/18 0608 06/17/18 1151  CKTOTAL  --  537*  --  503*  TROPONINI 0.33*  --  0.13*  --    BNP (last 3 results) No results for input(s): PROBNP in the last 8760 hours. HbA1C: Recent Labs    06/16/18 0002  HGBA1C 7.5*   CBG: Recent Labs  Lab 06/16/18 1922 06/16/18 2330 06/17/18 0359 06/17/18 0803 06/17/18 1121  GLUCAP 223* 127* 169* 218* 203*   Lipid Profile: Recent Labs    06/16/18 0002  CHOL 171  HDL 51  LDLCALC 85  TRIG 173*  CHOLHDL 3.4   Thyroid Function Tests: No results for input(s): TSH, T4TOTAL, FREET4, T3FREE, THYROIDAB in the last 72 hours. Anemia Panel: Recent Labs    06/16/18 1020  VITAMINB12 >7,500*  FOLATE 39.8  FERRITIN 252  TIBC 262  IRON 89   RETICCTPCT 1.6   Urine analysis:    Component Value Date/Time   COLORURINE YELLOW 06/16/2018 [place] 06/16/2018 1035   LABSPEC 1.013 06/16/2018 1035   PHURINE 6.0 06/16/2018 1035   GLUCOSEU 50 (A) 06/16/2018 1035   HGBUR MODERATE (A) 06/16/2018 1035   BILIRUBINUR NEGATIVE 06/16/2018 1035   KETONESUR NEGATIVE 06/16/2018 1035   PROTEINUR >=300 (A) 06/16/2018 1035   UROBILINOGEN 0.2 03/09/2014 1331   NITRITE NEGATIVE 06/16/2018 1035   LEUKOCYTESUR NEGATIVE 06/16/2018 1035   Sepsis Labs: Invalid input(s): PROCALCITONIN, LACTICIDVEN  Recent Results (from the past 240 hour(s))  MRSA PCR Screening     Status: None   Collection Time: 06/15/18  8:00 PM  Result Value Ref Range Status   MRSA by PCR NEGATIVE NEGATIVE Final    Comment:        The GeneXpert MRSA Assay (FDA approved for NASAL specimens only), is one component of a comprehensive MRSA colonization surveillance program. It is not intended to diagnose MRSA infection nor to guide or monitor treatment for MRSA infections. Performed at [facility], [place] [address], [place], [place] [address]       Radiology Studies: US Renal  Result Date: 06/17/2018 CLINICAL DATA:  Chronic kidney disease EXAM: RENAL / URINARY TRACT ULTRASOUND COMPLETE COMPARISON:  CT abdomen pelvis 04/28/2018 FINDINGS: Right Kidney: Renal measurements: 12.1 x 4.4 x 5.2 cm = volume: 144 mL. Right kidney difficult to visualize due to increased echogenicity of the cortex and body habitus. Negative for mass or hydronephrosis. Left Kidney: Renal measurements: 8.5 x 4.5 x 3.8 cm = volume: 75 mL. Increased echogenicity of the left renal cortex. Echogenic suprarenal mass on the left measures 3 x 3.5 cm. This is similar to the prior CT which showed a fatty lesion immediately above the left kidney, separate from the adrenal gland. Negative for hydronephrosis. Bladder: Foley catheter IMPRESSION: 1. Negative for hydronephrosis 2. Small left  kidney. Increased echogenicity of the renal cortex bilaterally 3. 3 x 3.5 cm suprarenal mass on the left unchanged from prior CT. This also is unchanged from CT of 07/26/2014. 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 Electronically Signed   By: Franchot Gallo M.D.   On: 06/17/2018 11:20   Vas US Carotid (at [place] Only)  Result Date: 06/17/2018 Carotid Arterial Duplex Study Indications: CVA. Limitations: Patient unable to cooperate for optimal imaging Performing Technologist: Toma Copier RVS  Examination Guidelines: A complete evaluation includes B-mode imaging, spectral Doppler, color Doppler, and  power Doppler as needed of all accessible portions of each vessel. Bilateral testing is considered an integral part of a complete examination. Limited examinations for reoccurring indications may be performed as noted.  Right Carotid Findings: +----------+--------+--------+--------+--------+-----------------------+           PSV cm/sEDV cm/sStenosisDescribeComments                +----------+--------+--------+--------+--------+-----------------------+ CCA Prox  90      4                       mild intimal thickening +----------+--------+--------+--------+--------+-----------------------+ CCA Distal67      10                      mild intimal thickening +----------+--------+--------+--------+--------+-----------------------+ ICA Prox  40      6                       mild intimal thickening +----------+--------+--------+--------+--------+-----------------------+ ICA Distal45      9                       tortuous                +----------+--------+--------+--------+--------+-----------------------+ ECA       96      10                                              +----------+--------+--------+--------+--------+-----------------------+ +----------+--------+-------+--------+-------------------+           PSV cm/sEDV cmsDescribeArm Pressure (mmHG)  +----------+--------+-------+--------+-------------------+ [id number]                                         +----------+--------+-------+--------+-------------------+ +---------+--------+--+--------+--+ VertebralPSV cm/s45EDV cm/s10 +---------+--------+--+--------+--+ Technically difficult due to patient being unable to cooperate for optimal imaging  Left Carotid Findings: +----------+--------+--------+--------+--------+--------------------------+           PSV cm/sEDV cm/sStenosisDescribeComments                   +----------+--------+--------+--------+--------+--------------------------+ CCA Prox  74      17                      mild intimal thickening    +----------+--------+--------+--------+--------+--------------------------+ CCA Distal68      12                      minimal intimal thickening +----------+--------+--------+--------+--------+--------------------------+ ICA Prox  60      11                                                 +----------+--------+--------+--------+--------+--------------------------+ ICA Distal93      15                      tortuous                   +----------+--------+--------+--------+--------+--------------------------+ ECA       84      3                                                  +----------+--------+--------+--------+--------+--------------------------+ +----------+--------+--------+--------+-------------------+  SubclavianPSV cm/sEDV cm/sDescribeArm Pressure (mmHG) +----------+--------+--------+--------+-------------------+           165                                         +----------+--------+--------+--------+-------------------+ +---------+--------+--+--------+-+ VertebralPSV cm/s42EDV cm/s7 +---------+--------+--+--------+-+ Technically difficult due to patient being unable to cooperate for optimal imaging  Summary: Right Carotid: There is no obvious evidence of stenosis in the right  ICA. Left Carotid: There is no obvious evidence of stenosis in the left ICA. Vertebrals:  Bilateral vertebral arteries demonstrate antegrade flow. Subclavians: Normal flow hemodynamics were seen in bilateral subclavian              arteries. *See table(s) above for measurements and observations.  Electronically signed by Antony Contras MD on 06/17/2018 at 8:03:01 AM.    Final     Time spent: 40 minutes, 3 separate visits, pushing Narcan, more than 50% at bedside in  extensive discussions with the family  Marzetta Board, MD, PhD [facility] Pager [phone]  If 7PM-7AM, please contact night-coverage [email or web address] Password [facility] 06/17/2018, 12:48 PM

## 2018-06-17 NOTE — Progress Notes (Signed)
[facility] Progress Note    Assessment/ Plan:   1. Acute on chronic kidney disease stage V: Cr up to 5 initially on [place], initially improved to 4.6 with treatment of blood pressure and acute diastolic CHF and now up to 5.5.  [place] with some blood and RBCs and WBCs- will order culture.  CK mildly elevated at 537- recheck today.  Ordered renal US- pending.  May need to c/s VVS here for AVG placement depending on neuro w/u.  2.  Acute encephalopathy/ ? seizures: CT head negative, need MRI per primary.  Getting HIV and RPR, off gabapentin on MAR.  Ammonia 16, Tylenol and salicylate levels negative--> but pt was admitted 11/30, LFTs normal.  Much improved this AM after Narcan and BP s are better.  Possible combo of hypertensive encephalopathy and polypharmacy (see below).  Routine EEG without seizure activity.  Carotid US without obvious stenosis.  3.  Hypertensive urgency: on nitropaste, has transitioned to clonidine patch and hydralazine prn--> of note, inderal was started 11/27 but don't favor that medication for control of BP.  OP meds list only clonidine and Lasix.  Could switch from the nitropaste to imdur if desired.  4.  Acute diastolic CHF exacerbation: TTE at [facility] with concentric LVH, TTE here suboptimal study but EF 65-70% without WMA .  Lasix as above.  5.  NSTEMI: s/p hep gtt (? Demand ischemia), per primary.  6.  Polypharmacy: off gabapentin, getting too much morhpine- has been reduced  7.  Dispo: pending  Subjective:    Transferred out of ICU yesterday.  This AM difficult to arouse, received Narcan with good response.  Afterwards, able to follow conversation--> alert and talkative.  Routine EEG without seizure activity, MRI still not obtained, carotid US without obvious stenosis but suboptimal study.   Objective:   BP (!) 150/80   Pulse (!) 59   Temp (!) 97.3 F (36.3 C) (Oral)   Resp 16   Ht 5\' 5"  (1.651 m)   Wt 87.2 kg   SpO2 97%   BMI 31.99  kg/m   Intake/Output Summary (Last 24 hours) at 06/17/2018 0900 Last data filed at 06/17/2018 0400 Gross per 24 hour  Intake 259.71 ml  Output 650 ml  Net -390.29 ml   Weight change:   Physical Exam: Gen: chronically ill-appearing CVS: RRR Resp: clear anteriorly Abd:** soft, nontender Ext: no LE edema Neuro: alert and talktative s/p Narcan,   Imaging: Vas US Carotid (at [facility] Only)  Result Date: 06/17/2018 Carotid Arterial Duplex Study Indications: CVA. Limitations: Patient unable to cooperate for optimal imaging Performing Technologist: Toma Copier RVS  Examination Guidelines: A complete evaluation includes B-mode imaging, spectral Doppler, color Doppler, and power Doppler as needed of all accessible portions of each vessel. Bilateral testing is considered an integral part of a complete examination. Limited examinations for reoccurring indications may be performed as noted.  Right Carotid Findings: +----------+--------+--------+--------+--------+-----------------------+           PSV cm/sEDV cm/sStenosisDescribeComments                +----------+--------+--------+--------+--------+-----------------------+ CCA Prox  90      4                       mild intimal thickening +----------+--------+--------+--------+--------+-----------------------+ CCA Distal67      10                      mild intimal thickening +----------+--------+--------+--------+--------+-----------------------+  ICA Prox  40      6                       mild intimal thickening +----------+--------+--------+--------+--------+-----------------------+ ICA Distal45      9                       tortuous                +----------+--------+--------+--------+--------+-----------------------+ ECA       96      10                                              +----------+--------+--------+--------+--------+-----------------------+  +----------+--------+-------+--------+-------------------+           PSV cm/sEDV cmsDescribeArm Pressure (mmHG) +----------+--------+-------+--------+-------------------+ [id number]                                         +----------+--------+-------+--------+-------------------+ +---------+--------+--+--------+--+ VertebralPSV cm/s45EDV cm/s10 +---------+--------+--+--------+--+ Technically difficult due to patient being unable to cooperate for optimal imaging  Left Carotid Findings: +----------+--------+--------+--------+--------+--------------------------+           PSV cm/sEDV cm/sStenosisDescribeComments                   +----------+--------+--------+--------+--------+--------------------------+ CCA Prox  74      17                      mild intimal thickening    +----------+--------+--------+--------+--------+--------------------------+ CCA Distal68      12                      minimal intimal thickening +----------+--------+--------+--------+--------+--------------------------+ ICA Prox  60      11                                                 +----------+--------+--------+--------+--------+--------------------------+ ICA Distal93      15                      tortuous                   +----------+--------+--------+--------+--------+--------------------------+ ECA       84      3                                                  +----------+--------+--------+--------+--------+--------------------------+ +----------+--------+--------+--------+-------------------+ SubclavianPSV cm/sEDV cm/sDescribeArm Pressure (mmHG) +----------+--------+--------+--------+-------------------+           165                                         +----------+--------+--------+--------+-------------------+ +---------+--------+--+--------+-+ VertebralPSV cm/s42EDV cm/s7 +---------+--------+--+--------+-+ Technically difficult due to patient being  unable to cooperate for optimal imaging  Summary: Right Carotid: There is no obvious evidence of stenosis in the right ICA. Left Carotid: There is no obvious evidence of  stenosis in the left ICA. Vertebrals:  Bilateral vertebral arteries demonstrate antegrade flow. Subclavians: Normal flow hemodynamics were seen in bilateral subclavian              arteries. *See table(s) above for measurements and observations.  Electronically signed by Antony Contras MD on 06/17/2018 at 8:03:01 AM.    Final     Labs: BMET Recent Labs  Lab 06/15/18 1958 06/16/18 1020 06/17/18 0225  NA 142 145 142  K 3.7 3.6 3.2*  CL 100 103 101  CO2 25 28 26   GLUCOSE 198* 241* 158*  BUN 66* 77* 83*  CREATININE 4.59* 5.75* 5.58*  CALCIUM 9.4 9.5 9.2   CBC Recent Labs  Lab 06/15/18 1958 06/16/18 0002 06/17/18 0225  WBC 23.3* 21.3* 19.8*  HGB 12.4 12.3 11.0*  HCT 40.9 39.4 34.8*  MCV 96.5 95.6 96.7  PLT 396 383 302    Medications:    . aspirin  300 mg Rectal Daily  . cloNIDine  0.2 mg Transdermal Weekly  . donepezil  10 mg Oral QHS  . furosemide  80 mg Oral Daily  . heparin injection (subcutaneous)  5,000 Units Subcutaneous Q8H  . insulin aspart  0-9 Units Subcutaneous Q4H  . levothyroxine  75 mcg Intravenous Daily  . mouth rinse  15 mL Mouth Rinse BID  . metoprolol tartrate  5 mg Intravenous Q6H  . nitroGLYCERIN  1 inch Topical Q6H  . sodium chloride flush  3 mL Intravenous Q12H      Madelon Lips, MD 06/17/2018, 9:00 AM

## 2018-06-17 NOTE — Progress Notes (Signed)
Visited last night but patient was not awake.  Stopped by to see her this morning hoping she would be awake and she was.  She mentioned she just feels miserable.  Her family was upset last night and was hoping for doctor to come by and give update about her.  Had prayer with patient and she asked for her feet to be covered as they had moved from under the blanket.   Conard Novak, Chaplain   06/17/18 0900  Clinical Encounter Type  Visited With Patient  Visit Type Follow-up;Spiritual support  Referral From Chaplain  Consult/Referral To Chaplain  Spiritual Encounters  Spiritual Needs Prayer  Stress Factors  Patient Stress Factors Health changes;Other (Comment) (feels miserable)

## 2018-06-18 ENCOUNTER — Inpatient Hospital Stay (HOSPITAL_COMMUNITY): Payer: Medicare Other

## 2018-06-18 LAB — RENAL FUNCTION PANEL
Albumin: 2.7 g/dL — ABNORMAL LOW (ref 3.5–5.0)
Anion gap: 13 (ref 5–15)
BUN: 85 mg/dL — ABNORMAL HIGH (ref 8–23)
CO2: 28 mmol/L (ref 22–32)
Calcium: 8.9 mg/dL (ref 8.9–10.3)
Chloride: 101 mmol/L (ref 98–111)
Creatinine, Ser: 5.87 mg/dL — ABNORMAL HIGH (ref 0.44–1.00)
GFR calc Af Amer: 7 mL/min — ABNORMAL LOW (ref >60)
GFR calc non Af Amer: 6 mL/min — ABNORMAL LOW (ref >60)
Glucose, Bld: 204 mg/dL — ABNORMAL HIGH (ref 70–99)
Phosphorus: 5.5 mg/dL — ABNORMAL HIGH (ref 2.5–4.6)
Potassium: 3.5 mmol/L (ref 3.5–5.1)
Sodium: 142 mmol/L (ref 135–145)

## 2018-06-18 LAB — CBC
HCT: 31.1 % — ABNORMAL LOW (ref 36.0–46.0)
Hemoglobin: 9.5 g/dL — ABNORMAL LOW (ref 12.0–15.0)
MCH: 30.4 pg (ref 26.0–34.0)
MCHC: 30.5 g/dL (ref 30.0–36.0)
MCV: 99.7 fL (ref 80.0–100.0)
NRBC: 0 % (ref 0.0–0.2)
Platelets: 239 10*3/uL (ref 150–400)
RBC: 3.12 MIL/uL — AB (ref 3.87–5.11)
RDW: 14.1 % (ref 11.5–15.5)
WBC: 15 10*3/uL — ABNORMAL HIGH (ref 4.0–10.5)

## 2018-06-18 LAB — GLUCOSE, CAPILLARY
GLUCOSE-CAPILLARY: 181 mg/dL — AB (ref 70–99)
Glucose-Capillary: 118 mg/dL — ABNORMAL HIGH (ref 70–99)
Glucose-Capillary: 158 mg/dL — ABNORMAL HIGH (ref 70–99)
Glucose-Capillary: 187 mg/dL — ABNORMAL HIGH (ref 70–99)

## 2018-06-18 MED ORDER — HYDROCODONE-ACETAMINOPHEN 5-325 MG PO TABS
0.5000 | ORAL_TABLET | Freq: Three times a day (TID) | ORAL | Status: DC | PRN
  Administered 2018-06-18: 0.5 via ORAL
  Filled 2018-06-18: qty 1

## 2018-06-18 MED ORDER — HYDROCODONE-ACETAMINOPHEN 5-325 MG PO TABS: 1.0000 | ORAL_TABLET | Freq: Three times a day (TID) | ORAL | Status: DC | PRN

## 2018-06-18 MED ORDER — ASPIRIN EC 81 MG PO TBEC
81.0000 mg | DELAYED_RELEASE_TABLET | Freq: Every day | ORAL | Status: DC
  Administered 2018-06-18 – 2018-06-25 (×8): 81 mg via ORAL
  Filled 2018-06-18 (×8): qty 1

## 2018-06-18 MED ORDER — LEVOTHYROXINE SODIUM 25 MCG PO TABS
125.0000 ug | ORAL_TABLET | Freq: Every day | ORAL | Status: DC
  Administered 2018-06-19 – 2018-06-25 (×7): 125 ug via ORAL
  Filled 2018-06-18 (×7): qty 1

## 2018-06-18 MED ORDER — ISOSORBIDE MONONITRATE ER 30 MG PO TB24
30.0000 mg | ORAL_TABLET | Freq: Every day | ORAL | Status: DC
  Administered 2018-06-18 – 2018-06-20 (×3): 30 mg via ORAL
  Filled 2018-06-18 (×3): qty 1

## 2018-06-18 MED ORDER — CLONIDINE HCL 0.1 MG PO TABS
0.1000 mg | ORAL_TABLET | Freq: Two times a day (BID) | ORAL | Status: DC
  Administered 2018-06-18 – 2018-06-21 (×5): 0.1 mg via ORAL
  Filled 2018-06-18 (×6): qty 1

## 2018-06-18 NOTE — Progress Notes (Signed)
PROGRESS NOTE  Paige Arnold SJG:[id number] DOB: 07/10/1941 DOA: 06/15/2018 PCP: Angelina Sheriff, MD   LOS: 3 days   Brief Narrative / Interim history: 77 y.o.femalew/ a hx of CKD stage IV, HTN, diabetes, obesity, GERD, osteoarthritis and chronic pain who reported progressive mild jerky movements concerning for possible seizures over a few weeks, followed by a 30-minute stage of general confusion. She was seen by her Neurologist who recommended an MRI and adjusted her medications (renal dosing) but her symptoms have gotten worse in the interim. Upon presentation to the ED she was agitated and confused. She was found to have a systolic of 654. CT head was normal. Exam noted no evidence of focal neurologic deficit. Her creatinine was 4.6 with a baseline creatinine of 3.6 about a month prior. Troponin steadily rose frome 0.3 > 0.9.  Cardiology recommended medical management with heparin and nitroglycerin for NSTEMI. Given her worsening renal fxn she was transferred to [facility].   Subjective: Significant improvement in mentation as compared to yesterday.  Mostly oriented.  No nausea no vomiting.  Following commands.  Assessment & Plan: Principal Problem:   Metabolic encephalopathy Active Problems:   Diabetes mellitus type 2 with complications, uncontrolled (HCC)   Chronic kidney disease (CKD), stage IV (severe) (HCC)   Hypertensive urgency   Hypothyroidism   Acute metabolic encephalopathy   NSTEMI (non-ST elevated myocardial infarction) (HCC)   Principal Problem Acute metabolic encephalopathy Likely combination of polypharmacy as well as hypertensive encephalopathy Also combination with possible pres to severe high blood pressure  -EEG obtained which showed generalized nonspecific cerebral dysfunction without seizure activity, very nonspecific -Likely combination of polypharmacy as well as hypertensive encephalopathy. Check MRI brain Discontinue morphine. Transition to Norco to avoid  morphine metabolites. Gradually increase Norco back to equivalent of 45 mg of morphine. Outpatient follow-up with pain management clinic   Additional Problems Acute kidney injury on chronic kidney disease stage IV-V -Baseline creatinine in the mid threes, currently in the 5 range.  Nephrology consulted, discussed with Dr. Hollie Salk, appreciate input and defer further management to nephrology  Type 2 diabetes mellitus, poorly controlled, with renal complication -Most recent A1c 7.5. -Continue sliding scale, hold home medications  Hypertensive urgency -Initial blood pressure at 220-240, status post Cardene drip utilized at [employer] -Continue clonidine patch and IV hydralazine here, she is lethargic at times and p.o. intake is not consistent  Chronic pain -Has been on morphine 3 times daily for a long time.  Her morphine is likely toxic in the setting of worsening renal function given excellent response to Narcan Received Narcan twice 06/17/2018 Daughter is at bedside and extremely vocal regarding withdrawals and she would not want her mother to go through withdrawals.   Discontinue morphine. Transition to Norco and monitor.  Recommend to avoid morphine going forward.  Elevated TSH -Continue home regimen and repeat TSH as an outpatient.  Acute diastolic CHF -Since admission at [facility] patient diuresed over 3 L of fluids, no previous heart history.  2D echo here normal LVEF.  Currently she is euvolemic  Dementia -Continue Aricept, apparently at baseline she is highly functional   Scheduled Meds: . aspirin EC  81 mg Oral Daily  . cloNIDine  0.1 mg Oral BID  . donepezil  10 mg Oral QHS  . DULoxetine  60 mg Oral Daily  . furosemide  80 mg Oral Daily  . heparin injection (subcutaneous)  5,000 Units Subcutaneous Q8H  . insulin aspart  0-9 Units Subcutaneous Q4H  .  isosorbide mononitrate  30 mg Oral Daily  . [START ON 06/19/2018] levothyroxine  125 mcg Oral Q0600  . mouth rinse  15 mL  Mouth Rinse BID  . rosuvastatin  10 mg Oral Daily  . sodium chloride flush  3 mL Intravenous Q12H   Continuous Infusions: . sodium chloride 10 mL/hr at 06/16/18 2000  . sodium chloride     PRN Meds:.sodium chloride, [DISCONTINUED] acetaminophen **OR** [DISCONTINUED] acetaminophen (TYLENOL) oral liquid 160 mg/5 mL **OR** acetaminophen, haloperidol lactate, HYDROcodone-acetaminophen, naLOXone (NARCAN)  injection, sodium chloride flush  DVT prophylaxis: heparin Code Status: Full code Family Communication: Extensive discussion with daughter at bedside Disposition Plan: Home when ready  Consultants:   Nephrology  Procedures:   2D echo Impressions: - Study limited to parasternal views only. Patient would not cooperate with the rest of the study.  Antimicrobials:  None    Objective: Vitals:   06/18/18 0130 06/18/18 0324 06/18/18 0759 06/18/18 1149  BP: (!) 162/60 (!) 120/43 (!) 115/46 (!) 160/73  Pulse:   (!) 52 70  Resp:  16 15 16   Temp:  98.2 F (36.8 C) 98.2 F (36.8 C)   TempSrc:  Axillary Axillary   SpO2:  96% 98% 94%  Weight:      Height:        Intake/Output Summary (Last 24 hours) at 06/18/2018 1940 Last data filed at 06/18/2018 0615 Gross per 24 hour  Intake 558 ml  Output 950 ml  Net -392 ml   Filed Weights   2018/07/14 1800  Weight: 87.2 kg    Examination:  Constitutional: Lethargic, alert after Narcan, interactive, Eyes: lids and conjunctivae normal ENMT: Mucous membranes are moist.  Neck: normal, supple Respiratory: clear to auscultation bilaterally, no wheezing, no crackles. Normal respiratory effort. No accessory muscle use.  Cardiovascular: Regular rate and rhythm, no murmurs / rubs / gallops. No LE edema. 2+ pedal pulses. No carotid bruits. Abdomen: no tenderness Musculoskeletal: no clubbing / cyanosis.  Skin: no rashes Neurologic: Nonfocal   Data Reviewed: I have independently reviewed following labs and imaging studies   CBC: Recent  Labs  Lab 07/14/2018 1958 06/16/18 0002 06/17/18 0225 06/18/18 0811  WBC 23.3* 21.3* 19.8* 15.0*  HGB 12.4 12.3 11.0* 9.5*  HCT 40.9 39.4 34.8* 31.1*  MCV 96.5 95.6 96.7 99.7  PLT 396 383 302 485   Basic Metabolic Panel: Recent Labs  Lab 07-14-2018 1958 06/16/18 1020 06/17/18 0225 06/18/18 0811  NA 142 145 142 142  K 3.7 3.6 3.2* 3.5  CL 100 103 101 101  CO2 25 28 26 28   GLUCOSE 198* 241* 158* 204*  BUN 66* 77* 83* 85*  CREATININE 4.59* 5.75* 5.58* 5.87*  CALCIUM 9.4 9.5 9.2 8.9  PHOS  --   --   --  5.5*   GFR: Estimated Creatinine Clearance: 8.9 mL/min (A) (by C-G formula based on SCr of 5.87 mg/dL (H)). Liver Function Tests: Recent Labs  Lab 07-14-18 1958 06/16/18 1020 06/17/18 0225 06/18/18 0811  AST 31 37 35  --   ALT 23 24 26   --   ALKPHOS 76 78 68  --   BILITOT 1.1 0.8 1.0  --   PROT 7.3 6.2* 5.9*  --   ALBUMIN 3.3* 3.3* 2.9* 2.7*   No results for input(s): LIPASE, AMYLASE in the last 168 hours. Recent Labs  Lab 06/16/18 1020  AMMONIA 16   Coagulation Profile: No results for input(s): INR, PROTIME in the last 168 hours. Cardiac Enzymes: Recent Labs  Lab 06/15/18 1958 06/16/18 0851 06/17/18 0608 06/17/18 1151  CKTOTAL  --  537*  --  503*  TROPONINI 0.33*  --  0.13*  --    BNP (last 3 results) No results for input(s): PROBNP in the last 8760 hours. HbA1C: Recent Labs    06/16/18 0002  HGBA1C 7.5*   CBG: Recent Labs  Lab 06/17/18 1700 06/17/18 1944 06/18/18 0011 06/18/18 0322 06/18/18 1148  GLUCAP 191* 165* 187* 118* 181*   Lipid Profile: Recent Labs    06/16/18 0002  CHOL 171  HDL 51  LDLCALC 85  TRIG 173*  CHOLHDL 3.4   Thyroid Function Tests: No results for input(s): TSH, T4TOTAL, FREET4, T3FREE, THYROIDAB in the last 72 hours. Anemia Panel: Recent Labs    06/16/18 1020  VITAMINB12 >7,500*  FOLATE 39.8  FERRITIN 252  TIBC 262  IRON 89  RETICCTPCT 1.6   Urine analysis:    Component Value Date/Time   COLORURINE  YELLOW 06/16/2018 [place] 06/16/2018 1035   LABSPEC 1.013 06/16/2018 1035   PHURINE 6.0 06/16/2018 1035   GLUCOSEU 50 (A) 06/16/2018 1035   HGBUR MODERATE (A) 06/16/2018 1035   BILIRUBINUR NEGATIVE 06/16/2018 1035   KETONESUR NEGATIVE 06/16/2018 1035   PROTEINUR >=300 (A) 06/16/2018 1035   UROBILINOGEN 0.2 03/09/2014 1331   NITRITE NEGATIVE 06/16/2018 1035   LEUKOCYTESUR NEGATIVE 06/16/2018 1035   Sepsis Labs: Invalid input(s): PROCALCITONIN, LACTICIDVEN  Recent Results (from the past 240 hour(s))  MRSA PCR Screening     Status: None   Collection Time: 06/15/18  8:00 PM  Result Value Ref Range Status   MRSA by PCR NEGATIVE NEGATIVE Final    Comment:        The GeneXpert MRSA Assay (FDA approved for NASAL specimens only), is one component of a comprehensive MRSA colonization surveillance program. It is not intended to diagnose MRSA infection nor to guide or monitor treatment for MRSA infections. Performed at [facility], [place] [address], [place], [place] [address]       Radiology Studies: Mr Brain 84 Contrast  Result Date: 06/18/2018 CLINICAL DATA:  77 y/o F; progressive mild jerking movements with concern for possible seizures. Altered mental status. Metabolic encephalopathy. EXAM: MRI HEAD WITHOUT CONTRAST TECHNIQUE: Axial DWI and axial T2 FLAIR sequences were acquired. The patient declined to continue the examination and additional sequences were not acquired. COMPARISON:  06/14/2018 CT head.  04/28/2013 MRI head. FINDINGS: Approximately 1 cm foci of reduced diffusion are present within the posterosuperior frontal lobes bilaterally (series 5 image 88 and 91). Additional possible punctate focus of reduced diffusion within the right anterolateral frontal lobe (series 5, image 84). Foci of reduced diffusion demonstrate associated increased T2 FLAIR hyperintense signal abnormality and are new from the prior MRI of the brain. Several nonspecific T2 FLAIR  hyperintensities in subcortical and periventricular white matter are compatible with mild chronic microvascular ischemic changes for age. Mild volume loss of the brain. No mass effect, appreciable extra-axial collection, hydrocephalus, or herniation. No abnormal diffusion or T2 FLAIR signal abnormality of calvarium, orbits, sinuses, or mastoid air cells. IMPRESSION: 1. Exam limited to DWI and T2 FLAIR sequences. 2. Approximately 1 cm foci of reduced diffusion in the posterosuperior frontal lobes and possible additional punctate focus in the right anterior frontal lobe. Findings probably represent acute/early subacute infarctions. Findings are atypical for seizure related activity. 3. Mild chronic microvascular ischemic changes and volume loss of the brain. These results will be called to the  ordering clinician or representative by the Radiologist Assistant, and communication documented in the PACS or zVision Dashboard. Electronically Signed   By: Kristine Garbe M.D.   On: 06/18/2018 17:09   US Renal  Result Date: 06/17/2018 CLINICAL DATA:  Chronic kidney disease EXAM: RENAL / URINARY TRACT ULTRASOUND COMPLETE COMPARISON:  CT abdomen pelvis 04/28/2018 FINDINGS: Right Kidney: Renal measurements: 12.1 x 4.4 x 5.2 cm = volume: 144 mL. Right kidney difficult to visualize due to increased echogenicity of the cortex and body habitus. Negative for mass or hydronephrosis. Left Kidney: Renal measurements: 8.5 x 4.5 x 3.8 cm = volume: 75 mL. Increased echogenicity of the left renal cortex. Echogenic suprarenal mass on the left measures 3 x 3.5 cm. This is similar to the prior CT which showed a fatty lesion immediately above the left kidney, separate from the adrenal gland. Negative for hydronephrosis. Bladder: Foley catheter IMPRESSION: 1. Negative for hydronephrosis 2. Small left kidney. Increased echogenicity of the renal cortex bilaterally 3. 3 x 3.5 cm suprarenal mass on the left unchanged from prior CT. This  also is unchanged from CT of 07/26/2014. 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 Electronically Signed   By: Franchot Gallo M.D.   On: 06/17/2018 11:20    Time spent: 40 minutes,   Author:  Berle Mull, MD [facility] Pager: [phone] 06/18/2018 7:43 PM     If 7PM-7AM, please contact night-coverage [email or web address] Password [email or web address] 06/18/2018, 7:40 PM

## 2018-06-18 NOTE — Progress Notes (Signed)
Physical Therapy Treatment Patient Details Name: Paige Arnold MRN: [id number] DOB: 18-Apr-1941 Today's Date: 06/18/2018    History of Present Illness 77 y.o. female with medical history significant of chronic kidney disease stage IV, hypertension, diabetes, morbid obesity, GERD, osteoarthritis and chronic pain who has had progressive mild jerky movements concerning for possible seizures over the last few weeks in addition to AMS. Patient presents now with metabolic encephalopathy, work up underway.    PT Comments    Patient seen again at request of RN/Family. Patient ambulating hallway with chair follow, cues for safety with RW. Patient not safe to ambulate without hands on assistance and continues with slow processing. Cont to rec SNF.    Follow Up Recommendations  SNF;Supervision/Assistance - 24 hour     Equipment Recommendations  None recommended by PT    Recommendations for Other Services       Precautions / Restrictions Precautions Precautions: Fall Restrictions Weight Bearing Restrictions: No    Mobility  Bed Mobility Overal bed mobility: Needs Assistance Bed Mobility: Supine to Sit     Supine to sit: Mod assist;HOB elevated        Transfers Overall transfer level: Needs assistance Equipment used: 2 person hand held assist Transfers: Sit to/from Stand Sit to Stand: Min assist;+2 physical assistance         General transfer comment: min A for stability.  Ambulation/Gait Ambulation/Gait assistance: Min assist;Min guard Gait Distance (Feet): 80 Feet Assistive device: Rolling walker (2 wheeled) Gait Pattern/deviations: Step-to pattern;Step-through pattern Gait velocity: decreasd   General Gait Details: pt ambulating very slowly with chair follow for safety. cues for BOS, use of RW. no overt LOB but unsafe to walk independently   Stairs             Wheelchair Mobility    Modified Rankin (Stroke Patients Only)       Balance Overall balance  assessment: Needs assistance Sitting-balance support: No upper extremity supported Sitting balance-Leahy Scale: Poor     Standing balance support: Bilateral upper extremity supported Standing balance-Leahy Scale: Poor                              Cognition Arousal/Alertness: Lethargic Behavior During Therapy: Flat affect Overall Cognitive Status: Impaired/Different from baseline                                        Exercises      General Comments        Pertinent Vitals/Pain Pain Assessment: Faces Faces Pain Scale: Hurts little more Pain Location: back Pain Descriptors / Indicators: Aching Pain Intervention(s): Limited activity within patient's tolerance    Home Living                      Prior Function            PT Goals (current goals can now be found in the care plan section) Acute Rehab PT Goals Patient Stated Goal: none stated PT Goal Formulation: With patient Time For Goal Achievement: 06/30/18 Potential to Achieve Goals: Good Progress towards PT goals: Progressing toward goals    Frequency    Min 2X/week      PT Plan Current plan remains appropriate    Co-evaluation              AM-PAC PT "6  Clicks" Mobility   Outcome Measure  Help needed turning from your back to your side while in a flat bed without using bedrails?: A Lot Help needed moving from lying on your back to sitting on the side of a flat bed without using bedrails?: A Lot Help needed moving to and from a bed to a chair (including a wheelchair)?: A Lot Help needed standing up from a chair using your arms (e.g., wheelchair or bedside chair)?: A Lot Help needed to walk in hospital room?: A Lot Help needed climbing 3-5 steps with a railing? : Total 6 Click Score: 11    End of Session Equipment Utilized During Treatment: Gait belt Activity Tolerance: Patient limited by fatigue;Patient limited by lethargy Patient left: in bed;with call  bell/phone within reach;with bed alarm set;with restraints reapplied;with nursing/sitter in room Nurse Communication: Mobility status PT Visit Diagnosis: Difficulty in walking, not elsewhere classified (R26.2);Other symptoms and signs involving the nervous system (R29.898)     Time: 1745-1810 PT Time Calculation (min) (ACUTE ONLY): 25 min  Charges:  $Gait Training: 8-22 mins $Therapeutic Activity: 8-22 mins                     Reinaldo Berber, PT, DPT Acute Rehabilitation Services Pager: [phone] Office: [place] 06/18/2018, 6:22 PM

## 2018-06-18 NOTE — Clinical Social Work Note (Signed)
Clinical Social Work Assessment  Patient Details  Name: Paige Arnold MRN: [id number] Date of Birth: 10-05-1940  Date of referral:  06/18/18               Reason for consult:  Facility Placement                Permission sought to share information with:  Family Supports Permission granted to share information::  Yes, Release of Information Signed  Name::     Airline pilot::  [facility], [place]  Relationship::  Daughter  Contact Information:     Housing/Transportation Living arrangements for the past 2 months:  Single Family Home Source of Information:  Adult Children Patient Interpreter Needed:  None Criminal Activity/Legal Involvement Pertinent to Current Situation/Hospitalization:  No - Comment as needed Significant Relationships:  Adult Children, Spouse Lives with:  Adult Children, Spouse Do you feel safe going back to the place where you live?  No Need for family participation in patient care:  No (Coment)  Care giving concerns:  Pt is disoriented to situation. CSW spoke with pt's daughter via telephone.   Social Worker assessment / plan:  CSW spoke with pts' daughter via telephone. Pt lives with spouse and daughter. Pt's daughter agreeable for pt to go to SNF at d/c and wants her to go to [facility] in [place]. CSW to send referral and follow up with facility.  Employment status:  Retired Nurse, adult PT Recommendations:  [place] / Referral to community resources:  [place]  Patient/Family's Response to care:  Pt's daugher verbalized understanding of CSW role and expressed appreciation for support. Pt's daugher denies any concern regarding pt care at this time.   Patient/Family's Understanding of and Emotional Response to Diagnosis, Current Treatment, and Prognosis:  Pt's daughter understanding and realistic regarding pt's physical limitations. Pt's daughter understands the need for pt to go to SNF at d/c.   Pt's daughter denies any concern regarding treatment plan at this time. CSW will continue to provide support and facilitate d/c needs.   Emotional Assessment Appearance:  Appears stated age Attitude/Demeanor/Rapport:  Unable to Assess Affect (typically observed):  Unable to Assess Orientation:  Oriented to Self, Oriented to Place, Oriented to  Time Alcohol / Substance use:  Not Applicable Psych involvement (Current and /or in the community):  No (Comment)  Discharge Needs  Concerns to be addressed:  Basic Needs, Care Coordination Readmission within the last 30 days:  No Current discharge risk:  Dependent with Mobility Barriers to Discharge:  Continued Medical Work up, [employer], LCSW 06/18/2018, 3:10 PM

## 2018-06-18 NOTE — Evaluation (Signed)
Speech Language Pathology Evaluation Patient Details Name: Paige Arnold MRN: [id number] DOB: Jun 07, 1941 Today's Date: 06/18/2018 Time: 2993-7169 SLP Time Calculation (min) (ACUTE ONLY): 10 min  Problem List:  Patient Active Problem List   Diagnosis Date Noted  . Hypertensive urgency 06/15/2018  . Metabolic encephalopathy 67/89/3810  . Hypothyroidism 06/15/2018  . Acute metabolic encephalopathy 17/51/0258  . NSTEMI (non-ST elevated myocardial infarction) ([place]) 06/15/2018  . Status post revision of total knee replacement, left 04/16/2017  . Cervicalgia 04/12/2016  . S/P total hip arthroplasty 08/12/2015  . Chronic postoperative pain 08/12/2015  . Chronic kidney disease (CKD), stage IV (severe) ([place]) 06/14/2015  . Reflex sympathetic dystrophy of left lower extremity 09/02/2014  . Instability of internal left knee prosthesis ([place]) 04/30/2014  . Acute kidney injury ([place]) 03/09/2014  . Diabetic nephropathy ([place]) 03/09/2014  . Diabetes mellitus type 2 with complications, uncontrolled ([place]) 03/09/2014  . AKI (acute kidney injury) ([place]) 03/09/2014  . CKD (chronic kidney disease), stage III ([place]) 03/09/2014  . Unspecified hypothyroidism 03/25/2013  . Unspecified essential hypertension 03/25/2013  . Diabetic neuropathy, painful ([place]) 02/26/2013  . Degeneration of lumbar or lumbosacral intervertebral disc 02/26/2013  . Spinal stenosis, lumbar region, without neurogenic claudication 02/26/2013  . Right knee pain 11/12/2012  . Medication monitoring encounter 11/12/2012  . Medication refill 11/12/2012  . Chronic pain of left knee 10/08/2011  . Chronic low back pain 10/08/2011  . Trochanteric bursitis of both hips 10/08/2011   Past Medical History:  Past Medical History:  Diagnosis Date  . Arthritis   . Diabetes mellitus   . Diabetic neuropathy ([place])   . GERD (gastroesophageal reflux disease)   . Hypertension   . Kidney disease   . Pneumonia Oct. 2016  . Shortness of breath dyspnea     when I first lay down, then it gets better  . Sleep apnea    does not wear CPAP   . Thyroid disorder    Past Surgical History:  Past Surgical History:  Procedure Laterality Date  . A/V FISTULAGRAM N/A 09/20/2017   Procedure: A/V FISTULAGRAM - left arm;  Surgeon: Angelia Mould, MD;  Location: [facility];  Service: Cardiovascular;  Laterality: N/A;  . AV FISTULA PLACEMENT Left 02/16/2015   Procedure: LEFT ARM BRACHIOCEPHALIC (AV) FISTULA CREATION;  Surgeon: Mal Misty, MD;  Location: [facility];  Service: Vascular;  Laterality: Left;  . CHOLECYSTECTOMY    . INTRAOCULAR LENS INSERTION Right 10-21-13  . LIGATION OF COMPETING BRANCHES OF ARTERIOVENOUS FISTULA Left 02/16/2015   Procedure: LIGATION OF COMPETING BRANCHES OF LEFT BRACHIOCEPHALIC ARTERIOVENOUS FISTULA;  Surgeon: Mal Misty, MD;  Location: [facility];  Service: Vascular;  Laterality: Left;  . PARTIAL HIP ARTHROPLASTY    . PARTIAL KNEE ARTHROPLASTY     left  . PERIPHERAL VASCULAR BALLOON ANGIOPLASTY Left 09/20/2017   Procedure: PERIPHERAL VASCULAR BALLOON ANGIOPLASTY;  Surgeon: Angelia Mould, MD;  Location: [facility];  Service: Cardiovascular;  Laterality: Left;  left fistulagram  . TEMPOROMANDIBULAR JOINT SURGERY    . TONSILLECTOMY    . TUBAL LIGATION     HPI:  Paige Arnold is a 77 y.o. female with PMH significant of CKD stage IV, HTN, DM, morbid obesity, GERD, PNA (2016), and chronic pain who has had progressive mild jerky movements concerning for possible seizures over the last few weeks. Pt transferred from [facility] to [facility] 06/15/18 with agitation, confusion, systolic blood pressure about 240. Patient presents now with metabolic encephalopathy, work up underway.  Yale swallow performed 12/1, pt noted to be lethargic and disoriented.   Assessment / Plan / Recommendation Clinical Impression   Pt presents with moderate cognitive deficits.  Pt's daughter reports that pt's memory was "fuzzy" at baseline and she  would fill prescriptions and organize a pill box for pt.  Despite baseline deficits, daughter endorses that pt's mentation is altered.  Pt was oriented to place and general date but not situation and she had difficulty remembering where she lived.  Pt needed mod cues for delayed recall tasks and min cues for task organization during clock drawing assessment.  Pt was inattentive and restless throughout evaluation with complaints of back pain and currently has a telesitter for safety due to impulsivity.  Given family's reports of altered mental status, pt would benefit from skilled ST for cognition while inpatient in order to maximize functional independence and reduce burden of care prior to discharge.      SLP Assessment  SLP Recommendation/Assessment: Patient needs continued Speech Lanaguage Pathology Services SLP Visit Diagnosis: Dysphagia, unspecified (R13.10);Cognitive communication deficit (R41.841)    Follow Up Recommendations  24 hour supervision/assistance    Frequency and Duration min 1 x/week         SLP Evaluation Cognition  Overall Cognitive Status: Impaired/Different from baseline Arousal/Alertness: Awake/alert Orientation Level: Oriented to person;Oriented to place;Oriented to time;Disoriented to situation Attention: Sustained Sustained Attention: Impaired Sustained Attention Impairment: Functional basic Memory: Impaired Memory Impairment: Retrieval deficit Awareness: Impaired Awareness Impairment: Intellectual impairment Problem Solving: Impaired Problem Solving Impairment: Functional basic Behaviors: Restless Safety/Judgment: Impaired       Comprehension  Auditory Comprehension Overall Auditory Comprehension: Appears within functional limits for tasks assessed    Expression Expression Primary Mode of Expression: Verbal Verbal Expression Overall Verbal Expression: Appears within functional limits for tasks assessed   Oral / Motor  Oral Motor/Sensory  Function Overall Oral Motor/Sensory Function: Within functional limits Motor Speech Overall Motor Speech: Appears within functional limits for tasks assessed   GO                    Emilio Math 06/18/2018, 2:44 PM

## 2018-06-18 NOTE — Progress Notes (Signed)
  Speech Language Pathology Treatment: Dysphagia  Patient Details Name: Paige Arnold MRN: [id number] DOB: 1940/12/25 Today's Date: 06/18/2018 Time: 5945-8592 SLP Time Calculation (min) (ACUTE ONLY): 13 min  Assessment / Plan / Recommendation Clinical Impression  Pt was seen for skilled ST targeting dysphagia goals.  Pt was awake and alert upon therapist's arrival, confused but agreeable to participate in therapy.  Pt consumed dys 1 textures and thin liquids with no overt s/s of aspiration and WFL oral phase for containment and clearance of boluses.  As a result, SLP facilitated the session with trials of advanced consistencies to continue working towards diet progression.  Pt had adequate mastication of advanced solids with no overt s/s of aspiration.  As a result, recommend advancing pt to dys 2 textures and thin liquids with full supervision for use of swallowing precautions.  Discussed recommendations with pt's family who was present and they were in agreement with plan of care.  Pt left in bed with family at bedside.   HPI HPI: Paige Arnold is a 77 y.o. female with PMH significant of CKD stage IV, HTN, DM, morbid obesity, GERD, PNA (2016), and chronic pain who has had progressive mild jerky movements concerning for possible seizures over the last few weeks. Pt [facility] to [facility] 06/15/18 with agitation, confusion, systolic blood pressure about 240. Patient presents now with metabolic encephalopathy, work up underway. Yale swallow performed 12/1, pt noted to be lethargic and disoriented.      SLP Plan  Continue with current plan of care  Patient needs continued Speech Lanaguage Pathology Services    Recommendations  Diet recommendations: Dysphagia 2 (fine chop);Thin liquid Liquids provided via: Cup;Straw Medication Administration: Whole meds with puree Supervision: Full supervision/cueing for compensatory strategies Compensations: Minimize environmental distractions;Slow  rate;Small sips/bites Postural Changes and/or Swallow Maneuvers: Out of bed for meals;Seated upright 90 degrees;Upright 30-60 min after meal                Follow up Recommendations: 24 hour supervision/assistance SLP Visit Diagnosis: Dysphagia, unspecified (R13.10);Cognitive communication deficit (R41.841) Plan: Continue with current plan of care       GO                Paige Arnold, Selinda Orion 06/18/2018, 2:55 PM

## 2018-06-18 NOTE — Progress Notes (Signed)
Physical Therapy Treatment Patient Details Name: Paige Arnold MRN: [id number] DOB: [birth year] Today's Date: 06/18/2018    History of Present Illness 77 y.o. female with medical history significant of chronic kidney disease stage IV, hypertension, diabetes, morbid obesity, GERD, osteoarthritis and chronic pain who has had progressive mild jerky movements concerning for possible seizures over the last few weeks in addition to AMS. Patient presents now with metabolic encephalopathy, work up underway.    PT Comments    Patient more alert than prior PT session, however still cognitive deficits noted at this time, with slow processing and flat affect. Patient reports she feels terrible with generalized discomfort. Still with physical assistance to stand and hands on assistance for guarding. Will cont to follow and progress as able, rec SNF at this time.     Follow Up Recommendations  SNF;Supervision/Assistance - 24 hour     Equipment Recommendations  None recommended by PT    Recommendations for Other Services       Precautions / Restrictions Precautions Precautions: Fall Restrictions Weight Bearing Restrictions: No    Mobility  Bed Mobility Overal bed mobility: Needs Assistance Bed Mobility: Supine to Sit     Supine to sit: Mod assist;HOB elevated        Transfers Overall transfer level: Needs assistance Equipment used: 2 person hand held assist Transfers: Sit to/from Stand Sit to Stand: Min assist;+2 physical assistance         General transfer comment: Assist for stability during power up to standing  Ambulation/Gait Ambulation/Gait assistance: Min assist;Mod assist;+2 physical assistance Gait Distance (Feet): 10 Feet Assistive device: 2 person hand held assist           Stairs             Wheelchair Mobility    Modified Rankin (Stroke Patients Only)       Balance Overall balance assessment: Needs assistance Sitting-balance support: No upper  extremity supported Sitting balance-Leahy Scale: Poor     Standing balance support: Bilateral upper extremity supported Standing balance-Leahy Scale: Poor                              Cognition Arousal/Alertness: Lethargic Behavior During Therapy: Flat affect Overall Cognitive Status: No family/caregiver present to determine baseline cognitive functioning                                        Exercises      General Comments        Pertinent Vitals/Pain Pain Assessment: No/denies pain    Home Living                      Prior Function            PT Goals (current goals can now be found in the care plan section) Acute Rehab PT Goals Patient Stated Goal: none stated PT Goal Formulation: With patient Time For Goal Achievement: 06/30/18 Potential to Achieve Goals: Good Progress towards PT goals: Progressing toward goals    Frequency    Min 2X/week      PT Plan Current plan remains appropriate    Co-evaluation              AM-PAC PT "6 Clicks" Mobility   Outcome Measure  End of Session Equipment Utilized During Treatment: Gait belt Activity Tolerance: Patient limited by fatigue;Patient limited by lethargy Patient left: in bed;with call bell/phone within reach;with bed alarm set;with restraints reapplied;with nursing/sitter in room Nurse Communication: Mobility status PT Visit Diagnosis: Difficulty in walking, not elsewhere classified (R26.2);Other symptoms and signs involving the nervous system (R29.898)     Time: 0940-1000 PT Time Calculation (min) (ACUTE ONLY): 20 min  Charges:  $Gait Training: 8-22 mins                     Reinaldo Berber, PT, DPT Acute Rehabilitation Services Pager: [phone] Office: [phone]     Reinaldo Berber 06/18/2018, 11:10 AM

## 2018-06-18 NOTE — Progress Notes (Signed)
[facility] Progress Note    Assessment/ Plan:   1. Acute on chronic kidney disease stage V: Cr up to 5 initially on [place], initially improved to 4.6 with treatment of blood pressure and acute diastolic CHF and now up to 5.5.  [place] with some blood and RBCs and WBCs- will order culture.  CK mildly elevated at 537- recheck better at ~500.  Ordered renal US- no obstruction.  May need to c/s VVS here for AVG placement depending on neuro w/u.  2.  Acute encephalopathy/ ? seizures: CT head negative, need MRI per primary.  Getting HIV and RPR (neg), off gabapentin on MAR.  Ammonia 16, Tylenol and salicylate levels negative--> but pt was admitted 11/30, LFTs normal.  Minimal narcs on MAR.  Possible combo of hypertensive encephalopathy and polypharmacy (see below).  Routine EEG without seizure activity.  Carotid US without obvious stenosis.  3.  Hypertensive urgency: on nitropaste, has transitioned to clonidine patch and hydralazine prn--> of note, inderal was started 11/27 but don't favor that medication for control of BP.  OP meds list only clonidine and Lasix.  Will switch from nitropaste to Imdur today and from clonidine patch to PO now that is eating and drinking due to mild bradycardia.    4.  Acute diastolic CHF exacerbation: TTE at [facility] with concentric LVH, TTE here suboptimal study but EF 65-70% without WMA .  Lasix as above.  5.  NSTEMI: s/p hep gtt (? Demand ischemia), per primary.  6.  Polypharmacy: off gabapentin, getting too much morhpine- has been reduced  7.  Dispo: pending  Subjective:    Required Narcan x 2 yesterday.  Morphine off MAR.  Had long discussion with pt's dtr yesterday--> has noticed a decline over the past 6 months.  Sleeping more, poor appetite, and has commented that food doesn't taste right.  Discussed systematic approach to evaluating decline.  I think that if minimal narcs on MAR and all other etiologies of encephalopathy addressed (MRI only  outstanding issue) that she'll probably need to start dialysis.  This AM pt is "sleepy", is arousable. Some mild bradycardia noted on monitor.    Objective:   BP (!) 115/46 (BP Location: Right Arm)   Pulse (!) 52   Temp 98.2 F (36.8 C) (Axillary)   Resp 15   Ht 5\' 5"  (1.651 m)   Wt 87.2 kg   SpO2 98%   BMI 31.99 kg/m   Intake/Output Summary (Last 24 hours) at 06/18/2018 6761 Last data filed at 06/18/2018 0615 Gross per 24 hour  Intake 558 ml  Output 950 ml  Net -392 ml   Weight change:   Physical Exam: Gen: chronically ill-appearing CVS: slightly bradycardic Resp: clear bilaterally Abd: soft, nontender Ext: no LE edema Neuro: arousable, "sleepy"   Imaging: US Renal  Result Date: 06/17/2018 CLINICAL DATA:  Chronic kidney disease EXAM: RENAL / URINARY TRACT ULTRASOUND COMPLETE COMPARISON:  CT abdomen pelvis 04/28/2018 FINDINGS: Right Kidney: Renal measurements: 12.1 x 4.4 x 5.2 cm = volume: 144 mL. Right kidney difficult to visualize due to increased echogenicity of the cortex and body habitus. Negative for mass or hydronephrosis. Left Kidney: Renal measurements: 8.5 x 4.5 x 3.8 cm = volume: 75 mL. Increased echogenicity of the left renal cortex. Echogenic suprarenal mass on the left measures 3 x 3.5 cm. This is similar to the prior CT which showed a fatty lesion immediately above the left kidney, separate from the adrenal gland. Negative for hydronephrosis. Bladder: Foley catheter  IMPRESSION: 1. Negative for hydronephrosis 2. Small left kidney. Increased echogenicity of the renal cortex bilaterally 3. 3 x 3.5 cm suprarenal mass on the left unchanged from prior CT. This also is unchanged from CT of 07/26/2014. 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 Electronically Signed   By: Franchot Gallo M.D.   On: 06/17/2018 11:20   Vas US Carotid (at [place] Only)  Result Date: 06/17/2018 Carotid Arterial Duplex Study Indications: CVA. Limitations: Patient unable to cooperate  for optimal imaging Performing Technologist: Toma Copier RVS  Examination Guidelines: A complete evaluation includes B-mode imaging, spectral Doppler, color Doppler, and power Doppler as needed of all accessible portions of each vessel. Bilateral testing is considered an integral part of a complete examination. Limited examinations for reoccurring indications may be performed as noted.  Right Carotid Findings: +----------+--------+--------+--------+--------+-----------------------+           PSV cm/sEDV cm/sStenosisDescribeComments                +----------+--------+--------+--------+--------+-----------------------+ CCA Prox  90      4                       mild intimal thickening +----------+--------+--------+--------+--------+-----------------------+ CCA Distal67      10                      mild intimal thickening +----------+--------+--------+--------+--------+-----------------------+ ICA Prox  40      6                       mild intimal thickening +----------+--------+--------+--------+--------+-----------------------+ ICA Distal45      9                       tortuous                +----------+--------+--------+--------+--------+-----------------------+ ECA       96      10                                              +----------+--------+--------+--------+--------+-----------------------+ +----------+--------+-------+--------+-------------------+           PSV cm/sEDV cmsDescribeArm Pressure (mmHG) +----------+--------+-------+--------+-------------------+ [id number]                                         +----------+--------+-------+--------+-------------------+ +---------+--------+--+--------+--+ VertebralPSV cm/s45EDV cm/s10 +---------+--------+--+--------+--+ Technically difficult due to patient being unable to cooperate for optimal imaging  Left Carotid Findings:  +----------+--------+--------+--------+--------+--------------------------+           PSV cm/sEDV cm/sStenosisDescribeComments                   +----------+--------+--------+--------+--------+--------------------------+ CCA Prox  74      17                      mild intimal thickening    +----------+--------+--------+--------+--------+--------------------------+ CCA Distal68      12                      minimal intimal thickening +----------+--------+--------+--------+--------+--------------------------+ ICA Prox  60      11                                                 +----------+--------+--------+--------+--------+--------------------------+  ICA Distal93      15                      tortuous                   +----------+--------+--------+--------+--------+--------------------------+ ECA       84      3                                                  +----------+--------+--------+--------+--------+--------------------------+ +----------+--------+--------+--------+-------------------+ SubclavianPSV cm/sEDV cm/sDescribeArm Pressure (mmHG) +----------+--------+--------+--------+-------------------+           165                                         +----------+--------+--------+--------+-------------------+ +---------+--------+--+--------+-+ VertebralPSV cm/s42EDV cm/s7 +---------+--------+--+--------+-+ Technically difficult due to patient being unable to cooperate for optimal imaging  Summary: Right Carotid: There is no obvious evidence of stenosis in the right ICA. Left Carotid: There is no obvious evidence of stenosis in the left ICA. Vertebrals:  Bilateral vertebral arteries demonstrate antegrade flow. Subclavians: Normal flow hemodynamics were seen in bilateral subclavian              arteries. *See table(s) above for measurements and observations.  Electronically signed by Antony Contras MD on 06/17/2018 at 8:03:01 AM.    Final      Labs: BMET Recent Labs  Lab 06/15/18 1958 06/16/18 1020 06/17/18 0225 06/18/18 0811  NA 142 145 142 142  K 3.7 3.6 3.2* 3.5  CL 100 103 101 101  CO2 25 28 26 28   GLUCOSE 198* 241* 158* 204*  BUN 66* 77* 83* 85*  CREATININE 4.59* 5.75* 5.58* 5.87*  CALCIUM 9.4 9.5 9.2 8.9  PHOS  --   --   --  5.5*   CBC Recent Labs  Lab 06/15/18 1958 06/16/18 0002 06/17/18 0225 06/18/18 0811  WBC 23.3* 21.3* 19.8* 15.0*  HGB 12.4 12.3 11.0* 9.5*  HCT 40.9 39.4 34.8* 31.1*  MCV 96.5 95.6 96.7 99.7  PLT 396 383 302 239    Medications:    . aspirin  300 mg Rectal Daily  . cloNIDine  0.2 mg Transdermal Weekly  . donepezil  10 mg Oral QHS  . DULoxetine  60 mg Oral Daily  . furosemide  80 mg Oral Daily  . heparin injection (subcutaneous)  5,000 Units Subcutaneous Q8H  . insulin aspart  0-9 Units Subcutaneous Q4H  . levothyroxine  75 mcg Intravenous Daily  . mouth rinse  15 mL Mouth Rinse BID  . nitroGLYCERIN  1 inch Topical Q6H  . rosuvastatin  10 mg Oral Daily  . sodium chloride flush  3 mL Intravenous Q12H      Madelon Lips, MD 06/18/2018, 9:52 AM

## 2018-06-18 NOTE — NC FL2 (Signed)
[place] LEVEL OF CARE SCREENING TOOL     IDENTIFICATION  Patient Name: Paige Arnold Birthdate: Nov 27, 1940 Sex: female Admission Date (Current Location): 06/15/2018  [facility] and [place] Number:  Herbalist and Address:  [facility], [place] [address] [place], [place] [address]      Provider Number: [id number]  Attending Physician Name and Address:  Lavina Hamman, MD  Relative Name and Phone Number:       Current Level of Care: Hospital Recommended Level of Care: [place] Prior Approval Number:    Date Approved/Denied:   PASRR Number:    Discharge Plan: SNF    Current Diagnoses: Patient Active Problem List   Diagnosis Date Noted  . Hypertensive urgency 06/15/2018  . Metabolic encephalopathy 01/77/9390  . Hypothyroidism 06/15/2018  . Acute metabolic encephalopathy 30/03/2329  . NSTEMI (non-ST elevated myocardial infarction) ([place]) 06/15/2018  . Status post revision of total knee replacement, left 04/16/2017  . Cervicalgia 04/12/2016  . S/P total hip arthroplasty 08/12/2015  . Chronic postoperative pain 08/12/2015  . Chronic kidney disease (CKD), stage IV (severe) ([place]) 06/14/2015  . Reflex sympathetic dystrophy of left lower extremity 09/02/2014  . Instability of internal left knee prosthesis ([place]) 04/30/2014  . Acute kidney injury ([place]) 03/09/2014  . Diabetic nephropathy ([place]) 03/09/2014  . Diabetes mellitus type 2 with complications, uncontrolled ([place]) 03/09/2014  . AKI (acute kidney injury) ([place]) 03/09/2014  . CKD (chronic kidney disease), stage III ([place]) 03/09/2014  . Unspecified hypothyroidism 03/25/2013  . Unspecified essential hypertension 03/25/2013  . Diabetic neuropathy, painful ([place]) 02/26/2013  . Degeneration of lumbar or lumbosacral intervertebral disc 02/26/2013  . Spinal stenosis, lumbar region, without neurogenic claudication 02/26/2013  . Right knee pain 11/12/2012  . Medication  monitoring encounter 11/12/2012  . Medication refill 11/12/2012  . Chronic pain of left knee 10/08/2011  . Chronic low back pain 10/08/2011  . Trochanteric bursitis of both hips 10/08/2011    Orientation RESPIRATION BLADDER Height & Weight     Self, Place  O2(Nasal Cannula 2L) Continent Weight: 192 lb 3.9 oz (87.2 kg) Height:  5\' 5"  (165.1 cm)  BEHAVIORAL SYMPTOMS/MOOD NEUROLOGICAL BOWEL NUTRITION STATUS      Continent Diet(DYS 1, thin liquids)  AMBULATORY STATUS COMMUNICATION OF NEEDS Skin   Extensive Assist Verbally Normal                       Personal Care Assistance Level of Assistance  Bathing, Feeding, Dressing Bathing Assistance: Maximum assistance Feeding assistance: Limited assistance Dressing Assistance: Maximum assistance     Functional Limitations Info  Sight, Hearing, Speech Sight Info: Adequate Hearing Info: Adequate Speech Info: Adequate    SPECIAL CARE FACTORS FREQUENCY  OT (By licensed OT), PT (By licensed PT)     PT Frequency: 2x OT Frequency: 2x            Contractures Contractures Info: Not present    Additional Factors Info  Code Status, Allergies Code Status Info: Full Code Allergies Info: Oxycodone, Sulfa Antibiotics           Current Medications (06/18/2018):  This is the current hospital active medication list Current Facility-Administered Medications  Medication Dose Route Frequency Provider Last Rate Last Dose  . 0.9 %  sodium chloride infusion   Intravenous PRN Cherene Altes, MD 10 mL/hr at 06/16/18 2000    . 0.9 %  sodium chloride infusion   Intravenous Continuous Joette Catching  T, MD      . acetaminophen (TYLENOL) suppository 325 mg  325 mg Rectal Q4H PRN Cherene Altes, MD   325 mg at 06/18/18 0649  . aspirin EC tablet 81 mg  81 mg Oral Daily Lavina Hamman, MD   81 mg at 06/18/18 1127  . cloNIDine (CATAPRES) tablet 0.1 mg  0.1 mg Oral BID Madelon Lips, MD      . donepezil (ARICEPT) tablet 10 mg  10 mg  Oral QHS Elwyn Reach, MD   10 mg at 06/17/18 2206  . DULoxetine (CYMBALTA) DR capsule 60 mg  60 mg Oral Daily Caren Griffins, MD   60 mg at 06/18/18 1005  . furosemide (LASIX) tablet 80 mg  80 mg Oral Daily Madelon Lips, MD   80 mg at 06/18/18 1005  . haloperidol lactate (HALDOL) injection 2-5 mg  2-5 mg Intravenous Q6H PRN Cherene Altes, MD   5 mg at 06/16/18 1359  . heparin injection 5,000 Units  5,000 Units Subcutaneous Q8H Cherene Altes, MD   5,000 Units at 06/18/18 1406  . HYDROcodone-acetaminophen (NORCO/VICODIN) 5-325 MG per tablet 0.5 tablet  0.5 tablet Oral Q8H PRN Lavina Hamman, MD   0.5 tablet at 06/18/18 1127  . insulin aspart (novoLOG) injection 0-9 Units  0-9 Units Subcutaneous Q4H Cherene Altes, MD   2 Units at 06/18/18 1406  . isosorbide mononitrate (IMDUR) 24 hr tablet 30 mg  30 mg Oral Daily Madelon Lips, MD   30 mg at 06/18/18 1127  . [START ON 06/19/2018] levothyroxine (SYNTHROID, LEVOTHROID) tablet 125 mcg  125 mcg Oral Q0600 Lavina Hamman, MD      . MEDLINE mouth rinse  15 mL Mouth Rinse BID Joette Catching T, MD   15 mL at 06/18/18 1007  . naloxone [facility]) injection 0.2 mg  0.2 mg Intravenous Q1H PRN Caren Griffins, MD   0.2 mg at 06/17/18 1407  . rosuvastatin (CRESTOR) tablet 10 mg  10 mg Oral Daily Caren Griffins, MD   10 mg at 06/18/18 1005  . sodium chloride flush (NS) 0.9 % injection 3 mL  3 mL Intravenous Q12H Gala Romney L, MD   3 mL at 06/18/18 1007  . sodium chloride flush (NS) 0.9 % injection 3 mL  3 mL Intravenous PRN Elwyn Reach, MD         Discharge Medications: Please see discharge summary for a list of discharge medications.  Relevant Imaging Results:  Relevant Lab Results:   Additional Information SSN; [id number]  Eileen Stanford, LCSW

## 2018-06-18 NOTE — Care Management Important Message (Signed)
Important Message  Patient Details  Name: Paige Arnold MRN: [id number] Date of Birth: [birth year]   Medicare Important Message Given:  Yes    Orbie Pyo 06/18/2018, 10:23 AM

## 2018-06-18 NOTE — Progress Notes (Signed)
Limited exam obtained in MRI. Pt wanted to come out of scanner due to a headache and she wanted to stop.  Images sent to Radiologist to review and read.

## 2018-06-19 ENCOUNTER — Inpatient Hospital Stay (HOSPITAL_COMMUNITY): Payer: Medicare Other

## 2018-06-19 DIAGNOSIS — N185 Chronic kidney disease, stage 5: Secondary | ICD-10-CM

## 2018-06-19 LAB — GLUCOSE, CAPILLARY
GLUCOSE-CAPILLARY: 141 mg/dL — AB (ref 70–99)
Glucose-Capillary: 128 mg/dL — ABNORMAL HIGH (ref 70–99)
Glucose-Capillary: 138 mg/dL — ABNORMAL HIGH (ref 70–99)
Glucose-Capillary: 148 mg/dL — ABNORMAL HIGH (ref 70–99)
Glucose-Capillary: 165 mg/dL — ABNORMAL HIGH (ref 70–99)
Glucose-Capillary: 165 mg/dL — ABNORMAL HIGH (ref 70–99)
Glucose-Capillary: 172 mg/dL — ABNORMAL HIGH (ref 70–99)
Glucose-Capillary: 175 mg/dL — ABNORMAL HIGH (ref 70–99)

## 2018-06-19 LAB — CBC WITH DIFFERENTIAL/PLATELET
Abs Immature Granulocytes: 0.07 10*3/uL (ref 0.00–0.07)
BASOS ABS: 0.1 10*3/uL (ref 0.0–0.1)
Basophils Relative: 0 %
EOS PCT: 3 %
Eosinophils Absolute: 0.3 10*3/uL (ref 0.0–0.5)
HCT: 28.6 % — ABNORMAL LOW (ref 36.0–46.0)
Hemoglobin: 8.8 g/dL — ABNORMAL LOW (ref 12.0–15.0)
Immature Granulocytes: 1 %
Lymphocytes Relative: 11 %
Lymphs Abs: 1.4 10*3/uL (ref 0.7–4.0)
MCH: 30 pg (ref 26.0–34.0)
MCHC: 30.8 g/dL (ref 30.0–36.0)
MCV: 97.6 fL (ref 80.0–100.0)
Monocytes Absolute: 0.9 10*3/uL (ref 0.1–1.0)
Monocytes Relative: 7 %
Neutro Abs: 9.4 10*3/uL — ABNORMAL HIGH (ref 1.7–7.7)
Neutrophils Relative %: 78 %
Platelets: 234 10*3/uL (ref 150–400)
RBC: 2.93 MIL/uL — ABNORMAL LOW (ref 3.87–5.11)
RDW: 13.8 % (ref 11.5–15.5)
WBC: 12.1 10*3/uL — ABNORMAL HIGH (ref 4.0–10.5)
nRBC: 0 % (ref 0.0–0.2)

## 2018-06-19 LAB — COMPREHENSIVE METABOLIC PANEL
ALT: 27 U/L (ref 0–44)
AST: 25 U/L (ref 15–41)
Albumin: 2.6 g/dL — ABNORMAL LOW (ref 3.5–5.0)
Alkaline Phosphatase: 57 U/L (ref 38–126)
Anion gap: 14 (ref 5–15)
BILIRUBIN TOTAL: 0.9 mg/dL (ref 0.3–1.2)
BUN: 86 mg/dL — ABNORMAL HIGH (ref 8–23)
CO2: 25 mmol/L (ref 22–32)
Calcium: 8.6 mg/dL — ABNORMAL LOW (ref 8.9–10.3)
Chloride: 101 mmol/L (ref 98–111)
Creatinine, Ser: 5.7 mg/dL — ABNORMAL HIGH (ref 0.44–1.00)
GFR calc Af Amer: 8 mL/min — ABNORMAL LOW (ref >60)
GFR calc non Af Amer: 7 mL/min — ABNORMAL LOW (ref >60)
Glucose, Bld: 169 mg/dL — ABNORMAL HIGH (ref 70–99)
Potassium: 3.1 mmol/L — ABNORMAL LOW (ref 3.5–5.1)
Sodium: 140 mmol/L (ref 135–145)
TOTAL PROTEIN: 5.4 g/dL — AB (ref 6.5–8.1)

## 2018-06-19 LAB — MAGNESIUM: Magnesium: 2 mg/dL (ref 1.7–2.4)

## 2018-06-19 MED ORDER — HYDROCODONE-ACETAMINOPHEN 5-325 MG PO TABS
1.0000 | ORAL_TABLET | Freq: Three times a day (TID) | ORAL | Status: DC | PRN
  Administered 2018-06-19 – 2018-06-21 (×5): 1 via ORAL
  Filled 2018-06-19 (×6): qty 1

## 2018-06-19 MED ORDER — LORAZEPAM 2 MG/ML IJ SOLN: 0.5000 mg | INTRAMUSCULAR | Status: DC | PRN

## 2018-06-19 MED ORDER — POTASSIUM CHLORIDE CRYS ER 20 MEQ PO TBCR
40.0000 meq | EXTENDED_RELEASE_TABLET | Freq: Once | ORAL | Status: AC
  Administered 2018-06-19: 40 meq via ORAL
  Filled 2018-06-19: qty 2

## 2018-06-19 MED ORDER — POTASSIUM CHLORIDE CRYS ER 20 MEQ PO TBCR: 20.0000 meq | EXTENDED_RELEASE_TABLET | Freq: Once | ORAL | Status: DC

## 2018-06-19 NOTE — H&P (View-Only) (Signed)
Hospital Consult    Reason for Consult:  In need of dialysis access Requesting Physician:  Hollie Salk  MRN #:  [id number]  History of Present Illness: This is a 77 y.o. female who previously underwent a left brachiocephalic AVF on 09/20/44 for CKD 4.   She subsequently underwent a fistulogram with venoplasty on 09/20/17.   She was seen back in the office by Dr. Scot Dock in April 2019 and was noted to have some pulsatility in her fistula and the duplex suggested a stenosis in the proximal fistula.  He recommended proceeding with fistulogram to to address the proximal stenosis, however, the pt and her husband had concerns over cost of the procedure.  He also explained the alternative would be to ligate the fistula and place a graft.    She was admitted to the hospital on 06/15/18 and was found to be agitated and confused.  She was hypertensive with systolic of 659.  She did have a NSTEMI  She also had elevated BNP.  Her renal function had also worsened.  Vascular surgery is consulted for graft placement while here, but renal doesn't necessarily thinks she needs to start dialysis quite yet.    The pt is on a statin for cholesterol management.  She is on medication for diabetes.    Past Medical History:  Diagnosis Date  . Arthritis   . Diabetes mellitus   . Diabetic neuropathy ([place])   . GERD (gastroesophageal reflux disease)   . Hypertension   . Kidney disease   . Pneumonia Oct. 2016  . Shortness of breath dyspnea    when I first lay down, then it gets better  . Sleep apnea    does not wear CPAP   . Thyroid disorder     Past Surgical History:  Procedure Laterality Date  . A/V FISTULAGRAM N/A 09/20/2017   Procedure: A/V FISTULAGRAM - left arm;  Surgeon: Angelia Mould, MD;  Location: [facility];  Service: Cardiovascular;  Laterality: N/A;  . AV FISTULA PLACEMENT Left 02/16/2015   Procedure: LEFT ARM BRACHIOCEPHALIC (AV) FISTULA CREATION;  Surgeon: Mal Misty, MD;  Location: [facility];   Service: Vascular;  Laterality: Left;  . CHOLECYSTECTOMY    . INTRAOCULAR LENS INSERTION Right 10-21-13  . LIGATION OF COMPETING BRANCHES OF ARTERIOVENOUS FISTULA Left 02/16/2015   Procedure: LIGATION OF COMPETING BRANCHES OF LEFT BRACHIOCEPHALIC ARTERIOVENOUS FISTULA;  Surgeon: Mal Misty, MD;  Location: [facility];  Service: Vascular;  Laterality: Left;  . PARTIAL HIP ARTHROPLASTY    . PARTIAL KNEE ARTHROPLASTY     left  . PERIPHERAL VASCULAR BALLOON ANGIOPLASTY Left 09/20/2017   Procedure: PERIPHERAL VASCULAR BALLOON ANGIOPLASTY;  Surgeon: Angelia Mould, MD;  Location: [facility];  Service: Cardiovascular;  Laterality: Left;  left fistulagram  . TEMPOROMANDIBULAR JOINT SURGERY    . TONSILLECTOMY    . TUBAL LIGATION      Allergies  Allergen Reactions  . Oxycodone Other (See Comments)    [place] Record - Per pain clinic Dr, "pt to no take, has taken too long"  . Sulfa Antibiotics Itching    Prior to Admission medications   Medication Sig Start Date End Date Taking? Authorizing Provider  acetaminophen (TYLENOL) 500 MG tablet Take 1,000 mg by mouth every 6 (six) hours as needed for moderate pain or headache.   Yes [provider]  Alum Hydroxide-Mag Trisilicate (GAVISCON) 93-57.0 MG CHEW Chew 1 tablet by mouth daily as needed (heartburn).   Yes [provider]  cetirizine (ZYRTEC) 10 MG tablet Take 10 mg by mouth daily. 05/25/18  Yes [provider]  cloNIDine (CATAPRES) 0.1 MG tablet Take 0.1 mg by mouth 2 (two) times daily.    Yes [provider]  diclofenac sodium (VOLTAREN) 1 % GEL Apply 2 g topically 4 (four) times daily.   Yes [provider]  donepezil (ARICEPT) 10 MG tablet Take 1 tablet (10 mg total) by mouth at bedtime. 04/21/18  Yes Patel, Donika K, DO  Dulaglutide (TRULICITY) 1.5 ME/2.6ST SOPN Inject 1.5 mg into the skin every Sunday.   Yes [provider]  DULoxetine (CYMBALTA) 60 MG capsule Take 60 mg by  mouth daily.   Yes [provider]  furosemide (LASIX) 80 MG tablet Take 80 mg by mouth 2 (two) times daily.    Yes [provider]  gabapentin (NEURONTIN) 300 MG capsule Take 300 mg by mouth 2 (two) times daily.   Yes [provider]  glimepiride (AMARYL) 4 MG tablet Take 6 mg by mouth daily with breakfast.    Yes [provider]  levothyroxine (SYNTHROID, LEVOTHROID) 125 MCG tablet Take 125 mcg by mouth daily before breakfast.   Yes [provider]  lidocaine (LIDODERM) 5 % Place 1 patch onto the skin daily. Remove & Discard patch within 12 hours or as directed by MD Patient taking differently: Place 1 patch onto the skin daily as needed (pain). Remove & Discard patch within 12 hours or as directed by MD 04/16/17  Yes Kirsteins, Luanna Salk, MD  morphine (MSIR) 15 MG tablet Take 1 tablet (15 mg total) by mouth 3 (three) times daily. 05/29/18  Yes Bayard Hugger, NP  Multiple Vitamins-Minerals (CENTRUM SILVER PO) Take 1 tablet by mouth daily.   Yes [provider]  Omega-3 Fatty Acids (FISH OIL) 1000 MG CAPS Take 1,000 mg by mouth 2 (two) times daily.    Yes [provider]  OVER THE COUNTER MEDICATION Take 1 tablet by mouth daily. NeuRx TF   Yes [provider]  pioglitazone (ACTOS) 15 MG tablet Take 15 mg by mouth daily.   Yes [provider]  propranolol (INDERAL) 20 MG tablet Take 1 tablet (20 mg total) by mouth 3 (three) times daily. 06/11/18  Yes Patel, Donika K, DO  ranitidine (ZANTAC) 150 MG tablet Take 150 mg by mouth 2 (two) times daily.   Yes [provider]  rosuvastatin (CRESTOR) 10 MG tablet Take 10 mg by mouth daily.   Yes [provider]  triamcinolone ([employer] NASAL ALLERGY SPRAY) 55 MCG/ACT AERO nasal inhaler Place 2 sprays into the nose daily as needed (allergies).   Yes [provider]  vitamin B-12 (CYANOCOBALAMIN) 1000 MCG tablet Take 1,000 mcg by mouth daily.   Yes [provider]    Social History   Socioeconomic History  . Marital status: Married    Spouse name: Not on file  . Number of children: 3  . Years of education: 30  . Highest education level: Not on file  Occupational History  . Occupation: retired  Scientific laboratory technician  . Financial resource strain: Not on file  . Food insecurity:    Worry: Not on file    Inability: Not on file  . Transportation needs:    Medical: Not on file    Non-medical: Not on file  Tobacco Use  . Smoking status: Never Smoker  . Smokeless tobacco: Never Used  Substance and Sexual Activity  .  Alcohol use: No    Alcohol/week: 0.0 standard drinks  . Drug use: No  . Sexual activity: Yes  Lifestyle  . Physical activity:    Days per week: Not on file    Minutes per session: Not on file  . Stress: Not on file  Relationships  . Social connections:    Talks on phone: Not on file    Gets together: Not on file    Attends religious service: Not on file    Active member of club or organization: Not on file    Attends meetings of clubs or organizations: Not on file    Relationship status: Not on file  . Intimate partner violence:    Fear of current or ex partner: Not on file    Emotionally abused: Not on file    Physically abused: Not on file    Forced sexual activity: Not on file  Other Topics Concern  . Not on file  Social History Narrative   She lives with husband and daughter.  Three grown children.   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     She took early retirement 67 because of problems of RSD.   Highest level of education:  Graduated high school     Family History  Problem Relation Age of Onset  . Kidney disease Mother   . Heart disease Father   . Diabetes Brother   . Alcohol abuse Brother   . Depression Brother   . Sarcoidosis Brother   . ALS Brother        Deceased, 64s  . Heart disease Brother   . Bipolar disorder Daughter     ROS: [x]  Positive   [ ]  Negative    [ ]  All sytems reviewed and are negative:  Cardiac: []  chest pain/pressure []  palpitations []  SOB lying flat []  DOE  Vascular: []  pain in legs while walking []  pain in legs at rest []  pain in legs at night []  non-healing ulcers []  hx of DVT []  swelling in legs  Pulmonary: [x]  sleep apnea  Neurologic: []  weakness in []  arms []  legs []  numbness in []  arms []  legs []  hx of CVA []  mini stroke [] difficulty speaking or slurred speech []  temporary loss of vision in one eye []  dizziness  Hematologic: []  hx of cancer  Endocrine:   [x]  diabetes [x]  thyroid disease  GI [x]  GERD  GU: [x]  CKD/renal failure []  HD--[]  M/W/F or []  T/T/S  Psychiatric: []  anxiety []  depression  Musculoskeletal: [x]  arthritis  Integumentary: []  rashes []  ulcers  Constitutional: []  fever []  chills   Physical Examination  Vitals:   06/18/18 2316 06/19/18 0732  BP: 121/65 (!) 135/59  Pulse: 72 66  Resp: 17   Temp: 98.1 F (36.7 C)   SpO2: 95% 98%   Body mass index is 31.99 kg/m.  General:  WDWN in NAD Gait: Not observed HENT: WNL, normocephalic Pulmonary: normal non-labored breathing, without Rales, rhonchi,  wheezing Cardiac: regular, without  Murmurs, rubs or gallops; without carotid bruits Abdomen:  soft, NT/ND, no masses Skin: without rashes; ecchymosis left hand and left foot Vascular Exam/Pulses:  Right Left  Radial 2+ (normal) 2+ (normal)  Ulnar 2+ (normal) 2+ (normal)  DP 2+ (normal) 2+ (normal)   Extremities: without ischemic changes, without Gangrene , without cellulitis; without open wounds;  Musculoskeletal: no muscle wasting or atrophy  Neurologic: A&O X 3;  No focal weakness or paresthesias are detected; speech is fluent/normal Psychiatric:  The pt has flat affect.   CBC    Component Value Date/Time   WBC 12.1 (H) 06/19/2018 0350   RBC 2.93 (L) 06/19/2018 0350   HGB 8.8 (L) 06/19/2018 0350   HCT 28.6 (L) 06/19/2018 0350   PLT 234 06/19/2018 0350    MCV 97.6 06/19/2018 0350   MCH 30.0 06/19/2018 0350   MCHC 30.8 06/19/2018 0350   RDW 13.8 06/19/2018 0350   LYMPHSABS 1.4 06/19/2018 0350   MONOABS 0.9 06/19/2018 0350   EOSABS 0.3 06/19/2018 0350   BASOSABS 0.1 06/19/2018 0350    BMET    Component Value Date/Time   NA 140 06/19/2018 0350   K 3.1 (L) 06/19/2018 0350   CL 101 06/19/2018 0350   CO2 25 06/19/2018 0350   GLUCOSE 169 (H) 06/19/2018 0350   BUN 86 (H) 06/19/2018 0350   CREATININE 5.70 (H) 06/19/2018 0350   CALCIUM 8.6 (L) 06/19/2018 0350   GFRNONAA 7 (L) 06/19/2018 0350   GFRAA 8 (L) 06/19/2018 0350    COAGS: No results found for: INR, PROTIME   Non-Invasive Vascular Imaging:   none  Statin:  Yes.   Beta Blocker:  No. Aspirin:  Yes.   ACEI:  No. ARB:  No. CCB use:  No Other antiplatelets/anticoagulants:  Yes.   Sq heparin   ASSESSMENT/PLAN: This is a 77 y.o. female with AKI on CKD 4 now CKD 5 in need of permanent dialysis access.  She is right hand dominant.   -left BC AVF without thrill/bruit.  When Dr. Scot Dock saw pt earlier this year, he recommended ligation of fistula and placement of graft if pt did not want to proceed with fistulogram.    She has presented to hospital with AKI now in CKD 5 and now in need of graft as she is approaching the need for dialysis but not quite there yet.  Dr. Carlis Abbott will be by to see the pt later today.   Leontine Locket, PA-C [facility] [phone]  I have seen and evaluated the patient. I agree with the PA note as documented above. Thrombosed left brachiocephalic AVF placed in 8502 and never used - thrombosed likely since summer.  Probably needs LUE AV graft since right hand dominant.  Confirmed with Dr. Hollie Salk that does not need TDC at this time in setting of CKD stage V and has not started dialysis.  Will get updated vein mapping to ensure no decent basilic vein but anticipate left upper arm graft early next week with next available surgeon.   Fistulogram earlier this year with Dr. Scot Dock and central veins not evaluated.  Marty Heck, MD [facility] [place] Office: [phone] Pager: [phone]

## 2018-06-19 NOTE — Consult Note (Signed)
Neurology Consultation  Reason for Consult: Stroke Referring Physician: Dr. Posey Pronto  CC: MRI revealing strokes  History is obtained from: Chart review, patient's husband, patient  HPI: Paige Arnold is a 77 y.o. female who has a past medical history of stage IV kidney disease, hypertension, diabetes, morbid obesity, gastroesophageal reflux disease, chronic pain who was brought in to [facility] for evaluation of multiple falls and progressively worsening jerking movement concerning for seizures that have been ongoing for the past few weeks to couple months. She sees Dr. Narda Amber in the neurology clinic outpatient. According to her husband, she had been having progressive course shaking movements along with multiple falls and difficulty walking over the past few weeks to months and had become more confused prompting the trip to the hospital over at [place].  There she had severely elevated blood pressures which required IV nicardipine.  Her renal function also started to deteriorate and she was transferred to [facility] for higher level of care. She continued to remain encephalopathic and an MRI was obtained for work-up completion.  She could not complete the MRI due to claustrophobia.  DWI sequences showed possible subacute strokes bilaterally.  Neurological consultation was obtained for evaluation of the strokes and further recommendations. Reviewing Dr. Serita Grit last note from neurology, she had been complaining, at the end of November, about tremors, generalized shaking, headaches and whole body pain.  According to her daughter she had been falling almost every day without any warning.  Family felt that her increased shakes and jerks were the cause of her instability.  She denied any lightheadedness at the time.  She had some bruises on her hand at the time.   Patient's husband seemed to be somewhat of a poor historian.  She lives with her daughter, who is not available at this  time  LKW:4-8 weeks ago tpa given?: no, OSW Premorbid modified Rankin scale (mRS): 3  ROS: Unable to obtain due to altered mental status.   Past Medical History:  Diagnosis Date  . Arthritis   . Diabetes mellitus   . Diabetic neuropathy ([place])   . GERD (gastroesophageal reflux disease)   . Hypertension   . Kidney disease   . Pneumonia Oct. 2016  . Shortness of breath dyspnea    when I first lay down, then it gets better  . Sleep apnea    does not wear CPAP   . Thyroid disorder     Family History  Problem Relation Age of Onset  . Kidney disease Mother   . Heart disease Father   . Diabetes Brother   . Alcohol abuse Brother   . Depression Brother   . Sarcoidosis Brother   . ALS Brother        Deceased, 46s  . Heart disease Brother   . Bipolar disorder Daughter     Social History:   reports that she has never smoked. She has never used smokeless tobacco. She reports that she does not drink alcohol or use drugs.  Medications  Current Facility-Administered Medications:  .  0.9 %  sodium chloride infusion, , Intravenous, PRN, Cherene Altes, MD, Last Rate: 10 mL/hr at 06/16/18 2000 .  [DISCONTINUED] acetaminophen (TYLENOL) tablet 650 mg, 650 mg, Oral, Q4H PRN **OR** [DISCONTINUED] acetaminophen (TYLENOL) solution 650 mg, 650 mg, Per Tube, Q4H PRN **OR** acetaminophen (TYLENOL) suppository 325 mg, 325 mg, Rectal, Q4H PRN, Cherene Altes, MD, 325 mg at 06/18/18 0649 .  aspirin EC tablet 81 mg, 81 mg,  Oral, Daily, Lavina Hamman, MD, 81 mg at 06/19/18 2694 .  cloNIDine (CATAPRES) tablet 0.1 mg, 0.1 mg, Oral, BID, Madelon Lips, MD, 0.1 mg at 06/19/18 0905 .  donepezil (ARICEPT) tablet 10 mg, 10 mg, Oral, QHS, Garba, Mohammad L, MD, 10 mg at 06/18/18 2230 .  DULoxetine (CYMBALTA) DR capsule 60 mg, 60 mg, Oral, Daily, Caren Griffins, MD, 60 mg at 06/19/18 0904 .  furosemide (LASIX) tablet 80 mg, 80 mg, Oral, Daily, Madelon Lips, MD, 80 mg at 06/19/18 8546 .   haloperidol lactate (HALDOL) injection 2-5 mg, 2-5 mg, Intravenous, Q6H PRN, Cherene Altes, MD, 5 mg at 06/16/18 1359 .  heparin injection 5,000 Units, 5,000 Units, Subcutaneous, Q8H, Cherene Altes, MD, 5,000 Units at 06/19/18 [phone]  HYDROcodone-acetaminophen (NORCO/VICODIN) 5-325 MG per tablet 1 tablet, 1 tablet, Oral, Q8H PRN, Lavina Hamman, MD, 1 tablet at 06/19/18 1000 .  insulin aspart (novoLOG) injection 0-9 Units, 0-9 Units, Subcutaneous, Q4H, Cherene Altes, MD, 100 Units at 06/19/18 0920 .  isosorbide mononitrate (IMDUR) 24 hr tablet 30 mg, 30 mg, Oral, Daily, Madelon Lips, MD, 30 mg at 06/19/18 0905 .  levothyroxine (SYNTHROID, LEVOTHROID) tablet 125 mcg, 125 mcg, Oral, Q0600, Lavina Hamman, MD, 125 mcg at 06/19/18 [phone] .  MEDLINE mouth rinse, 15 mL, Mouth Rinse, BID, Joette Catching T, MD, 15 mL at 06/18/18 1007 .  naloxone [facility]) injection 0.2 mg, 0.2 mg, Intravenous, Q1H PRN, Caren Griffins, MD, 0.2 mg at 06/17/18 1407 .  rosuvastatin (CRESTOR) tablet 10 mg, 10 mg, Oral, Daily, Caren Griffins, MD, 10 mg at 06/19/18 0905 .  sodium chloride flush (NS) 0.9 % injection 3 mL, 3 mL, Intravenous, Q12H, Jonelle Sidle, Mohammad L, MD, 3 mL at 06/19/18 0906 .  sodium chloride flush (NS) 0.9 % injection 3 mL, 3 mL, Intravenous, PRN, Elwyn Reach, MD   Exam: Current vital signs: BP (!) 135/59 (BP Location: Left Arm)   Pulse 66   Temp 98.1 F (36.7 C) (Oral)   Resp 17   Ht 5\' 5"  (1.651 m)   Wt 87.2 kg   SpO2 98%   BMI 31.99 kg/m  Vital signs in last 24 hours: Temp:  [98.1 F (36.7 C)] 98.1 F (36.7 C) (12/04 2316) Pulse Rate:  [66-72] 66 (12/05 0732) Resp:  [17] 17 (12/04 2316) BP: (121-135)/(59-65) 135/59 (12/05 0732) SpO2:  [95 %-98 %] 98 % (12/05 0732) General: Patient awake, alert in no apparent distress. HEENT: Normocephalic atraumatic dry oral mucous murmurs Lungs: Clear to Clear to auscultation bilaterally with no wheezes CV - S1S2 RRR, no  m/r/g, equal pulses bilaterally. ABDOMEN - Soft, nontender, nondistended with normoactive BS Ext: warm, well perfused, intact peripheral pulses, 1+ edema NEURO:  Mental status: Patient is awake, alert, oriented x3.  She was able to tell me the month, the oncoming holiday, the president, where she is right now.  She could not however tell me her exact age. Her speech is not dysarthric, but is slow to respond all questions. Naming, comprehension and repetition are intact but attention and concentration is reduced. Cranial nerves: Pupils are equal round reactive light, extraocular movements are intact, visual fields are full to threat, face appears to be symmetric at rest and upon smiling, auditory acuity intact to conversational voice, palate elevates symmetrically, shoulder shrug intact, tongue midline. Motor exam: She has mild hemiparesis 4+/5 left upper and lower extremity, 5/5 right upper and lower extremity.  Some asterixis on outstretched arms.  Sensory exam: Intact light touch all over without extinction. Coordination: Intact finger-nose-finger DTRs: Mute NIHSS 1a Level of Conscious.: 0 1b LOC Questions: 1 1c LOC Commands: 0 2 Best Gaze: 0 3 Visual: 0 4 Facial Palsy: 0 5a Motor Arm - left: 1 5b Motor Arm - Right: 0 6a Motor Leg - Left: 0 6b Motor Leg - Right: 0 7 Limb Ataxia: 0 8 Sensory: 0 9 Best Language: 0 10 Dysarthria: 0 11 Extinct. and Inatten.: 0 TOTAL: 2  Labs I have reviewed labs in epic and the results pertinent to this consultation are: CBC-mild leukocytosis, no source identified.  Not on any antibiotics.     Component Value Date/Time   WBC 12.1 (H) 06/19/2018 0350   RBC 2.93 (L) 06/19/2018 0350   HGB 8.8 (L) 06/19/2018 0350   HCT 28.6 (L) 06/19/2018 0350   PLT 234 06/19/2018 0350   MCV 97.6 06/19/2018 0350   MCH 30.0 06/19/2018 0350   MCHC 30.8 06/19/2018 0350   RDW 13.8 06/19/2018 0350   LYMPHSABS 1.4 06/19/2018 0350   MONOABS 0.9 06/19/2018 0350    EOSABS 0.3 06/19/2018 0350   BASOSABS 0.1 06/19/2018 0350   CMP -hypokalemia, BUN 86, creatinine 5.7, GFR 7    Component Value Date/Time   NA 140 06/19/2018 0350   K 3.1 (L) 06/19/2018 0350   CL 101 06/19/2018 0350   CO2 25 06/19/2018 0350   GLUCOSE 169 (H) 06/19/2018 0350   BUN 86 (H) 06/19/2018 0350   CREATININE 5.70 (H) 06/19/2018 0350   CALCIUM 8.6 (L) 06/19/2018 0350   PROT 5.4 (L) 06/19/2018 0350   ALBUMIN 2.6 (L) 06/19/2018 0350   AST 25 06/19/2018 0350   ALT 27 06/19/2018 0350   ALKPHOS 57 06/19/2018 0350   BILITOT 0.9 06/19/2018 0350   GFRNONAA 7 (L) 06/19/2018 0350   GFRAA 8 (L) 06/19/2018 0350   Lipid Panel     Component Value Date/Time   CHOL 171 06/16/2018 0002   TRIG 173 (H) 06/16/2018 0002   HDL 51 06/16/2018 0002   CHOLHDL 3.4 06/16/2018 0002   VLDL 35 06/16/2018 0002   LDLCALC 85 06/16/2018 0002   B12 more than 7500 TSH Ammonia-16 on 06/16/2018  Imaging I have reviewed the images obtained: MRI examination of the brain-motion riddled scan that shows bilateral areas of restricted diffusion with differentials including subacute strokes and with a history of uncontrolled hypertension for many days, possible posterior reversible encephalopathy syndrome. Needs repeat imaging.  Assessment:  77 year old woman with past medical history as above with 4 to 8 weeks of frequent falls, generalized body shakes brought in for altered mental status and an outside hospital and transferred to [facility] for higher level of care when she was found to have worsening renal function. She has some asterixis on outstretched arms and left-sided hemiparesis along with reduced attention concentration which could happen in the setting of strokes affecting bilateral cerebral hemispheres as well as other conditions causing encephalopathy suggests posterior reversible encephalopathy syndrome-PR ES. Her imaging was incomplete due to her inability to tolerate the MRI as she is  claustrophobic.  Repeat imaging would be very helpful in determining cause. Low suspicion for seizure due to multiple metabolic derangements and negative myoclonus(asterixis) being of the known association with uremia and hyperammonemia.  Impression: Evaluate for stroke versus posterior reversible encephalopathy syndrome Toxic metabolic encephalopathy  Recommendations: Repeat MRI brain-might require some sedative.  Spoke with the hospitalist who is going to arrange for the MRI at the next  available opportunity. Check ammonia levels Check TSH Lipid panel has been checked-LDL is 85.  Goal LDL less than 70.  Continue statin. Plan relayed to the family member-husband at the bedside as well as hospitalist Dr. Posey Pronto.  -- Amie Portland, MD [facility] Pager: [phone] If 7pm to 7am, please call on call as listed on AMION.

## 2018-06-19 NOTE — Progress Notes (Signed)
PROGRESS NOTE  Paige Arnold ELF:[id number] DOB: [birth year] DOA: 06/15/2018 PCP: Angelina Sheriff, MD   LOS: 4 days   Brief Narrative / Interim history: 77 y.o.femalew/ a hx of CKD stage IV, HTN, diabetes, obesity, GERD, osteoarthritis and chronic pain who reported progressive mild jerky movements concerning for possible seizures over a few weeks, followed by a 30-minute stage of general confusion. She was seen by her Neurologist who recommended an MRI and adjusted her medications (renal dosing) but her symptoms have gotten worse in the interim. Upon presentation to the ED she was agitated and confused. She was found to have a systolic of 585. CT head was normal. Exam noted no evidence of focal neurologic deficit. Her creatinine was 4.6 with a baseline creatinine of 3.6 about a month prior. Troponin steadily rose frome 0.3 > 0.9.  Cardiology recommended medical management with heparin and nitroglycerin for NSTEMI. Given her worsening renal fxn she was transferred to [facility].   Subjective: More awake and more interactive.  Complain about having more pain.  No nausea no vomiting.  Assessment & Plan: Principal Problem:   Metabolic encephalopathy Active Problems:   Diabetes mellitus type 2 with complications, uncontrolled (HCC)   Chronic kidney disease (CKD), stage IV (severe) (HCC)   Hypertensive urgency   Hypothyroidism   Acute metabolic encephalopathy   NSTEMI (non-ST elevated myocardial infarction) (HCC)   Principal Problem Acute metabolic encephalopathy Likely combination of polypharmacy as well as hypertensive encephalopathy Possible pres versus subacute CVA on MRI Also combination with possible pres to severe high blood pressure  -EEG obtained which showed generalized nonspecific cerebral dysfunction without seizure activity, very nonspecific -Likely combination of polypharmacy as well as hypertensive encephalopathy. Check MRI brain Discontinue morphine. Transition to Norco  to avoid morphine metabolites. Gradually increase Norco back to equivalent of 45 mg of morphine. Outpatient follow-up with pain management clinic Patient neurology input as well.  We will repeat another MRI since the previous MRI was an incomplete study will use PRN Ativan for patient's claustrophobia.  Additional Problems Acute kidney injury on chronic kidney disease stage IV-V -Baseline creatinine in the mid threes, currently in the 5 range.  Nephrology consulted, discussed with Dr. Hollie Salk, appreciate input and defer further management to nephrology [facility] surgery consulted for graft placement.  Type 2 diabetes mellitus, poorly controlled, with renal complication -Most recent A1c 7.5. -Continue sliding scale, hold home medications  Hypertensive urgency -Initial blood pressure at 220-240, status post Cardene drip utilized at [employer] -Continue clonidine patch and IV hydralazine here, she is lethargic at times and p.o. intake is not consistent  Chronic pain -Has been on morphine 3 times daily for a long time.  Her morphine is likely toxic in the setting of worsening renal function given excellent response to Narcan Received Narcan twice 06/17/2018 Daughter is at bedside and extremely vocal regarding withdrawals and she would not want her mother to go through withdrawals.   Discontinue morphine. Transition to Norco and monitor.  Recommend to avoid morphine going forward.  Elevated TSH -Continue home regimen and repeat TSH as an outpatient.  Acute diastolic CHF -Since admission at [facility] patient diuresed over 3 L of fluids, no previous heart history.  2D echo here normal LVEF.  Currently she is euvolemic  Dementia -Continue Aricept, apparently at baseline she is highly functional   Scheduled Meds: . aspirin EC  81 mg Oral Daily  . cloNIDine  0.1 mg Oral BID  . donepezil  10 mg Oral QHS  .  DULoxetine  60 mg Oral Daily  . furosemide  80 mg Oral Daily  . heparin injection  (subcutaneous)  5,000 Units Subcutaneous Q8H  . insulin aspart  0-9 Units Subcutaneous Q4H  . isosorbide mononitrate  30 mg Oral Daily  . levothyroxine  125 mcg Oral Q0600  . mouth rinse  15 mL Mouth Rinse BID  . rosuvastatin  10 mg Oral Daily  . sodium chloride flush  3 mL Intravenous Q12H   Continuous Infusions: . sodium chloride 10 mL/hr at 06/16/18 2000   PRN Meds:.sodium chloride, [DISCONTINUED] acetaminophen **OR** [DISCONTINUED] acetaminophen (TYLENOL) oral liquid 160 mg/5 mL **OR** acetaminophen, haloperidol lactate, HYDROcodone-acetaminophen, LORazepam, naLOXone (NARCAN)  injection, sodium chloride flush  DVT prophylaxis: heparin Code Status: Full code Family Communication: Extensive discussion with daughter at bedside Disposition Plan: Home when ready  Consultants:   Nephrology  Procedures:   2D echo Impressions: - Study limited to parasternal views only. Patient would not cooperate with the rest of the study.  Antimicrobials:  None    Objective: Vitals:   06/18/18 2230 06/18/18 2316 06/19/18 0732 06/19/18 1821  BP: 121/65 121/65 (!) 135/59 (!) 131/50  Pulse:  72 66 61  Resp:  17    Temp:  98.1 F (36.7 C)  98.4 F (36.9 C)  TempSrc:  Oral    SpO2:  95% 98% 100%  Weight:      Height:        Intake/Output Summary (Last 24 hours) at 06/19/2018 1917 Last data filed at 06/19/2018 1446 Gross per 24 hour  Intake -  Output 1325 ml  Net -1325 ml   Filed Weights   06/15/18 1800  Weight: 87.2 kg    Examination:  Constitutional: Lethargic, alert after Narcan, interactive, Eyes: lids and conjunctivae normal ENMT: Mucous membranes are moist.  Neck: normal, supple Respiratory: clear to auscultation bilaterally, no wheezing, no crackles. Normal respiratory effort. No accessory muscle use.  Cardiovascular: Regular rate and rhythm, no murmurs / rubs / gallops. No LE edema. 2+ pedal pulses. No carotid bruits. Abdomen: no tenderness Musculoskeletal: no  clubbing / cyanosis.  Skin: no rashes Neurologic: Nonfocal   Data Reviewed: I have independently reviewed following labs and imaging studies   CBC: Recent Labs  Lab 06/15/18 1958 06/16/18 0002 06/17/18 0225 06/18/18 0811 06/19/18 0350  WBC 23.3* 21.3* 19.8* 15.0* 12.1*  NEUTROABS  --   --   --   --  9.4*  HGB 12.4 12.3 11.0* 9.5* 8.8*  HCT 40.9 39.4 34.8* 31.1* 28.6*  MCV 96.5 95.6 96.7 99.7 97.6  PLT 396 383 302 239 998   Basic Metabolic Panel: Recent Labs  Lab 06/15/18 1958 06/16/18 1020 06/17/18 0225 06/18/18 0811 06/19/18 0350  NA 142 145 142 142 140  K 3.7 3.6 3.2* 3.5 3.1*  CL 100 103 101 101 101  CO2 25 28 26 28 25   GLUCOSE 198* 241* 158* 204* 169*  BUN 66* 77* 83* 85* 86*  CREATININE 4.59* 5.75* 5.58* 5.87* 5.70*  CALCIUM 9.4 9.5 9.2 8.9 8.6*  MG  --   --   --   --  2.0  PHOS  --   --   --  5.5*  --    GFR: Estimated Creatinine Clearance: 9.2 mL/min (A) (by C-G formula based on SCr of 5.7 mg/dL (H)). Liver Function Tests: Recent Labs  Lab 06/15/18 1958 06/16/18 1020 06/17/18 0225 06/18/18 0811 06/19/18 0350  AST 31 37 35  --  25  ALT 23 24  26  --  27  ALKPHOS 76 78 68  --  57  BILITOT 1.1 0.8 1.0  --  0.9  PROT 7.3 6.2* 5.9*  --  5.4*  ALBUMIN 3.3* 3.3* 2.9* 2.7* 2.6*   No results for input(s): LIPASE, AMYLASE in the last 168 hours. Recent Labs  Lab 06/16/18 1020  AMMONIA 16   Coagulation Profile: No results for input(s): INR, PROTIME in the last 168 hours. Cardiac Enzymes: Recent Labs  Lab 06/15/18 1958 06/16/18 0851 06/17/18 0608 06/17/18 1151  CKTOTAL  --  537*  --  503*  TROPONINI 0.33*  --  0.13*  --    BNP (last 3 results) No results for input(s): PROBNP in the last 8760 hours. HbA1C: No results for input(s): HGBA1C in the last 72 hours. CBG: Recent Labs  Lab 06/19/18 0401 06/19/18 0730 06/19/18 0913 06/19/18 1210 06/19/18 1819  GLUCAP 148* 138* 172* 175* 128*   Lipid Profile: No results for input(s): CHOL, HDL,  LDLCALC, TRIG, CHOLHDL, LDLDIRECT in the last 72 hours. Thyroid Function Tests: No results for input(s): TSH, T4TOTAL, FREET4, T3FREE, THYROIDAB in the last 72 hours. Anemia Panel: No results for input(s): VITAMINB12, FOLATE, FERRITIN, TIBC, IRON, RETICCTPCT in the last 72 hours. Urine analysis:    Component Value Date/Time   COLORURINE YELLOW 06/16/2018 [place] 06/16/2018 1035   LABSPEC 1.013 06/16/2018 1035   PHURINE 6.0 06/16/2018 1035   GLUCOSEU 50 (A) 06/16/2018 1035   HGBUR MODERATE (A) 06/16/2018 1035   BILIRUBINUR NEGATIVE 06/16/2018 1035   KETONESUR NEGATIVE 06/16/2018 1035   PROTEINUR >=300 (A) 06/16/2018 1035   UROBILINOGEN 0.2 03/09/2014 1331   NITRITE NEGATIVE 06/16/2018 1035   LEUKOCYTESUR NEGATIVE 06/16/2018 1035   Sepsis Labs: Invalid input(s): PROCALCITONIN, LACTICIDVEN  Recent Results (from the past 240 hour(s))  MRSA PCR Screening     Status: None   Collection Time: 06/15/18  8:00 PM  Result Value Ref Range Status   MRSA by PCR NEGATIVE NEGATIVE Final    Comment:        The GeneXpert MRSA Assay (FDA approved for NASAL specimens only), is one component of a comprehensive MRSA colonization surveillance program. It is not intended to diagnose MRSA infection nor to guide or monitor treatment for MRSA infections. Performed at [facility], [place] [address] [place], [place] [address]       Radiology Studies: Mr Brain 38 Contrast  Result Date: 06/18/2018 CLINICAL DATA:  77 y/o F; progressive mild jerking movements with concern for possible seizures. Altered mental status. Metabolic encephalopathy. EXAM: MRI HEAD WITHOUT CONTRAST TECHNIQUE: Axial DWI and axial T2 FLAIR sequences were acquired. The patient declined to continue the examination and additional sequences were not acquired. COMPARISON:  06/14/2018 CT head.  04/28/2013 MRI head. FINDINGS: Approximately 1 cm foci of reduced diffusion are present within the posterosuperior  frontal lobes bilaterally (series 5 image 88 and 91). Additional possible punctate focus of reduced diffusion within the right anterolateral frontal lobe (series 5, image 84). Foci of reduced diffusion demonstrate associated increased T2 FLAIR hyperintense signal abnormality and are new from the prior MRI of the brain. Several nonspecific T2 FLAIR hyperintensities in subcortical and periventricular white matter are compatible with mild chronic microvascular ischemic changes for age. Mild volume loss of the brain. No mass effect, appreciable extra-axial collection, hydrocephalus, or herniation. No abnormal diffusion or T2 FLAIR signal abnormality of calvarium, orbits, sinuses, or mastoid air cells. IMPRESSION: 1. Exam limited to DWI and T2  FLAIR sequences. 2. Approximately 1 cm foci of reduced diffusion in the posterosuperior frontal lobes and possible additional punctate focus in the right anterior frontal lobe. Findings probably represent acute/early subacute infarctions. Findings are atypical for seizure related activity. 3. Mild chronic microvascular ischemic changes and volume loss of the brain. These results will be called to the ordering clinician or representative by the Radiologist Assistant, and communication documented in the PACS or zVision Dashboard. Electronically Signed   By: Kristine Garbe M.D.   On: 06/18/2018 17:09    Time spent: 40 minutes,   Author:  Berle Mull, MD [facility] Pager: [phone] 06/19/2018 7:17 PM     If 7PM-7AM, please contact night-coverage [email or web address] Password [email or web address] 06/19/2018, 7:17 PM

## 2018-06-19 NOTE — Progress Notes (Signed)
[facility] Progress Note    Assessment/ Plan:   1. Acute on chronic kidney disease stage V: Cr up to 5 initially on [place], initially improved to 4.6 with treatment of blood pressure and acute diastolic CHF and now up to 5.5.  [place] with some blood and RBCs and WBCs- will order culture.  CK mildly elevated at 537- recheck better at ~500.  Ordered renal US- no obstruction.  I don't think she's uremic as she's had a marked improvement in mentation with control of BP and d/c of narcs.  Will c/s VVS for AVG placement while here but don't necessarily think she needs to start yet.  2.  Acute encephalopathy/ ? seizures: CT head negative, MRI showing some acute/ subacute posterosuperior frontal lobe infarcts + R anterior frontal lobe.  Getting HIV and RPR (neg), off gabapentin on MAR.  Ammonia 16, Tylenol and salicylate levels negative--> but pt was admitted 11/30, LFTs normal.  Minimal narcs on MAR.  Possible combo of hypertensive encephalopathy and polypharmacy (see below).  Routine EEG without seizure activity.  Carotid US without obvious stenosis.  3.  Hypertensive urgency: on nitropaste, has transitioned to clonidine patch and hydralazine prn--> of note, inderal was started 11/27 but don't favor that medication for control of BP.  OP meds list only clonidine and Lasix.  Swtiched from nitropaste to Imdur and from clonidine patch to PO with reasonable control of pressures.  4.  Acute diastolic CHF exacerbation: TTE at [facility] with concentric LVH, TTE here suboptimal study but EF 65-70% without WMA .  Lasix as above.  5.  NSTEMI: s/p hep gtt (? Demand ischemia), per primary.  6.  Polypharmacy: off gabapentin, getting too much morhpine- has been reduced  7.  Dispo: pending  Subjective:    Marked improvement in mentation today off narcs.  MRI yesterday showing acute/ subacute infarcts that would be atypical for seizure activity.  Cr 5.6.  Good UOP.   Objective:   BP (!) 135/59  (BP Location: Left Arm)   Pulse 66   Temp 98.1 F (36.7 C) (Oral)   Resp 17   Ht 5\' 5"  (1.651 m)   Wt 87.2 kg   SpO2 98%   BMI 31.99 kg/m   Intake/Output Summary (Last 24 hours) at 06/19/2018 6073 Last data filed at 06/19/2018 7106 Gross per 24 hour  Intake -  Output 1000 ml  Net -1000 ml   Weight change:   Physical Exam: Gen: chronically ill-appearing, awake and smiling today CVS: RRR today Resp: clear bilaterally Abd: soft, nontender Ext: no LE edema Neuro: smiling and alert, no asterixis ACCESS: LUE AVF nonpulsatile  Imaging: Mr Brain Wo Contrast  Result Date: 06/18/2018 CLINICAL DATA:  77 y/o F; progressive mild jerking movements with concern for possible seizures. Altered mental status. Metabolic encephalopathy. EXAM: MRI HEAD WITHOUT CONTRAST TECHNIQUE: Axial DWI and axial T2 FLAIR sequences were acquired. The patient declined to continue the examination and additional sequences were not acquired. COMPARISON:  06/14/2018 CT head.  04/28/2013 MRI head. FINDINGS: Approximately 1 cm foci of reduced diffusion are present within the posterosuperior frontal lobes bilaterally (series 5 image 88 and 91). Additional possible punctate focus of reduced diffusion within the right anterolateral frontal lobe (series 5, image 84). Foci of reduced diffusion demonstrate associated increased T2 FLAIR hyperintense signal abnormality and are new from the prior MRI of the brain. Several nonspecific T2 FLAIR hyperintensities in subcortical and periventricular white matter are compatible with mild chronic microvascular ischemic changes for  age. Mild volume loss of the brain. No mass effect, appreciable extra-axial collection, hydrocephalus, or herniation. No abnormal diffusion or T2 FLAIR signal abnormality of calvarium, orbits, sinuses, or mastoid air cells. IMPRESSION: 1. Exam limited to DWI and T2 FLAIR sequences. 2. Approximately 1 cm foci of reduced diffusion in the posterosuperior frontal lobes  and possible additional punctate focus in the right anterior frontal lobe. Findings probably represent acute/early subacute infarctions. Findings are atypical for seizure related activity. 3. Mild chronic microvascular ischemic changes and volume loss of the brain. These results will be called to the ordering clinician or representative by the Radiologist Assistant, and communication documented in the PACS or zVision Dashboard. Electronically Signed   By: Kristine Garbe M.D.   On: 06/18/2018 17:09   US Renal  Result Date: 06/17/2018 CLINICAL DATA:  Chronic kidney disease EXAM: RENAL / URINARY TRACT ULTRASOUND COMPLETE COMPARISON:  CT abdomen pelvis 04/28/2018 FINDINGS: Right Kidney: Renal measurements: 12.1 x 4.4 x 5.2 cm = volume: 144 mL. Right kidney difficult to visualize due to increased echogenicity of the cortex and body habitus. Negative for mass or hydronephrosis. Left Kidney: Renal measurements: 8.5 x 4.5 x 3.8 cm = volume: 75 mL. Increased echogenicity of the left renal cortex. Echogenic suprarenal mass on the left measures 3 x 3.5 cm. This is similar to the prior CT which showed a fatty lesion immediately above the left kidney, separate from the adrenal gland. Negative for hydronephrosis. Bladder: Foley catheter IMPRESSION: 1. Negative for hydronephrosis 2. Small left kidney. Increased echogenicity of the renal cortex bilaterally 3. 3 x 3.5 cm suprarenal mass on the left unchanged from prior CT. This also is unchanged from CT of 07/26/2014. 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 Electronically Signed   By: Franchot Gallo M.D.   On: 06/17/2018 11:20    Labs: BMET Recent Labs  Lab 06/15/18 1958 06/16/18 1020 06/17/18 0225 06/18/18 0811 06/19/18 0350  NA 142 145 142 142 140  K 3.7 3.6 3.2* 3.5 3.1*  CL 100 103 101 101 101  CO2 25 28 26 28 25   GLUCOSE 198* 241* 158* 204* 169*  BUN 66* 77* 83* 85* 86*  CREATININE 4.59* 5.75* 5.58* 5.87* 5.70*  CALCIUM 9.4 9.5  9.2 8.9 8.6*  PHOS  --   --   --  5.5*  --    CBC Recent Labs  Lab 06/16/18 0002 06/17/18 0225 06/18/18 0811 06/19/18 0350  WBC 21.3* 19.8* 15.0* 12.1*  NEUTROABS  --   --   --  9.4*  HGB 12.3 11.0* 9.5* 8.8*  HCT 39.4 34.8* 31.1* 28.6*  MCV 95.6 96.7 99.7 97.6  PLT 383 302 239 234    Medications:    . aspirin EC  81 mg Oral Daily  . cloNIDine  0.1 mg Oral BID  . donepezil  10 mg Oral QHS  . DULoxetine  60 mg Oral Daily  . furosemide  80 mg Oral Daily  . heparin injection (subcutaneous)  5,000 Units Subcutaneous Q8H  . insulin aspart  0-9 Units Subcutaneous Q4H  . isosorbide mononitrate  30 mg Oral Daily  . levothyroxine  125 mcg Oral Q0600  . mouth rinse  15 mL Mouth Rinse BID  . rosuvastatin  10 mg Oral Daily  . sodium chloride flush  3 mL Intravenous Q12H      Madelon Lips, MD 06/19/2018, 9:24 AM

## 2018-06-19 NOTE — Progress Notes (Signed)
Occupational Therapy Treatment Patient Details Name: Paige Arnold MRN: [id number] DOB: 1941-02-06 Today's Date: 06/19/2018    History of present illness 77 y.o. female with medical history significant of chronic kidney disease stage IV, hypertension, diabetes, morbid obesity, GERD, osteoarthritis and chronic pain who has had progressive mild jerky movements concerning for possible seizures over the last few weeks in addition to AMS. Patient presents now with metabolic encephalopathy, work up underway.   OT comments  Pt progressing towards acute OT goals. Focus of session was increasing activity tolerance during ADLs. Pt reporting dizziness once OOB which stayed the same throughout session per her report. Pt needing 2x seated rest breaks during functional mobility. BP assessed once back in the room 140/69. Pt reports she feels worse today than yesterday. Pt's family reports she has not "eaten anything other than some chocolate pudding and ice cream since Friday." Nursing notified. May benefit from Dietary consult. D/c plan remains appropriate.    Follow Up Recommendations  SNF;Supervision/Assistance - 24 hour;Other (comment)    Equipment Recommendations  Other (comment)    Recommendations for Other Services      Precautions / Restrictions Precautions Precautions: Fall Restrictions Weight Bearing Restrictions: No       Mobility Bed Mobility Overal bed mobility: Needs Assistance Bed Mobility: Supine to Sit;Sit to Supine     Supine to sit: Min assist;HOB elevated Sit to supine: Min assist;HOB elevated      Transfers Overall transfer level: Needs assistance Equipment used: Rolling walker (2 wheeled) Transfers: Sit to/from Stand Sit to Stand: Min assist         General transfer comment: steadying assist. pt fearful of falling    Balance Overall balance assessment: Needs assistance Sitting-balance support: No upper extremity supported Sitting balance-Leahy Scale: Fair     Standing balance support: Bilateral upper extremity supported Standing balance-Leahy Scale: Poor                             ADL either performed or assessed with clinical judgement   ADL Overall ADL's : Needs assistance/impaired                                     Functional mobility during ADLs: Minimal assistance;Rolling walker General ADL Comments: Pt completed bed mobility and walked into the halls a bit with 2x seated rest break.      Vision       Perception     Praxis      Cognition Arousal/Alertness: Awake/alert Behavior During Therapy: Flat affect Overall Cognitive Status: Impaired/Different from baseline Area of Impairment: Attention;Problem solving;Safety/judgement                   Current Attention Level: Selective         Problem Solving: Slow processing          Exercises     Shoulder Instructions       General Comments      Pertinent Vitals/ Pain       Pain Assessment: Faces Faces Pain Scale: Hurts even more Pain Location: back Pain Descriptors / Indicators: Aching Pain Intervention(s): Limited activity within patient's tolerance;Monitored during session;Repositioned;Heat applied  Home Living  Prior Functioning/Environment              Frequency  Min 2X/week        Progress Toward Goals  OT Goals(current goals can now be found in the care plan section)  Progress towards OT goals: Progressing toward goals  Acute Rehab OT Goals Patient Stated Goal: none stated OT Goal Formulation: Patient unable to participate in goal setting Time For Goal Achievement: 06/30/18 Potential to Achieve Goals: Fair ADL Goals Pt Will Perform Grooming: with set-up;with supervision;sitting Pt Will Perform Upper Body Bathing: with set-up;with supervision Pt Will Perform Lower Body Bathing: with min assist;sit to/from stand Pt Will Perform Lower Body  Dressing: with min assist;sit to/from stand Pt Will Transfer to Toilet: with min guard assist;ambulating;bedside commode Pt Will Perform Toileting - Clothing Manipulation and hygiene: with min guard assist;sit to/from stand Additional ADL Goal #1: pt will be S in and OOB for basic ADLs Additional ADL Goal #2: Pt will follow one step commands 75% of time  Plan Discharge plan remains appropriate    Co-evaluation                 AM-PAC OT "6 Clicks" Daily Activity     Outcome Measure   Help from another person eating meals?: A Lot Help from another person taking care of personal grooming?: A Lot Help from another person toileting, which includes using toliet, bedpan, or urinal?: A Lot Help from another person bathing (including washing, rinsing, drying)?: A Lot Help from another person to put on and taking off regular upper body clothing?: A Little Help from another person to put on and taking off regular lower body clothing?: A Lot 6 Click Score: 13    End of Session Equipment Utilized During Treatment: Gait belt;Rolling walker  OT Visit Diagnosis: Unsteadiness on feet (R26.81);Other abnormalities of gait and mobility (R26.89);Other symptoms and signs involving cognitive function;Cognitive communication deficit (R41.841)   Activity Tolerance Other (comment)(+ dizziness)   Patient Left in bed;with bed alarm set;with family/visitor present   Nurse Communication Other (comment)(family reports decreased food intake since Friday)        Time: 1335-1400 OT Time Calculation (min): 25 min  Charges: OT General Charges $OT Visit: 1 Visit OT Treatments $Self Care/Home Management : 23-37 mins  Tyrone Schimke, OT Acute Rehabilitation Services Pager: [phone] Office: [phone]    Hortencia Pilar 06/19/2018, 2:19 PM

## 2018-06-19 NOTE — Consult Note (Addendum)
Hospital Consult    Reason for Consult:  In need of dialysis access Requesting Physician:  Hollie Salk  MRN #:  [id number]  History of Present Illness: This is a 77 y.o. female who previously underwent a left brachiocephalic AVF on 11/19/19 for CKD 4.   She subsequently underwent a fistulogram with venoplasty on 09/20/17.   She was seen back in the office by Dr. Scot Dock in April 2019 and was noted to have some pulsatility in her fistula and the duplex suggested a stenosis in the proximal fistula.  He recommended proceeding with fistulogram to to address the proximal stenosis, however, the pt and her husband had concerns over cost of the procedure.  He also explained the alternative would be to ligate the fistula and place a graft.    She was admitted to the hospital on 06/15/18 and was found to be agitated and confused.  She was hypertensive with systolic of 308.  She did have a NSTEMI  She also had elevated BNP.  Her renal function had also worsened.  Vascular surgery is consulted for graft placement while here, but renal doesn't necessarily thinks she needs to start dialysis quite yet.    The pt is on a statin for cholesterol management.  She is on medication for diabetes.    Past Medical History:  Diagnosis Date  . Arthritis   . Diabetes mellitus   . Diabetic neuropathy ([place])   . GERD (gastroesophageal reflux disease)   . Hypertension   . Kidney disease   . Pneumonia Oct. 2016  . Shortness of breath dyspnea    when I first lay down, then it gets better  . Sleep apnea    does not wear CPAP   . Thyroid disorder     Past Surgical History:  Procedure Laterality Date  . A/V FISTULAGRAM N/A 09/20/2017   Procedure: A/V FISTULAGRAM - left arm;  Surgeon: Angelia Mould, MD;  Location: [facility];  Service: Cardiovascular;  Laterality: N/A;  . AV FISTULA PLACEMENT Left 02/16/2015   Procedure: LEFT ARM BRACHIOCEPHALIC (AV) FISTULA CREATION;  Surgeon: Mal Misty, MD;  Location: [facility];   Service: Vascular;  Laterality: Left;  . CHOLECYSTECTOMY    . INTRAOCULAR LENS INSERTION Right 10-21-13  . LIGATION OF COMPETING BRANCHES OF ARTERIOVENOUS FISTULA Left 02/16/2015   Procedure: LIGATION OF COMPETING BRANCHES OF LEFT BRACHIOCEPHALIC ARTERIOVENOUS FISTULA;  Surgeon: Mal Misty, MD;  Location: [facility];  Service: Vascular;  Laterality: Left;  . PARTIAL HIP ARTHROPLASTY    . PARTIAL KNEE ARTHROPLASTY     left  . PERIPHERAL VASCULAR BALLOON ANGIOPLASTY Left 09/20/2017   Procedure: PERIPHERAL VASCULAR BALLOON ANGIOPLASTY;  Surgeon: Angelia Mould, MD;  Location: [facility];  Service: Cardiovascular;  Laterality: Left;  left fistulagram  . TEMPOROMANDIBULAR JOINT SURGERY    . TONSILLECTOMY    . TUBAL LIGATION      Allergies  Allergen Reactions  . Oxycodone Other (See Comments)    [place] Record - Per pain clinic Dr, "pt to no take, has taken too long"  . Sulfa Antibiotics Itching    Prior to Admission medications   Medication Sig Start Date End Date Taking? Authorizing Provider  acetaminophen (TYLENOL) 500 MG tablet Take 1,000 mg by mouth every 6 (six) hours as needed for moderate pain or headache.   Yes [provider]  Alum Hydroxide-Mag Trisilicate (GAVISCON) 65-78.4 MG CHEW Chew 1 tablet by mouth daily as needed (heartburn).   Yes [provider]  cetirizine (ZYRTEC) 10 MG tablet Take 10 mg by mouth daily. 05/25/18  Yes [provider]  cloNIDine (CATAPRES) 0.1 MG tablet Take 0.1 mg by mouth 2 (two) times daily.    Yes [provider]  diclofenac sodium (VOLTAREN) 1 % GEL Apply 2 g topically 4 (four) times daily.   Yes [provider]  donepezil (ARICEPT) 10 MG tablet Take 1 tablet (10 mg total) by mouth at bedtime. 04/21/18  Yes Patel, Donika K, DO  Dulaglutide (TRULICITY) 1.5 TK/3.5WS SOPN Inject 1.5 mg into the skin every Sunday.   Yes [provider]  DULoxetine (CYMBALTA) 60 MG capsule Take 60 mg by  mouth daily.   Yes [provider]  furosemide (LASIX) 80 MG tablet Take 80 mg by mouth 2 (two) times daily.    Yes [provider]  gabapentin (NEURONTIN) 300 MG capsule Take 300 mg by mouth 2 (two) times daily.   Yes [provider]  glimepiride (AMARYL) 4 MG tablet Take 6 mg by mouth daily with breakfast.    Yes [provider]  levothyroxine (SYNTHROID, LEVOTHROID) 125 MCG tablet Take 125 mcg by mouth daily before breakfast.   Yes [provider]  lidocaine (LIDODERM) 5 % Place 1 patch onto the skin daily. Remove & Discard patch within 12 hours or as directed by MD Patient taking differently: Place 1 patch onto the skin daily as needed (pain). Remove & Discard patch within 12 hours or as directed by MD 04/16/17  Yes Kirsteins, Luanna Salk, MD  morphine (MSIR) 15 MG tablet Take 1 tablet (15 mg total) by mouth 3 (three) times daily. 05/29/18  Yes [facility] Hugger, NP  Multiple Vitamins-Minerals (CENTRUM SILVER PO) Take 1 tablet by mouth daily.   Yes [provider]  Omega-3 Fatty Acids (FISH OIL) 1000 MG CAPS Take 1,000 mg by mouth 2 (two) times daily.    Yes [provider]  OVER THE COUNTER MEDICATION Take 1 tablet by mouth daily. NeuRx TF   Yes [provider]  pioglitazone (ACTOS) 15 MG tablet Take 15 mg by mouth daily.   Yes [provider]  propranolol (INDERAL) 20 MG tablet Take 1 tablet (20 mg total) by mouth 3 (three) times daily. 06/11/18  Yes Patel, Donika K, DO  ranitidine (ZANTAC) 150 MG tablet Take 150 mg by mouth 2 (two) times daily.   Yes [provider]  rosuvastatin (CRESTOR) 10 MG tablet Take 10 mg by mouth daily.   Yes [provider]  triamcinolone ([employer] NASAL ALLERGY SPRAY) 55 MCG/ACT AERO nasal inhaler Place 2 sprays into the nose daily as needed (allergies).   Yes [provider]  vitamin B-12 (CYANOCOBALAMIN) 1000 MCG tablet Take 1,000 mcg by mouth daily.   Yes [provider]    Social History   Socioeconomic History  . Marital status: Married    Spouse name: Not on file  . Number of children: 3  . Years of education: 108  . Highest education level: Not on file  Occupational History  . Occupation: retired  Scientific laboratory technician  . Financial resource strain: Not on file  . Food insecurity:    Worry: Not on file    Inability: Not on file  . Transportation needs:    Medical: Not on file    Non-medical: Not on file  Tobacco Use  . Smoking status: Never Smoker  . Smokeless tobacco: Never Used  Substance and Sexual Activity  .  Alcohol use: No    Alcohol/week: 0.0 standard drinks  . Drug use: No  . Sexual activity: Yes  Lifestyle  . Physical activity:    Days per week: Not on file    Minutes per session: Not on file  . Stress: Not on file  Relationships  . Social connections:    Talks on phone: Not on file    Gets together: Not on file    Attends religious service: Not on file    Active member of club or organization: Not on file    Attends meetings of clubs or organizations: Not on file    Relationship status: Not on file  . Intimate partner violence:    Fear of current or ex partner: Not on file    Emotionally abused: Not on file    Physically abused: Not on file    Forced sexual activity: Not on file  Other Topics Concern  . Not on file  Social History Narrative   She lives with husband and daughter.  Three grown children.   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     She took early retirement 36 because of problems of RSD.   Highest level of education:  Graduated high school     Family History  Problem Relation Age of Onset  . Kidney disease Mother   . Heart disease Father   . Diabetes Brother   . Alcohol abuse Brother   . Depression Brother   . Sarcoidosis Brother   . ALS Brother        Deceased, 42s  . Heart disease Brother   . Bipolar disorder Daughter     ROS: [x]  Positive   [ ]  Negative    [ ]  All sytems reviewed and are negative:  Cardiac: []  chest pain/pressure []  palpitations []  SOB lying flat []  DOE  Vascular: []  pain in legs while walking []  pain in legs at rest []  pain in legs at night []  non-healing ulcers []  hx of DVT []  swelling in legs  Pulmonary: [x]  sleep apnea  Neurologic: []  weakness in []  arms []  legs []  numbness in []  arms []  legs []  hx of CVA []  mini stroke [] difficulty speaking or slurred speech []  temporary loss of vision in one eye []  dizziness  Hematologic: []  hx of cancer  Endocrine:   [x]  diabetes [x]  thyroid disease  GI [x]  GERD  GU: [x]  CKD/renal failure []  HD--[]  M/W/F or []  T/T/S  Psychiatric: []  anxiety []  depression  Musculoskeletal: [x]  arthritis  Integumentary: []  rashes []  ulcers  Constitutional: []  fever []  chills   Physical Examination  Vitals:   06/18/18 2316 06/19/18 0732  BP: 121/65 (!) 135/59  Pulse: 72 66  Resp: 17   Temp: 98.1 F (36.7 C)   SpO2: 95% 98%   Body mass index is 31.99 kg/m.  General:  WDWN in NAD Gait: Not observed HENT: WNL, normocephalic Pulmonary: normal non-labored breathing, without Rales, rhonchi,  wheezing Cardiac: regular, without  Murmurs, rubs or gallops; without carotid bruits Abdomen:  soft, NT/ND, no masses Skin: without rashes; ecchymosis left hand and left foot Vascular Exam/Pulses:  Right Left  Radial 2+ (normal) 2+ (normal)  Ulnar 2+ (normal) 2+ (normal)  DP 2+ (normal) 2+ (normal)   Extremities: without ischemic changes, without Gangrene , without cellulitis; without open wounds;  Musculoskeletal: no muscle wasting or atrophy  Neurologic: A&O X 3;  No focal weakness or paresthesias are detected; speech is fluent/normal Psychiatric:  The pt has flat affect.   CBC    Component Value Date/Time   WBC 12.1 (H) 06/19/2018 0350   RBC 2.93 (L) 06/19/2018 0350   HGB 8.8 (L) 06/19/2018 0350   HCT 28.6 (L) 06/19/2018 0350   PLT 234 06/19/2018 0350    MCV 97.6 06/19/2018 0350   MCH 30.0 06/19/2018 0350   MCHC 30.8 06/19/2018 0350   RDW 13.8 06/19/2018 0350   LYMPHSABS 1.4 06/19/2018 0350   MONOABS 0.9 06/19/2018 0350   EOSABS 0.3 06/19/2018 0350   BASOSABS 0.1 06/19/2018 0350    BMET    Component Value Date/Time   NA 140 06/19/2018 0350   K 3.1 (L) 06/19/2018 0350   CL 101 06/19/2018 0350   CO2 25 06/19/2018 0350   GLUCOSE 169 (H) 06/19/2018 0350   BUN 86 (H) 06/19/2018 0350   CREATININE 5.70 (H) 06/19/2018 0350   CALCIUM 8.6 (L) 06/19/2018 0350   GFRNONAA 7 (L) 06/19/2018 0350   GFRAA 8 (L) 06/19/2018 0350    COAGS: No results found for: INR, PROTIME   Non-Invasive Vascular Imaging:   none  Statin:  Yes.   Beta Blocker:  No. Aspirin:  Yes.   ACEI:  No. ARB:  No. CCB use:  No Other antiplatelets/anticoagulants:  Yes.   Sq heparin   ASSESSMENT/PLAN: This is a 77 y.o. female with AKI on CKD 4 now CKD 5 in need of permanent dialysis access.  She is right hand dominant.   -left BC AVF without thrill/bruit.  When Dr. Scot Dock saw pt earlier this year, he recommended ligation of fistula and placement of graft if pt did not want to proceed with fistulogram.    She has presented to hospital with AKI now in CKD 5 and now in need of graft as she is approaching the need for dialysis but not quite there yet.  Dr. Carlis Abbott will be by to see the pt later today.   Leontine Locket, PA-C [facility] [phone]  I have seen and evaluated the patient. I agree with the PA note as documented above. Thrombosed left brachiocephalic AVF placed in 7209 and never used - thrombosed likely since summer.  Probably needs LUE AV graft since right hand dominant.  Confirmed with Dr. Hollie Salk that does not need TDC at this time in setting of CKD stage V and has not started dialysis.  Will get updated vein mapping to ensure no decent basilic vein but anticipate left upper arm graft early next week with next available surgeon.   Fistulogram earlier this year with Dr. Scot Dock and central veins not evaluated.  Marty Heck, MD [facility] Office: [phone] Pager: [phone]

## 2018-06-20 ENCOUNTER — Inpatient Hospital Stay (HOSPITAL_COMMUNITY): Payer: Medicare Other

## 2018-06-20 ENCOUNTER — Encounter (HOSPITAL_COMMUNITY): Payer: Self-pay

## 2018-06-20 DIAGNOSIS — F419 Anxiety disorder, unspecified: Secondary | ICD-10-CM

## 2018-06-20 DIAGNOSIS — E118 Type 2 diabetes mellitus with unspecified complications: Secondary | ICD-10-CM

## 2018-06-20 DIAGNOSIS — Z01810 Encounter for preprocedural cardiovascular examination: Secondary | ICD-10-CM

## 2018-06-20 DIAGNOSIS — N185 Chronic kidney disease, stage 5: Secondary | ICD-10-CM

## 2018-06-20 DIAGNOSIS — E1165 Type 2 diabetes mellitus with hyperglycemia: Secondary | ICD-10-CM

## 2018-06-20 DIAGNOSIS — I639 Cerebral infarction, unspecified: Secondary | ICD-10-CM

## 2018-06-20 LAB — CBC
HCT: 29.1 % — ABNORMAL LOW (ref 36.0–46.0)
Hemoglobin: 8.8 g/dL — ABNORMAL LOW (ref 12.0–15.0)
MCH: 29.6 pg (ref 26.0–34.0)
MCHC: 30.2 g/dL (ref 30.0–36.0)
MCV: 98 fL (ref 80.0–100.0)
Platelets: 244 10*3/uL (ref 150–400)
RBC: 2.97 MIL/uL — AB (ref 3.87–5.11)
RDW: 13.9 % (ref 11.5–15.5)
WBC: 13.8 10*3/uL — ABNORMAL HIGH (ref 4.0–10.5)
nRBC: 0 % (ref 0.0–0.2)

## 2018-06-20 LAB — BASIC METABOLIC PANEL
Anion gap: 11 (ref 5–15)
BUN: 84 mg/dL — ABNORMAL HIGH (ref 8–23)
CO2: 25 mmol/L (ref 22–32)
Calcium: 8.7 mg/dL — ABNORMAL LOW (ref 8.9–10.3)
Chloride: 103 mmol/L (ref 98–111)
Creatinine, Ser: 5.85 mg/dL — ABNORMAL HIGH (ref 0.44–1.00)
GFR calc Af Amer: 7 mL/min — ABNORMAL LOW (ref >60)
GFR calc non Af Amer: 6 mL/min — ABNORMAL LOW (ref >60)
Glucose, Bld: 166 mg/dL — ABNORMAL HIGH (ref 70–99)
POTASSIUM: 3.5 mmol/L (ref 3.5–5.1)
Sodium: 139 mmol/L (ref 135–145)

## 2018-06-20 LAB — HEMOGLOBIN A1C
Hgb A1c MFr Bld: 7.5 % — ABNORMAL HIGH (ref 4.8–5.6)
Mean Plasma Glucose: 168.55 mg/dL

## 2018-06-20 LAB — GLUCOSE, CAPILLARY
Glucose-Capillary: 151 mg/dL — ABNORMAL HIGH (ref 70–99)
Glucose-Capillary: 200 mg/dL — ABNORMAL HIGH (ref 70–99)
Glucose-Capillary: 188 mg/dL — ABNORMAL HIGH (ref 70–99)
Glucose-Capillary: 130 mg/dL — ABNORMAL HIGH (ref 70–99)
Glucose-Capillary: 150 mg/dL — ABNORMAL HIGH (ref 70–99)

## 2018-06-20 LAB — LIPID PANEL
Cholesterol: 152 mg/dL (ref 0–200)
HDL: 34 mg/dL — ABNORMAL LOW (ref >40)
LDL Cholesterol: 77 mg/dL (ref 0–99)
Total CHOL/HDL Ratio: 4.5 RATIO
Triglycerides: 207 mg/dL — ABNORMAL HIGH (ref <150)
VLDL: 41 mg/dL — ABNORMAL HIGH (ref 0–40)

## 2018-06-20 LAB — MAGNESIUM: MAGNESIUM: 2.1 mg/dL (ref 1.7–2.4)

## 2018-06-20 MED ORDER — LORAZEPAM 2 MG/ML IJ SOLN
1.0000 mg | INTRAMUSCULAR | Status: AC
  Administered 2018-06-20: 1 mg via INTRAVENOUS
  Filled 2018-06-20: qty 1

## 2018-06-20 MED ORDER — FUROSEMIDE 40 MG PO TABS
40.0000 mg | ORAL_TABLET | Freq: Every day | ORAL | Status: DC
  Administered 2018-06-21 – 2018-06-25 (×5): 40 mg via ORAL
  Filled 2018-06-20 (×5): qty 1

## 2018-06-20 MED ORDER — ISOSORBIDE MONONITRATE ER 60 MG PO TB24
60.0000 mg | ORAL_TABLET | Freq: Every day | ORAL | Status: DC
  Administered 2018-06-21 – 2018-06-25 (×5): 60 mg via ORAL
  Filled 2018-06-20 (×5): qty 1

## 2018-06-20 MED ORDER — LORAZEPAM 2 MG/ML IJ SOLN
1.0000 mg | INTRAMUSCULAR | Status: AC | PRN
  Administered 2018-06-20 (×2): 1 mg via INTRAVENOUS
  Filled 2018-06-20 (×4): qty 1

## 2018-06-20 MED ORDER — HYDROCODONE-ACETAMINOPHEN 5-325 MG PO TABS
1.0000 | ORAL_TABLET | Freq: Once | ORAL | Status: AC
  Administered 2018-06-20: 1 via ORAL

## 2018-06-20 NOTE — Progress Notes (Signed)
  Speech Language Pathology Treatment: Dysphagia  Patient Details Name: Paige Arnold MRN: [id number] DOB: 03-14-1941 Today's Date: 06/20/2018 Time: 5953-9672 SLP Time Calculation (min) (ACUTE ONLY): 11 min  Assessment / Plan / Recommendation Clinical Impression  Pt with improving MS.  Continues with flat affect, oriented to person and year/month, not to situation.  Overall attention is improved with normal mastication of regular solids, brisk swallow, no overt s/s of aspiration.  Dysphagia appears to have resolved.  Lungs are clear and pt is afebrile.  Recommend advancing diet to regular solids, thin liquids.  No SLP f/u needed re: swallowing; continue to follow for cognition.    HPI HPI: Paige Arnold is a 77 y.o. female with PMH significant of CKD stage IV, HTN, DM, morbid obesity, GERD, PNA (2016), and chronic pain who has had progressive mild jerky movements concerning for possible seizures over the last few weeks. Pt transferred from [facility] to [facility] 06/15/18 with agitation, confusion, systolic blood pressure about 240. Patient presents now with metabolic encephalopathy, work up underway. Yale swallow performed 12/1, pt noted to be lethargic and disoriented.      SLP Plan  Other (Comment)(swallow goals met)       Recommendations  Diet recommendations: Regular;Thin liquid Liquids provided via: Cup;Straw Medication Administration: Whole meds with puree Supervision: Intermittent supervision to cue for compensatory strategies Compensations: Minimize environmental distractions Postural Changes and/or Swallow Maneuvers: Seated upright 90 degrees                Oral Care Recommendations: Oral care BID SLP Visit Diagnosis: Dysphagia, unspecified (R13.10) Plan: Other (Comment)(swallow goals met)                     Estill Bamberg L. Tivis Ringer, [place] CCC/SLP Acute Rehabilitation Services Office number [phone] Pager [phone]   Juan Quam Laurice 06/20/2018, 10:20 AM

## 2018-06-20 NOTE — Progress Notes (Signed)
Pre dialysis access evaluation completed. Please see CV PROC on chart review.    Paige Arnold H Paige Arnold(RDMS RVT) 06/20/18 5:12 PM

## 2018-06-20 NOTE — Progress Notes (Signed)
[facility] Progress Note    Assessment/ Plan:   1. Acute on chronic kidney disease stage V: Cr up to 5 initially on [place], initially improved to 4.6 with treatment of blood pressure and acute diastolic CHF and now in the mid 5s.  [place] with some blood and RBCs and WBCs- will order culture, pending.  CK mildly elevated at 537- recheck better at ~500.  Ordered renal US- no obstruction.  I don't think she's uremic as she's had a marked improvement in mentation with control of BP and d/c of narcs.  Will c/s VVS for AVG placement while here but don't necessarily think she needs to start yet, appreciate assistance.  I'll closely monitor mental status and signs of uremia daily because she's quite variable day by day.  2.  Acute encephalopathy/ ? seizures: CT head negative, MRI showing some acute/ subacute posterosuperior frontal lobe infarcts + R anterior frontal lobe--> lots of motion artifact, trying for MRI again to eval for PRES.  Getting HIV and RPR (neg), off gabapentin on MAR.  Ammonia 16, Tylenol and salicylate levels negative--> but pt was admitted 11/30, LFTs normal.  Minimal narcs on MAR.  Possible combo of hypertensive encephalopathy and polypharmacy (see below).  Routine EEG without seizure activity.  Carotid US without obvious stenosis.  3.  Hypertensive urgency: on nitropaste, has transitioned to clonidine patch and hydralazine prn--> of note, inderal was started 11/27 but don't favor that medication for control of BP.  OP meds list only clonidine and Lasix.  Swtiched from nitropaste to Imdur and from clonidine patch to PO with reasonable control of pressures, will increase Imdur today and decrease Lasix to 40 mg daily as I think she's not taking in a whole lot of PO.  4.  Acute diastolic CHF exacerbation: TTE at [facility] with concentric LVH, TTE here suboptimal study but EF 65-70% without WMA .  Lasix as above.  5.  NSTEMI: s/p hep gtt (? Demand ischemia), per  primary.  6.  Polypharmacy: off gabapentin, getting too much morhpine- has been reduced  7.  Dispo: pending  Subjective:    MRI yesterday showing severe motion artifact, couldn't evaluate for PRES, to have repeat again (this will be 3rd time) today.     Objective:   BP (!) 176/78 (BP Location: Left Arm)   Pulse 72   Temp 98 F (36.7 C) (Oral)   Resp 13   Ht 5\' 5"  (1.651 m)   Wt 87.2 kg   SpO2 100%   BMI 31.99 kg/m   Intake/Output Summary (Last 24 hours) at 06/20/2018 1349 Last data filed at 06/20/2018 0500 Gross per 24 hour  Intake -  Output 1025 ml  Net -1025 ml   Weight change:   Physical Exam: Gen: chronically ill-appearing, sleepy but arousable CVS: RRR today Resp: clear bilaterally Abd: soft, nontender Ext: no LE edema Neuro: no asterixis ACCESS: LUE AVF nonpulsatile  Imaging: Mr Brain Wo Contrast  Result Date: 06/19/2018 CLINICAL DATA:  77 y/o F; multiple falls and progressive worsening jerking movements with concern for seizures. EXAM: MRI HEAD WITHOUT CONTRAST TECHNIQUE: Coronal T2 thin and axial T2 blade sequences were acquired. The patient could not hold still for the study and additional sequences were not acquired. COMPARISON:  06/18/2018 and 04/28/2013 MRI of the head. 06/14/2018 CT head. FINDINGS: Motion degraded axial and coronal T2 weighted sequences. Small focus of T2 hyperintensity is present in the right posterosuperior frontal lobe corresponding to suspected acute/early subacute infarction on the  prior MRI of the head. Several stable nonspecific T2 hyperintensities in subcortical and periventricular white matter are compatible with mild chronic microvascular ischemic changes for age. Stable volume loss of the brain. No new focal mass effect, hydrocephalus, or herniation. No abnormal T2 signal of the paranasal sinuses or mastoid air cells. Bilateral intra-ocular lens replacement. Hyperostosis frontalis interna. No acute abnormality of scalp or curve ileum  is evident on T2 weighted sequences. IMPRESSION: 1. Severe motion artifact of acquired coronal and axial T2 weighted sequences. Suboptimal assessment for structural causes of seizure. Additional sequences could not be acquired. Consider repeat MRI when patient is able to hold still. 2. Small focus of hyperintensity in the right posterosuperior frontal lobe corresponding to the suspected acute/early subacute infarction on the prior MRI of the head. Electronically Signed   By: Kristine Garbe M.D.   On: 06/19/2018 22:56   Mr Brain Wo Contrast  Result Date: 06/18/2018 CLINICAL DATA:  77 y/o F; progressive mild jerking movements with concern for possible seizures. Altered mental status. Metabolic encephalopathy. EXAM: MRI HEAD WITHOUT CONTRAST TECHNIQUE: Axial DWI and axial T2 FLAIR sequences were acquired. The patient declined to continue the examination and additional sequences were not acquired. COMPARISON:  06/14/2018 CT head.  04/28/2013 MRI head. FINDINGS: Approximately 1 cm foci of reduced diffusion are present within the posterosuperior frontal lobes bilaterally (series 5 image 88 and 91). Additional possible punctate focus of reduced diffusion within the right anterolateral frontal lobe (series 5, image 84). Foci of reduced diffusion demonstrate associated increased T2 FLAIR hyperintense signal abnormality and are new from the prior MRI of the brain. Several nonspecific T2 FLAIR hyperintensities in subcortical and periventricular white matter are compatible with mild chronic microvascular ischemic changes for age. Mild volume loss of the brain. No mass effect, appreciable extra-axial collection, hydrocephalus, or herniation. No abnormal diffusion or T2 FLAIR signal abnormality of calvarium, orbits, sinuses, or mastoid air cells. IMPRESSION: 1. Exam limited to DWI and T2 FLAIR sequences. 2. Approximately 1 cm foci of reduced diffusion in the posterosuperior frontal lobes and possible additional  punctate focus in the right anterior frontal lobe. Findings probably represent acute/early subacute infarctions. Findings are atypical for seizure related activity. 3. Mild chronic microvascular ischemic changes and volume loss of the brain. These results will be called to the ordering clinician or representative by the Radiologist Assistant, and communication documented in the PACS or zVision Dashboard. Electronically Signed   By: Kristine Garbe M.D.   On: 06/18/2018 17:09    Labs: BMET Recent Labs  Lab 06/15/18 1958 06/16/18 1020 06/17/18 0225 06/18/18 0811 06/19/18 0350 06/20/18 0255  NA 142 145 142 142 140 139  K 3.7 3.6 3.2* 3.5 3.1* 3.5  CL 100 103 101 101 101 103  CO2 25 28 26 28 25 25   GLUCOSE 198* 241* 158* 204* 169* 166*  BUN 66* 77* 83* 85* 86* 84*  CREATININE 4.59* 5.75* 5.58* 5.87* 5.70* 5.85*  CALCIUM 9.4 9.5 9.2 8.9 8.6* 8.7*  PHOS  --   --   --  5.5*  --   --    CBC Recent Labs  Lab 06/17/18 0225 06/18/18 0811 06/19/18 0350 06/20/18 0255  WBC 19.8* 15.0* 12.1* 13.8*  NEUTROABS  --   --  9.4*  --   HGB 11.0* 9.5* 8.8* 8.8*  HCT 34.8* 31.1* 28.6* 29.1*  MCV 96.7 99.7 97.6 98.0  PLT 302 239 234 244    Medications:    . aspirin EC  81 mg  Oral Daily  . cloNIDine  0.1 mg Oral BID  . donepezil  10 mg Oral QHS  . DULoxetine  60 mg Oral Daily  . [START ON 06/21/2018] furosemide  40 mg Oral Daily  . heparin injection (subcutaneous)  5,000 Units Subcutaneous Q8H  . insulin aspart  0-9 Units Subcutaneous Q4H  . [START ON 06/21/2018] isosorbide mononitrate  60 mg Oral Daily  . levothyroxine  125 mcg Oral Q0600  . mouth rinse  15 mL Mouth Rinse BID  . rosuvastatin  10 mg Oral Daily  . sodium chloride flush  3 mL Intravenous Q12H      Madelon Lips, MD 06/20/2018, 1:49 PM

## 2018-06-20 NOTE — Progress Notes (Addendum)
STROKE TEAM PROGRESS NOTE   HISTORY OF PRESENT ILLNESS (per record) Paige Arnold is a 77 y.o. female who was brought in to [facility] for evaluation of multiple falls, weakness and progressively worsening jerking movement concerning for seizures that have been ongoing for the past few weeks to couple months. (Sees Dr. Narda Amber in the neurology clinic outpatient.)   SUBJECTIVE (INTERVAL HISTORY) Her RN is at bedside. Mentation has improved. She is gen weak on exam. MRI confirms bilateral scattered, mostly cortical infarcts.   OBJECTIVE Vitals:   06/19/18 0732 06/19/18 1821 06/19/18 2332 06/20/18 0826  BP: (!) 135/59 (!) 131/50 (!) 154/55 (!) 176/78  Pulse: 66 61 71 72  Resp:   18 13  Temp:  98.4 F (36.9 C)  98 F (36.7 C)  TempSrc:    Oral  SpO2: 98% 100% 100% 100%  Weight:      Height:        CBC:  Recent Labs  Lab 06/19/18 0350 06/20/18 0255  WBC 12.1* 13.8*  NEUTROABS 9.4*  --   HGB 8.8* 8.8*  HCT 28.6* 29.1*  MCV 97.6 98.0  PLT 234 182    Basic Metabolic Panel:  Recent Labs  Lab 06/18/18 0811 06/19/18 0350 06/20/18 0255  NA 142 140 139  K 3.5 3.1* 3.5  CL 101 101 103  CO2 28 25 25   GLUCOSE 204* 169* 166*  BUN 85* 86* 84*  CREATININE 5.87* 5.70* 5.85*  CALCIUM 8.9 8.6* 8.7*  MG  --  2.0 2.1  PHOS 5.5*  --   --     Lipid Panel:     Component Value Date/Time   CHOL 152 06/20/2018 0255   TRIG 207 (H) 06/20/2018 0255   HDL 34 (L) 06/20/2018 0255   CHOLHDL 4.5 06/20/2018 0255   VLDL 41 (H) 06/20/2018 0255   LDLCALC 77 06/20/2018 0255   HgbA1c:  Lab Results  Component Value Date   HGBA1C 7.5 (H) 06/20/2018   Urine Drug Screen:     Component Value Date/Time   LABOPIA PPS 05/13/2015 1211   COCAINSCRNUR NEG 05/13/2015 1211   LABBENZ NEG 05/13/2015 1211   AMPHETMU NEG 05/13/2015 1211   THCU NEG 05/13/2015 1211   LABBARB NEG 05/13/2015 1211    Alcohol Level No results found for: [facility]  IMAGING Reviewed:  Mr Brain Wo  Contrast  Result Date: 06/20/2018 CLINICAL DATA:  77 y/o F; multiple falls and progressive worsening jerking movements. Concern for seizures. Possible side effect of daily motion. EXAM: MRI HEAD WITHOUT CONTRAST TECHNIQUE: Multiplanar, multiecho pulse sequences of the brain and surrounding structures were obtained without intravenous contrast. COMPARISON:  06/19/2018 an 06/18/2018 MRI head. FINDINGS: Brain: Small foci of reduced diffusion are present in the posterosuperior frontal lobes, right anterolateral frontal lobe, right putamen, and the right occipital lobe. Complete corpus callosum and vermis. Normal pituitary gland. No cortical dysplasia, heterotopia, or disorder of cortical formation identified. No extra-axial collection, hydrocephalus, mass effect, or herniation. Few stable nonspecific T2 FLAIR hyperintensities in subcortical and periventricular white matter are compatible with mild chronic microvascular ischemic changes for age. Mild stable volume loss of the brain. Punctate focus of susceptibility hypointensity is present in the right superomedial frontal parietal junction without signal correlate on additional sequences compatible with hemosiderin deposition of chronic microhemorrhage. Hippocampi are symmetric in size and signal bilaterally. Vascular: Normal flow voids. Skull and upper cervical spine: Normal marrow signal. Sinuses/Orbits: Negative.  Bilateral intra-ocular lens replacement. Other: None. IMPRESSION: 1. Several small  foci of reduced diffusion are present in the posterosuperior frontal lobes, right anterolateral frontal lobe, right putamen, right occipital lobe. Findings likely represent areas of acute/early subacute infarction and/or atypical for seizure related activity. Newly visible punctate lesions were likely obscured by motion artifact on the 06/18/2018 MRI of the head. 2. No structural cause of seizure identified. 3. Stable mild chronic microvascular ischemic changes and volume  loss of the brain. Electronically Signed   By: Kristine Garbe M.D.   On: 06/20/2018 13:56   Mr Brain Wo Contrast  Result Date: 06/19/2018 CLINICAL DATA:  77 y/o F; multiple falls and progressive worsening jerking movements with concern for seizures. EXAM: MRI HEAD WITHOUT CONTRAST TECHNIQUE: Coronal T2 thin and axial T2 blade sequences were acquired. The patient could not hold still for the study and additional sequences were not acquired. COMPARISON:  06/18/2018 and 04/28/2013 MRI of the head. 06/14/2018 CT head. FINDINGS: Motion degraded axial and coronal T2 weighted sequences. Small focus of T2 hyperintensity is present in the right posterosuperior frontal lobe corresponding to suspected acute/early subacute infarction on the prior MRI of the head. Several stable nonspecific T2 hyperintensities in subcortical and periventricular white matter are compatible with mild chronic microvascular ischemic changes for age. Stable volume loss of the brain. No new focal mass effect, hydrocephalus, or herniation. No abnormal T2 signal of the paranasal sinuses or mastoid air cells. Bilateral intra-ocular lens replacement. Hyperostosis frontalis interna. No acute abnormality of scalp or curve ileum is evident on T2 weighted sequences. IMPRESSION: 1. Severe motion artifact of acquired coronal and axial T2 weighted sequences. Suboptimal assessment for structural causes of seizure. Additional sequences could not be acquired. Consider repeat MRI when patient is able to hold still. 2. Small focus of hyperintensity in the right posterosuperior frontal lobe corresponding to the suspected acute/early subacute infarction on the prior MRI of the head. Electronically Signed   By: Kristine Garbe M.D.   On: 06/19/2018 22:56   Mr Brain Wo Contrast  Result Date: 06/18/2018 CLINICAL DATA:  77 y/o F; progressive mild jerking movements with concern for possible seizures. Altered mental status. Metabolic  encephalopathy. EXAM: MRI HEAD WITHOUT CONTRAST TECHNIQUE: Axial DWI and axial T2 FLAIR sequences were acquired. The patient declined to continue the examination and additional sequences were not acquired. COMPARISON:  06/14/2018 CT head.  04/28/2013 MRI head. FINDINGS: Approximately 1 cm foci of reduced diffusion are present within the posterosuperior frontal lobes bilaterally (series 5 image 88 and 91). Additional possible punctate focus of reduced diffusion within the right anterolateral frontal lobe (series 5, image 84). Foci of reduced diffusion demonstrate associated increased T2 FLAIR hyperintense signal abnormality and are new from the prior MRI of the brain. Several nonspecific T2 FLAIR hyperintensities in subcortical and periventricular white matter are compatible with mild chronic microvascular ischemic changes for age. Mild volume loss of the brain. No mass effect, appreciable extra-axial collection, hydrocephalus, or herniation. No abnormal diffusion or T2 FLAIR signal abnormality of calvarium, orbits, sinuses, or mastoid air cells. IMPRESSION: 1. Exam limited to DWI and T2 FLAIR sequences. 2. Approximately 1 cm foci of reduced diffusion in the posterosuperior frontal lobes and possible additional punctate focus in the right anterior frontal lobe. Findings probably represent acute/early subacute infarctions. Findings are atypical for seizure related activity. 3. Mild chronic microvascular ischemic changes and volume loss of the brain. These results will be called to the ordering clinician or representative by the Radiologist Assistant, and communication documented in the PACS or zVision Dashboard. Electronically Signed  By: Kristine Garbe M.D.   On: 06/18/2018 17:09     Transthoracic Echocardiogram -Done 12/2, but poor quality. 65% and no hypokinesis noted.   Bilateral Carotid Dopplers - pending 00/00/00  PHYSICAL EXAM Blood pressure (!) 176/78, pulse 72, temperature 98 F (36.7  C), temperature source Oral, resp. rate 13, height 5\' 5"  (1.651 m), weight 87.2 kg, SpO2 100 %.  General: Patient awake, alert in no apparent distress. HEENT: Normocephalic atraumatic dry oral mucous murmurs Lungs: Clear to Clear to auscultation bilaterally with no wheezes CV - S1S2 RRR, no m/r/g, equal pulses bilaterally. ABDOMEN - Soft, nontender, nondistended with normoactive BS Ext: warm, well perfused, intact peripheral pulses, 1+ edema  NEURO:  Mental status: Patient is awake, alert, oriented x3.  She was able to tell me the month, the oncoming holiday, the president, where she is right now.  Her speech is not dysarthric, but is slow to respond all questions. Naming, comprehension and repetition are intact but attention and concentration is reduced. Cranial nerves: Pupils are equal round reactive light, extraocular movements are intact, visual fields are full to threat, face appears to be symmetric at rest and upon smiling, auditory acuity intact to conversational voice, palate elevates symmetrically, shoulder shrug intact, tongue midline. Motor exam: She has mild hemiparesis 4+/5 left upper and lower extremity, 5/5 right upper and lower extremity.  no further asterixis on outstretched arms. Sensory exam: Intact light touch all over without extinction. Coordination: Intact finger-nose-finger DTRs: Mute NIHSS 1a Level of Conscious.: 0 1b LOC Questions: 1 1c LOC Commands: 0 2 Best Gaze: 0 3 Visual: 0 4 Facial Palsy: 0 5a Motor Arm - left: 1 5b Motor Arm - Right: 0 6a Motor Leg - Left: 0 6b Motor Leg - Right: 0 7 Limb Ataxia: 0 8 Sensory: 0 9 Best Language: 0 10 Dysarthria: 0 11 Extinct. and Inatten.: 0 TOTAL: 2     HOME MEDICATIONS:  Medications Prior to Admission  Medication Sig Dispense Refill  . acetaminophen (TYLENOL) 500 MG tablet Take 1,000 mg by mouth every 6 (six) hours as needed for moderate pain or headache.    . Alum Hydroxide-Mag Trisilicate (GAVISCON)  31-54.0 MG CHEW Chew 1 tablet by mouth daily as needed (heartburn).    . cetirizine (ZYRTEC) 10 MG tablet Take 10 mg by mouth daily.  0  . cloNIDine (CATAPRES) 0.1 MG tablet Take 0.1 mg by mouth 2 (two) times daily.     . diclofenac sodium (VOLTAREN) 1 % GEL Apply 2 g topically 4 (four) times daily.    Marland Kitchen donepezil (ARICEPT) 10 MG tablet Take 1 tablet (10 mg total) by mouth at bedtime. 90 tablet 3  . Dulaglutide (TRULICITY) 1.5 GQ/6.7YP SOPN Inject 1.5 mg into the skin every Sunday.    . DULoxetine (CYMBALTA) 60 MG capsule Take 60 mg by mouth daily.    . furosemide (LASIX) 80 MG tablet Take 80 mg by mouth 2 (two) times daily.     Marland Kitchen gabapentin (NEURONTIN) 300 MG capsule Take 300 mg by mouth 2 (two) times daily.    Marland Kitchen glimepiride (AMARYL) 4 MG tablet Take 6 mg by mouth daily with breakfast.     . levothyroxine (SYNTHROID, LEVOTHROID) 125 MCG tablet Take 125 mcg by mouth daily before breakfast.    . lidocaine (LIDODERM) 5 % Place 1 patch onto the skin daily. Remove & Discard patch within 12 hours or as directed by MD (Patient taking differently: Place 1 patch onto the skin daily as needed (  pain). Remove & Discard patch within 12 hours or as directed by MD) 30 patch 1  . morphine (MSIR) 15 MG tablet Take 1 tablet (15 mg total) by mouth 3 (three) times daily. 90 tablet 0  . Multiple Vitamins-Minerals (CENTRUM SILVER PO) Take 1 tablet by mouth daily.    . Omega-3 Fatty Acids (FISH OIL) 1000 MG CAPS Take 1,000 mg by mouth 2 (two) times daily.     Marland Kitchen OVER THE COUNTER MEDICATION Take 1 tablet by mouth daily. NeuRx TF    . pioglitazone (ACTOS) 15 MG tablet Take 15 mg by mouth daily.    . propranolol (INDERAL) 20 MG tablet Take 1 tablet (20 mg total) by mouth 3 (three) times daily. 270 tablet 3  . ranitidine (ZANTAC) 150 MG tablet Take 150 mg by mouth 2 (two) times daily.    . rosuvastatin (CRESTOR) 10 MG tablet Take 10 mg by mouth daily.    Marland Kitchen triamcinolone ([employer] NASAL ALLERGY SPRAY) 55 MCG/ACT AERO nasal  inhaler Place 2 sprays into the nose daily as needed (allergies).    . vitamin B-12 (CYANOCOBALAMIN) 1000 MCG tablet Take 1,000 mcg by mouth daily.        HOSPITAL MEDICATIONS:  . aspirin EC  81 mg Oral Daily  . cloNIDine  0.1 mg Oral BID  . donepezil  10 mg Oral QHS  . DULoxetine  60 mg Oral Daily  . [START ON 06/21/2018] furosemide  40 mg Oral Daily  . heparin injection (subcutaneous)  5,000 Units Subcutaneous Q8H  . insulin aspart  0-9 Units Subcutaneous Q4H  . [START ON 06/21/2018] isosorbide mononitrate  60 mg Oral Daily  . levothyroxine  125 mcg Oral Q0600  . mouth rinse  15 mL Mouth Rinse BID  . rosuvastatin  10 mg Oral Daily  . sodium chloride flush  3 mL Intravenous Q12H    ALLERGIES Allergies  Allergen Reactions  . Oxycodone Other (See Comments)    [place] Record - Per pain clinic Dr, "pt to no take, has taken too long"  . Sulfa Antibiotics Itching   ASSESSMENT/PLAN 77 year old woman with past medical history as above with 4 to 8 weeks of frequent falls, generalized body shakes brought in for altered mental status and an outside hospital and transferred to [facility] for higher level of care when she was found to have worsening renal function.  # Subacute strokes- cardioembolic looking bilateral and mostly cortical seen on MRI  -Echo done, but poor  -LDL 77, goal <70. Already on Crestor at home; continue this  -Already on ASA, continue this unless Afib is found, then [facility] indicated  -If no Afib, consider Loop placement # CKDV- worsening over course of admit. Cannot CTA # Encephalopathy/gen weakness- d/t above plus some toxic-metabolic effect contributing.  # DM2 with hyperglycemia- SSI for good control. A1c 7.5  Hospital day # 5 MRA brain Ativan on call to MR d/t anxiety with this CUS to completed stroke wk up  To contact Stroke Continuity provider, please refer to [email or web address] After hours, contact General Neurology   Attending Neurohospitalist Addendum Patient  seen and examined with APP/Resident. Agree with the history and physical as documented above. Agree with the plan as documented, which I helped formulate. I have independently reviewed the chart, obtained history, review of systems and examined the patient.I have personally reviewed pertinent head/neck/spine imaging (CT/MRI).  Imaging reviewed.  Finally was able to get an MRI under sedation. Consistent with embolic-looking strokes. Stroke team to follow in  the morning  Please feel free to call with any questions. --- Amie Portland, MD [facility] Pager: [phone]  If 7pm to 7am, please call on call as listed on AMION.

## 2018-06-20 NOTE — Progress Notes (Signed)
Physical Therapy Treatment Patient Details Name: Paige Arnold MRN: [id number] DOB: 1941/06/16 Today's Date: 06/20/2018    History of Present Illness 77 y.o. female with medical history significant of chronic kidney disease stage IV, hypertension, diabetes, morbid obesity, GERD, osteoarthritis and chronic pain who has had progressive mild jerky movements concerning for possible seizures over the last few weeks in addition to AMS. Patient presents now with metabolic encephalopathy, work up underway.    PT Comments    Patient cont to demonstrate cognitive deficits, did not recall ambulating with me this week in hallway, says she is in pain all over and displays poor awareness/self limiting behavior with encouragement walking with close supervision level and RW. Cont to rec SNF, will progress activity next visit.      Follow Up Recommendations  SNF;Supervision/Assistance - 24 hour     Equipment Recommendations  None recommended by PT    Recommendations for Other Services       Precautions / Restrictions Precautions Precautions: Fall Restrictions Weight Bearing Restrictions: No    Mobility  Bed Mobility Overal bed mobility: Needs Assistance Bed Mobility: Supine to Sit;Sit to Supine     Supine to sit: Min assist;HOB elevated Sit to supine: Min assist;HOB elevated      Transfers Overall transfer level: Needs assistance Equipment used: Rolling walker (2 wheeled) Transfers: Sit to/from Stand Sit to Stand: Min assist            Ambulation/Gait Ambulation/Gait assistance: Min assist;Min guard Gait Distance (Feet): 25 Feet Assistive device: Rolling walker (2 wheeled) Gait Pattern/deviations: Step-to pattern;Step-through pattern Gait velocity: decreased   General Gait Details: cues for safety on, prox with walker. pt rrequesting to return to bed and not ambulate in hallway today. does not recall walking with me this week.   Stairs             Wheelchair  Mobility    Modified Rankin (Stroke Patients Only)       Balance Overall balance assessment: Needs assistance Sitting-balance support: No upper extremity supported Sitting balance-Leahy Scale: Fair     Standing balance support: Bilateral upper extremity supported Standing balance-Leahy Scale: Poor                              Cognition Arousal/Alertness: Awake/alert Behavior During Therapy: Flat affect Overall Cognitive Status: Impaired/Different from baseline Area of Impairment: Attention;Problem solving;Safety/judgement                   Current Attention Level: Selective         Problem Solving: Slow processing General Comments: Pt with memory deficits noted, unsure of affect is at baseline, flat  at this time with questionable awareness. contoinues to say she has pain all over and can't move but walks with Supervision and no grimacing       Exercises      General Comments        Pertinent Vitals/Pain Pain Assessment: No/denies pain    Home Living                      Prior Function            PT Goals (current goals can now be found in the care plan section) Acute Rehab PT Goals Patient Stated Goal: none stated PT Goal Formulation: With patient Time For Goal Achievement: 06/30/18 Potential to Achieve Goals: Good Progress towards PT goals: Progressing toward goals  Frequency    Min 2X/week      PT Plan Current plan remains appropriate    Co-evaluation              AM-PAC PT "6 Clicks" Mobility   Outcome Measure  Help needed turning from your back to your side while in a flat bed without using bedrails?: A Lot Help needed moving from lying on your back to sitting on the side of a flat bed without using bedrails?: A Lot Help needed moving to and from a bed to a chair (including a wheelchair)?: A Lot Help needed standing up from a chair using your arms (e.g., wheelchair or bedside chair)?: A Lot Help  needed to walk in hospital room?: A Lot Help needed climbing 3-5 steps with a railing? : Total 6 Click Score: 11    End of Session Equipment Utilized During Treatment: Gait belt Activity Tolerance: Patient limited by fatigue;Patient limited by lethargy Patient left: in bed;with call bell/phone within reach;with bed alarm set;with restraints reapplied;with nursing/sitter in room Nurse Communication: Mobility status PT Visit Diagnosis: Difficulty in walking, not elsewhere classified (R26.2);Other symptoms and signs involving the nervous system (R29.898)     Time: 0950-1005 PT Time Calculation (min) (ACUTE ONLY): 15 min  Charges:  $Gait Training: 8-22 mins                    Reinaldo Berber, PT, DPT Acute Rehabilitation Services Pager: [phone] Office: [phone]     Reinaldo Berber 06/20/2018, 12:05 PM

## 2018-06-20 NOTE — Progress Notes (Signed)
PROGRESS NOTE  Paige Arnold KJZ:[id number] DOB: 12-13-1940 DOA: 06/15/2018 PCP: Angelina Sheriff, MD   LOS: 5 days   Brief Narrative / Interim history: 77 y.o.femalew/ a hx of CKD stage IV, HTN, diabetes, obesity, GERD, osteoarthritis and chronic pain who reported progressive mild jerky movements concerning for possible seizures over a few weeks, followed by a 30-minute stage of general confusion. She was seen by her Neurologist who recommended an MRI and adjusted her medications (renal dosing) but her symptoms have gotten worse in the interim. Upon presentation to the ED she was agitated and confused. She was found to have a systolic of 948. CT head was normal. Exam noted no evidence of focal neurologic deficit. Her creatinine was 4.6 with a baseline creatinine of 3.6 about a month prior. Troponin steadily rose frome 0.3 > 0.9.  Cardiology recommended medical management with heparin and nitroglycerin for NSTEMI. Given her worsening renal fxn she was transferred to [facility].   Subjective: No acute complaint no nausea no vomiting no fever no chills.  Underwent repeat MRI which she was not able to complete due to claustrophobia.  Assessment & Plan: Principal Problem:   Metabolic encephalopathy Active Problems:   Diabetes mellitus type 2 with complications, uncontrolled (HCC)   Chronic kidney disease (CKD), stage IV (severe) (HCC)   Hypertensive urgency   Hypothyroidism   Acute metabolic encephalopathy   NSTEMI (non-ST elevated myocardial infarction) (HCC)   Principal Problem Acute metabolic encephalopathy Likely combination of polypharmacy as well as hypertensive encephalopathy Possible pres versus subacute CVA on MRI Also combination with possible pres to severe high blood pressure  -EEG obtained which showed generalized nonspecific cerebral dysfunction without seizure activity, very nonspecific -Likely combination of polypharmacy as well as hypertensive encephalopathy. Check MRI  brain Discontinue morphine. Transition to Norco to avoid morphine metabolites. Gradually increase Norco back to equivalent of 45 mg of morphine. Outpatient follow-up with pain management clinic Appreciate neurologic input. MRI is positive for acute stroke. Stroke team will follow-up. Neurology initiated stroke work-up.  Additional Problems Acute kidney injury on chronic kidney disease stage IV-V -Baseline creatinine in the mid threes, currently in the 5 range.  Nephrology consulted, discussed with Dr. Hollie Salk, appreciate input and defer further management to nephrology Vascular surgery consulted for graft placement.  Type 2 diabetes mellitus, poorly controlled, with renal complication -Most recent A1c 7.5. -Continue sliding scale, hold home medications  Hypertensive urgency -Initial blood pressure at 220-240, status post Cardene drip utilized at [employer] -Continue clonidine patch and IV hydralazine here, she is lethargic at times and p.o. intake is not consistent  Chronic pain -Has been on morphine 3 times daily for a long time.  Her morphine is likely toxic in the setting of worsening renal function given excellent response to Narcan Received Narcan twice 06/17/2018 Daughter is at bedside and extremely vocal regarding withdrawals and she would not want her mother to go through withdrawals.   Discontinue morphine. Transition to Norco and monitor.  Recommend to avoid morphine going forward.  Elevated TSH -Continue home regimen and repeat TSH as an outpatient.  Acute diastolic CHF -Since admission at [facility] patient diuresed over 3 L of fluids, no previous heart history.  2D echo here normal LVEF.  Currently she is euvolemic  Dementia -Continue Aricept, apparently at baseline she is highly functional   Scheduled Meds: . aspirin EC  81 mg Oral Daily  . cloNIDine  0.1 mg Oral BID  . donepezil  10 mg Oral QHS  .  DULoxetine  60 mg Oral Daily  . [START ON 06/21/2018] furosemide  40  mg Oral Daily  . heparin injection (subcutaneous)  5,000 Units Subcutaneous Q8H  . insulin aspart  0-9 Units Subcutaneous Q4H  . [START ON 06/21/2018] isosorbide mononitrate  60 mg Oral Daily  . levothyroxine  125 mcg Oral Q0600  . mouth rinse  15 mL Mouth Rinse BID  . rosuvastatin  10 mg Oral Daily  . sodium chloride flush  3 mL Intravenous Q12H   Continuous Infusions: . sodium chloride 10 mL/hr at 06/16/18 2000   PRN Meds:.sodium chloride, [DISCONTINUED] acetaminophen **OR** [DISCONTINUED] acetaminophen (TYLENOL) oral liquid 160 mg/5 mL **OR** acetaminophen, haloperidol lactate, HYDROcodone-acetaminophen, LORazepam, sodium chloride flush  DVT prophylaxis: heparin Code Status: Full code Family Communication: Extensive discussion with daughter at bedside Disposition Plan: Home when ready  Consultants:   Nephrology  Procedures:   2D echo Impressions: - Study limited to parasternal views only. Patient would not cooperate with the rest of the study.  Antimicrobials:  None    Objective: Vitals:   06/19/18 1821 06/19/18 2332 06/20/18 0826 06/20/18 1743  BP: (!) 131/50 (!) 154/55 (!) 176/78 133/63  Pulse: 61 71 72 67  Resp:  18 13 16   Temp: 98.4 F (36.9 C)  98 F (36.7 C) 98.2 F (36.8 C)  TempSrc:   Oral   SpO2: 100% 100% 100% 98%  Weight:      Height:        Intake/Output Summary (Last 24 hours) at 06/20/2018 1859 Last data filed at 06/20/2018 0500 Gross per 24 hour  Intake -  Output 700 ml  Net -700 ml   Filed Weights   06/15/18 1800  Weight: 87.2 kg    Examination:  Constitutional: Lethargic, alert after Narcan, interactive, Eyes: lids and conjunctivae normal ENMT: Mucous membranes are moist.  Neck: normal, supple Respiratory: clear to auscultation bilaterally, no wheezing, no crackles. Normal respiratory effort. No accessory muscle use.  Cardiovascular: Regular rate and rhythm, no murmurs / rubs / gallops. No LE edema. 2+ pedal pulses. No carotid  bruits. Abdomen: no tenderness Musculoskeletal: no clubbing / cyanosis.  Skin: no rashes Neurologic: Nonfocal   Data Reviewed: I have independently reviewed following labs and imaging studies   CBC: Recent Labs  Lab 06/16/18 0002 06/17/18 0225 06/18/18 0811 06/19/18 0350 06/20/18 0255  WBC 21.3* 19.8* 15.0* 12.1* 13.8*  NEUTROABS  --   --   --  9.4*  --   HGB 12.3 11.0* 9.5* 8.8* 8.8*  HCT 39.4 34.8* 31.1* 28.6* 29.1*  MCV 95.6 96.7 99.7 97.6 98.0  PLT 383 302 239 234 295   Basic Metabolic Panel: Recent Labs  Lab 06/16/18 1020 06/17/18 0225 06/18/18 0811 06/19/18 0350 06/20/18 0255  NA 145 142 142 140 139  K 3.6 3.2* 3.5 3.1* 3.5  CL 103 101 101 101 103  CO2 28 26 28 25 25   GLUCOSE 241* 158* 204* 169* 166*  BUN 77* 83* 85* 86* 84*  CREATININE 5.75* 5.58* 5.87* 5.70* 5.85*  CALCIUM 9.5 9.2 8.9 8.6* 8.7*  MG  --   --   --  2.0 2.1  PHOS  --   --  5.5*  --   --    GFR: Estimated Creatinine Clearance: 8.9 mL/min (A) (by C-G formula based on SCr of 5.85 mg/dL (H)). Liver Function Tests: Recent Labs  Lab 06/15/18 1958 06/16/18 1020 06/17/18 0225 06/18/18 0811 06/19/18 0350  AST 31 37 35  --  25  ALT 23 24 26   --  27  ALKPHOS 76 78 68  --  57  BILITOT 1.1 0.8 1.0  --  0.9  PROT 7.3 6.2* 5.9*  --  5.4*  ALBUMIN 3.3* 3.3* 2.9* 2.7* 2.6*   No results for input(s): LIPASE, AMYLASE in the last 168 hours. Recent Labs  Lab 06/16/18 1020  AMMONIA 16   Coagulation Profile: No results for input(s): INR, PROTIME in the last 168 hours. Cardiac Enzymes: Recent Labs  Lab 06/15/18 1958 06/16/18 0851 06/17/18 0608 06/17/18 1151  CKTOTAL  --  537*  --  503*  TROPONINI 0.33*  --  0.13*  --    BNP (last 3 results) No results for input(s): PROBNP in the last 8760 hours. HbA1C: Recent Labs    06/20/18 0255  HGBA1C 7.5*   CBG: Recent Labs  Lab 06/19/18 2325 06/20/18 0442 06/20/18 0828 06/20/18 1223 06/20/18 1728  GLUCAP 165* 151* 200* 188* 130*    Lipid Profile: Recent Labs    06/20/18 0255  CHOL 152  HDL 34*  LDLCALC 77  TRIG 207*  CHOLHDL 4.5   Thyroid Function Tests: No results for input(s): TSH, T4TOTAL, FREET4, T3FREE, THYROIDAB in the last 72 hours. Anemia Panel: No results for input(s): VITAMINB12, FOLATE, FERRITIN, TIBC, IRON, RETICCTPCT in the last 72 hours. Urine analysis:    Component Value Date/Time   COLORURINE YELLOW 06/16/2018 [place] 06/16/2018 1035   LABSPEC 1.013 06/16/2018 1035   PHURINE 6.0 06/16/2018 1035   GLUCOSEU 50 (A) 06/16/2018 1035   HGBUR MODERATE (A) 06/16/2018 1035   BILIRUBINUR NEGATIVE 06/16/2018 1035   KETONESUR NEGATIVE 06/16/2018 1035   PROTEINUR >=300 (A) 06/16/2018 1035   UROBILINOGEN 0.2 03/09/2014 1331   NITRITE NEGATIVE 06/16/2018 1035   LEUKOCYTESUR NEGATIVE 06/16/2018 1035   Sepsis Labs: Invalid input(s): PROCALCITONIN, LACTICIDVEN  Recent Results (from the past 240 hour(s))  MRSA PCR Screening     Status: None   Collection Time: 06/15/18  8:00 PM  Result Value Ref Range Status   MRSA by PCR NEGATIVE NEGATIVE Final    Comment:        The GeneXpert MRSA Assay (FDA approved for NASAL specimens only), is one component of a comprehensive MRSA colonization surveillance program. It is not intended to diagnose MRSA infection nor to guide or monitor treatment for MRSA infections. Performed at [facility], [place] [address] [place], [place] [address]       Radiology Studies: Mr Brain 41 Contrast  Result Date: 06/20/2018 CLINICAL DATA:  77 y/o F; multiple falls and progressive worsening jerking movements. Concern for seizures. Possible side effect of daily motion. EXAM: MRI HEAD WITHOUT CONTRAST TECHNIQUE: Multiplanar, multiecho pulse sequences of the brain and surrounding structures were obtained without intravenous contrast. COMPARISON:  06/19/2018 an 06/18/2018 MRI head. FINDINGS: Brain: Small foci of reduced diffusion are present in the  posterosuperior frontal lobes, right anterolateral frontal lobe, right putamen, and the right occipital lobe. Complete corpus callosum and vermis. Normal pituitary gland. No cortical dysplasia, heterotopia, or disorder of cortical formation identified. No extra-axial collection, hydrocephalus, mass effect, or herniation. Few stable nonspecific T2 FLAIR hyperintensities in subcortical and periventricular white matter are compatible with mild chronic microvascular ischemic changes for age. Mild stable volume loss of the brain. Punctate focus of susceptibility hypointensity is present in the right superomedial frontal parietal junction without signal correlate on additional sequences compatible with hemosiderin deposition of chronic microhemorrhage. Hippocampi are symmetric in size and signal  bilaterally. Vascular: Normal flow voids. Skull and upper cervical spine: Normal marrow signal. Sinuses/Orbits: Negative.  Bilateral intra-ocular lens replacement. Other: None. IMPRESSION: 1. Several small foci of reduced diffusion are present in the posterosuperior frontal lobes, right anterolateral frontal lobe, right putamen, right occipital lobe. Findings likely represent areas of acute/early subacute infarction and/or atypical for seizure related activity. Newly visible punctate lesions were likely obscured by motion artifact on the 06/18/2018 MRI of the head. 2. No structural cause of seizure identified. 3. Stable mild chronic microvascular ischemic changes and volume loss of the brain. Electronically Signed   By: Kristine Garbe M.D.   On: 06/20/2018 13:56   Mr Brain Wo Contrast  Result Date: 06/19/2018 CLINICAL DATA:  77 y/o F; multiple falls and progressive worsening jerking movements with concern for seizures. EXAM: MRI HEAD WITHOUT CONTRAST TECHNIQUE: Coronal T2 thin and axial T2 blade sequences were acquired. The patient could not hold still for the study and additional sequences were not acquired.  COMPARISON:  06/18/2018 and 04/28/2013 MRI of the head. 06/14/2018 CT head. FINDINGS: Motion degraded axial and coronal T2 weighted sequences. Small focus of T2 hyperintensity is present in the right posterosuperior frontal lobe corresponding to suspected acute/early subacute infarction on the prior MRI of the head. Several stable nonspecific T2 hyperintensities in subcortical and periventricular white matter are compatible with mild chronic microvascular ischemic changes for age. Stable volume loss of the brain. No new focal mass effect, hydrocephalus, or herniation. No abnormal T2 signal of the paranasal sinuses or mastoid air cells. Bilateral intra-ocular lens replacement. Hyperostosis frontalis interna. No acute abnormality of scalp or curve ileum is evident on T2 weighted sequences. IMPRESSION: 1. Severe motion artifact of acquired coronal and axial T2 weighted sequences. Suboptimal assessment for structural causes of seizure. Additional sequences could not be acquired. Consider repeat MRI when patient is able to hold still. 2. Small focus of hyperintensity in the right posterosuperior frontal lobe corresponding to the suspected acute/early subacute infarction on the prior MRI of the head. Electronically Signed   By: Kristine Garbe M.D.   On: 06/19/2018 22:56   Vas [place] Upper Ext Vein Mapping (pre-op Avf)  Result Date: 06/20/2018 UPPER EXTREMITY VEIN MAPPING  Indications: Pre-access. History: CKD STAGE V.  Limitations: Multiple IV on sites and patient discomfort. Comparison Study: No prior study available Performing Technologist: Hongying Cole  Examination Guidelines: A complete evaluation includes B-mode imaging, spectral Doppler, color Doppler, and power Doppler as needed of all accessible portions of each vessel. Bilateral testing is considered an integral part of a complete examination. Limited examinations for reoccurring indications may be performed as noted.  +-----------------+-------------+----------+----------+ Right Cephalic   Diameter (cm)Depth (cm) Findings  +-----------------+-------------+----------+----------+ Shoulder             0.36        1.03              +-----------------+-------------+----------+----------+ Prox upper arm       0.40        0.97              +-----------------+-------------+----------+----------+ Mid upper arm        0.42        0.83   branching  +-----------------+-------------+----------+----------+ Dist upper arm       0.43        0.53   branching  +-----------------+-------------+----------+----------+ Antecubital fossa    0.37        0.70              +-----------------+-------------+----------+----------+  Prox forearm                            IV on site +-----------------+-------------+----------+----------+ +-----------------+-------------+----------+------------------------+ Right Basilic    Diameter (cm)Depth (cm)        Findings         +-----------------+-------------+----------+------------------------+ Shoulder             0.49        1.28                            +-----------------+-------------+----------+------------------------+ Prox upper arm       0.48        1.10                            +-----------------+-------------+----------+------------------------+ Mid upper arm        0.31        1.25          branching         +-----------------+-------------+----------+------------------------+ Dist upper arm       0.33        1.16          branching         +-----------------+-------------+----------+------------------------+ Antecubital fossa    0.28        1.29          branching         +-----------------+-------------+----------+------------------------+ Prox forearm         0.21        0.73   branching and IV on site +-----------------+-------------+----------+------------------------+ +-----------------+-------------+----------+----------+  Left Cephalic    Diameter (cm)Depth (cm) Findings  +-----------------+-------------+----------+----------+ Shoulder             0.27        0.72              +-----------------+-------------+----------+----------+ Prox upper arm       0.34        0.47   branching  +-----------------+-------------+----------+----------+ Mid upper arm        0.20        0.37   branching  +-----------------+-------------+----------+----------+ Dist upper arm       0.22        0.45   branching  +-----------------+-------------+----------+----------+ Antecubital fossa                       Thrombosed +-----------------+-------------+----------+----------+ Prox forearm                            Thrombosed +-----------------+-------------+----------+----------+ +-----------------+-------------+----------+--------------+ Left Basilic     Diameter (cm)Depth (cm)   Findings    +-----------------+-------------+----------+--------------+ Shoulder             0.83        1.77                  +-----------------+-------------+----------+--------------+ Prox upper arm       0.49        1.53                  +-----------------+-------------+----------+--------------+ Mid upper arm                             Thrombosed   +-----------------+-------------+----------+--------------+ Dist upper arm  Thrombosed   +-----------------+-------------+----------+--------------+ Antecubital fossa                       unable to exam +-----------------+-------------+----------+--------------+ Prox forearm                            unable to exam +-----------------+-------------+----------+--------------+ *See table(s) above for measurements and observations.  Diagnosing physician:    Preliminary     Time spent: 40 minutes,   Author:  Berle Mull, MD [facility] Pager: [phone] 06/20/2018 6:59 PM     If 7PM-7AM, please contact  night-coverage [email or web address] Password [email or web address] 06/20/2018, 6:59 PM

## 2018-06-21 DIAGNOSIS — I639 Cerebral infarction, unspecified: Secondary | ICD-10-CM | POA: Diagnosis present

## 2018-06-21 LAB — GLUCOSE, CAPILLARY
GLUCOSE-CAPILLARY: 173 mg/dL — AB (ref 70–99)
GLUCOSE-CAPILLARY: 244 mg/dL — AB (ref 70–99)
GLUCOSE-CAPILLARY: 158 mg/dL — AB (ref 70–99)
Glucose-Capillary: 133 mg/dL — ABNORMAL HIGH (ref 70–99)
Glucose-Capillary: 138 mg/dL — ABNORMAL HIGH (ref 70–99)
Glucose-Capillary: 198 mg/dL — ABNORMAL HIGH (ref 70–99)

## 2018-06-21 LAB — BASIC METABOLIC PANEL
Anion gap: 13 (ref 5–15)
BUN: 77 mg/dL — ABNORMAL HIGH (ref 8–23)
CALCIUM: 9.1 mg/dL (ref 8.9–10.3)
CO2: 24 mmol/L (ref 22–32)
Chloride: 102 mmol/L (ref 98–111)
Creatinine, Ser: 5.71 mg/dL — ABNORMAL HIGH (ref 0.44–1.00)
GFR calc Af Amer: 8 mL/min — ABNORMAL LOW (ref >60)
GFR calc non Af Amer: 7 mL/min — ABNORMAL LOW (ref >60)
Glucose, Bld: 141 mg/dL — ABNORMAL HIGH (ref 70–99)
Potassium: 3.3 mmol/L — ABNORMAL LOW (ref 3.5–5.1)
Sodium: 139 mmol/L (ref 135–145)

## 2018-06-21 LAB — CBC
HCT: 31.2 % — ABNORMAL LOW (ref 36.0–46.0)
Hemoglobin: 9.3 g/dL — ABNORMAL LOW (ref 12.0–15.0)
MCH: 29.5 pg (ref 26.0–34.0)
MCHC: 29.8 g/dL — ABNORMAL LOW (ref 30.0–36.0)
MCV: 99 fL (ref 80.0–100.0)
Platelets: 245 10*3/uL (ref 150–400)
RBC: 3.15 MIL/uL — ABNORMAL LOW (ref 3.87–5.11)
RDW: 14.2 % (ref 11.5–15.5)
WBC: 13.7 10*3/uL — ABNORMAL HIGH (ref 4.0–10.5)
nRBC: 0 % (ref 0.0–0.2)

## 2018-06-21 MED ORDER — HYDROCODONE-ACETAMINOPHEN 5-325 MG PO TABS
1.0000 | ORAL_TABLET | Freq: Four times a day (QID) | ORAL | Status: DC | PRN
  Administered 2018-06-21 – 2018-06-25 (×13): 1 via ORAL
  Filled 2018-06-21 (×13): qty 1

## 2018-06-21 MED ORDER — CLONIDINE HCL 0.2 MG PO TABS
0.2000 mg | ORAL_TABLET | Freq: Three times a day (TID) | ORAL | Status: DC
  Administered 2018-06-21 – 2018-06-23 (×8): 0.2 mg via ORAL
  Filled 2018-06-21 (×9): qty 1

## 2018-06-21 NOTE — Progress Notes (Signed)
[facility] Progress Note    Assessment/ Plan:   1. Acute on chronic kidney disease stage V: Cr up to 5 initially on [place], initially improved to 4.6 with treatment of blood pressure and acute diastolic CHF and now in the mid 5s.  [place] with some blood and RBCs and WBCs- will order culture, pending.  CK mildly elevated at 537- recheck better at ~500.  Ordered renal US- no obstruction.  I don't think she's uremic as she's had a marked improvement in mentation with control of BP and d/c of narcs.  Will c/s VVS for AVG placement while here but don't necessarily think she needs to start yet, appreciate assistance.  I'll closely monitor mental status and signs of uremia--> overall her mentation is much improved  2.  Acute encephalopathy/ ? seizures: CT head negative, MRI showing some acute/ subacute posterosuperior frontal lobe infarcts + R anterior frontal lobe--> lots of motion artifact, trying for MRI again to eval for PRES.  Getting HIV and RPR (neg), off gabapentin on MAR.  Ammonia 16, Tylenol and salicylate levels negative--> but pt was admitted 11/30, LFTs normal.  Minimal narcs on MAR.  Possible combo of hypertensive encephalopathy and polypharmacy (see below).  Routine EEG without seizure activity.  Carotid US without obvious stenosis.  3.  Hypertensive urgency: s/p nitropaste, has transitioned to clonidine patch and hydralazine prn--> of note, inderal was started 11/27 but don't favor that medication for control of BP.  OP meds list only clonidine and Lasix.  Swtiched from nitropaste to Imdur and from clonidine patch to PO. Lasix 40 mg daily, imdur 60 mg daily, clonidine 0.2 mg TID (increased today 12/7) and follow.  4.  Acute diastolic CHF exacerbation: TTE at [facility] with concentric LVH, TTE here suboptimal study but EF 65-70% without WMA .  Lasix as above.  5.  NSTEMI: s/p hep gtt (? Demand ischemia), per primary.  6.  Polypharmacy: off gabapentin, getting too much  morhpine as OP, was reduced.  On norco now  7.  Dispo: pending  Subjective:    Repeat MRI still with poor quality images.     Objective:   BP (!) 185/76 (BP Location: Left Arm)   Pulse 82   Temp (!) 97.5 F (36.4 C) (Oral)   Resp 11   Ht 5\' 5"  (1.651 m)   Wt 87.2 kg   SpO2 99%   BMI 31.99 kg/m   Intake/Output Summary (Last 24 hours) at 06/21/2018 1136 Last data filed at 06/21/2018 0440 Gross per 24 hour  Intake 120 ml  Output 650 ml  Net -530 ml   Weight change:   Physical Exam: Gen: chronically ill-appearing, lying in bed, repeatedly complaining about pain CVS: RRR today Resp: clear bilaterally Abd: soft, nontender Ext: no LE edema Neuro: no asterixis ACCESS: LUE AVF nonpulsatile  Imaging: Mr Virgel Paling SH Contrast  Result Date: 06/20/2018 CLINICAL DATA:  77 y/o  F; stroke for follow-up. EXAM: MRA HEAD WITHOUT CONTRAST TECHNIQUE: Angiographic images of the Circle of Willis were obtained using MRA technique without intravenous contrast. COMPARISON:  06/20/2018 MRI head. FINDINGS: Severe motion artifact. Limited assessment for stenosis, small and medial vessel occlusion, aneurysm, or vascular malformation. Flow related signal is grossly seen within the internal cerebral arteries, dominant left vertebral artery, basilar artery, as well as the bilateral M1, A1, P1. IMPRESSION: Severe motion artifact, discussion above. Consider repeat MRA or CTA of head when patient is able to hold still. Electronically Signed   By: Mia Creek  Furusawa-Stratton M.D.   On: 06/20/2018 21:23   Mr Brain Wo Contrast  Result Date: 06/20/2018 CLINICAL DATA:  77 y/o F; multiple falls and progressive worsening jerking movements. Concern for seizures. Possible side effect of daily motion. EXAM: MRI HEAD WITHOUT CONTRAST TECHNIQUE: Multiplanar, multiecho pulse sequences of the brain and surrounding structures were obtained without intravenous contrast. COMPARISON:  06/19/2018 an 06/18/2018 MRI head. FINDINGS:  Brain: Small foci of reduced diffusion are present in the posterosuperior frontal lobes, right anterolateral frontal lobe, right putamen, and the right occipital lobe. Complete corpus callosum and vermis. Normal pituitary gland. No cortical dysplasia, heterotopia, or disorder of cortical formation identified. No extra-axial collection, hydrocephalus, mass effect, or herniation. Few stable nonspecific T2 FLAIR hyperintensities in subcortical and periventricular white matter are compatible with mild chronic microvascular ischemic changes for age. Mild stable volume loss of the brain. Punctate focus of susceptibility hypointensity is present in the right superomedial frontal parietal junction without signal correlate on additional sequences compatible with hemosiderin deposition of chronic microhemorrhage. Hippocampi are symmetric in size and signal bilaterally. Vascular: Normal flow voids. Skull and upper cervical spine: Normal marrow signal. Sinuses/Orbits: Negative.  Bilateral intra-ocular lens replacement. Other: None. IMPRESSION: 1. Several small foci of reduced diffusion are present in the posterosuperior frontal lobes, right anterolateral frontal lobe, right putamen, right occipital lobe. Findings likely represent areas of acute/early subacute infarction and/or atypical for seizure related activity. Newly visible punctate lesions were likely obscured by motion artifact on the 06/18/2018 MRI of the head. 2. No structural cause of seizure identified. 3. Stable mild chronic microvascular ischemic changes and volume loss of the brain. Electronically Signed   By: Kristine Garbe M.D.   On: 06/20/2018 13:56   Mr Brain Wo Contrast  Result Date: 06/19/2018 CLINICAL DATA:  77 y/o F; multiple falls and progressive worsening jerking movements with concern for seizures. EXAM: MRI HEAD WITHOUT CONTRAST TECHNIQUE: Coronal T2 thin and axial T2 blade sequences were acquired. The patient could not hold still for the  study and additional sequences were not acquired. COMPARISON:  06/18/2018 and 04/28/2013 MRI of the head. 06/14/2018 CT head. FINDINGS: Motion degraded axial and coronal T2 weighted sequences. Small focus of T2 hyperintensity is present in the right posterosuperior frontal lobe corresponding to suspected acute/early subacute infarction on the prior MRI of the head. Several stable nonspecific T2 hyperintensities in subcortical and periventricular white matter are compatible with mild chronic microvascular ischemic changes for age. Stable volume loss of the brain. No new focal mass effect, hydrocephalus, or herniation. No abnormal T2 signal of the paranasal sinuses or mastoid air cells. Bilateral intra-ocular lens replacement. Hyperostosis frontalis interna. No acute abnormality of scalp or curve ileum is evident on T2 weighted sequences. IMPRESSION: 1. Severe motion artifact of acquired coronal and axial T2 weighted sequences. Suboptimal assessment for structural causes of seizure. Additional sequences could not be acquired. Consider repeat MRI when patient is able to hold still. 2. Small focus of hyperintensity in the right posterosuperior frontal lobe corresponding to the suspected acute/early subacute infarction on the prior MRI of the head. Electronically Signed   By: Kristine Garbe M.D.   On: 06/19/2018 22:56   Vas [place] Upper Ext Vein Mapping (pre-op Avf)  Result Date: 06/20/2018 UPPER EXTREMITY VEIN MAPPING  Indications: Pre-access. History: CKD STAGE V.  Limitations: Multiple IV on sites and patient discomfort. Comparison Study: No prior study available Performing Technologist: Hongying Cole  Examination Guidelines: A complete evaluation includes B-mode imaging, spectral Doppler, color Doppler, and  power Doppler as needed of all accessible portions of each vessel. Bilateral testing is considered an integral part of a complete examination. Limited examinations for reoccurring indications may be  performed as noted. +-----------------+-------------+----------+----------+ Right Cephalic   Diameter (cm)Depth (cm) Findings  +-----------------+-------------+----------+----------+ Shoulder             0.36        1.03              +-----------------+-------------+----------+----------+ Prox upper arm       0.40        0.97              +-----------------+-------------+----------+----------+ Mid upper arm        0.42        0.83   branching  +-----------------+-------------+----------+----------+ Dist upper arm       0.43        0.53   branching  +-----------------+-------------+----------+----------+ Antecubital fossa    0.37        0.70              +-----------------+-------------+----------+----------+ Prox forearm                            IV on site +-----------------+-------------+----------+----------+ +-----------------+-------------+----------+------------------------+ Right Basilic    Diameter (cm)Depth (cm)        Findings         +-----------------+-------------+----------+------------------------+ Shoulder             0.49        1.28                            +-----------------+-------------+----------+------------------------+ Prox upper arm       0.48        1.10                            +-----------------+-------------+----------+------------------------+ Mid upper arm        0.31        1.25          branching         +-----------------+-------------+----------+------------------------+ Dist upper arm       0.33        1.16          branching         +-----------------+-------------+----------+------------------------+ Antecubital fossa    0.28        1.29          branching         +-----------------+-------------+----------+------------------------+ Prox forearm         0.21        0.73   branching and IV on site +-----------------+-------------+----------+------------------------+  +-----------------+-------------+----------+----------+ Left Cephalic    Diameter (cm)Depth (cm) Findings  +-----------------+-------------+----------+----------+ Shoulder             0.27        0.72              +-----------------+-------------+----------+----------+ Prox upper arm       0.34        0.47   branching  +-----------------+-------------+----------+----------+ Mid upper arm        0.20        0.37   branching  +-----------------+-------------+----------+----------+ Dist upper arm       0.22        0.45   branching  +-----------------+-------------+----------+----------+ Antecubital fossa  Thrombosed +-----------------+-------------+----------+----------+ Prox forearm                            Thrombosed +-----------------+-------------+----------+----------+ +-----------------+-------------+----------+--------------+ Left Basilic     Diameter (cm)Depth (cm)   Findings    +-----------------+-------------+----------+--------------+ Shoulder             0.83        1.77                  +-----------------+-------------+----------+--------------+ Prox upper arm       0.49        1.53                  +-----------------+-------------+----------+--------------+ Mid upper arm                             Thrombosed   +-----------------+-------------+----------+--------------+ Dist upper arm                            Thrombosed   +-----------------+-------------+----------+--------------+ Antecubital fossa                       unable to exam +-----------------+-------------+----------+--------------+ Prox forearm                            unable to exam +-----------------+-------------+----------+--------------+ *See table(s) above for measurements and observations.  Diagnosing physician:    Preliminary     Labs: BMET Recent Labs  Lab 06/15/18 1958 06/16/18 1020 06/17/18 0225 06/18/18 0811 06/19/18 0350  06/20/18 0255 06/21/18 0233  NA 142 145 142 142 140 139 139  K 3.7 3.6 3.2* 3.5 3.1* 3.5 3.3*  CL 100 103 101 101 101 103 102  CO2 25 28 26 28 25 25 24   GLUCOSE 198* 241* 158* 204* 169* 166* 141*  BUN 66* 77* 83* 85* 86* 84* 77*  CREATININE 4.59* 5.75* 5.58* 5.87* 5.70* 5.85* 5.71*  CALCIUM 9.4 9.5 9.2 8.9 8.6* 8.7* 9.1  PHOS  --   --   --  5.5*  --   --   --    CBC Recent Labs  Lab 06/18/18 0811 06/19/18 0350 06/20/18 0255 06/21/18 0233  WBC 15.0* 12.1* 13.8* 13.7*  NEUTROABS  --  9.4*  --   --   HGB 9.5* 8.8* 8.8* 9.3*  HCT 31.1* 28.6* 29.1* 31.2*  MCV 99.7 97.6 98.0 99.0  PLT 239 234 244 245    Medications:    . aspirin EC  81 mg Oral Daily  . cloNIDine  0.1 mg Oral BID  . donepezil  10 mg Oral QHS  . DULoxetine  60 mg Oral Daily  . furosemide  40 mg Oral Daily  . heparin injection (subcutaneous)  5,000 Units Subcutaneous Q8H  . insulin aspart  0-9 Units Subcutaneous Q4H  . isosorbide mononitrate  60 mg Oral Daily  . levothyroxine  125 mcg Oral Q0600  . mouth rinse  15 mL Mouth Rinse BID  . rosuvastatin  10 mg Oral Daily  . sodium chloride flush  3 mL Intravenous Q12H      Madelon Lips, MD 06/21/2018, 11:36 AM

## 2018-06-21 NOTE — Progress Notes (Signed)
Called by MRI shortly after change of shift for patient going for another MRI.  This RN questioned caller as patient had gone for MRI earlier today.  This RN was informed that previous was a brain MRI and that a head MRI had been ordered.  Pt given her prn premedication for MRI as shift report stated that patient is not able to tolerate MRI due to claustrophobia. Daughter called as patient was coming back to floor upset that she was not aware patient was having a repeat MRI, stating this was her fourth MRI.  Daughter states she spoke with Dr. Posey Pronto this afternoon and that he did not mention another MRI.  I advised her I would follow up with the MD in the morning.  The daughter called back and spoke with charge nurse Estill Bamberg.  Later the son called and spoke with this RN and the same message was relayed that the documentation mentions that the images were difficult to get and MRI difficult to complete, and another MRI was ordered this afternoon.  Family is requesting call from neurology tomorrow.

## 2018-06-21 NOTE — Progress Notes (Signed)
Vascular lab progress note   Carotid duplex completed 06/16/18 : no ICA stenosis.      Landry Mellow, RDMS, RVT

## 2018-06-21 NOTE — Progress Notes (Signed)
PROGRESS NOTE  Paige Arnold WIO:[id number] DOB: 1941/04/22 DOA: 06/15/2018 PCP: Angelina Sheriff, MD   LOS: 6 days   Brief Narrative / Interim history: 77 y.o.femalew/ a hx of CKD stage IV, HTN, diabetes, obesity, GERD, osteoarthritis and chronic pain who reported progressive mild jerky movements concerning for possible seizures over a few weeks, followed by a 30-minute stage of general confusion. She was seen by her Neurologist who recommended an MRI and adjusted her medications (renal dosing) but her symptoms have gotten worse in the interim. Upon presentation to the ED she was agitated and confused. She was found to have a systolic of 426. CT head was normal. Exam noted no evidence of focal neurologic deficit. Her creatinine was 4.6 with a baseline creatinine of 3.6 about a month prior. Troponin steadily rose frome 0.3 > 0.9.  Cardiology recommended medical management with heparin and nitroglycerin for NSTEMI. Given her worsening renal fxn she was transferred to [facility].   Subjective: CKD/o persistent pain and asking for more frequscy. Feeling better otherwise  Assessment & Plan: Principal Problem:   Metabolic encephalopathy Active Problems:   Diabetes mellitus type 2 with complications, uncontrolled (HCC)   Chronic kidney disease (CKD), stage IV (severe) (HCC)   Hypertensive urgency   Hypothyroidism   Acute metabolic encephalopathy   NSTEMI (non-ST elevated myocardial infarction) (HCC)   CVA (cerebral vascular accident) ([place])   Principal Problem Acute metabolic encephalopathy Likely combination of polypharmacy as well as hypertensive encephalopathy subacute CVA on MRI Also combination with possible pres to severe high blood pressure  -EEG obtained which showed generalized nonspecific cerebral dysfunction without seizure activity, very nonspecific -Likely combination of polypharmacy as well as hypertensive encephalopathy. Check MRI brain Discontinue morphine. Transition to  Norco to avoid morphine metabolites. Gradually increase Norco back to equivalent of 45 mg of morphine. Outpatient follow-up with pain management clinic Appreciate neurologic input. MRI is positive for acute stroke. Stroke team will follow-up. Neurology initiated stroke work-up. TEE and blood culture ordered.   Additional Problems Acute kidney injury on chronic kidney disease stage IV-V -Baseline creatinine in the mid threes, currently in the 5 range.  Nephrology consulted, discussed with Dr. Hollie Salk, appreciate input and defer further management to nephrology Vascular surgery consulted for graft placement. Pt cleared per neurology.   Type 2 diabetes mellitus, poorly controlled, with renal complication -Most recent A1c 7.5. -Continue sliding scale, hold home medications  Hypertensive urgency -Initial blood pressure at 220-240, status post Cardene drip utilized at [employer] -Continue clonidine patch and IV hydralazine here, she is lethargic at times and p.o. intake is not consistent  Chronic pain -Has been on morphine 3 times daily for a long time.  Her morphine is likely toxic in the setting of worsening renal function given excellent response to Narcan Received Narcan twice 06/17/2018 Daughter is at bedside and extremely vocal regarding withdrawals and she would not want her mother to go through withdrawals.   Discontinue morphine. Transition to Norco and monitor.  Recommend to avoid morphine going forward.  Elevated TSH -Continue home regimen and repeat TSH as an outpatient.  Acute diastolic CHF -Since admission at [facility] patient diuresed over 3 L of fluids, no previous heart history.  2D echo here normal LVEF.  Currently she is euvolemic  Dementia -Continue Aricept, apparently at baseline she is highly functional   Scheduled Meds: . aspirin EC  81 mg Oral Daily  . cloNIDine  0.2 mg Oral TID  . donepezil  10 mg Oral QHS  .  DULoxetine  60 mg Oral Daily  . furosemide  40 mg  Oral Daily  . heparin injection (subcutaneous)  5,000 Units Subcutaneous Q8H  . insulin aspart  0-9 Units Subcutaneous Q4H  . isosorbide mononitrate  60 mg Oral Daily  . levothyroxine  125 mcg Oral Q0600  . mouth rinse  15 mL Mouth Rinse BID  . rosuvastatin  10 mg Oral Daily  . sodium chloride flush  3 mL Intravenous Q12H   Continuous Infusions: . sodium chloride 10 mL/hr at 06/16/18 2000   PRN Meds:.sodium chloride, [DISCONTINUED] acetaminophen **OR** [DISCONTINUED] acetaminophen (TYLENOL) oral liquid 160 mg/5 mL **OR** acetaminophen, haloperidol lactate, HYDROcodone-acetaminophen, sodium chloride flush  DVT prophylaxis: heparin Code Status: Full code Family Communication: Extensive discussion with daughter at bedside Disposition Plan: Home when ready  Consultants:   Nephrology  Procedures:   2D echo Impressions: - Study limited to parasternal views only. Patient would not cooperate with the rest of the study.  Antimicrobials:  None    Objective: Vitals:   06/20/18 2218 06/21/18 0833 06/21/18 1527 06/21/18 1701  BP: 125/62 (!) 185/76 (!) 159/82 (!) 114/49  Pulse: 70 82  76  Resp: 16 11  16   Temp: 97.7 F (36.5 C) (!) 97.5 F (36.4 C)  98.1 F (36.7 C)  TempSrc: Oral Oral  Oral  SpO2:  99%  97%  Weight:      Height:        Intake/Output Summary (Last 24 hours) at 06/21/2018 1726 Last data filed at 06/21/2018 0440 Gross per 24 hour  Intake 120 ml  Output 650 ml  Net -530 ml   Filed Weights   06/15/18 1800  Weight: 87.2 kg    Examination:  Constitutional: Lethargic, alert after Narcan, interactive, Eyes: lids and conjunctivae normal ENMT: Mucous membranes are moist.  Neck: normal, supple Respiratory: clear to auscultation bilaterally, no wheezing, no crackles. Normal respiratory effort. No accessory muscle use.  Cardiovascular: Regular rate and rhythm, no murmurs / rubs / gallops. No LE edema. 2+ pedal pulses. No carotid bruits. Abdomen: no  tenderness Musculoskeletal: no clubbing / cyanosis.  Skin: no rashes Neurologic: Nonfocal   Data Reviewed: I have independently reviewed following labs and imaging studies   CBC: Recent Labs  Lab 06/17/18 0225 06/18/18 0811 06/19/18 0350 06/20/18 0255 06/21/18 0233  WBC 19.8* 15.0* 12.1* 13.8* 13.7*  NEUTROABS  --   --  9.4*  --   --   HGB 11.0* 9.5* 8.8* 8.8* 9.3*  HCT 34.8* 31.1* 28.6* 29.1* 31.2*  MCV 96.7 99.7 97.6 98.0 99.0  PLT 302 239 234 244 408   Basic Metabolic Panel: Recent Labs  Lab 06/17/18 0225 06/18/18 0811 06/19/18 0350 06/20/18 0255 06/21/18 0233  NA 142 142 140 139 139  K 3.2* 3.5 3.1* 3.5 3.3*  CL 101 101 101 103 102  CO2 26 28 25 25 24   GLUCOSE 158* 204* 169* 166* 141*  BUN 83* 85* 86* 84* 77*  CREATININE 5.58* 5.87* 5.70* 5.85* 5.71*  CALCIUM 9.2 8.9 8.6* 8.7* 9.1  MG  --   --  2.0 2.1  --   PHOS  --  5.5*  --   --   --    GFR: Estimated Creatinine Clearance: 9.1 mL/min (A) (by C-G formula based on SCr of 5.71 mg/dL (H)). Liver Function Tests: Recent Labs  Lab 06/15/18 1958 06/16/18 1020 06/17/18 0225 06/18/18 0811 06/19/18 0350  AST 31 37 35  --  25  ALT 23 24 26   --  27  ALKPHOS 76 78 68  --  57  BILITOT 1.1 0.8 1.0  --  0.9  PROT 7.3 6.2* 5.9*  --  5.4*  ALBUMIN 3.3* 3.3* 2.9* 2.7* 2.6*   No results for input(s): LIPASE, AMYLASE in the last 168 hours. Recent Labs  Lab 06/16/18 1020  AMMONIA 16   Coagulation Profile: No results for input(s): INR, PROTIME in the last 168 hours. Cardiac Enzymes: Recent Labs  Lab 06/15/18 1958 06/16/18 0851 06/17/18 0608 06/17/18 1151  CKTOTAL  --  537*  --  503*  TROPONINI 0.33*  --  0.13*  --    BNP (last 3 results) No results for input(s): PROBNP in the last 8760 hours. HbA1C: Recent Labs    06/20/18 0255  HGBA1C 7.5*   CBG: Recent Labs  Lab 06/21/18 0021 06/21/18 0357 06/21/18 0833 06/21/18 1223 06/21/18 1659  GLUCAP 133* 138* 173* 244* 198*   Lipid Profile: Recent  Labs    06/20/18 0255  CHOL 152  HDL 34*  LDLCALC 77  TRIG 207*  CHOLHDL 4.5   Thyroid Function Tests: No results for input(s): TSH, T4TOTAL, FREET4, T3FREE, THYROIDAB in the last 72 hours. Anemia Panel: No results for input(s): VITAMINB12, FOLATE, FERRITIN, TIBC, IRON, RETICCTPCT in the last 72 hours. Urine analysis:    Component Value Date/Time   COLORURINE YELLOW 06/16/2018 [place] 06/16/2018 1035   LABSPEC 1.013 06/16/2018 1035   PHURINE 6.0 06/16/2018 1035   GLUCOSEU 50 (A) 06/16/2018 1035   HGBUR MODERATE (A) 06/16/2018 1035   BILIRUBINUR NEGATIVE 06/16/2018 1035   KETONESUR NEGATIVE 06/16/2018 1035   PROTEINUR >=300 (A) 06/16/2018 1035   UROBILINOGEN 0.2 03/09/2014 1331   NITRITE NEGATIVE 06/16/2018 1035   LEUKOCYTESUR NEGATIVE 06/16/2018 1035   Sepsis Labs: Invalid input(s): PROCALCITONIN, LACTICIDVEN  Recent Results (from the past 240 hour(s))  MRSA PCR Screening     Status: None   Collection Time: 06/15/18  8:00 PM  Result Value Ref Range Status   MRSA by PCR NEGATIVE NEGATIVE Final    Comment:        The GeneXpert MRSA Assay (FDA approved for NASAL specimens only), is one component of a comprehensive MRSA colonization surveillance program. It is not intended to diagnose MRSA infection nor to guide or monitor treatment for MRSA infections. Performed at [facility], [place] [address], [place], [place] [address]   Culture, blood (routine x 2)     Status: None (Preliminary result)   Collection Time: 06/21/18 11:41 AM  Result Value Ref Range Status   Specimen Description BLOOD LEFT ANTECUBITAL  Final   Special Requests   Final    BOTTLES DRAWN AEROBIC AND ANAEROBIC Blood Culture adequate volume   Culture   Final    NO GROWTH < 12 HOURS Performed at [facility], [place] [address], [place], [place] [address]    Report Status PENDING  Incomplete  Culture, blood (routine x 2)     Status: None (Preliminary result)   Collection  Time: 06/21/18 12:11 PM  Result Value Ref Range Status   Specimen Description BLOOD BLOOD RIGHT HAND  Final   Special Requests   Final    BOTTLES DRAWN AEROBIC AND ANAEROBIC Blood Culture adequate volume   Culture   Final    NO GROWTH < 12 HOURS Performed at [facility], [place] [address] [place],  [address]    Report Status PENDING  Incomplete  Radiology Studies: Mr Virgel Paling OV Contrast  Result Date: 06/20/2018 CLINICAL DATA:  77 y/o  F; stroke for follow-up. EXAM: MRA HEAD WITHOUT CONTRAST TECHNIQUE: Angiographic images of the Circle of Willis were obtained using MRA technique without intravenous contrast. COMPARISON:  06/20/2018 MRI head. FINDINGS: Severe motion artifact. Limited assessment for stenosis, small and medial vessel occlusion, aneurysm, or vascular malformation. Flow related signal is grossly seen within the internal cerebral arteries, dominant left vertebral artery, basilar artery, as well as the bilateral M1, A1, P1. IMPRESSION: Severe motion artifact, discussion above. Consider repeat MRA or CTA of head when patient is able to hold still. Electronically Signed   By: Kristine Garbe M.D.   On: 06/20/2018 21:23   Mr Brain Wo Contrast  Result Date: 06/20/2018 CLINICAL DATA:  77 y/o F; multiple falls and progressive worsening jerking movements. Concern for seizures. Possible side effect of daily motion. EXAM: MRI HEAD WITHOUT CONTRAST TECHNIQUE: Multiplanar, multiecho pulse sequences of the brain and surrounding structures were obtained without intravenous contrast. COMPARISON:  06/19/2018 an 06/18/2018 MRI head. FINDINGS: Brain: Small foci of reduced diffusion are present in the posterosuperior frontal lobes, right anterolateral frontal lobe, right putamen, and the right occipital lobe. Complete corpus callosum and vermis. Normal pituitary gland. No cortical dysplasia, heterotopia, or disorder of cortical formation identified. No extra-axial collection,  hydrocephalus, mass effect, or herniation. Few stable nonspecific T2 FLAIR hyperintensities in subcortical and periventricular white matter are compatible with mild chronic microvascular ischemic changes for age. Mild stable volume loss of the brain. Punctate focus of susceptibility hypointensity is present in the right superomedial frontal parietal junction without signal correlate on additional sequences compatible with hemosiderin deposition of chronic microhemorrhage. Hippocampi are symmetric in size and signal bilaterally. Vascular: Normal flow voids. Skull and upper cervical spine: Normal marrow signal. Sinuses/Orbits: Negative.  Bilateral intra-ocular lens replacement. Other: None. IMPRESSION: 1. Several small foci of reduced diffusion are present in the posterosuperior frontal lobes, right anterolateral frontal lobe, right putamen, right occipital lobe. Findings likely represent areas of acute/early subacute infarction and/or atypical for seizure related activity. Newly visible punctate lesions were likely obscured by motion artifact on the 06/18/2018 MRI of the head. 2. No structural cause of seizure identified. 3. Stable mild chronic microvascular ischemic changes and volume loss of the brain. Electronically Signed   By: Kristine Garbe M.D.   On: 06/20/2018 13:56   Mr Brain Wo Contrast  Result Date: 06/19/2018 CLINICAL DATA:  78 y/o F; multiple falls and progressive worsening jerking movements with concern for seizures. EXAM: MRI HEAD WITHOUT CONTRAST TECHNIQUE: Coronal T2 thin and axial T2 blade sequences were acquired. The patient could not hold still for the study and additional sequences were not acquired. COMPARISON:  06/18/2018 and 04/28/2013 MRI of the head. 06/14/2018 CT head. FINDINGS: Motion degraded axial and coronal T2 weighted sequences. Small focus of T2 hyperintensity is present in the right posterosuperior frontal lobe corresponding to suspected acute/early subacute infarction  on the prior MRI of the head. Several stable nonspecific T2 hyperintensities in subcortical and periventricular white matter are compatible with mild chronic microvascular ischemic changes for age. Stable volume loss of the brain. No new focal mass effect, hydrocephalus, or herniation. No abnormal T2 signal of the paranasal sinuses or mastoid air cells. Bilateral intra-ocular lens replacement. Hyperostosis frontalis interna. No acute abnormality of scalp or curve ileum is evident on T2 weighted sequences. IMPRESSION: 1. Severe motion artifact of acquired coronal and axial T2 weighted sequences. Suboptimal assessment for structural  causes of seizure. Additional sequences could not be acquired. Consider repeat MRI when patient is able to hold still. 2. Small focus of hyperintensity in the right posterosuperior frontal lobe corresponding to the suspected acute/early subacute infarction on the prior MRI of the head. Electronically Signed   By: Kristine Garbe M.D.   On: 06/19/2018 22:56   Vas [place] Upper Ext Vein Mapping (pre-op Avf)  Result Date: 06/20/2018 UPPER EXTREMITY VEIN MAPPING  Indications: Pre-access. History: CKD STAGE V.  Limitations: Multiple IV on sites and patient discomfort. Comparison Study: No prior study available Performing Technologist: Hongying Cole  Examination Guidelines: A complete evaluation includes B-mode imaging, spectral Doppler, color Doppler, and power Doppler as needed of all accessible portions of each vessel. Bilateral testing is considered an integral part of a complete examination. Limited examinations for reoccurring indications may be performed as noted. +-----------------+-------------+----------+----------+ Right Cephalic   Diameter (cm)Depth (cm) Findings  +-----------------+-------------+----------+----------+ Shoulder             0.36        1.03              +-----------------+-------------+----------+----------+ Prox upper arm       0.40        0.97               +-----------------+-------------+----------+----------+ Mid upper arm        0.42        0.83   branching  +-----------------+-------------+----------+----------+ Dist upper arm       0.43        0.53   branching  +-----------------+-------------+----------+----------+ Antecubital fossa    0.37        0.70              +-----------------+-------------+----------+----------+ Prox forearm                            IV on site +-----------------+-------------+----------+----------+ +-----------------+-------------+----------+------------------------+ Right Basilic    Diameter (cm)Depth (cm)        Findings         +-----------------+-------------+----------+------------------------+ Shoulder             0.49        1.28                            +-----------------+-------------+----------+------------------------+ Prox upper arm       0.48        1.10                            +-----------------+-------------+----------+------------------------+ Mid upper arm        0.31        1.25          branching         +-----------------+-------------+----------+------------------------+ Dist upper arm       0.33        1.16          branching         +-----------------+-------------+----------+------------------------+ Antecubital fossa    0.28        1.29          branching         +-----------------+-------------+----------+------------------------+ Prox forearm         0.21        0.73   branching and IV on site +-----------------+-------------+----------+------------------------+ +-----------------+-------------+----------+----------+ Left Cephalic    Diameter (  cm)Depth (cm) Findings  +-----------------+-------------+----------+----------+ Shoulder             0.27        0.72              +-----------------+-------------+----------+----------+ Prox upper arm       0.34        0.47   branching   +-----------------+-------------+----------+----------+ Mid upper arm        0.20        0.37   branching  +-----------------+-------------+----------+----------+ Dist upper arm       0.22        0.45   branching  +-----------------+-------------+----------+----------+ Antecubital fossa                       Thrombosed +-----------------+-------------+----------+----------+ Prox forearm                            Thrombosed +-----------------+-------------+----------+----------+ +-----------------+-------------+----------+--------------+ Left Basilic     Diameter (cm)Depth (cm)   Findings    +-----------------+-------------+----------+--------------+ Shoulder             0.83        1.77                  +-----------------+-------------+----------+--------------+ Prox upper arm       0.49        1.53                  +-----------------+-------------+----------+--------------+ Mid upper arm                             Thrombosed   +-----------------+-------------+----------+--------------+ Dist upper arm                            Thrombosed   +-----------------+-------------+----------+--------------+ Antecubital fossa                       unable to exam +-----------------+-------------+----------+--------------+ Prox forearm                            unable to exam +-----------------+-------------+----------+--------------+ *See table(s) above for measurements and observations.  Diagnosing physician:    Preliminary     Time spent: 40 minutes,   Author:  Berle Mull, MD [facility] Pager: [phone] 06/21/2018 5:26 PM     If 7PM-7AM, please contact night-coverage [email or web address] Password [facility] 06/21/2018, 5:26 PM

## 2018-06-21 NOTE — Progress Notes (Signed)
STROKE TEAM PROGRESS NOTE   HISTORY OF PRESENT ILLNESS (per record) Paige Arnold is a 77 y.o. female who has a past medical history of stage IV kidney disease, hypertension, diabetes, morbid obesity, gastroesophageal reflux disease, chronic pain who was brought in to [facility] for evaluation of multiple falls and progressively worsening jerking movement concerning for seizures that have been ongoing for the past few weeks to couple months. She sees Dr. Narda Amber in the neurology clinic outpatient. According to her husband, she had been having progressive course shaking movements along with multiple falls and difficulty walking over the past few weeks to months and had become more confused prompting the trip to the hospital over at [place].  There she had severely elevated blood pressures which required IV nicardipine. Her renal function also started to deteriorate and she was transferred to [facility] for higher level of care. She continued to remain encephalopathic and an MRI was obtained for work-up completion.  She could not complete the MRI due to claustrophobia.  DWI sequences showed possible subacute strokes bilaterally.  Neurological consultation was obtained for evaluation of the strokes and further recommendations. Reviewing Dr. Serita Grit last note from neurology, she had been complaining, at the end of November, about tremors, generalized shaking, headaches and whole body pain.  According to her daughter she had been falling almost every day without any warning.  Family felt that her increased shakes and jerks were the cause of her instability.  She denied any lightheadedness at the time.  She had some bruises on her hand at the time.   Patient's husband seemed to be somewhat of a poor historian. She lives with her daughter, who is not available at this time  LKW:4-8 weeks ago tpa given?: no, OSW Premorbid modified Rankin scale (mRS): 3   SUBJECTIVE (INTERVAL HISTORY) The  patient's husband was at the bedside.  The patient is now oriented and able to follow commands.  She continues to improve.  Apparently she is scheduled to have surgery next week for dialysis graft placement. The patient's main complaint at this time is back pain.  She has a history of chronic back pain according to her husband.    OBJECTIVE Vitals:   06/20/18 1743 06/20/18 2218 06/21/18 0833 06/21/18 1527  BP: 133/63 125/62 (!) 185/76 (!) 159/82  Pulse: 67 70 82   Resp: 16 16 11    Temp: 98.2 F (36.8 C) 97.7 F (36.5 C) (!) 97.5 F (36.4 C)   TempSrc:  Oral Oral   SpO2: 98%  99%   Weight:      Height:        CBC:  Recent Labs  Lab 06/19/18 0350 06/20/18 0255 06/21/18 0233  WBC 12.1* 13.8* 13.7*  NEUTROABS 9.4*  --   --   HGB 8.8* 8.8* 9.3*  HCT 28.6* 29.1* 31.2*  MCV 97.6 98.0 99.0  PLT 234 244 756    Basic Metabolic Panel:  Recent Labs  Lab 06/18/18 0811 06/19/18 0350 06/20/18 0255 06/21/18 0233  NA 142 140 139 139  K 3.5 3.1* 3.5 3.3*  CL 101 101 103 102  CO2 28 25 25 24   GLUCOSE 204* 169* 166* 141*  BUN 85* 86* 84* 77*  CREATININE 5.87* 5.70* 5.85* 5.71*  CALCIUM 8.9 8.6* 8.7* 9.1  MG  --  2.0 2.1  --   PHOS 5.5*  --   --   --     Lipid Panel:     Component Value Date/Time  CHOL 152 06/20/2018 0255   TRIG 207 (H) 06/20/2018 0255   HDL 34 (L) 06/20/2018 0255   CHOLHDL 4.5 06/20/2018 0255   VLDL 41 (H) 06/20/2018 0255   LDLCALC 77 06/20/2018 0255   HgbA1c:  Lab Results  Component Value Date   HGBA1C 7.5 (H) 06/20/2018   Urine Drug Screen:     Component Value Date/Time   LABOPIA PPS 05/13/2015 1211   COCAINSCRNUR NEG 05/13/2015 1211   LABBENZ NEG 05/13/2015 1211   AMPHETMU NEG 05/13/2015 1211   THCU NEG 05/13/2015 1211   LABBARB NEG 05/13/2015 1211    Alcohol Level No results found for: [facility]  IMAGING   Mr Jodene Nam Head Wo Contrast 06/20/2018 IMPRESSION:  Severe motion artifact, discussion above. Consider repeat MRA or CTA of head when  patient is able to hold still.    Mr Brain Wo Contrast 06/20/2018 IMPRESSION:  1. Several small foci of reduced diffusion are present in the posterosuperior frontal lobes, right anterolateral frontal lobe, right putamen, right occipital lobe. Findings likely represent areas of acute/early subacute infarction and/or atypical for seizure related activity. Newly visible punctate lesions were likely obscured by motion artifact on the 06/18/2018 MRI of the head.  2. No structural cause of seizure identified.  3. Stable mild chronic microvascular ischemic changes and volume loss of the brain.     Mr Brain Wo Contrast 06/19/2018 IMPRESSION:  1. Severe motion artifact of acquired coronal and axial T2 weighted sequences. Suboptimal assessment for structural causes of seizure. Additional sequences could not be acquired. Consider repeat MRI when patient is able to hold still.  2. Small focus of hyperintensity in the right posterosuperior frontal lobe corresponding to the suspected acute/early subacute infarction on the prior MRI of the head.    Vas [place] Upper Ext Vein Mapping (pre-op Avf) 06/20/2018 UPPER EXTREMITY VEIN MAPPING  Indications: Pre-access. History: CKD STAGE V.  Limitations: Multiple IV on sites and patient discomfort. Comparison Study: No prior study available Performing Technologist: Hongying Cole  Examination Guidelines: A complete evaluation includes B-mode imaging, spectral Doppler, color Doppler, and power Doppler as needed of all accessible portions of each vessel. Bilateral testing is considered an integral part of a complete examination. Limited examinations for reoccurring indications may be performed as noted. +-----------------+-------------+----------+----------+ Right Cephalic   Diameter (cm)Depth (cm) Findings  +-----------------+-------------+----------+----------+ Shoulder             0.36        1.03              +-----------------+-------------+----------+----------+  Prox upper arm       0.40        0.97              +-----------------+-------------+----------+----------+ Mid upper arm        0.42        0.83   branching  +-----------------+-------------+----------+----------+ Dist upper arm       0.43        0.53   branching  +-----------------+-------------+----------+----------+ Antecubital fossa    0.37        0.70              +-----------------+-------------+----------+----------+ Prox forearm                            IV on site +-----------------+-------------+----------+----------+ +-----------------+-------------+----------+------------------------+ Right Basilic    Diameter (cm)Depth (cm)        Findings         +-----------------+-------------+----------+------------------------+  Shoulder             0.49        1.28                            +-----------------+-------------+----------+------------------------+ Prox upper arm       0.48        1.10                            +-----------------+-------------+----------+------------------------+ Mid upper arm        0.31        1.25          branching         +-----------------+-------------+----------+------------------------+ Dist upper arm       0.33        1.16          branching         +-----------------+-------------+----------+------------------------+ Antecubital fossa    0.28        1.29          branching         +-----------------+-------------+----------+------------------------+ Prox forearm         0.21        0.73   branching and IV on site +-----------------+-------------+----------+------------------------+ +-----------------+-------------+----------+----------+ Left Cephalic    Diameter (cm)Depth (cm) Findings  +-----------------+-------------+----------+----------+ Shoulder             0.27        0.72              +-----------------+-------------+----------+----------+ Prox upper arm       0.34        0.47   branching   +-----------------+-------------+----------+----------+ Mid upper arm        0.20        0.37   branching  +-----------------+-------------+----------+----------+ Dist upper arm       0.22        0.45   branching  +-----------------+-------------+----------+----------+ Antecubital fossa                       Thrombosed +-----------------+-------------+----------+----------+ Prox forearm                            Thrombosed +-----------------+-------------+----------+----------+ +-----------------+-------------+----------+--------------+ Left Basilic     Diameter (cm)Depth (cm)   Findings    +-----------------+-------------+----------+--------------+ Shoulder             0.83        1.77                  +-----------------+-------------+----------+--------------+ Prox upper arm       0.49        1.53                  +-----------------+-------------+----------+--------------+ Mid upper arm                             Thrombosed   +-----------------+-------------+----------+--------------+ Dist upper arm                            Thrombosed   +-----------------+-------------+----------+--------------+ Antecubital fossa                       unable to exam +-----------------+-------------+----------+--------------+ Prox forearm  unable to exam +-----------------+-------------+----------+--------------+ *See table(s) above for measurements and observations.  Diagnosing physician:    Preliminary      Transthoracic Echocardiogram  06/16/2018 Study Conclusions - Left ventricle: The cavity size was normal. Wall thickness was   normal. Systolic function was vigorous. The estimated ejection   fraction was in the range of 65% to 70%. Wall motion was normal;   there were no regional wall motion abnormalities. The study is   not technically sufficient to allow evaluation of LV diastolic   function. Impressions: - Study limited to  parasternal views only. Patient would not   cooperate with the rest of the study.   Bilateral Carotid Dopplers  06/17/2018 Summary: Right Carotid: There is no obvious evidence of stenosis in the right ICA. Left Carotid: There is no obvious evidence of stenosis in the left ICA Vertebrals: Bilateral vertebral arteries demonstrate antegrade flow. Subclavians: Normal flow hemodynamics were seen in bilateral subclavian       arteries.  EEG 06/16/2018 EEG Abnormalities:  1) triphasic waves 2) generalized irregular slow activity  Clinical Interpretation: This EEG is consistent with a mild generalized nonspecific cerebral dysfunction (encephalopathy). There was no seizure or seizure predisposition recorded on this study. Please note that lack of epileptiform activity on EEG does not preclude the possibility of epilepsy.      PHYSICAL EXAM Blood pressure (!) 159/82, pulse 82, temperature (!) 97.5 F (36.4 C), temperature source Oral, resp. rate 11, height 5\' 5"  (1.651 m), weight 87.2 kg, SpO2 99 %.  Pleasant obese elderly Caucasian lady not in distress. . Afebrile. Head is nontraumatic. Neck is supple without bruit.    Cardiac exam no murmur or gallop. Lungs are clear to auscultation. Distal pulses are well felt.  Neurological Exam ;  Awake  Alert oriented x 3. Slow hehsitant speech but no aphasia.Diminished attention, registration and recall..eye movements full without nystagmus.fundi were not visualized. Vision acuity and fields appear normal. Hearing is normal. Palatal movements are normal. Face symmetric. Tongue midline. Normal strength, tone, reflexes and coordination. except mild weakness left hand muscles.asterixis is present bilaterally. Normal sensation. Gait deferred.       ASSESSMENT/PLAN Paige Arnold is a 77 y.o. female with history of stage IV kidney disease, hypertension, diabetes, morbid obesity, gastroesophageal reflux disease, and chronic pain presenting  with multiple falls and progressively worsening jerking movements concerning for seizures. She did not receive IV t-PA due to  late presentation.   Stroke: multiple infarcts c/w emboli from an unknown source.  Resultant  Encephalopathy and mild left sided weakness  CT head - not performed  MRI head - Several small foci of reduced diffusion are present in the posterosuperior frontal lobes, right anterolateral frontal lobe, right putamen, right occipital lobe.  MRA head - Severe motion artifact,  CTA H&N  - not ordered  Carotid Doppler - unremarkable  2D Echo  - EF 65 - 70%. Limited study due to pt motion  EEG - c/w encephalopathy  LDL - 77  HgbA1c - 7.5  UDS - negative (except opiates PPS)  VTE prophylaxis - [place] Heparin  Diet - - Heart healthy / carb modified with thin liquids.  No antithrombotic prior to admission, now on aspirin 81 mg daily  Patient counseled to be compliant with her antithrombotic medications  Ongoing aggressive stroke risk factor management  Therapy recommendations:  pending  Disposition:  Pending  Hypertension  Stable . Permissive hypertension (OK if < 220/120) but gradually normalize in 5-7 days .  Long-term BP goal normotensive  Hyperlipidemia  Lipid lowering medication PTA:  Crestor 10 mg daily  LDL 77, goal < 70  Current lipid lowering medication: Crestor 10 mg daily  Continue statin at discharge  Diabetes  HgbA1c 7.5, goal < 7.0  Uncontrolled  Other Stroke Risk Factors  Advanced age  Obesity, Body mass index is 31.99 kg/m., recommend weight loss, diet and exercise as appropriate   Obstructive sleep apnea, not on CPAP at home  Other Active Problems  Encephalopathy - improving  Leukocytosis  Anemia  Hypokalemia  ESRD  Plan  TEE Monday - will notify Trish from Cardiology and make pt NPO for Monday    Hospital day # [place] PA-C [facility] Pager [phone] 06/21/2018, 4:30  PM I have personally obtained history,examined this patient, reviewed notes, independently viewed imaging studies, participated in medical decision making and plan of care.ROS completed by me personally and pertinent positives fully documented  I have made any additions or clarifications directly to the above note. Agree with note above.   She presented with altered mental status and diffuse weakness in the setting of encephalopathy from worsening renal failure and hypertensive urgency.  MRI scan of the brain does show multiple small right brain infarcts likely of small vessel disease etiology in the setting of hypertensive urgency.  Recommend strict blood pressure control and aggressive risk factor modification.  Continue aspirin.  Long discussion with the patient's husband at the bedside and with Dr. Hollie Salk nephrologist and Dr. Posey Pronto.  Greater than 50% time during this 35-minute visit was spent on counseling and coordination of care about her strokes and answering questions. Antony Contras, MD Medical Director [facility] Pager: [phone] 06/21/2018 4:53 PM   To contact Stroke Continuity provider, please refer to [email or web address] After hours, contact General Neurology

## 2018-06-22 ENCOUNTER — Other Ambulatory Visit (HOSPITAL_COMMUNITY): Payer: Self-pay

## 2018-06-22 LAB — CBC
HCT: 30.5 % — ABNORMAL LOW (ref 36.0–46.0)
Hemoglobin: 9.5 g/dL — ABNORMAL LOW (ref 12.0–15.0)
MCH: 30.5 pg (ref 26.0–34.0)
MCHC: 31.1 g/dL (ref 30.0–36.0)
MCV: 98.1 fL (ref 80.0–100.0)
Platelets: 236 10*3/uL (ref 150–400)
RBC: 3.11 MIL/uL — ABNORMAL LOW (ref 3.87–5.11)
RDW: 14.3 % (ref 11.5–15.5)
WBC: 14.7 10*3/uL — AB (ref 4.0–10.5)
nRBC: 0 % (ref 0.0–0.2)

## 2018-06-22 LAB — BASIC METABOLIC PANEL
Anion gap: 15 (ref 5–15)
BUN: 82 mg/dL — ABNORMAL HIGH (ref 8–23)
CO2: 22 mmol/L (ref 22–32)
Calcium: 9 mg/dL (ref 8.9–10.3)
Chloride: 101 mmol/L (ref 98–111)
Creatinine, Ser: 5.69 mg/dL — ABNORMAL HIGH (ref 0.44–1.00)
GFR calc Af Amer: 8 mL/min — ABNORMAL LOW (ref >60)
GFR calc non Af Amer: 7 mL/min — ABNORMAL LOW (ref >60)
GLUCOSE: 168 mg/dL — AB (ref 70–99)
Potassium: 4.1 mmol/L (ref 3.5–5.1)
Sodium: 138 mmol/L (ref 135–145)

## 2018-06-22 LAB — GLUCOSE, CAPILLARY
GLUCOSE-CAPILLARY: 162 mg/dL — AB (ref 70–99)
Glucose-Capillary: 151 mg/dL — ABNORMAL HIGH (ref 70–99)
Glucose-Capillary: 158 mg/dL — ABNORMAL HIGH (ref 70–99)
Glucose-Capillary: 225 mg/dL — ABNORMAL HIGH (ref 70–99)
Glucose-Capillary: 307 mg/dL — ABNORMAL HIGH (ref 70–99)

## 2018-06-22 MED ORDER — INSULIN ASPART 100 UNIT/ML ~~LOC~~ SOLN
0.0000 [IU] | Freq: Three times a day (TID) | SUBCUTANEOUS | Status: DC
  Administered 2018-06-22 – 2018-06-23 (×5): 3 [IU] via SUBCUTANEOUS
  Administered 2018-06-24: 1 [IU] via SUBCUTANEOUS
  Administered 2018-06-24 – 2018-06-25 (×2): 2 [IU] via SUBCUTANEOUS
  Administered 2018-06-25: 1 [IU] via SUBCUTANEOUS
  Administered 2018-06-25: 2 [IU] via SUBCUTANEOUS

## 2018-06-22 MED ORDER — ACETAMINOPHEN 325 MG PO TABS
325.0000 mg | ORAL_TABLET | ORAL | Status: DC | PRN
  Administered 2018-06-22 – 2018-06-25 (×11): 325 mg via ORAL
  Filled 2018-06-22 (×12): qty 1

## 2018-06-22 MED ORDER — INSULIN ASPART 100 UNIT/ML ~~LOC~~ SOLN
0.0000 [IU] | Freq: Every day | SUBCUTANEOUS | Status: DC
  Administered 2018-06-22: 4 [IU] via SUBCUTANEOUS

## 2018-06-22 MED ORDER — GABAPENTIN 100 MG PO CAPS
100.0000 mg | ORAL_CAPSULE | Freq: Three times a day (TID) | ORAL | Status: DC
  Administered 2018-06-22 – 2018-06-23 (×5): 100 mg via ORAL
  Filled 2018-06-22 (×5): qty 1

## 2018-06-22 NOTE — Progress Notes (Signed)
PROGRESS NOTE  Paige Arnold HLK:[id number] DOB: 09-14-1940 DOA: 06/15/2018 PCP: Angelina Sheriff, MD   LOS: 7 days   Brief Narrative / Interim history: 77 y.o.femalew/ a hx of CKD stage IV, HTN, diabetes, obesity, GERD, osteoarthritis and chronic pain who reported progressive mild jerky movements concerning for possible seizures over a few weeks, followed by a 30-minute stage of general confusion. She was seen by her Neurologist who recommended an MRI and adjusted her medications (renal dosing) but her symptoms have gotten worse in the interim. Upon presentation to the ED she was agitated and confused. She was found to have a systolic of 734. CT head was normal. Exam noted no evidence of focal neurologic deficit. Her creatinine was 4.6 with a baseline creatinine of 3.6 about a month prior. Troponin steadily rose frome 0.3 > 0.9.  Cardiology recommended medical management with heparin and nitroglycerin for NSTEMI. Given her worsening renal fxn she was transferred to [facility].   Subjective: Continues to report severe pain while having normal blood pressure and heart rate. Also reports pain despite taking pain medication without any improvement.  Assessment & Plan: Principal Problem:   Metabolic encephalopathy Active Problems:   Diabetes mellitus type 2 with complications, uncontrolled (HCC)   Chronic kidney disease (CKD), stage IV (severe) (HCC)   Hypertensive urgency   Hypothyroidism   Acute metabolic encephalopathy   NSTEMI (non-ST elevated myocardial infarction) (HCC)   CVA (cerebral vascular accident) ([place])   Principal Problem Acute metabolic encephalopathy Likely combination of polypharmacy as well as hypertensive encephalopathy subacute CVA on MRI Also combination with possible pres to severe high blood pressure  -EEG obtained which showed generalized nonspecific cerebral dysfunction without seizure activity, very nonspecific -Likely combination of polypharmacy as well as  hypertensive encephalopathy. Check MRI brain Discontinue morphine. Transition to Norco to avoid morphine metabolites. Gradually increase Norco back to equivalent of 45 mg of morphine. Outpatient follow-up with pain management clinic Appreciate neurologic input. MRI is positive for acute stroke. Stroke team was consulted, currently recommend aspirin. Initial plan was to perform a TEE although currently they feel that this is all small vessel disease and therefore would not recommend any further work-up. Blood cultures were ordered which are currently negative.  Additional Problems Acute kidney injury on chronic kidney disease stage IV-V -Baseline creatinine in the mid threes, currently in the 5 range.  Nephrology consulted, discussed with Dr. Hollie Salk, appreciate input and defer further management to nephrology Vascular surgery consulted for graft placement. Pt cleared per neurology.   Type 2 diabetes mellitus, poorly controlled, with renal complication -Most recent A1c 7.5. -Continue sliding scale, hold home medications  Hypertensive urgency -Initial blood pressure at 220-240, status post Cardene drip utilized at [employer] -Continue clonidine patch and IV hydralazine here, she is lethargic at times and p.o. intake is not consistent  Chronic pain -Has been on morphine 3 times daily for a long time.  Her morphine is likely toxic in the setting of worsening renal function given excellent response to Narcan Received Narcan twice 06/17/2018 Daughter is at bedside and extremely vocal regarding withdrawals and she would not want her mother to go through withdrawals.   Discontinue morphine. Transition to Norco and monitor.  Recommend to avoid morphine going forward. Discussed with daughter, would not increase the narcotics any further than every 6 hours of Norco. We will add gabapentin and monitor.  Elevated TSH -Continue home regimen and repeat TSH as an outpatient.  Acute diastolic  CHF -Since admission at [facility]  patient diuresed over 3 L of fluids, no previous heart history.  2D echo here normal LVEF.  Currently she is euvolemic  Dementia -Continue Aricept, apparently at baseline she is highly functional   Scheduled Meds: . aspirin EC  81 mg Oral Daily  . cloNIDine  0.2 mg Oral TID  . donepezil  10 mg Oral QHS  . DULoxetine  60 mg Oral Daily  . furosemide  40 mg Oral Daily  . gabapentin  100 mg Oral TID  . heparin injection (subcutaneous)  5,000 Units Subcutaneous Q8H  . insulin aspart  0-5 Units Subcutaneous QHS  . insulin aspart  0-9 Units Subcutaneous TID WC  . isosorbide mononitrate  60 mg Oral Daily  . levothyroxine  125 mcg Oral Q0600  . mouth rinse  15 mL Mouth Rinse BID  . rosuvastatin  10 mg Oral Daily  . sodium chloride flush  3 mL Intravenous Q12H   Continuous Infusions: . sodium chloride 10 mL/hr at 06/16/18 2000   PRN Meds:.sodium chloride, acetaminophen, haloperidol lactate, HYDROcodone-acetaminophen, sodium chloride flush  DVT prophylaxis: heparin Code Status: Full code Family Communication: Extensive discussion with daughter at bedside Disposition Plan: Home when ready  Consultants:   Nephrology  Procedures:   2D echo Impressions: - Study limited to parasternal views only. Patient would not cooperate with the rest of the study.  Antimicrobials:  None    Objective: Vitals:   06/21/18 2107 06/21/18 2238 06/22/18 0934 06/22/18 1646  BP: 139/75 (!) 90/46 (!) 148/69 136/68  Pulse:  65  74  Resp:  18 18 20   Temp:  97.9 F (36.6 C) 98 F (36.7 C) 98 F (36.7 C)  TempSrc:  Oral Oral Oral  SpO2:  96% 98% 99%  Weight:      Height:        Intake/Output Summary (Last 24 hours) at 06/22/2018 1811 Last data filed at 06/22/2018 0900 Gross per 24 hour  Intake 120 ml  Output 500 ml  Net -380 ml   Filed Weights   06/15/18 1800  Weight: 87.2 kg    Examination:  Constitutional: Lethargic, alert after Narcan,  interactive, Eyes: lids and conjunctivae normal ENMT: Mucous membranes are moist.  Neck: normal, supple Respiratory: clear to auscultation bilaterally, no wheezing, no crackles. Normal respiratory effort. No accessory muscle use.  Cardiovascular: Regular rate and rhythm, no murmurs / rubs / gallops. No LE edema. 2+ pedal pulses. No carotid bruits. Abdomen: no tenderness Musculoskeletal: no clubbing / cyanosis.  Skin: no rashes Neurologic: Nonfocal   Data Reviewed: I have independently reviewed following labs and imaging studies   CBC: Recent Labs  Lab 06/18/18 0811 06/19/18 0350 06/20/18 0255 06/21/18 0233 06/22/18 0305  WBC 15.0* 12.1* 13.8* 13.7* 14.7*  NEUTROABS  --  9.4*  --   --   --   HGB 9.5* 8.8* 8.8* 9.3* 9.5*  HCT 31.1* 28.6* 29.1* 31.2* 30.5*  MCV 99.7 97.6 98.0 99.0 98.1  PLT 239 234 244 245 017   Basic Metabolic Panel: Recent Labs  Lab 06/18/18 0811 06/19/18 0350 06/20/18 0255 06/21/18 0233 06/22/18 0305  NA 142 140 139 139 138  K 3.5 3.1* 3.5 3.3* 4.1  CL 101 101 103 102 101  CO2 28 25 25 24 22   GLUCOSE 204* 169* 166* 141* 168*  BUN 85* 86* 84* 77* 82*  CREATININE 5.87* 5.70* 5.85* 5.71* 5.69*  CALCIUM 8.9 8.6* 8.7* 9.1 9.0  MG  --  2.0 2.1  --   --  PHOS 5.5*  --   --   --   --    GFR: Estimated Creatinine Clearance: 9.2 mL/min (A) (by C-G formula based on SCr of 5.69 mg/dL (H)). Liver Function Tests: Recent Labs  Lab 06/15/18 1958 06/16/18 1020 06/17/18 0225 06/18/18 0811 06/19/18 0350  AST 31 37 35  --  25  ALT 23 24 26   --  27  ALKPHOS 76 78 68  --  57  BILITOT 1.1 0.8 1.0  --  0.9  PROT 7.3 6.2* 5.9*  --  5.4*  ALBUMIN 3.3* 3.3* 2.9* 2.7* 2.6*   No results for input(s): LIPASE, AMYLASE in the last 168 hours. Recent Labs  Lab 06/16/18 1020  AMMONIA 16   Coagulation Profile: No results for input(s): INR, PROTIME in the last 168 hours. Cardiac Enzymes: Recent Labs  Lab 06/15/18 1958 06/16/18 0851 06/17/18 0608  06/17/18 1151  CKTOTAL  --  537*  --  503*  TROPONINI 0.33*  --  0.13*  --    BNP (last 3 results) No results for input(s): PROBNP in the last 8760 hours. HbA1C: Recent Labs    06/20/18 0255  HGBA1C 7.5*   CBG: Recent Labs  Lab 06/21/18 1941 06/22/18 0023 06/22/18 0722 06/22/18 0848 06/22/18 1224  GLUCAP 158* 158* 151* 162* 225*   Lipid Profile: Recent Labs    06/20/18 0255  CHOL 152  HDL 34*  LDLCALC 77  TRIG 207*  CHOLHDL 4.5   Thyroid Function Tests: No results for input(s): TSH, T4TOTAL, FREET4, T3FREE, THYROIDAB in the last 72 hours. Anemia Panel: No results for input(s): VITAMINB12, FOLATE, FERRITIN, TIBC, IRON, RETICCTPCT in the last 72 hours. Urine analysis:    Component Value Date/Time   COLORURINE YELLOW 06/16/2018 [place] 06/16/2018 1035   LABSPEC 1.013 06/16/2018 1035   PHURINE 6.0 06/16/2018 1035   GLUCOSEU 50 (A) 06/16/2018 1035   HGBUR MODERATE (A) 06/16/2018 1035   BILIRUBINUR NEGATIVE 06/16/2018 1035   KETONESUR NEGATIVE 06/16/2018 1035   PROTEINUR >=300 (A) 06/16/2018 1035   UROBILINOGEN 0.2 03/09/2014 1331   NITRITE NEGATIVE 06/16/2018 1035   LEUKOCYTESUR NEGATIVE 06/16/2018 1035   Sepsis Labs: Invalid input(s): PROCALCITONIN, LACTICIDVEN  Recent Results (from the past 240 hour(s))  MRSA PCR Screening     Status: None   Collection Time: 06/15/18  8:00 PM  Result Value Ref Range Status   MRSA by PCR NEGATIVE NEGATIVE Final    Comment:        The GeneXpert MRSA Assay (FDA approved for NASAL specimens only), is one component of a comprehensive MRSA colonization surveillance program. It is not intended to diagnose MRSA infection nor to guide or monitor treatment for MRSA infections. Performed at [facility], [place] [address], [place], [place] [address]   Culture, Urine     Status: Abnormal (Preliminary result)   Collection Time: 06/21/18  5:00 AM  Result Value Ref Range Status   Specimen Description  URINE, CATHETERIZED  Final   Special Requests   Final    NONE Performed at [facility], [place] [address] [place], [place] [address]    Culture >=100,000 COLONIES/mL ENTEROCOCCUS FAECALIS (A)  Final   Report Status PENDING  Incomplete  Culture, blood (routine x 2)     Status: None (Preliminary result)   Collection Time: 06/21/18 11:41 AM  Result Value Ref Range Status   Specimen Description BLOOD LEFT ANTECUBITAL  Final   Special Requests   Final  BOTTLES DRAWN AEROBIC AND ANAEROBIC Blood Culture adequate volume   Culture   Final    NO GROWTH 1 DAY Performed at [facility], [place] [address] [place], [place] [address]    Report Status PENDING  Incomplete  Culture, blood (routine x 2)     Status: None (Preliminary result)   Collection Time: 06/21/18 12:11 PM  Result Value Ref Range Status   Specimen Description BLOOD BLOOD RIGHT HAND  Final   Special Requests   Final    BOTTLES DRAWN AEROBIC AND ANAEROBIC Blood Culture adequate volume   Culture   Final    NO GROWTH 1 DAY Performed at [facility], [place] [address] [place], [place] [address]    Report Status PENDING  Incomplete      Radiology Studies: Mr Virgel Paling ZX Contrast  Result Date: 06/20/2018 CLINICAL DATA:  77 y/o  F; stroke for follow-up. EXAM: MRA HEAD WITHOUT CONTRAST TECHNIQUE: Angiographic images of the Circle of Willis were obtained using MRA technique without intravenous contrast. COMPARISON:  06/20/2018 MRI head. FINDINGS: Severe motion artifact. Limited assessment for stenosis, small and medial vessel occlusion, aneurysm, or vascular malformation. Flow related signal is grossly seen within the internal cerebral arteries, dominant left vertebral artery, basilar artery, as well as the bilateral M1, A1, P1. IMPRESSION: Severe motion artifact, discussion above. Consider repeat MRA or CTA of head when patient is able to hold still. Electronically Signed   By: Kristine Garbe M.D.   On: 06/20/2018  21:23    Time spent: 40 minutes,   Author:  Berle Mull, MD [facility] Pager: [phone] 06/22/2018 6:11 PM     If 7PM-7AM, please contact night-coverage [email or web address] Password [facility] 06/22/2018, 6:11 PM

## 2018-06-22 NOTE — Progress Notes (Signed)
Dialysis access procedure scheduled for Tuesday  [facility]

## 2018-06-22 NOTE — Progress Notes (Addendum)
[facility] Progress Note    Assessment/ Plan:   1. Acute on chronic kidney disease stage V: Cr up to 5 initially on [place], initially improved to 4.6 with treatment of blood pressure and acute diastolic CHF and now in the mid 5s.  [place] with some blood and RBCs and WBCs- will order culture, pending.  CK mildly elevated at 537- recheck better at ~500.  Ordered renal US- no obstruction.  I don't think she's uremic as she's had a marked improvement in mentation with control of BP and d/c of narcs.  VVS consulted for AVG placement while here but don't necessarily think she needs to start yet, appreciate assistance.  OR time posted for 12/10. I'll closely monitor mental status and signs of uremia--> overall her mentation is much improved.  2.  Acute encephalopathy/ ? Seizures/ acute stroke: CT head negative, MRI showing some acute/ subacute posterosuperior frontal lobe infarcts + R anterior frontal lobe--> lots of motion artifact.   Possible combo of hypertensive encephalopathy and polypharmacy (see below).  Routine EEG without seizure activity.  Carotid US without obvious stenosis.  Stroke team was following, now signed off.    3.  Hypertensive urgency: s/p nitropaste, has transitioned to clonidine patch and hydralazine prn--> of note, inderal was started 11/27 but don't favor that medication for control of BP.  OP meds list only clonidine and Lasix.  Swtiched from nitropaste to Imdur and from clonidine patch to PO. Lasix 40 mg daily, imdur 60 mg daily, clonidine 0.2 mg TID (increased 12/7) and follow.  4.  Acute diastolic CHF exacerbation: TTE at [facility] with concentric LVH, TTE here suboptimal study but EF 65-70% without WMA .  Lasix as above.  5.  NSTEMI: s/p hep gtt (? Demand ischemia), per primary.  6.  Polypharmacy: off gabapentin, getting too much morhpine as OP, was reduced.  On norco now  7.  + urine culture: urine culture from 12/7 > 100,000 u of unidentified organism,  will reculture.   8  Dispo: pending- ? SNF  Subjective:    Perseverative on back pain- discussed mult times with pt danger of increasing BP meds.  Pt reports not eating well due to not liking the food, requested a steak.    Objective:   BP (!) 148/69 (BP Location: Left Arm)   Pulse 65   Temp 98 F (36.7 C) (Oral)   Resp 18   Ht 5\' 5"  (1.651 m)   Wt 87.2 kg   SpO2 98%   BMI 31.99 kg/m   Intake/Output Summary (Last 24 hours) at 06/22/2018 1316 Last data filed at 06/22/2018 0900 Gross per 24 hour  Intake 120 ml  Output 500 ml  Net -380 ml   Weight change:   Physical Exam: Gen: chronically ill-appearing, sitting up in bed, moving rather easaily CVS: RRR today Resp: clear bilaterally Abd: soft, nontender Ext: no LE edema Neuro: no asterixis.  AAO x 3 ACCESS: LUE AVF nonpulsatile  Imaging: Mr Virgel Paling PI Contrast  Result Date: 06/20/2018 CLINICAL DATA:  77 y/o  F; stroke for follow-up. EXAM: MRA HEAD WITHOUT CONTRAST TECHNIQUE: Angiographic images of the Circle of Willis were obtained using MRA technique without intravenous contrast. COMPARISON:  06/20/2018 MRI head. FINDINGS: Severe motion artifact. Limited assessment for stenosis, small and medial vessel occlusion, aneurysm, or vascular malformation. Flow related signal is grossly seen within the internal cerebral arteries, dominant left vertebral artery, basilar artery, as well as the bilateral M1, A1, P1. IMPRESSION: Severe motion  artifact, discussion above. Consider repeat MRA or CTA of head when patient is able to hold still. Electronically Signed   By: Kristine Garbe M.D.   On: 06/20/2018 21:23   Vas [place] Upper Ext Vein Mapping (pre-op Avf)  Result Date: 06/22/2018 UPPER EXTREMITY VEIN MAPPING  Indications: Pre-access. History: CKD STAGE V.  Limitations: Multiple IV on sites and patient discomfort. Comparison Study: No prior study available Performing Technologist: Hongying Cole  Examination Guidelines: A complete  evaluation includes B-mode imaging, spectral Doppler, color Doppler, and power Doppler as needed of all accessible portions of each vessel. Bilateral testing is considered an integral part of a complete examination. Limited examinations for reoccurring indications may be performed as noted. +-----------------+-------------+----------+----------+ Right Cephalic   Diameter (cm)Depth (cm) Findings  +-----------------+-------------+----------+----------+ Shoulder             0.36        1.03              +-----------------+-------------+----------+----------+ Prox upper arm       0.40        0.97              +-----------------+-------------+----------+----------+ Mid upper arm        0.42        0.83   branching  +-----------------+-------------+----------+----------+ Dist upper arm       0.43        0.53   branching  +-----------------+-------------+----------+----------+ Antecubital fossa    0.37        0.70              +-----------------+-------------+----------+----------+ Prox forearm                            IV on site +-----------------+-------------+----------+----------+ +-----------------+-------------+----------+------------------------+ Right Basilic    Diameter (cm)Depth (cm)        Findings         +-----------------+-------------+----------+------------------------+ Shoulder             0.49        1.28                            +-----------------+-------------+----------+------------------------+ Prox upper arm       0.48        1.10                            +-----------------+-------------+----------+------------------------+ Mid upper arm        0.31        1.25          branching         +-----------------+-------------+----------+------------------------+ Dist upper arm       0.33        1.16          branching         +-----------------+-------------+----------+------------------------+ Antecubital fossa    0.28        1.29           branching         +-----------------+-------------+----------+------------------------+ Prox forearm         0.21        0.73   branching and IV on site +-----------------+-------------+----------+------------------------+ +-----------------+-------------+----------+----------+ Left Cephalic    Diameter (cm)Depth (cm) Findings  +-----------------+-------------+----------+----------+ Shoulder             0.27  0.72              +-----------------+-------------+----------+----------+ Prox upper arm       0.34        0.47   branching  +-----------------+-------------+----------+----------+ Mid upper arm        0.20        0.37   branching  +-----------------+-------------+----------+----------+ Dist upper arm       0.22        0.45   branching  +-----------------+-------------+----------+----------+ Antecubital fossa                       Thrombosed +-----------------+-------------+----------+----------+ Prox forearm                            Thrombosed +-----------------+-------------+----------+----------+ +-----------------+-------------+----------+--------------+ Left Basilic     Diameter (cm)Depth (cm)   Findings    +-----------------+-------------+----------+--------------+ Shoulder             0.83        1.77                  +-----------------+-------------+----------+--------------+ Prox upper arm       0.49        1.53                  +-----------------+-------------+----------+--------------+ Mid upper arm                             Thrombosed   +-----------------+-------------+----------+--------------+ Dist upper arm                            Thrombosed   +-----------------+-------------+----------+--------------+ Antecubital fossa                       unable to exam +-----------------+-------------+----------+--------------+ Prox forearm                            unable to exam  +-----------------+-------------+----------+--------------+ *See table(s) above for measurements and observations.  Diagnosing physician: Harold Barban MD Electronically signed by Harold Barban MD on 06/22/2018 at 8:23:42 AM.    Final     Labs: BMET Recent Labs  Lab 06/16/18 1020 06/17/18 0225 06/18/18 1017 06/19/18 0350 06/20/18 0255 06/21/18 0233 06/22/18 0305  NA 145 142 142 140 139 139 138  K 3.6 3.2* 3.5 3.1* 3.5 3.3* 4.1  CL 103 101 101 101 103 102 101  CO2 28 26 28 25 25 24 22   GLUCOSE 241* 158* 204* 169* 166* 141* 168*  BUN 77* 83* 85* 86* 84* 77* 82*  CREATININE 5.75* 5.58* 5.87* 5.70* 5.85* 5.71* 5.69*  CALCIUM 9.5 9.2 8.9 8.6* 8.7* 9.1 9.0  PHOS  --   --  5.5*  --   --   --   --    CBC Recent Labs  Lab 06/19/18 0350 06/20/18 0255 06/21/18 0233 06/22/18 0305  WBC 12.1* 13.8* 13.7* 14.7*  NEUTROABS 9.4*  --   --   --   HGB 8.8* 8.8* 9.3* 9.5*  HCT 28.6* 29.1* 31.2* 30.5*  MCV 97.6 98.0 99.0 98.1  PLT 234 244 245 236    Medications:    . aspirin EC  81 mg Oral Daily  . cloNIDine  0.2 mg Oral TID  . donepezil  10 mg Oral QHS  .  DULoxetine  60 mg Oral Daily  . furosemide  40 mg Oral Daily  . gabapentin  100 mg Oral TID  . heparin injection (subcutaneous)  5,000 Units Subcutaneous Q8H  . insulin aspart  0-5 Units Subcutaneous QHS  . insulin aspart  0-9 Units Subcutaneous TID WC  . isosorbide mononitrate  60 mg Oral Daily  . levothyroxine  125 mcg Oral Q0600  . mouth rinse  15 mL Mouth Rinse BID  . rosuvastatin  10 mg Oral Daily  . sodium chloride flush  3 mL Intravenous Q12H      Madelon Lips, MD 06/22/2018, 1:16 PM

## 2018-06-22 NOTE — Progress Notes (Signed)
STROKE TEAM PROGRESS NOTE     SUBJECTIVE (INTERVAL HISTORY) The patient's husband was at the bedside.  The patient is feeling miserable due to back pain The patient's main complaint at this time is back pain    OBJECTIVE Vitals:   06/21/18 2106 06/21/18 2107 06/21/18 2238 06/22/18 0934  BP:  139/75 (!) 90/46 (!) 148/69  Pulse: 80  65   Resp:   18 18  Temp: (!) 97.4 F (36.3 C)  97.9 F (36.6 C) 98 F (36.7 C)  TempSrc: Oral  Oral Oral  SpO2:   96% 98%  Weight:      Height:        CBC:  Recent Labs  Lab 06/19/18 0350  06/21/18 0233 06/22/18 0305  WBC 12.1*   < > 13.7* 14.7*  NEUTROABS 9.4*  --   --   --   HGB 8.8*   < > 9.3* 9.5*  HCT 28.6*   < > 31.2* 30.5*  MCV 97.6   < > 99.0 98.1  PLT 234   < > 245 236   < > = values in this interval not displayed.    Basic Metabolic Panel:  Recent Labs  Lab 06/18/18 0811 06/19/18 0350 06/20/18 0255 06/21/18 0233 06/22/18 0305  NA 142 140 139 139 138  K 3.5 3.1* 3.5 3.3* 4.1  CL 101 101 103 102 101  CO2 28 25 25 24 22   GLUCOSE 204* 169* 166* 141* 168*  BUN 85* 86* 84* 77* 82*  CREATININE 5.87* 5.70* 5.85* 5.71* 5.69*  CALCIUM 8.9 8.6* 8.7* 9.1 9.0  MG  --  2.0 2.1  --   --   PHOS 5.5*  --   --   --   --     Lipid Panel:     Component Value Date/Time   CHOL 152 06/20/2018 0255   TRIG 207 (H) 06/20/2018 0255   HDL 34 (L) 06/20/2018 0255   CHOLHDL 4.5 06/20/2018 0255   VLDL 41 (H) 06/20/2018 0255   LDLCALC 77 06/20/2018 0255   HgbA1c:  Lab Results  Component Value Date   HGBA1C 7.5 (H) 06/20/2018   Urine Drug Screen:     Component Value Date/Time   LABOPIA PPS 05/13/2015 1211   COCAINSCRNUR NEG 05/13/2015 1211   LABBENZ NEG 05/13/2015 1211   AMPHETMU NEG 05/13/2015 1211   THCU NEG 05/13/2015 1211   LABBARB NEG 05/13/2015 1211    Alcohol Level No results found for: [facility]  IMAGING   Mr Jodene Nam Head Wo Contrast 06/20/2018 IMPRESSION:  Severe motion artifact, discussion above. Consider repeat MRA or CTA  of head when patient is able to hold still.    Mr Brain Wo Contrast 06/20/2018 IMPRESSION:  1. Several small foci of reduced diffusion are present in the posterosuperior frontal lobes, right anterolateral frontal lobe, right putamen, right occipital lobe. Findings likely represent areas of acute/early subacute infarction and/or atypical for seizure related activity. Newly visible punctate lesions were likely obscured by motion artifact on the 06/18/2018 MRI of the head.  2. No structural cause of seizure identified.  3. Stable mild chronic microvascular ischemic changes and volume loss of the brain.     Mr Brain Wo Contrast 06/19/2018 IMPRESSION:  1. Severe motion artifact of acquired coronal and axial T2 weighted sequences. Suboptimal assessment for structural causes of seizure. Additional sequences could not be acquired. Consider repeat MRI when patient is able to hold still.  2. Small focus of hyperintensity in the right  posterosuperior frontal lobe corresponding to the suspected acute/early subacute infarction on the prior MRI of the head.    Vas [place] Upper Ext Vein Mapping (pre-op Avf) 06/20/2018 UPPER EXTREMITY VEIN MAPPING  Indications: Pre-access. History: CKD STAGE V.  Limitations: Multiple IV on sites and patient discomfort. Comparison Study: No prior study available Performing Technologist: Hongying Cole  Examination Guidelines: A complete evaluation includes B-mode imaging, spectral Doppler, color Doppler, and power Doppler as needed of all accessible portions of each vessel. Bilateral testing is considered an integral part of a complete examination. Limited examinations for reoccurring indications may be performed as noted. +-----------------+-------------+----------+----------+ Right Cephalic   Diameter (cm)Depth (cm) Findings  +-----------------+-------------+----------+----------+ Shoulder             0.36        1.03               +-----------------+-------------+----------+----------+ Prox upper arm       0.40        0.97              +-----------------+-------------+----------+----------+ Mid upper arm        0.42        0.83   branching  +-----------------+-------------+----------+----------+ Dist upper arm       0.43        0.53   branching  +-----------------+-------------+----------+----------+ Antecubital fossa    0.37        0.70              +-----------------+-------------+----------+----------+ Prox forearm                            IV on site +-----------------+-------------+----------+----------+ +-----------------+-------------+----------+------------------------+ Right Basilic    Diameter (cm)Depth (cm)        Findings         +-----------------+-------------+----------+------------------------+ Shoulder             0.49        1.28                            +-----------------+-------------+----------+------------------------+ Prox upper arm       0.48        1.10                            +-----------------+-------------+----------+------------------------+ Mid upper arm        0.31        1.25          branching         +-----------------+-------------+----------+------------------------+ Dist upper arm       0.33        1.16          branching         +-----------------+-------------+----------+------------------------+ Antecubital fossa    0.28        1.29          branching         +-----------------+-------------+----------+------------------------+ Prox forearm         0.21        0.73   branching and IV on site +-----------------+-------------+----------+------------------------+ +-----------------+-------------+----------+----------+ Left Cephalic    Diameter (cm)Depth (cm) Findings  +-----------------+-------------+----------+----------+ Shoulder             0.27        0.72              +-----------------+-------------+----------+----------+  Prox  upper arm       0.34        0.47   branching  +-----------------+-------------+----------+----------+ Mid upper arm        0.20        0.37   branching  +-----------------+-------------+----------+----------+ Dist upper arm       0.22        0.45   branching  +-----------------+-------------+----------+----------+ Antecubital fossa                       Thrombosed +-----------------+-------------+----------+----------+ Prox forearm                            Thrombosed +-----------------+-------------+----------+----------+ +-----------------+-------------+----------+--------------+ Left Basilic     Diameter (cm)Depth (cm)   Findings    +-----------------+-------------+----------+--------------+ Shoulder             0.83        1.77                  +-----------------+-------------+----------+--------------+ Prox upper arm       0.49        1.53                  +-----------------+-------------+----------+--------------+ Mid upper arm                             Thrombosed   +-----------------+-------------+----------+--------------+ Dist upper arm                            Thrombosed   +-----------------+-------------+----------+--------------+ Antecubital fossa                       unable to exam +-----------------+-------------+----------+--------------+ Prox forearm                            unable to exam +-----------------+-------------+----------+--------------+ *See table(s) above for measurements and observations.  Diagnosing physician:    Preliminary      Transthoracic Echocardiogram  06/16/2018 Study Conclusions - Left ventricle: The cavity size was normal. Wall thickness was   normal. Systolic function was vigorous. The estimated ejection   fraction was in the range of 65% to 70%. Wall motion was normal;   there were no regional wall motion abnormalities. The study is   not technically sufficient to allow evaluation of LV  diastolic   function. Impressions: - Study limited to parasternal views only. Patient would not   cooperate with the rest of the study.   Bilateral Carotid Dopplers  06/17/2018 Summary: Right Carotid: There is no obvious evidence of stenosis in the right ICA. Left Carotid: There is no obvious evidence of stenosis in the left ICA Vertebrals: Bilateral vertebral arteries demonstrate antegrade flow. Subclavians: Normal flow hemodynamics were seen in bilateral subclavian       arteries.  EEG 06/16/2018 EEG Abnormalities:  1) triphasic waves 2) generalized irregular slow activity  Clinical Interpretation: This EEG is consistent with a mild generalized nonspecific cerebral dysfunction (encephalopathy). There was no seizure or seizure predisposition recorded on this study. Please note that lack of epileptiform activity on EEG does not preclude the possibility of epilepsy.      PHYSICAL EXAM Blood pressure (!) 148/69, pulse 65, temperature 98 F (36.7 C), temperature source Oral, resp. rate 18, height 5\' 5"  (1.651 m), weight  87.2 kg, SpO2 98 %.  Pleasant obese elderly Caucasian lady not in distress. . Afebrile. Head is nontraumatic. Neck is supple without bruit.    Cardiac exam no murmur or gallop. Lungs are clear to auscultation. Distal pulses are well felt.  Neurological Exam ;  Awake  Alert oriented x 3. Slow hehsitant speech but no aphasia.Diminished attention, registration and recall..eye movements full without nystagmus.fundi were not visualized. Vision acuity and fields appear normal. Hearing is normal. Palatal movements are normal. Face symmetric. Tongue midline. Normal strength, tone, reflexes and coordination. except mild weakness left hand muscles.asterixis is present bilaterally. Normal sensation. Gait deferred.       ASSESSMENT/PLAN Ms. NEOMI LAIDLER is a 77 y.o. female with history of stage IV kidney disease, hypertension, diabetes, morbid obesity,  gastroesophageal reflux disease, and chronic pain presenting with multiple falls and progressively worsening jerking movements concerning for seizures. She did not receive IV t-PA due to  late presentation.   Stroke: multiple infarcts c/w emboli from an unknown source.  Resultant  Encephalopathy and mild left sided weakness  CT head - not performed  MRI head - Several small foci of reduced diffusion are present in the posterosuperior frontal lobes, right anterolateral frontal lobe, right putamen, right occipital lobe.  MRA head - Severe motion artifact,  CTA H&N  - not ordered  Carotid Doppler - unremarkable  2D Echo  - EF 65 - 70%. Limited study due to pt motion  EEG - c/w encephalopathy  LDL - 77  HgbA1c - 7.5  UDS - negative (except opiates PPS)  VTE prophylaxis - [place] Heparin  Diet - - Heart healthy / carb modified with thin liquids.  No antithrombotic prior to admission, now on aspirin 81 mg daily  Patient counseled to be compliant with her antithrombotic medications  Ongoing aggressive stroke risk factor management  Therapy recommendations:  SNF  Disposition:  Pending  Hypertension  Stable . Permissive hypertension (OK if < 220/120) but gradually normalize in 5-7 days . Long-term BP goal normotensive  Hyperlipidemia  Lipid lowering medication PTA:  Crestor 10 mg daily  LDL 77, goal < 70  Current lipid lowering medication: Crestor 10 mg daily  Continue statin at discharge  Diabetes  HgbA1c 7.5, goal < 7.0  Uncontrolled  Other Stroke Risk Factors  Advanced age  Obesity, Body mass index is 31.99 kg/m., recommend weight loss, diet and exercise as appropriate   Obstructive sleep apnea, not on CPAP at home  Other Active Problems  Encephalopathy - improving  Leukocytosis  Anemia  Hypokalemia  ESRD  Plan continue aspirin. Transfer to rehabilitation and skilled nursing facility when bed available. Stroke team will sign off. Kindly call  for questions. Discussed with Dr. Sherwood Gambler day # 7   .    Marland Kitchen Antony Contras, MD Medical Director [facility] Pager: [phone] 06/22/2018 12:23 PM   To contact Stroke Continuity provider, please refer to [email or web address] After hours, contact General Neurology

## 2018-06-23 ENCOUNTER — Inpatient Hospital Stay (HOSPITAL_COMMUNITY): Payer: Medicare Other

## 2018-06-23 LAB — BASIC METABOLIC PANEL
Anion gap: 14 (ref 5–15)
BUN: 79 mg/dL — ABNORMAL HIGH (ref 8–23)
CO2: 23 mmol/L (ref 22–32)
Calcium: 8.7 mg/dL — ABNORMAL LOW (ref 8.9–10.3)
Chloride: 101 mmol/L (ref 98–111)
Creatinine, Ser: 5.83 mg/dL — ABNORMAL HIGH (ref 0.44–1.00)
GFR calc Af Amer: 8 mL/min — ABNORMAL LOW (ref >60)
GFR calc non Af Amer: 6 mL/min — ABNORMAL LOW (ref >60)
GLUCOSE: 180 mg/dL — AB (ref 70–99)
Potassium: 3.4 mmol/L — ABNORMAL LOW (ref 3.5–5.1)
Sodium: 138 mmol/L (ref 135–145)

## 2018-06-23 LAB — CBC
HCT: 26.8 % — ABNORMAL LOW (ref 36.0–46.0)
Hemoglobin: 8.4 g/dL — ABNORMAL LOW (ref 12.0–15.0)
MCH: 30.5 pg (ref 26.0–34.0)
MCHC: 31.3 g/dL (ref 30.0–36.0)
MCV: 97.5 fL (ref 80.0–100.0)
PLATELETS: 227 10*3/uL (ref 150–400)
RBC: 2.75 MIL/uL — ABNORMAL LOW (ref 3.87–5.11)
RDW: 14.2 % (ref 11.5–15.5)
WBC: 10.8 10*3/uL — ABNORMAL HIGH (ref 4.0–10.5)
nRBC: 0 % (ref 0.0–0.2)

## 2018-06-23 LAB — GLUCOSE, CAPILLARY
GLUCOSE-CAPILLARY: 220 mg/dL — AB (ref 70–99)
Glucose-Capillary: 156 mg/dL — ABNORMAL HIGH (ref 70–99)
Glucose-Capillary: 165 mg/dL — ABNORMAL HIGH (ref 70–99)
Glucose-Capillary: 194 mg/dL — ABNORMAL HIGH (ref 70–99)
Glucose-Capillary: 200 mg/dL — ABNORMAL HIGH (ref 70–99)
Glucose-Capillary: 206 mg/dL — ABNORMAL HIGH (ref 70–99)
Glucose-Capillary: 207 mg/dL — ABNORMAL HIGH (ref 70–99)
Glucose-Capillary: 236 mg/dL — ABNORMAL HIGH (ref 70–99)

## 2018-06-23 LAB — URINE CULTURE: Culture: >=100000 — AB

## 2018-06-23 MED ORDER — CEFAZOLIN SODIUM-DEXTROSE 2-4 GM/100ML-% IV SOLN
2.0000 g | INTRAVENOUS | Status: AC
  Administered 2018-06-24: 2 g via INTRAVENOUS
  Filled 2018-06-23: qty 100

## 2018-06-23 MED ORDER — AMOXICILLIN 500 MG PO CAPS: 500.0000 mg | ORAL_CAPSULE | Freq: Three times a day (TID) | ORAL | Status: DC

## 2018-06-23 MED ORDER — AMOXICILLIN 500 MG PO CAPS
500.0000 mg | ORAL_CAPSULE | ORAL | Status: AC
  Administered 2018-06-23 – 2018-06-25 (×3): 500 mg via ORAL
  Filled 2018-06-23 (×3): qty 1

## 2018-06-23 MED ORDER — MIRTAZAPINE 15 MG PO TABS
15.0000 mg | ORAL_TABLET | Freq: Every day | ORAL | Status: DC
  Administered 2018-06-23: 15 mg via ORAL
  Filled 2018-06-23: qty 1

## 2018-06-23 MED ORDER — GABAPENTIN 100 MG PO CAPS
100.0000 mg | ORAL_CAPSULE | Freq: Two times a day (BID) | ORAL | Status: DC
  Administered 2018-06-23 – 2018-06-25 (×4): 100 mg via ORAL
  Filled 2018-06-23 (×4): qty 1

## 2018-06-23 NOTE — Progress Notes (Signed)
Physical Therapy Treatment Patient Details Name: Paige Arnold MRN: [id number] DOB: 11-24-1940 Today's Date: 06/23/2018    History of Present Illness 77 y.o. female with medical history significant of chronic kidney disease stage IV, hypertension, diabetes, morbid obesity, GERD, osteoarthritis and chronic pain who has had progressive mild jerky movements concerning for possible seizures over the last few weeks in addition to AMS. Patient presents now with metabolic encephalopathy, work up underway.    PT Comments    Pt awake upon entry, reports that she is "not doing well at all" but declines additional detail. She is agreeable to mobilize OOB to improve strength. Her confidence has improved largely since prior PT session, but verbalizes a desire to continue to have a chair follow during AMB which is eschewed this date.  Pt is able to progress AMB distance, but continues to move very slowly indicative of elevated falls risk, and reports generally DOE and fatigue, albeit her SpO2 is 99% on room air. No LOB this session. Verbal cues to safe RW use during transfers to sitting. Pt progressing, but generally still very weak and limited in activity tolerance.      Follow Up Recommendations  SNF;Supervision/Assistance - 24 hour     Equipment Recommendations  None recommended by PT    Recommendations for Other Services       Precautions / Restrictions Precautions Precautions: Fall Restrictions Weight Bearing Restrictions: No    Mobility  Bed Mobility Overal bed mobility: Needs Assistance Bed Mobility: Supine to Sit;Sit to Supine     Supine to sit: Supervision Sit to supine: Min assist(help with Legs into bed )      Transfers Overall transfer level: Needs assistance Equipment used: Rolling walker (2 wheeled) Transfers: Sit to/from Stand Sit to Stand: Supervision         General transfer comment: improved confindence, but remains slow and labored.    Ambulation/Gait Ambulation/Gait assistance: Min guard Gait Distance (Feet): 65 Feet(72ft; seated rest; 61ft ) Assistive device: Rolling walker (2 wheeled) Gait Pattern/deviations: Step-to pattern(a 3-point gait pattern at times. Difficulty turning RW ) Gait velocity: <0.59m/s Gait velocity interpretation: <1.31 ft/sec, indicative of household ambulator     Stairs             Wheelchair Mobility    Modified Rankin (Stroke Patients Only)       Balance           Standing balance support: During functional activity;Single extremity supported Standing balance-Leahy Scale: Fair Standing balance comment: attems to eschew RW for turn back into bed, VC to maintain RW close until ready to sit             High level balance activites: Turns              Cognition Arousal/Alertness: Awake/alert Behavior During Therapy: WFL for tasks assessed/performed Overall Cognitive Status: Within Functional Limits for tasks assessed                                        Exercises      General Comments        Pertinent Vitals/Pain Pain Assessment: ("i have pain all over"; pt declines pain rating) Pain Intervention(s): Limited activity within patient's tolerance;Monitored during session    Home Living                      Prior Function  PT Goals (current goals can now be found in the care plan section) Acute Rehab PT Goals Patient Stated Goal: none stated PT Goal Formulation: With patient Time For Goal Achievement: 06/30/18 Potential to Achieve Goals: Good Progress towards PT goals: Progressing toward goals    Frequency    Min 2X/week      PT Plan Current plan remains appropriate    Co-evaluation              AM-PAC PT "6 Clicks" Mobility   Outcome Measure  Help needed turning from your back to your side while in a flat bed without using bedrails?: None Help needed moving from lying on your back to  sitting on the side of a flat bed without using bedrails?: None Help needed moving to and from a bed to a chair (including a wheelchair)?: A Little Help needed standing up from a chair using your arms (e.g., wheelchair or bedside chair)?: A Little Help needed to walk in hospital room?: A Little Help needed climbing 3-5 steps with a railing? : A Lot 6 Click Score: 19    End of Session Equipment Utilized During Treatment: Gait belt Activity Tolerance: Patient limited by fatigue Patient left: in bed;with bed alarm set;with call bell/phone within reach Nurse Communication: Mobility status PT Visit Diagnosis: Difficulty in walking, not elsewhere classified (R26.2);Other symptoms and signs involving the nervous system (R29.898)     Time: 4166-0630 PT Time Calculation (min) (ACUTE ONLY): 15 min  Charges:  $Therapeutic Activity: 8-22 mins               5:01 PM, 06/23/18 5:01 PM, 06/23/18 Etta Grandchild, PT, DPT Physical Therapist - [place] [phone] (Pager)  [phone] (Office)      Sun Kihn C 06/23/2018, 4:58 PM

## 2018-06-23 NOTE — Clinical Social Work Note (Signed)
[facility] has received [facility] auth for pt to admit to facility. CSW continuing to follow for d/c.   [place], [place]

## 2018-06-23 NOTE — Progress Notes (Signed)
PROGRESS NOTE  Paige Arnold DPO:[id number] DOB: May 28, 1941 DOA: 06/15/2018 PCP: Angelina Sheriff, MD   LOS: 8 days   Brief Narrative / Interim history: 77 y.o.femalew/ a hx of CKD stage IV, HTN, diabetes, obesity, GERD, osteoarthritis and chronic pain who reported progressive mild jerky movements concerning for possible seizures over a few weeks, followed by a 30-minute stage of general confusion. She was seen by her Neurologist who recommended an MRI and adjusted her medications (renal dosing) but her symptoms have gotten worse in the interim. Upon presentation to the ED she was agitated and confused. She was found to have a systolic of 431. CT head was normal. Exam noted no evidence of focal neurologic deficit. Her creatinine was 4.6 with a baseline creatinine of 3.6 about a month prior. Troponin steadily rose frome 0.3 > 0.9.  Cardiology recommended medical management with heparin and nitroglycerin for NSTEMI. Given her worsening renal fxn she was transferred to [facility].   Subjective: Continues to complain of pain in the back.  No nausea no vomiting no fever no chills.  Mentation is actually better.  Assessment & Plan: Principal Problem:   Metabolic encephalopathy Active Problems:   Diabetes mellitus type 2 with complications, uncontrolled (HCC)   Chronic kidney disease (CKD), stage IV (severe) (HCC)   Hypertensive urgency   Hypothyroidism   Acute metabolic encephalopathy   NSTEMI (non-ST elevated myocardial infarction) (HCC)   CVA (cerebral vascular accident) ([place])   Principal Problem Acute metabolic encephalopathy Likely combination of polypharmacy as well as hypertensive encephalopathy subacute CVA on MRI possible pres to severe high blood pressure  EEG obtained which showed generalized nonspecific cerebral dysfunction without seizure activity, very nonspecific Likely combination of polypharmacy as well as hypertensive encephalopathy. Discontinue morphine. Transition to  Norco to avoid morphine metabolites. Outpatient follow-up with pain management clinic  MRI is positive for acute stroke. Stroke team was consulted, currently recommend aspirin. Initial plan was to perform a TEE although currently they feel that this is all small vessel disease and therefore would not recommend any further work-up. Blood cultures were ordered which are currently negative. Urine culture is positive for enterococcus and therefore will treat with oral amoxicillin.  Acute kidney injury on chronic kidney disease stage IV-V Baseline creatinine in the mid threes, currently in the 5 range.  Nephrology consulted, discussed with Dr. Hollie Salk, appreciate input and defer further management to nephrology Vascular surgery consulted for graft placement. Pt cleared per neurology.   Type 2 diabetes mellitus, poorly controlled, with renal complication -Most recent A1c 7.5. -Continue sliding scale, hold home medications  Hypertensive urgency -Initial blood pressure at 220-240, status post Cardene drip utilized at [employer] -Continue clonidine patch and IV hydralazine here, she is lethargic at times and p.o. intake is not consistent  Chronic pain -Has been on morphine 3 times daily for a long time.  Her morphine is likely toxic in the setting of worsening renal function given excellent response to Narcan Received Narcan twice 06/17/2018 Daughter is at bedside and extremely vocal regarding withdrawals and she would not want her mother to go through withdrawals.   Discontinue morphine. Transition to Norco and monitor.  Recommend to avoid morphine going forward. Discussed with daughter, would not increase the narcotics any further than every 6 hours of Norco. We will add gabapentin and monitor.  Elevated TSH -Continue home regimen and repeat TSH as an outpatient.  Acute diastolic CHF -Since admission at [facility] patient diuresed over 3 L of fluids, no previous heart  history.  2D echo here  normal LVEF.  Currently she is euvolemic  Dementia -Continue Aricept, apparently at baseline she is highly functional   Scheduled Meds: . amoxicillin  500 mg Oral Q24H  . aspirin EC  81 mg Oral Daily  . cloNIDine  0.2 mg Oral TID  . donepezil  10 mg Oral QHS  . DULoxetine  60 mg Oral Daily  . furosemide  40 mg Oral Daily  . gabapentin  100 mg Oral TID  . heparin injection (subcutaneous)  5,000 Units Subcutaneous Q8H  . insulin aspart  0-5 Units Subcutaneous QHS  . insulin aspart  0-9 Units Subcutaneous TID WC  . isosorbide mononitrate  60 mg Oral Daily  . levothyroxine  125 mcg Oral Q0600  . mouth rinse  15 mL Mouth Rinse BID  . rosuvastatin  10 mg Oral Daily  . sodium chloride flush  3 mL Intravenous Q12H   Continuous Infusions: . sodium chloride 10 mL/hr at 06/16/18 2000  . [START ON 06/24/2018]  ceFAZolin (ANCEF) IV     PRN Meds:.sodium chloride, acetaminophen, haloperidol lactate, HYDROcodone-acetaminophen, sodium chloride flush  DVT prophylaxis: heparin Code Status: Full code Family Communication: Extensive discussion with daughter at bedside Disposition Plan: Home when ready  Consultants:   Nephrology  Procedures:   2D echo Impressions: - Study limited to parasternal views only. Patient would not cooperate with the rest of the study.  Antimicrobials:  None    Objective: Vitals:   06/22/18 2223 06/22/18 2248 06/23/18 0544 06/23/18 0822  BP: (!) 96/51 (!) 105/51  (!) 113/54  Pulse: 63   65  Resp: 18  17 18   Temp: 98.4 F (36.9 C)   98.2 F (36.8 C)  TempSrc: Oral     SpO2: 97%   96%  Weight:      Height:        Intake/Output Summary (Last 24 hours) at 06/23/2018 1421 Last data filed at 06/23/2018 0600 Gross per 24 hour  Intake 240 ml  Output 250 ml  Net -10 ml   Filed Weights   06/15/18 1800  Weight: 87.2 kg    Examination:  Constitutional: Lethargic, alert after Narcan, interactive, Eyes: lids and conjunctivae normal ENMT: Mucous  membranes are moist.  Neck: normal, supple Respiratory: clear to auscultation bilaterally, no wheezing, no crackles. Normal respiratory effort. No accessory muscle use.  Cardiovascular: Regular rate and rhythm, no murmurs / rubs / gallops. No LE edema. 2+ pedal pulses. No carotid bruits. Abdomen: no tenderness Musculoskeletal: no clubbing / cyanosis.  Skin: no rashes Neurologic: Nonfocal   Data Reviewed: I have independently reviewed following labs and imaging studies   CBC: Recent Labs  Lab 06/19/18 0350 06/20/18 0255 06/21/18 0233 06/22/18 0305 06/23/18 0225  WBC 12.1* 13.8* 13.7* 14.7* 10.8*  NEUTROABS 9.4*  --   --   --   --   HGB 8.8* 8.8* 9.3* 9.5* 8.4*  HCT 28.6* 29.1* 31.2* 30.5* 26.8*  MCV 97.6 98.0 99.0 98.1 97.5  PLT 234 244 245 236 606   Basic Metabolic Panel: Recent Labs  Lab 06/18/18 0811 06/19/18 0350 06/20/18 0255 06/21/18 0233 06/22/18 0305 06/23/18 0225  NA 142 140 139 139 138 138  K 3.5 3.1* 3.5 3.3* 4.1 3.4*  CL 101 101 103 102 101 101  CO2 28 25 25 24 22 23   GLUCOSE 204* 169* 166* 141* 168* 180*  BUN 85* 86* 84* 77* 82* 79*  CREATININE 5.87* 5.70* 5.85* 5.71* 5.69* 5.83*  CALCIUM  8.9 8.6* 8.7* 9.1 9.0 8.7*  MG  --  2.0 2.1  --   --   --   PHOS 5.5*  --   --   --   --   --    GFR: Estimated Creatinine Clearance: 9 mL/min (A) (by C-G formula based on SCr of 5.83 mg/dL (H)). Liver Function Tests: Recent Labs  Lab 06/17/18 0225 06/18/18 0811 06/19/18 0350  AST 35  --  25  ALT 26  --  27  ALKPHOS 68  --  57  BILITOT 1.0  --  0.9  PROT 5.9*  --  5.4*  ALBUMIN 2.9* 2.7* 2.6*   No results for input(s): LIPASE, AMYLASE in the last 168 hours. No results for input(s): AMMONIA in the last 168 hours. Coagulation Profile: No results for input(s): INR, PROTIME in the last 168 hours. Cardiac Enzymes: Recent Labs  Lab 06/17/18 0608 06/17/18 1151  CKTOTAL  --  503*  TROPONINI 0.13*  --    BNP (last 3 results) No results for input(s):  PROBNP in the last 8760 hours. HbA1C: No results for input(s): HGBA1C in the last 72 hours. CBG: Recent Labs  Lab 06/22/18 1952 06/23/18 0004 06/23/18 0349 06/23/18 0729 06/23/18 1211  GLUCAP 307* 194* 165* 207* 220*   Lipid Profile: No results for input(s): CHOL, HDL, LDLCALC, TRIG, CHOLHDL, LDLDIRECT in the last 72 hours. Thyroid Function Tests: No results for input(s): TSH, T4TOTAL, FREET4, T3FREE, THYROIDAB in the last 72 hours. Anemia Panel: No results for input(s): VITAMINB12, FOLATE, FERRITIN, TIBC, IRON, RETICCTPCT in the last 72 hours. Urine analysis:    Component Value Date/Time   COLORURINE YELLOW 06/16/2018 [place] 06/16/2018 1035   LABSPEC 1.013 06/16/2018 1035   PHURINE 6.0 06/16/2018 1035   GLUCOSEU 50 (A) 06/16/2018 1035   HGBUR MODERATE (A) 06/16/2018 1035   BILIRUBINUR NEGATIVE 06/16/2018 1035   KETONESUR NEGATIVE 06/16/2018 1035   PROTEINUR >=300 (A) 06/16/2018 1035   UROBILINOGEN 0.2 03/09/2014 1331   NITRITE NEGATIVE 06/16/2018 1035   LEUKOCYTESUR NEGATIVE 06/16/2018 1035   Sepsis Labs: Invalid input(s): PROCALCITONIN, LACTICIDVEN  Recent Results (from the past 240 hour(s))  MRSA PCR Screening     Status: None   Collection Time: 06/15/18  8:00 PM  Result Value Ref Range Status   MRSA by PCR NEGATIVE NEGATIVE Final    Comment:        The GeneXpert MRSA Assay (FDA approved for NASAL specimens only), is one component of a comprehensive MRSA colonization surveillance program. It is not intended to diagnose MRSA infection nor to guide or monitor treatment for MRSA infections. Performed at [facility], [place] [address], [place], [place] [address]   Culture, Urine     Status: Abnormal   Collection Time: 06/21/18  5:00 AM  Result Value Ref Range Status   Specimen Description URINE, CATHETERIZED  Final   Special Requests   Final    NONE Performed at [facility], [place] [address], [place],  [address]     Culture >=100,000 COLONIES/mL ENTEROCOCCUS FAECALIS (A)  Final   Report Status 06/23/2018 FINAL  Final   Organism ID, Bacteria ENTEROCOCCUS FAECALIS (A)  Final      Susceptibility   Enterococcus faecalis - MIC*    AMPICILLIN <=2 SENSITIVE Sensitive     LEVOFLOXACIN >=8 RESISTANT Resistant     NITROFURANTOIN <=16 SENSITIVE Sensitive     VANCOMYCIN 1 SENSITIVE Sensitive     * >=100,000  COLONIES/mL ENTEROCOCCUS FAECALIS  Culture, blood (routine x 2)     Status: None (Preliminary result)   Collection Time: 06/21/18 11:41 AM  Result Value Ref Range Status   Specimen Description BLOOD LEFT ANTECUBITAL  Final   Special Requests   Final    BOTTLES DRAWN AEROBIC AND ANAEROBIC Blood Culture adequate volume   Culture   Final    NO GROWTH 1 DAY Performed at [facility], [place] [address], [place], [place] [address]    Report Status PENDING  Incomplete  Culture, blood (routine x 2)     Status: None (Preliminary result)   Collection Time: 06/21/18 12:11 PM  Result Value Ref Range Status   Specimen Description BLOOD BLOOD RIGHT HAND  Final   Special Requests   Final    BOTTLES DRAWN AEROBIC AND ANAEROBIC Blood Culture adequate volume   Culture   Final    NO GROWTH 1 DAY Performed at [facility], [place] [address] [place], [place] [address]    Report Status PENDING  Incomplete      Radiology Studies: No results found.  Time spent: 40 minutes,   Author:  Berle Mull, MD [facility] Pager: [phone] 06/23/2018 2:21 PM     If 7PM-7AM, please contact night-coverage [email or web address] Password [email or web address] 06/23/2018, 2:21 PM

## 2018-06-23 NOTE — Progress Notes (Signed)
[facility] ROUNDING NOTE   Subjective:   Mental status appears to have improved.  She is awake alert and oriented this morning.  Blood pressure 113/54 pulse 65 temperature 98.2 urine output 500 cc  Sodium 138 potassium 3.4 chloride 101 CO2 23 BUN 79 creatinine 5.3 glucose 180 calcium 8.7 WBC 10.8 hemoglobin 8.4 platelets 227  Objective:  Vital signs in last 24 hours:  Temp:  [98 F (36.7 C)-98.4 F (36.9 C)] 98.2 F (36.8 C) (12/09 0822) Pulse Rate:  [63-74] 65 (12/09 0822) Resp:  [17-20] 18 (12/09 0822) BP: (96-148)/(51-69) 113/54 (12/09 0822) SpO2:  [96 %-99 %] 96 % (12/09 0822)  Weight change:  Filed Weights   06/15/18 1800  Weight: 87.2 kg    Intake/Output: I/O last 3 completed shifts: In: 360 [P.O.:360] Out: 750 [Urine:750]   Intake/Output this shift:  No intake/output data recorded.   Chronically ill appearing lady awake alert and oriented no asterixis CVS- RRR RS- CTA ABD- BS present soft non-distended EXT- no edema left upper arm AV fistula nonpulsatile   Basic Metabolic Panel: Recent Labs  Lab 06/18/18 0811 06/19/18 0350 06/20/18 0255 06/21/18 0233 06/22/18 0305 06/23/18 0225  NA 142 140 139 139 138 138  K 3.5 3.1* 3.5 3.3* 4.1 3.4*  CL 101 101 103 102 101 101  CO2 28 25 25 24 22 23   GLUCOSE 204* 169* 166* 141* 168* 180*  BUN 85* 86* 84* 77* 82* 79*  CREATININE 5.87* 5.70* 5.85* 5.71* 5.69* 5.83*  CALCIUM 8.9 8.6* 8.7* 9.1 9.0 8.7*  MG  --  2.0 2.1  --   --   --   PHOS 5.5*  --   --   --   --   --     Liver Function Tests: Recent Labs  Lab 06/16/18 1020 06/17/18 0225 06/18/18 0811 06/19/18 0350  AST 37 35  --  25  ALT 24 26  --  27  ALKPHOS 78 68  --  57  BILITOT 0.8 1.0  --  0.9  PROT 6.2* 5.9*  --  5.4*  ALBUMIN 3.3* 2.9* 2.7* 2.6*   No results for input(s): LIPASE, AMYLASE in the last 168 hours. Recent Labs  Lab 06/16/18 1020  AMMONIA 16    CBC: Recent Labs  Lab 06/19/18 0350 06/20/18 0255  06/21/18 0233 06/22/18 0305 06/23/18 0225  WBC 12.1* 13.8* 13.7* 14.7* 10.8*  NEUTROABS 9.4*  --   --   --   --   HGB 8.8* 8.8* 9.3* 9.5* 8.4*  HCT 28.6* 29.1* 31.2* 30.5* 26.8*  MCV 97.6 98.0 99.0 98.1 97.5  PLT 234 244 245 236 227    Cardiac Enzymes: Recent Labs  Lab 06/17/18 0608 06/17/18 1151  CKTOTAL  --  503*  TROPONINI 0.13*  --     BNP: Invalid input(s): POCBNP  CBG: Recent Labs  Lab 06/22/18 0848 06/22/18 1224 06/22/18 1952 06/23/18 0004 06/23/18 0349  GLUCAP 162* 225* 307* 194* 165*    Microbiology: Results for orders placed or performed during the hospital encounter of 06/15/18  MRSA PCR Screening     Status: None   Collection Time: 06/15/18  8:00 PM  Result Value Ref Range Status   MRSA by PCR NEGATIVE NEGATIVE Final    Comment:        The GeneXpert MRSA Assay (FDA approved for NASAL specimens only), is one component of a comprehensive MRSA colonization surveillance program. It is not intended to diagnose MRSA infection nor to guide  or monitor treatment for MRSA infections. Performed at [facility], [place] [address] [place], [place] [address]   Culture, Urine     Status: Abnormal   Collection Time: 06/21/18  5:00 AM  Result Value Ref Range Status   Specimen Description URINE, CATHETERIZED  Final   Special Requests   Final    NONE Performed at [facility], [place] [address] [place], [place] [address]    Culture >=100,000 COLONIES/mL ENTEROCOCCUS FAECALIS (A)  Final   Report Status 06/23/2018 FINAL  Final   Organism ID, Bacteria ENTEROCOCCUS FAECALIS (A)  Final      Susceptibility   Enterococcus faecalis - MIC*    AMPICILLIN <=2 SENSITIVE Sensitive     LEVOFLOXACIN >=8 RESISTANT Resistant     NITROFURANTOIN <=16 SENSITIVE Sensitive     VANCOMYCIN 1 SENSITIVE Sensitive     * >=100,000 COLONIES/mL ENTEROCOCCUS FAECALIS  Culture, blood (routine x 2)     Status: None (Preliminary result)   Collection Time: 06/21/18 11:41 AM   Result Value Ref Range Status   Specimen Description BLOOD LEFT ANTECUBITAL  Final   Special Requests   Final    BOTTLES DRAWN AEROBIC AND ANAEROBIC Blood Culture adequate volume   Culture   Final    NO GROWTH 1 DAY Performed at [facility], [place] [address] [place], [place] [address]    Report Status PENDING  Incomplete  Culture, blood (routine x 2)     Status: None (Preliminary result)   Collection Time: 06/21/18 12:11 PM  Result Value Ref Range Status   Specimen Description BLOOD BLOOD RIGHT HAND  Final   Special Requests   Final    BOTTLES DRAWN AEROBIC AND ANAEROBIC Blood Culture adequate volume   Culture   Final    NO GROWTH 1 DAY Performed at [facility], [place] [address] [place],  [address]    Report Status PENDING  Incomplete    Coagulation Studies: No results for input(s): LABPROT, INR in the last 72 hours.  Urinalysis: No results for input(s): COLORURINE, LABSPEC, PHURINE, GLUCOSEU, HGBUR, BILIRUBINUR, KETONESUR, PROTEINUR, UROBILINOGEN, NITRITE, LEUKOCYTESUR in the last 72 hours.  Invalid input(s): APPERANCEUR    Imaging: No results found.   Medications:   . sodium chloride 10 mL/hr at 06/16/18 2000  . [START ON 06/24/2018]  ceFAZolin (ANCEF) IV     . aspirin EC  81 mg Oral Daily  . cloNIDine  0.2 mg Oral TID  . donepezil  10 mg Oral QHS  . DULoxetine  60 mg Oral Daily  . furosemide  40 mg Oral Daily  . gabapentin  100 mg Oral TID  . heparin injection (subcutaneous)  5,000 Units Subcutaneous Q8H  . insulin aspart  0-5 Units Subcutaneous QHS  . insulin aspart  0-9 Units Subcutaneous TID WC  . isosorbide mononitrate  60 mg Oral Daily  . levothyroxine  125 mcg Oral Q0600  . mouth rinse  15 mL Mouth Rinse BID  . rosuvastatin  10 mg Oral Daily  . sodium chloride flush  3 mL Intravenous Q12H   sodium chloride, acetaminophen, haloperidol lactate, HYDROcodone-acetaminophen, sodium chloride flush  Assessment/ Plan:   Acute on chronic  kidney disease stage V secondary to diabetes diabetic nephropathy creatinine at the level of the mid 5 mg/dL.  With dialysis access planned for Tuesday, 06/24/2018.  Appears to be no indication for emergent dialysis.  Acute encephalopathy EEG without seizure activity carotid ultrasound without obvious stenosis  MRI showing some acute/subacute posterior superior frontal lobe infarcts and right anterior frontal lobe motion artifact   Hypertensive urgency appears to be somewhat improved with clonidine 0.2 mg 3 times daily and isosorbide 60 mg daily.  She continues on Lasix 40 mg daily  Acute diastolic heart failure TTE at [facility] showed concentric LVH EF 65 to 70% without wall motion abnormality  N STEMI followed by primary team  Polypharmacy now off gabapentin  Urine culture 06/21/2018 shows enterococcus UTI appears to been started on Ancef will need to switch to sensitive organism.  Recommend dose of vancomycin.     LOS: [place] @TODAY @8 :59 AM

## 2018-06-23 NOTE — Progress Notes (Signed)
Inpatient Diabetes Program Recommendations  AACE/ADA: New Consensus Statement on Inpatient Glycemic Control (2015)  Target Ranges:  Prepandial:   less than 140 mg/dL      Peak postprandial:   less than 180 mg/dL (1-2 hours)      Critically ill patients:  140 - 180 mg/dL   Lab Results  Component Value Date   GLUCAP 207 (H) 06/23/2018   HGBA1C 7.5 (H) 06/20/2018    Review of Glycemic ControlResults for Paige Arnold, Paige Arnold (MRN [id number]) as of 06/23/2018 12:10  Ref. Range 06/22/2018 08:48 06/22/2018 12:24 06/22/2018 16:34 06/22/2018 19:52 06/23/2018 00:04 06/23/2018 03:49 06/23/2018 07:29  Glucose-Capillary Latest Ref Range: 70 - 99 mg/dL 162 (H) 225 (H) 236 (H) 307 (H) 194 (H) 165 (H) 207 (H)    Diabetes history: DM 2  Outpatient Diabetes medications:  Trulicity 1.5 mg q Sunday, Amaryl 6 mg daily with breakfast, Actos 15 mg daily Current orders for Inpatient glycemic control:  Novolog sensitive tid with meals and HS Inpatient Diabetes Program Recommendations:   Note that blood sugars> goal.  May consider adding low dose basal insulin while in the hospital. Consider Levemir 8 units q AM.    Thanks,  Adah Perl, RN, BC-ADM Inpatient Diabetes Coordinator Pager [phone] (8a-5p)

## 2018-06-23 NOTE — Progress Notes (Signed)
PHARMACY NOTE:  ANTIMICROBIAL RENAL DOSAGE ADJUSTMENT  Current antimicrobial regimen includes a mismatch between antimicrobial dosage and estimated renal function.  As per policy approved by the Pharmacy & Therapeutics and Medical Executive Committees, the antimicrobial dosage will be adjusted accordingly.  Current antimicrobial dosage:  Amoxil 500mg  PO q8h  Indication: UTI  Renal Function:  Estimated Creatinine Clearance: 9 mL/min (A) (by C-G formula based on SCr of 5.83 mg/dL (H)). []      On intermittent HD, scheduled: []      On CRRT    Antimicrobial dosage has been changed to:  Amoxil 500mg  PO q24h  Additional comments:   Calli Bashor A. Levada Dy, PharmD, [place] Pager: [phone] Please utilize Amion for appropriate phone number to reach the unit pharmacist ([place])   06/23/2018 9:25 AM

## 2018-06-24 ENCOUNTER — Inpatient Hospital Stay (HOSPITAL_COMMUNITY): Payer: Medicare Other | Admitting: Certified Registered Nurse Anesthetist

## 2018-06-24 ENCOUNTER — Telehealth: Payer: Self-pay | Admitting: Vascular Surgery

## 2018-06-24 ENCOUNTER — Other Ambulatory Visit (HOSPITAL_COMMUNITY): Payer: Self-pay

## 2018-06-24 ENCOUNTER — Encounter (HOSPITAL_COMMUNITY)
Admission: AD | Disposition: A | Discharge status: Skilled Nursing Facility | Payer: Self-pay | Source: Other Acute Inpatient Hospital | Attending: Internal Medicine

## 2018-06-24 ENCOUNTER — Encounter (HOSPITAL_COMMUNITY): Payer: Self-pay | Admitting: Certified Registered Nurse Anesthetist

## 2018-06-24 HISTORY — PX: AV FISTULA PLACEMENT: SHX1204

## 2018-06-24 LAB — BASIC METABOLIC PANEL
ANION GAP: 14 (ref 5–15)
BUN: 80 mg/dL — ABNORMAL HIGH (ref 8–23)
CO2: 23 mmol/L (ref 22–32)
Calcium: 9 mg/dL (ref 8.9–10.3)
Chloride: 101 mmol/L (ref 98–111)
Creatinine, Ser: 5.99 mg/dL — ABNORMAL HIGH (ref 0.44–1.00)
GFR calc Af Amer: 7 mL/min — ABNORMAL LOW (ref >60)
GFR calc non Af Amer: 6 mL/min — ABNORMAL LOW (ref >60)
GLUCOSE: 193 mg/dL — AB (ref 70–99)
Potassium: 3.5 mmol/L (ref 3.5–5.1)
Sodium: 138 mmol/L (ref 135–145)

## 2018-06-24 LAB — CBC
HCT: 26.1 % — ABNORMAL LOW (ref 36.0–46.0)
Hemoglobin: 8.2 g/dL — ABNORMAL LOW (ref 12.0–15.0)
MCH: 31.1 pg (ref 26.0–34.0)
MCHC: 31.4 g/dL (ref 30.0–36.0)
MCV: 98.9 fL (ref 80.0–100.0)
Platelets: 213 10*3/uL (ref 150–400)
RBC: 2.64 MIL/uL — ABNORMAL LOW (ref 3.87–5.11)
RDW: 14.6 % (ref 11.5–15.5)
WBC: 10.1 10*3/uL (ref 4.0–10.5)
nRBC: 0 % (ref 0.0–0.2)

## 2018-06-24 LAB — GLUCOSE, CAPILLARY
Glucose-Capillary: 165 mg/dL — ABNORMAL HIGH (ref 70–99)
Glucose-Capillary: 182 mg/dL — ABNORMAL HIGH (ref 70–99)
Glucose-Capillary: 173 mg/dL — ABNORMAL HIGH (ref 70–99)
Glucose-Capillary: 150 mg/dL — ABNORMAL HIGH (ref 70–99)
Glucose-Capillary: 146 mg/dL — ABNORMAL HIGH (ref 70–99)

## 2018-06-24 SURGERY — ECHOCARDIOGRAM, TRANSESOPHAGEAL
Anesthesia: Moderate Sedation

## 2018-06-24 SURGERY — INSERTION OF ARTERIOVENOUS (AV) GORE-TEX GRAFT ARM
Anesthesia: Monitor Anesthesia Care | Site: Arm Upper | Laterality: Left

## 2018-06-24 MED ORDER — ACETAMINOPHEN 160 MG/5ML PO SOLN: 1000.0000 mg | Freq: Once | ORAL | Status: AC | PRN

## 2018-06-24 MED ORDER — PROTAMINE SULFATE 10 MG/ML IV SOLN
INTRAVENOUS | Status: AC
  Filled 2018-06-24: qty 5

## 2018-06-24 MED ORDER — FENTANYL CITRATE (PF) 100 MCG/2ML IJ SOLN
INTRAMUSCULAR | Status: AC
  Filled 2018-06-24: qty 2

## 2018-06-24 MED ORDER — PROPOFOL 10 MG/ML IV BOLUS
INTRAVENOUS | Status: AC
  Filled 2018-06-24: qty 20

## 2018-06-24 MED ORDER — ACETAMINOPHEN 500 MG PO TABS
1000.0000 mg | ORAL_TABLET | Freq: Once | ORAL | Status: AC | PRN
  Administered 2018-06-24: 1000 mg via ORAL

## 2018-06-24 MED ORDER — HYDROCODONE-ACETAMINOPHEN 7.5-325 MG PO TABS: 1.0000 | ORAL_TABLET | Freq: Once | ORAL | Status: DC | PRN

## 2018-06-24 MED ORDER — PROPOFOL 500 MG/50ML IV EMUL
INTRAVENOUS | Status: DC | PRN
  Administered 2018-06-24: 50 ug/kg/min via INTRAVENOUS

## 2018-06-24 MED ORDER — ACETAMINOPHEN 500 MG PO TABS
ORAL_TABLET | ORAL | Status: AC
  Administered 2018-06-24: 1000 mg via ORAL
  Filled 2018-06-24: qty 2

## 2018-06-24 MED ORDER — SODIUM CHLORIDE 0.9 % IV SOLN
INTRAVENOUS | Status: AC
  Filled 2018-06-24: qty 1.2

## 2018-06-24 MED ORDER — LIDOCAINE 2% (20 MG/ML) 5 ML SYRINGE
INTRAMUSCULAR | Status: DC | PRN
  Administered 2018-06-24: 40 mg via INTRAVENOUS

## 2018-06-24 MED ORDER — HEPARIN SODIUM (PORCINE) 1000 UNIT/ML IJ SOLN
INTRAMUSCULAR | Status: AC
  Filled 2018-06-24: qty 1

## 2018-06-24 MED ORDER — MIDAZOLAM HCL 2 MG/2ML IJ SOLN
INTRAMUSCULAR | Status: AC
  Filled 2018-06-24: qty 2

## 2018-06-24 MED ORDER — FENTANYL CITRATE (PF) 100 MCG/2ML IJ SOLN
25.0000 ug | INTRAMUSCULAR | Status: DC | PRN
  Administered 2018-06-24: 25 ug via INTRAVENOUS

## 2018-06-24 MED ORDER — SODIUM CHLORIDE 0.9 % IV SOLN
INTRAVENOUS | Status: DC | PRN
  Administered 2018-06-24: 30 ug/min via INTRAVENOUS

## 2018-06-24 MED ORDER — HEPARIN SODIUM (PORCINE) 1000 UNIT/ML IJ SOLN
INTRAMUSCULAR | Status: DC | PRN
  Administered 2018-06-24: 5000 [IU] via INTRAVENOUS

## 2018-06-24 MED ORDER — LIDOCAINE HCL (PF) 1 % IJ SOLN
INTRAMUSCULAR | Status: DC | PRN
  Administered 2018-06-24: 22 mL

## 2018-06-24 MED ORDER — FENTANYL CITRATE (PF) 250 MCG/5ML IJ SOLN
INTRAMUSCULAR | Status: AC
  Filled 2018-06-24: qty 5

## 2018-06-24 MED ORDER — LIDOCAINE HCL (PF) 1 % IJ SOLN
INTRAMUSCULAR | Status: AC
  Filled 2018-06-24: qty 30

## 2018-06-24 MED ORDER — ONDANSETRON HCL 4 MG/2ML IJ SOLN
INTRAMUSCULAR | Status: AC
  Filled 2018-06-24: qty 2

## 2018-06-24 MED ORDER — PROTAMINE SULFATE 10 MG/ML IV SOLN
INTRAVENOUS | Status: DC | PRN
  Administered 2018-06-24: 10 mg via INTRAVENOUS
  Administered 2018-06-24: 20 mg via INTRAVENOUS
  Administered 2018-06-24 (×2): 10 mg via INTRAVENOUS

## 2018-06-24 MED ORDER — LIDOCAINE 2% (20 MG/ML) 5 ML SYRINGE
INTRAMUSCULAR | Status: AC
  Filled 2018-06-24: qty 5

## 2018-06-24 MED ORDER — ACETAMINOPHEN 10 MG/ML IV SOLN: 1000.0000 mg | Freq: Once | INTRAVENOUS | Status: DC | PRN

## 2018-06-24 MED ORDER — SODIUM CHLORIDE 0.9 % IV SOLN: INTRAVENOUS | Status: DC

## 2018-06-24 MED ORDER — ONDANSETRON HCL 4 MG/2ML IJ SOLN
INTRAMUSCULAR | Status: DC | PRN
  Administered 2018-06-24: 4 mg via INTRAVENOUS

## 2018-06-24 MED ORDER — PHENYLEPHRINE HCL 10 MG/ML IJ SOLN
INTRAMUSCULAR | Status: DC | PRN
  Administered 2018-06-24: 40 ug via INTRAVENOUS
  Administered 2018-06-24: 80 ug via INTRAVENOUS

## 2018-06-24 MED ORDER — PHENYLEPHRINE 40 MCG/ML (10ML) SYRINGE FOR IV PUSH (FOR BLOOD PRESSURE SUPPORT)
PREFILLED_SYRINGE | INTRAVENOUS | Status: AC
  Filled 2018-06-24: qty 10

## 2018-06-24 MED ORDER — FENTANYL CITRATE (PF) 100 MCG/2ML IJ SOLN
INTRAMUSCULAR | Status: DC | PRN
  Administered 2018-06-24 (×4): 25 ug via INTRAVENOUS

## 2018-06-24 MED ORDER — 0.9 % SODIUM CHLORIDE (POUR BTL) OPTIME
TOPICAL | Status: DC | PRN
  Administered 2018-06-24: 1000 mL

## 2018-06-24 MED ORDER — MIRTAZAPINE 15 MG PO TABS
7.5000 mg | ORAL_TABLET | Freq: Every day | ORAL | Status: DC
  Administered 2018-06-24: 7.5 mg via ORAL
  Filled 2018-06-24: qty 1

## 2018-06-24 MED ORDER — SENNOSIDES-DOCUSATE SODIUM 8.6-50 MG PO TABS
2.0000 | ORAL_TABLET | Freq: Two times a day (BID) | ORAL | Status: DC
  Administered 2018-06-24 – 2018-06-25 (×3): 2 via ORAL
  Filled 2018-06-24 (×3): qty 2

## 2018-06-24 MED ORDER — SODIUM CHLORIDE 0.9 % IV SOLN
INTRAVENOUS | Status: DC
  Administered 2018-06-24: 09:00:00 via INTRAVENOUS

## 2018-06-24 MED ORDER — POLYETHYLENE GLYCOL 3350 17 G PO PACK
17.0000 g | PACK | Freq: Two times a day (BID) | ORAL | Status: DC
  Administered 2018-06-24 – 2018-06-25 (×2): 17 g via ORAL
  Filled 2018-06-24 (×2): qty 1

## 2018-06-24 MED ORDER — SODIUM CHLORIDE 0.9 % IV SOLN
INTRAVENOUS | Status: DC | PRN
  Administered 2018-06-24: 12:00:00

## 2018-06-24 MED ORDER — CLONIDINE HCL 0.1 MG PO TABS
0.1000 mg | ORAL_TABLET | Freq: Three times a day (TID) | ORAL | Status: DC
  Administered 2018-06-24 – 2018-06-25 (×4): 0.1 mg via ORAL
  Filled 2018-06-24 (×5): qty 1

## 2018-06-24 SURGICAL SUPPLY — 36 items
ADH SKN CLS APL DERMABOND .7 (GAUZE/BANDAGES/DRESSINGS) ×2
ARMBAND PINK RESTRICT EXTREMIT (MISCELLANEOUS) ×4 IMPLANT
CANISTER SUCT 3000ML PPV (MISCELLANEOUS) ×3 IMPLANT
CANNULA VESSEL 3MM 2 BLNT TIP (CANNULA) ×3 IMPLANT
CLIP VESOCCLUDE MED 6/CT (CLIP) ×3 IMPLANT
CLIP VESOCCLUDE SM WIDE 6/CT (CLIP) ×3 IMPLANT
COVER PROBE W GEL 5X96 (DRAPES) IMPLANT
COVER WAND RF STERILE (DRAPES) ×3 IMPLANT
DECANTER SPIKE VIAL GLASS SM (MISCELLANEOUS) ×1 IMPLANT
DERMABOND ADVANCED (GAUZE/BANDAGES/DRESSINGS) ×1
DERMABOND ADVANCED .7 DNX12 (GAUZE/BANDAGES/DRESSINGS) ×2 IMPLANT
DRAIN PENROSE 1/4X12 LTX STRL (WOUND CARE) ×3 IMPLANT
ELECT REM PT RETURN 9FT ADLT (ELECTROSURGICAL) ×3
ELECTRODE REM PT RTRN 9FT ADLT (ELECTROSURGICAL) ×2 IMPLANT
GLOVE BIO SURGEON STRL SZ7.5 (GLOVE) ×3 IMPLANT
GOWN STRL REUS W/ TWL LRG LVL3 (GOWN DISPOSABLE) ×6 IMPLANT
GOWN STRL REUS W/TWL LRG LVL3 (GOWN DISPOSABLE) ×9
GRAFT GORETEX STRT 4-7X45 (Vascular Products) ×2 IMPLANT
KIT BASIN OR (CUSTOM PROCEDURE TRAY) ×3 IMPLANT
KIT TURNOVER KIT B (KITS) ×3 IMPLANT
LOOP VESSEL MINI RED (MISCELLANEOUS) ×2 IMPLANT
NS IRRIG 1000ML POUR BTL (IV SOLUTION) ×3 IMPLANT
PACK CV ACCESS (CUSTOM PROCEDURE TRAY) ×3 IMPLANT
PAD ARMBOARD 7.5X6 YLW CONV (MISCELLANEOUS) ×6 IMPLANT
SPONGE SURGIFOAM ABS GEL 100 (HEMOSTASIS) IMPLANT
SUT PROLENE 6 0 BV (SUTURE) ×5 IMPLANT
SUT PROLENE 6 0 CC (SUTURE) ×4 IMPLANT
SUT PROLENE 7 0 BV 1 (SUTURE) ×1 IMPLANT
SUT VIC AB 3-0 SH 27 (SUTURE) ×3
SUT VIC AB 3-0 SH 27X BRD (SUTURE) ×2 IMPLANT
SUT VIC AB 4-0 PS2 18 (SUTURE) ×2 IMPLANT
SUT VIC AB 4-0 PS2 27 (SUTURE) ×2 IMPLANT
SUT VICRYL 4-0 PS2 18IN ABS (SUTURE) ×3 IMPLANT
TOWEL GREEN STERILE (TOWEL DISPOSABLE) ×3 IMPLANT
UNDERPAD 30X30 (UNDERPADS AND DIAPERS) ×3 IMPLANT
WATER STERILE IRR 1000ML POUR (IV SOLUTION) ×3 IMPLANT

## 2018-06-24 NOTE — Progress Notes (Signed)
[facility] ROUNDING NOTE   Subjective:   Mental status appears to have improved.  She is somnolent this morning.  She is scheduled for AV fistula creation appreciate help from Dr. Oneida Alar  Blood pressure 140/73 pulse 74 temperature 98.2 sats of 100% on room air  Sodium 138 potassium 3.5 chloride 101 CO2 23 BUN 80 creatinine 5.99 glucose 193 calcium 9.0 hemoglobin 8.2 WBC 10.1 platelets  Objective:  Vital signs in last 24 hours:  Temp:  [98 F (36.7 C)-98.3 F (36.8 C)] 98.2 F (36.8 C) (12/10 0833) Pulse Rate:  [55-70] 62 (12/10 0833) Resp:  [12-18] 12 (12/10 0833) BP: (109-140)/(59-73) 140/73 (12/10 0833) SpO2:  [98 %-100 %] 100 % (12/10 1601)  Weight change:  Filed Weights   06/15/18 1800  Weight: 87.2 kg    Intake/Output: I/O last 3 completed shifts: In: -  Out: 600 [Urine:600]   Intake/Output this shift:  No intake/output data recorded.   Chronically ill appearing lady awake alert and oriented no asterixis CVS- RRR RS- CTA ABD- BS present soft non-distended EXT- no edema left upper arm AV fistula nonpulsatile   Basic Metabolic Panel: Recent Labs  Lab 06/18/18 0811 06/19/18 0350 06/20/18 0255 06/21/18 0233 06/22/18 0305 06/23/18 0225 06/24/18 0306  NA 142 140 139 139 138 138 138  K 3.5 3.1* 3.5 3.3* 4.1 3.4* 3.5  CL 101 101 103 102 101 101 101  CO2 28 25 25 24 22 23 23   GLUCOSE 204* 169* 166* 141* 168* 180* 193*  BUN 85* 86* 84* 77* 82* 79* 80*  CREATININE 5.87* 5.70* 5.85* 5.71* 5.69* 5.83* 5.99*  CALCIUM 8.9 8.6* 8.7* 9.1 9.0 8.7* 9.0  MG  --  2.0 2.1  --   --   --   --   PHOS 5.5*  --   --   --   --   --   --     Liver Function Tests: Recent Labs  Lab 06/18/18 0811 06/19/18 0350  AST  --  25  ALT  --  27  ALKPHOS  --  57  BILITOT  --  0.9  PROT  --  5.4*  ALBUMIN 2.7* 2.6*   No results for input(s): LIPASE, AMYLASE in the last 168 hours. No results for input(s): AMMONIA in the last 168 hours.  CBC: Recent Labs  Lab  06/19/18 0350 06/20/18 0255 06/21/18 0233 06/22/18 0305 06/23/18 0225 06/24/18 0306  WBC 12.1* 13.8* 13.7* 14.7* 10.8* 10.1  NEUTROABS 9.4*  --   --   --   --   --   HGB 8.8* 8.8* 9.3* 9.5* 8.4* 8.2*  HCT 28.6* 29.1* 31.2* 30.5* 26.8* 26.1*  MCV 97.6 98.0 99.0 98.1 97.5 98.9  PLT 234 244 245 236 227 213    Cardiac Enzymes: Recent Labs  Lab 06/17/18 1151  CKTOTAL 503*    BNP: Invalid input(s): POCBNP  CBG: Recent Labs  Lab 06/23/18 1211 06/23/18 1546 06/23/18 2240 06/24/18 0836 06/24/18 0920  GLUCAP 220* 206* 200* 182* 165*    Microbiology: Results for orders placed or performed during the hospital encounter of 06/15/18  MRSA PCR Screening     Status: None   Collection Time: 06/15/18  8:00 PM  Result Value Ref Range Status   MRSA by PCR NEGATIVE NEGATIVE Final    Comment:        The GeneXpert MRSA Assay (FDA approved for NASAL specimens only), is one component of a comprehensive MRSA colonization surveillance program. It is  not intended to diagnose MRSA infection nor to guide or monitor treatment for MRSA infections. Performed at [facility], [place] [address], [place], [place] [address]   Culture, Urine     Status: Abnormal   Collection Time: 06/21/18  5:00 AM  Result Value Ref Range Status   Specimen Description URINE, CATHETERIZED  Final   Special Requests   Final    NONE Performed at [facility], [place] [address] [place], [place] [address]    Culture >=100,000 COLONIES/mL ENTEROCOCCUS FAECALIS (A)  Final   Report Status 06/23/2018 FINAL  Final   Organism ID, Bacteria ENTEROCOCCUS FAECALIS (A)  Final      Susceptibility   Enterococcus faecalis - MIC*    AMPICILLIN <=2 SENSITIVE Sensitive     LEVOFLOXACIN >=8 RESISTANT Resistant     NITROFURANTOIN <=16 SENSITIVE Sensitive     VANCOMYCIN 1 SENSITIVE Sensitive     * >=100,000 COLONIES/mL ENTEROCOCCUS FAECALIS  Culture, blood (routine x 2)     Status: None (Preliminary result)    Collection Time: 06/21/18 11:41 AM  Result Value Ref Range Status   Specimen Description BLOOD LEFT ANTECUBITAL  Final   Special Requests   Final    BOTTLES DRAWN AEROBIC AND ANAEROBIC Blood Culture adequate volume   Culture   Final    NO GROWTH 2 DAYS Performed at [facility], [place] [address] [place], [place] [address]    Report Status PENDING  Incomplete  Culture, blood (routine x 2)     Status: None (Preliminary result)   Collection Time: 06/21/18 12:11 PM  Result Value Ref Range Status   Specimen Description BLOOD BLOOD RIGHT HAND  Final   Special Requests   Final    BOTTLES DRAWN AEROBIC AND ANAEROBIC Blood Culture adequate volume   Culture   Final    NO GROWTH 2 DAYS Performed at [facility], [place] [address], [place], [place] [address]    Report Status PENDING  Incomplete    Coagulation Studies: No results for input(s): LABPROT, INR in the last 72 hours.  Urinalysis: No results for input(s): COLORURINE, LABSPEC, PHURINE, GLUCOSEU, HGBUR, BILIRUBINUR, KETONESUR, PROTEINUR, UROBILINOGEN, NITRITE, LEUKOCYTESUR in the last 72 hours.  Invalid input(s): APPERANCEUR    Imaging: Dg Abd Portable 1v  Result Date: 06/23/2018 CLINICAL DATA:  Abdominal distension EXAM: PORTABLE ABDOMEN - 1 VIEW COMPARISON:  CT 04/28/2018 FINDINGS: Surgical clips in the right upper quadrant. Nonobstructed bowel-gas pattern with moderate stool. Status post left hip replacement. IMPRESSION: Nonobstructed bowel gas pattern with moderate stool in the colon. Electronically Signed   By: Donavan Foil M.D.   On: 06/23/2018 21:02     Medications:   . [MAR Hold] sodium chloride 10 mL/hr at 06/16/18 2000  . sodium chloride 10 mL/hr at 06/24/18 0908  . sodium chloride    . [MAR Hold]  ceFAZolin (ANCEF) IV     . [MAR Hold] amoxicillin  500 mg Oral Q24H  . [MAR Hold] aspirin EC  81 mg Oral Daily  . [MAR Hold] cloNIDine  0.1 mg Oral TID  . [MAR Hold] donepezil  10 mg Oral QHS  . [MAR Hold]  DULoxetine  60 mg Oral Daily  . [MAR Hold] furosemide  40 mg Oral Daily  . [MAR Hold] gabapentin  100 mg Oral BID  . [MAR Hold] heparin injection (subcutaneous)  5,000 Units Subcutaneous Q8H  . [MAR Hold] insulin aspart  0-5 Units Subcutaneous QHS  . [facility] Hold]  insulin aspart  0-9 Units Subcutaneous TID WC  . [MAR Hold] isosorbide mononitrate  60 mg Oral Daily  . [MAR Hold] levothyroxine  125 mcg Oral Q0600  . [MAR Hold] mouth rinse  15 mL Mouth Rinse BID  . [MAR Hold] mirtazapine  7.5 mg Oral QHS  . [MAR Hold] polyethylene glycol  17 g Oral BID  . [MAR Hold] rosuvastatin  10 mg Oral Daily  . [MAR Hold] senna-docusate  2 tablet Oral BID  . [MAR Hold] sodium chloride flush  3 mL Intravenous Q12H   [MAR Hold] sodium chloride, [MAR Hold] acetaminophen, [MAR Hold] haloperidol lactate, [MAR Hold] HYDROcodone-acetaminophen, [MAR Hold] sodium chloride flush  Assessment/ Plan:   Acute on chronic kidney disease stage V secondary to diabetes diabetic nephropathy creatinine at the level of the mid 5 mg/dL.  With dialysis access planned for Tuesday, 06/24/2018.  Appears to be no indication for emergent dialysis.  Acute encephalopathy EEG without seizure activity carotid ultrasound without obvious stenosis MRI showing some acute/subacute posterior superior frontal lobe infarcts and right anterior frontal lobe motion artifact   Hypertensive urgency appears to be somewhat improved with clonidine 0.2 mg 3 times daily and isosorbide 60 mg daily.  She continues on Lasix 40 mg daily  Acute diastolic heart failure TTE at [facility] showed concentric LVH EF 65 to 70% without wall motion abnormality  N STEMI followed by primary team  Polypharmacy now narcotic use in the past now being treated with gabapentin  Urine culture 06/21/2018 shows enterococcus UTI appears to been started on Ancef will need to switch to sensitive organism.  Treating with oral amoxicillin     LOS: [place] @TODAY @10 :27  AM

## 2018-06-24 NOTE — Op Note (Signed)
Procedure: Left Upper Arm AV graft  Preop: ESRD  Postop: ESRD  Anesthesia: Local with sedation  Findings:4-7 mm PTFE end to side to axillary vein  Asst: Leontine Locket PA-C   Procedure Details: After adequate sedation, the left upper extremity was prepped and draped in usual sterile fashion. Local anesthesia was infiltrated in the antecubital crease and axilla.   A longitudinal incision was then made near the antecubital crease the left arm.  There were no suitable antecubital veins for outflow.   The incision was carried into the subcutaneous tissues down to level of the brachial artery.  There was some scar in this area from prior brachiocephalic fistula.  This was taken down and the vein ligated and divided adjacent to the prior anastomosis.  Next the brachial artery was dissected free in the medial portion incision. The artery was  3 mm in diameter. The vessel loops were placed proximal and distal to the planned site of arteriotomy. At this point, a longitudinal incision was made in the axilla and carried through the subcutaneous tissues and fascia to expose the axillary vein.  The nerves were protected.  The vein was approximately 4-5 mm in diameter. Next, a subcutaneous tunnel was created connecting the upper arm to the lower arm incision in an arcing configuration over the biceps muscle.  A 4-7 mm PTFE graft was then brought through this subcutaneous tunnel. The patient was given 5000 units of intravenous heparin. After appropriate circulation time, the vessel loops were used to control the artery. A longitudinal opening was made in the right brachial artery.  The 4 mm end of the graft was beveled and sewn end to side to the artery using a 6 0 prolene.  At completion of the anastomosis the artery was forward bled, backbled and thoroughly flushed.  The anastomosis was secured, vessel loops were released and there was palpable pulse in the graft.  The graft was clamped just above the arterial  anastomosis with a fistula clamp. The graft was then pulled taut to length at the axillary incision.  The axillary vein was controlled with a fine bulldog clamp in the upper axilla proximally and distally.  The vein was opened longitudinally.  The distal end of the graft was then beveled and sewn end to side to the vein using a running 6 0 prolene.  Just prior to completion of the anastomosis, everything was forward bled, back bled and thoroughly flushed.  The anastomosis was secured and the fistula clamp removed from the proximal graft.  A thrill was immediately palpable in the graft.  After hemostasis was obtained, the subcutaneous tissues were reapproximated using a running 3-0 Vicryl suture. The skin was then closed with a 4 0 Vicryl subcuticular stitch. Dermabond was applied to the skin incisions.  The patient tolerated the procedure well and there were no complications.  Instrument sponge and needle count was correct at the end of the case.  The patient was taken to the recovery room in stable condition.   Ruta Hinds, MD [facility] Office: [phone] Pager: [phone]

## 2018-06-24 NOTE — Progress Notes (Signed)
Speech Pathology cancellation due to procedure: pt out of room for AV fistula procedure.   [facility] L. Tivis Ringer, [place] Office number [phone] Pager [phone]

## 2018-06-24 NOTE — Telephone Encounter (Signed)
sch appt spk to pt daughter 07/10/18 1pm p/o PA

## 2018-06-24 NOTE — Transfer of Care (Signed)
Immediate Anesthesia Transfer of Care Note  Patient: [facility]  Procedure(s) Performed: INSERTION OF ARTERIOVENOUS (AV) GORE-TEX GRAFT LEFT UPPER ARM (Left Arm Upper)  Patient Location: PACU  Anesthesia Type:MAC  Level of Consciousness: drowsy and patient cooperative  Airway & Oxygen Therapy: Patient Spontanous Breathing and Patient connected to face mask oxygen  Post-op Assessment: Report given to RN and Post -op Vital signs reviewed and stable  Post vital signs: Reviewed and stable  Last Vitals:  Vitals Value Taken Time  BP    Temp    Pulse 67 06/24/2018 12:48 PM  Resp 11 06/24/2018 12:48 PM  SpO2 88 % 06/24/2018 12:48 PM  Vitals shown include unvalidated device data.  Last Pain:  Vitals:   06/24/18 0945  TempSrc:   PainSc: Asleep      Patients Stated Pain Goal: 2 (32/67/12 4580)  Complications: No apparent anesthesia complications

## 2018-06-24 NOTE — Progress Notes (Signed)
PROGRESS NOTE  Paige Arnold WUJ:[id number] DOB: 19-Aug-1940 DOA: 06/15/2018 PCP: Angelina Sheriff, MD   LOS: 9 days   Brief Narrative / Interim history: 77 y.o.femalew/ a hx of CKD stage IV, HTN, diabetes, obesity, GERD, osteoarthritis and chronic pain who reported progressive mild jerky movements concerning for possible seizures over a few weeks, followed by a 30-minute stage of general confusion. She was seen by her Neurologist who recommended an MRI and adjusted her medications (renal dosing) but her symptoms have gotten worse in the interim. Upon presentation to the ED she was agitated and confused. She was found to have a systolic of 956. CT head was normal. Exam noted no evidence of focal neurologic deficit. Her creatinine was 4.6 with a baseline creatinine of 3.6 about a month prior. Troponin steadily rose frome 0.3 > 0.9.  Cardiology recommended medical management with heparin and nitroglycerin for NSTEMI. Given her worsening renal fxn she was transferred to [facility].   Subjective: Continues to complain of back pain.  No nausea no vomiting.  No acute events overnight.  Seen after the surgery and tolerated very well.  Assessment & Plan: Principal Problem:   Metabolic encephalopathy Active Problems:   Diabetes mellitus type 2 with complications, uncontrolled (HCC)   Chronic kidney disease (CKD), stage IV (severe) (HCC)   Hypertensive urgency   Hypothyroidism   Acute metabolic encephalopathy   NSTEMI (non-ST elevated myocardial infarction) (HCC)   CVA (cerebral vascular accident) ([place])   Principal Problem Acute metabolic encephalopathy Likely combination of polypharmacy as well as hypertensive encephalopathy subacute CVA on MRI possible pres to severe high blood pressure  EEG obtained which showed generalized nonspecific cerebral dysfunction without seizure activity, very nonspecific Likely combination of polypharmacy as well as hypertensive encephalopathy. Discontinue  morphine. Transition to Norco to avoid morphine metabolites. Outpatient follow-up with pain management clinic  MRI is positive for acute stroke. Stroke team was consulted, currently recommend aspirin. Initial plan was to perform a TEE although currently they feel that this is all small vessel disease and therefore would not recommend any further work-up.  Blood cultures were ordered which are currently negative. Urine culture is positive for enterococcus and therefore will treat with oral amoxicillin.  Acute kidney injury on chronic kidney disease stage IV-V S/P left Upper Arm AV graf Baseline creatinine in the mid threes, currently in the 5 range.  Nephrology consulted, discussed with Dr. Hollie Salk, appreciate input and defer further management to nephrology Vascular surgery consulted for graft placement. Pt cleared per neurology.  Underwent procedure on 06/24/2018.  Monitor postoperative recovery  Type 2 diabetes mellitus, poorly controlled, with renal complication -Most recent A1c 7.5. -Continue sliding scale, hold home medications  Hypertensive urgency -Initial blood pressure at 220-240, status post Cardene drip utilized at [facility] on clonidine patch and other medications. Currently on clonidine 0.1 mg 3 times daily (dose reduced from 0.2 mg 3 times daily), Lasix 40 mg daily, Imdur 60 mg daily  Chronic pain -Has been on morphine 3 times daily for a long time.  Her morphine is likely toxic in the setting of worsening renal function given excellent response to Narcan Received Narcan twice 06/17/2018 Daughter is at bedside and extremely vocal regarding withdrawals and she would not want her mother to go through withdrawals.   Discontinue morphine. Transition to Norco and monitor.  Recommend to avoid morphine going forward. Discussed with daughter, would not increase the narcotics any further than every 6 hours of Norco. We will add gabapentin and  monitor.  Elevated  TSH -Continue home regimen and repeat TSH as an outpatient.  Acute diastolic CHF -Since admission at [facility] patient diuresed over 3 L of fluids, no previous heart history.  2D echo here normal LVEF.  Currently she is euvolemic  Dementia -Continue Aricept, apparently at baseline she is highly functional  Poor p.o. intake. Constipation. X-ray shows moderate stool burden. Continue bowel regimen. Remeron added 7.5 mg nightly  Scheduled Meds: . amoxicillin  500 mg Oral Q24H  . aspirin EC  81 mg Oral Daily  . cloNIDine  0.1 mg Oral TID  . donepezil  10 mg Oral QHS  . DULoxetine  60 mg Oral Daily  . fentaNYL      . furosemide  40 mg Oral Daily  . gabapentin  100 mg Oral BID  . heparin injection (subcutaneous)  5,000 Units Subcutaneous Q8H  . insulin aspart  0-5 Units Subcutaneous QHS  . insulin aspart  0-9 Units Subcutaneous TID WC  . isosorbide mononitrate  60 mg Oral Daily  . levothyroxine  125 mcg Oral Q0600  . mouth rinse  15 mL Mouth Rinse BID  . mirtazapine  7.5 mg Oral QHS  . polyethylene glycol  17 g Oral BID  . rosuvastatin  10 mg Oral Daily  . senna-docusate  2 tablet Oral BID  . sodium chloride flush  3 mL Intravenous Q12H   Continuous Infusions: . sodium chloride Stopped (06/24/18 1236)   PRN Meds:.sodium chloride, acetaminophen, haloperidol lactate, HYDROcodone-acetaminophen, sodium chloride flush  DVT prophylaxis: heparin Code Status: Full code Family Communication: Extensive discussion with daughter at bedside Disposition Plan: To SNF once medically stable postprocedure and improvement in p.o. intake.  Consultants:   Nephrology, vascular surgery, neurology  Procedures:   2D echo Impressions: - Study limited to parasternal views only. Patient would not cooperate with the rest of the study.  Left Upper Arm AV graf  Antimicrobials:  Amoxicillin  Objective: Vitals:   06/24/18 1318 06/24/18 1333 06/24/18 1348 06/24/18 1704  BP: 112/65 116/64 (!)  107/58 (!) 110/56  Pulse: 76 70 69 67  Resp: 12 14 15 12   Temp:   (!) 97.3 F (36.3 C) 98.1 F (36.7 C)  TempSrc:    Oral  SpO2: 95% 93% 93% 95%  Weight:      Height:        Intake/Output Summary (Last 24 hours) at 06/24/2018 1828 Last data filed at 06/24/2018 1500 Gross per 24 hour  Intake 302.33 ml  Output 360 ml  Net -57.67 ml   Filed Weights   06/15/18 1800  Weight: 87.2 kg    Examination:  Constitutional: Lethargic, alert after Narcan, interactive, Eyes: lids and conjunctivae normal ENMT: Mucous membranes are moist.  Neck: normal, supple Respiratory: clear to auscultation bilaterally, no wheezing, no crackles. Normal respiratory effort. No accessory muscle use.  Cardiovascular: Regular rate and rhythm, no murmurs / rubs / gallops. No LE edema. 2+ pedal pulses. No carotid bruits. Abdomen: no tenderness Musculoskeletal: no clubbing / cyanosis.  Skin: no rashes Neurologic: Nonfocal   Data Reviewed: I have independently reviewed following labs and imaging studies   CBC: Recent Labs  Lab 06/19/18 0350 06/20/18 0255 06/21/18 0233 06/22/18 0305 06/23/18 0225 06/24/18 0306  WBC 12.1* 13.8* 13.7* 14.7* 10.8* 10.1  NEUTROABS 9.4*  --   --   --   --   --   HGB 8.8* 8.8* 9.3* 9.5* 8.4* 8.2*  HCT 28.6* 29.1* 31.2* 30.5* 26.8* 26.1*  MCV 97.6  98.0 99.0 98.1 97.5 98.9  PLT 234 244 245 236 227 381   Basic Metabolic Panel: Recent Labs  Lab 06/18/18 0811 06/19/18 0350 06/20/18 0255 06/21/18 0233 06/22/18 0305 06/23/18 0225 06/24/18 0306  NA 142 140 139 139 138 138 138  K 3.5 3.1* 3.5 3.3* 4.1 3.4* 3.5  CL 101 101 103 102 101 101 101  CO2 28 25 25 24 22 23 23   GLUCOSE 204* 169* 166* 141* 168* 180* 193*  BUN 85* 86* 84* 77* 82* 79* 80*  CREATININE 5.87* 5.70* 5.85* 5.71* 5.69* 5.83* 5.99*  CALCIUM 8.9 8.6* 8.7* 9.1 9.0 8.7* 9.0  MG  --  2.0 2.1  --   --   --   --   PHOS 5.5*  --   --   --   --   --   --    GFR: Estimated Creatinine Clearance: 8.7 mL/min  (A) (by C-G formula based on SCr of 5.99 mg/dL (H)). Liver Function Tests: Recent Labs  Lab 06/18/18 0811 06/19/18 0350  AST  --  25  ALT  --  27  ALKPHOS  --  57  BILITOT  --  0.9  PROT  --  5.4*  ALBUMIN 2.7* 2.6*   No results for input(s): LIPASE, AMYLASE in the last 168 hours. No results for input(s): AMMONIA in the last 168 hours. Coagulation Profile: No results for input(s): INR, PROTIME in the last 168 hours. Cardiac Enzymes: No results for input(s): CKTOTAL, CKMB, CKMBINDEX, TROPONINI in the last 168 hours. BNP (last 3 results) No results for input(s): PROBNP in the last 8760 hours. HbA1C: No results for input(s): HGBA1C in the last 72 hours. CBG: Recent Labs  Lab 06/23/18 2240 06/24/18 0836 06/24/18 0920 06/24/18 1250 06/24/18 1706  GLUCAP 200* 182* 165* 173* 150*   Lipid Profile: No results for input(s): CHOL, HDL, LDLCALC, TRIG, CHOLHDL, LDLDIRECT in the last 72 hours. Thyroid Function Tests: No results for input(s): TSH, T4TOTAL, FREET4, T3FREE, THYROIDAB in the last 72 hours. Anemia Panel: No results for input(s): VITAMINB12, FOLATE, FERRITIN, TIBC, IRON, RETICCTPCT in the last 72 hours. Urine analysis:    Component Value Date/Time   COLORURINE YELLOW 06/16/2018 [place] 06/16/2018 1035   LABSPEC 1.013 06/16/2018 1035   PHURINE 6.0 06/16/2018 1035   GLUCOSEU 50 (A) 06/16/2018 1035   HGBUR MODERATE (A) 06/16/2018 1035   BILIRUBINUR NEGATIVE 06/16/2018 1035   KETONESUR NEGATIVE 06/16/2018 1035   PROTEINUR >=300 (A) 06/16/2018 1035   UROBILINOGEN 0.2 03/09/2014 1331   NITRITE NEGATIVE 06/16/2018 1035   LEUKOCYTESUR NEGATIVE 06/16/2018 1035   Sepsis Labs: Invalid input(s): PROCALCITONIN, LACTICIDVEN  Recent Results (from the past 240 hour(s))  MRSA PCR Screening     Status: None   Collection Time: 06/15/18  8:00 PM  Result Value Ref Range Status   MRSA by PCR NEGATIVE NEGATIVE Final    Comment:        The GeneXpert MRSA Assay  (FDA approved for NASAL specimens only), is one component of a comprehensive MRSA colonization surveillance program. It is not intended to diagnose MRSA infection nor to guide or monitor treatment for MRSA infections. Performed at [facility], [place] [address], [place], [place] [address]   Culture, Urine     Status: Abnormal   Collection Time: 06/21/18  5:00 AM  Result Value Ref Range Status   Specimen Description URINE, CATHETERIZED  Final   Special Requests   Final    NONE  Performed at [facility], [place] [address], [place], [place] [address]    Culture >=100,000 COLONIES/mL ENTEROCOCCUS FAECALIS (A)  Final   Report Status 06/23/2018 FINAL  Final   Organism ID, Bacteria ENTEROCOCCUS FAECALIS (A)  Final      Susceptibility   Enterococcus faecalis - MIC*    AMPICILLIN <=2 SENSITIVE Sensitive     LEVOFLOXACIN >=8 RESISTANT Resistant     NITROFURANTOIN <=16 SENSITIVE Sensitive     VANCOMYCIN 1 SENSITIVE Sensitive     * >=100,000 COLONIES/mL ENTEROCOCCUS FAECALIS  Culture, blood (routine x 2)     Status: None (Preliminary result)   Collection Time: 06/21/18 11:41 AM  Result Value Ref Range Status   Specimen Description BLOOD LEFT ANTECUBITAL  Final   Special Requests   Final    BOTTLES DRAWN AEROBIC AND ANAEROBIC Blood Culture adequate volume   Culture   Final    NO GROWTH 3 DAYS Performed at [facility], [place] [address] [place], [place] [address]    Report Status PENDING  Incomplete  Culture, blood (routine x 2)     Status: None (Preliminary result)   Collection Time: 06/21/18 12:11 PM  Result Value Ref Range Status   Specimen Description BLOOD BLOOD RIGHT HAND  Final   Special Requests   Final    BOTTLES DRAWN AEROBIC AND ANAEROBIC Blood Culture adequate volume   Culture   Final    NO GROWTH 3 DAYS Performed at [facility], [place] [address] [place], [place] [address]    Report Status PENDING  Incomplete      Radiology Studies: Dg Abd  Portable 1v  Result Date: 06/23/2018 CLINICAL DATA:  Abdominal distension EXAM: PORTABLE ABDOMEN - 1 VIEW COMPARISON:  CT 04/28/2018 FINDINGS: Surgical clips in the right upper quadrant. Nonobstructed bowel-gas pattern with moderate stool. Status post left hip replacement. IMPRESSION: Nonobstructed bowel gas pattern with moderate stool in the colon. Electronically Signed   By: Donavan Foil M.D.   On: 06/23/2018 21:02    Time spent: 40 minutes,   Author:  Berle Mull, MD [facility] Pager: [phone] 06/24/2018 6:28 PM     If 7PM-7AM, please contact night-coverage [email or web address] Password [email or web address] 06/24/2018, 6:28 PM

## 2018-06-24 NOTE — Interval H&P Note (Signed)
History and Physical Interval Note:  06/24/2018 10:21 AM  Paige Arnold  has presented today for surgery, with the diagnosis of chronic kidney disease  The various methods of treatment have been discussed with the patient and family. After consideration of risks, benefits and other options for treatment, the patient has consented to  Procedure(s): ARTERIOVENOUS (AV) FISTULA CREATION ARM (Left) as a surgical intervention .  The patient's history has been reviewed, patient examined, no change in status, stable for surgery.  I have reviewed the patient's chart and labs.  Questions were answered to the patient's satisfaction.     Ruta Hinds

## 2018-06-24 NOTE — Anesthesia Procedure Notes (Signed)
Procedure Name: MAC Date/Time: 06/24/2018 11:05 AM Performed by: Candis Shine, CRNA Pre-anesthesia Checklist: Patient identified, Suction available and Patient being monitored Patient Re-evaluated:Patient Re-evaluated prior to induction Oxygen Delivery Method: Simple face mask Dental Injury: Teeth and Oropharynx as per pre-operative assessment

## 2018-06-24 NOTE — Anesthesia Preprocedure Evaluation (Signed)
Anesthesia Evaluation  Patient identified by MRN, date of birth, ID band Patient awake    Reviewed: Allergy & Precautions, NPO status , Patient's Chart, lab work & pertinent test results  History of Anesthesia Complications Negative for: history of anesthetic complications  Airway Mallampati: III  TM Distance: >3 FB Neck ROM: Full    Dental  (+) Dental Advisory Given   Pulmonary shortness of breath and with exertion, sleep apnea ,    breath sounds clear to auscultation       Cardiovascular hypertension, Pt. on medications (-) angina+ Past MI  (-) dysrhythmias  Rhythm:Regular     Neuro/Psych Seizures -,  ? Seizures and subacute stroke on MRI, Neurology cleared patient for surgery   Neuromuscular disease CVA, No Residual Symptoms negative psych ROS   GI/Hepatic GERD  Medicated and Controlled,(+)     substance abuse  ,   Endo/Other  diabetesHypothyroidism Morbid obesity  Renal/GU CRFRenal disease     Musculoskeletal  (+) Arthritis , narcotic dependent  Abdominal   Peds  Hematology  (+) anemia ,   Anesthesia Other Findings   Reproductive/Obstetrics                             Anesthesia Physical Anesthesia Plan  ASA: IV  Anesthesia Plan: MAC   Post-op Pain Management:    Induction:   PONV Risk Score and Plan: 2 and Treatment may vary due to age or medical condition and Propofol infusion  Airway Management Planned: Nasal Cannula  Additional Equipment:   Intra-op Plan:   Post-operative Plan:   Informed Consent: I have reviewed the patients History and Physical, chart, labs and discussed the procedure including the risks, benefits and alternatives for the proposed anesthesia with the patient or authorized representative who has indicated his/her understanding and acceptance.   Dental advisory given  Plan Discussed with: CRNA and Surgeon  Anesthesia Plan Comments:          Anesthesia Quick Evaluation

## 2018-06-24 NOTE — Discharge Instructions (Signed)
° °  [facility]  Discharge Instructions  AV Fistula or Graft Surgery for Dialysis Access  Please refer to the following instructions for your post-procedure care. Your surgeon or physician assistant will discuss any changes with you.  Activity  You may drive the day following your surgery, if you are comfortable and no longer taking prescription pain medication. Resume full activity as the soreness in your incision resolves.  Bathing/Showering  You may shower after you go home. Keep your incision dry for 48 hours. Do not soak in a bathtub, hot tub, or swim until the incision heals completely. You may not shower if you have a hemodialysis catheter.  Incision Care  Clean your incision with mild soap and water after 48 hours. Pat the area dry with a clean towel. You do not need a bandage unless otherwise instructed. Do not apply any ointments or creams to your incision. You may have skin glue on your incision. Do not peel it off. It will come off on its own in about one week. Your arm may swell a bit after surgery. To reduce swelling use pillows to elevate your arm so it is above your heart. Your doctor will tell you if you need to lightly wrap your arm with an ACE bandage.  Diet  Resume your normal diet. There are not special food restrictions following this procedure. In order to heal from your surgery, it is CRITICAL to get adequate nutrition. Your body requires vitamins, minerals, and protein. Vegetables are the best source of vitamins and minerals. Vegetables also provide the perfect balance of protein. Processed food has little nutritional value, so try to avoid this.  Medications  Resume taking all of your medications. If your incision is causing pain, you may take over-the counter pain relievers such as acetaminophen (Tylenol). If you were prescribed a stronger pain medication, please be aware these medications can cause nausea and constipation. Prevent  nausea by taking the medication with a snack or meal. Avoid constipation by drinking plenty of fluids and eating foods with high amount of fiber, such as fruits, vegetables, and grains.  Do not take Tylenol if you are taking prescription pain medications.  Follow up Your surgeon may want to see you in the office following your access surgery. If so, this will be arranged at the time of your surgery.  Please call us immediately for any of the following conditions:  Increased pain, redness, drainage (pus) from your incision site Fever of 101 degrees or higher Severe or worsening pain at your incision site Hand pain or numbness.  Reduce your risk of vascular disease:  Stop smoking. If you would like help, call [facility] at 1-800-QUIT-NOW (419) 023-7152) or [place] at [place] your cholesterol Maintain a desired weight Control your diabetes Keep your blood pressure down  Dialysis  It will take several weeks to several months for your new dialysis access to be ready for use. Your surgeon will determine when it is okay to use it. Your nephrologist will continue to direct your dialysis. You can continue to use your Permcath until your new access is ready for use.   06/24/2018 Paige Arnold [id number] 11-Mar-1941  Surgeon(s): Fields, Jessy Oto, MD  Procedure(s): INSERTION OF ARTERIOVENOUS (AV) GORE-TEX GRAFT LEFT UPPER ARM  x Do not stick graft for 4 weeks    If you have any questions, please call the office at [phone].

## 2018-06-24 NOTE — Telephone Encounter (Signed)
-----   Message from Gabriel Earing, [place] sent at 06/24/2018 12:29 PM EST ----- S/p LUA AVG 06/24/18.  F/u with PA schedule on Dr. Oneida Alar clinic day 2 weeks Thanks

## 2018-06-25 ENCOUNTER — Other Ambulatory Visit (HOSPITAL_COMMUNITY): Payer: Self-pay

## 2018-06-25 ENCOUNTER — Encounter (HOSPITAL_COMMUNITY): Payer: Self-pay | Admitting: Vascular Surgery

## 2018-06-25 LAB — CBC
HEMATOCRIT: 27.5 % — AB (ref 36.0–46.0)
Hemoglobin: 8.4 g/dL — ABNORMAL LOW (ref 12.0–15.0)
MCH: 30.5 pg (ref 26.0–34.0)
MCHC: 30.5 g/dL (ref 30.0–36.0)
MCV: 100 fL (ref 80.0–100.0)
Platelets: 232 10*3/uL (ref 150–400)
RBC: 2.75 MIL/uL — ABNORMAL LOW (ref 3.87–5.11)
RDW: 14.8 % (ref 11.5–15.5)
WBC: 9.2 10*3/uL (ref 4.0–10.5)
nRBC: 0 % (ref 0.0–0.2)

## 2018-06-25 LAB — RENAL FUNCTION PANEL
Albumin: 2.6 g/dL — ABNORMAL LOW (ref 3.5–5.0)
Anion gap: 13 (ref 5–15)
BUN: 77 mg/dL — ABNORMAL HIGH (ref 8–23)
CO2: 24 mmol/L (ref 22–32)
CREATININE: 6.07 mg/dL — AB (ref 0.44–1.00)
Calcium: 9 mg/dL (ref 8.9–10.3)
Chloride: 103 mmol/L (ref 98–111)
GFR calc Af Amer: 7 mL/min — ABNORMAL LOW (ref >60)
GFR calc non Af Amer: 6 mL/min — ABNORMAL LOW (ref >60)
Glucose, Bld: 139 mg/dL — ABNORMAL HIGH (ref 70–99)
POTASSIUM: 3.7 mmol/L (ref 3.5–5.1)
Phosphorus: 6 mg/dL — ABNORMAL HIGH (ref 2.5–4.6)
Sodium: 140 mmol/L (ref 135–145)

## 2018-06-25 LAB — IRON AND TIBC
Iron: 33 ug/dL (ref 28–170)
Saturation Ratios: 14 % (ref 10.4–31.8)
TIBC: 237 ug/dL — ABNORMAL LOW (ref 250–450)
UIBC: 204 ug/dL

## 2018-06-25 LAB — GLUCOSE, CAPILLARY
GLUCOSE-CAPILLARY: 151 mg/dL — AB (ref 70–99)
Glucose-Capillary: 141 mg/dL — ABNORMAL HIGH (ref 70–99)
Glucose-Capillary: 162 mg/dL — ABNORMAL HIGH (ref 70–99)

## 2018-06-25 LAB — MAGNESIUM: Magnesium: 2.1 mg/dL (ref 1.7–2.4)

## 2018-06-25 MED ORDER — SUCROFERRIC OXYHYDROXIDE 500 MG PO CHEW: 500.0000 mg | CHEWABLE_TABLET | Freq: Three times a day (TID) | ORAL | Status: DC

## 2018-06-25 MED ORDER — SENNOSIDES-DOCUSATE SODIUM 8.6-50 MG PO TABS: 1.0000 | ORAL_TABLET | Freq: Two times a day (BID) | ORAL | Status: DC

## 2018-06-25 MED ORDER — ISOSORBIDE MONONITRATE ER 60 MG PO TB24: 60.0000 mg | ORAL_TABLET | Freq: Every day | ORAL | Status: AC

## 2018-06-25 MED ORDER — ASPIRIN 81 MG PO TBEC: 81.0000 mg | DELAYED_RELEASE_TABLET | Freq: Every day | ORAL | Status: AC

## 2018-06-25 MED ORDER — GABAPENTIN 100 MG PO CAPS: 100.0000 mg | ORAL_CAPSULE | Freq: Two times a day (BID) | ORAL | Status: AC

## 2018-06-25 MED ORDER — POLYETHYLENE GLYCOL 3350 17 G PO PACK: 17.0000 g | PACK | Freq: Every day | ORAL | 0 refills | Status: AC

## 2018-06-25 MED ORDER — DARBEPOETIN ALFA 100 MCG/0.5ML IJ SOSY
100.0000 ug | PREFILLED_SYRINGE | INTRAMUSCULAR | Status: DC
  Filled 2018-06-25: qty 0.5

## 2018-06-25 MED ORDER — SUCROFERRIC OXYHYDROXIDE 500 MG PO CHEW
500.0000 mg | CHEWABLE_TABLET | Freq: Three times a day (TID) | ORAL | Status: DC
  Administered 2018-06-25 (×2): 500 mg via ORAL
  Filled 2018-06-25 (×2): qty 1

## 2018-06-25 MED ORDER — HYDROCODONE-ACETAMINOPHEN 5-325 MG PO TABS: 1.0000 | ORAL_TABLET | Freq: Four times a day (QID) | ORAL | 0 refills | Status: DC | PRN

## 2018-06-25 NOTE — Clinical Social Work Placement (Signed)
   CLINICAL SOCIAL WORK PLACEMENT  NOTE  Date:  06/25/2018  Patient Details  Name: Paige Arnold MRN: [id number] Date of Birth: [birth year]  Clinical Social Work is seeking post-discharge placement for this patient at the [place] level of care (*CSW will initial, date and re-position this form in  chart as items are completed):      Patient/family provided with [place] Work Department's list of facilities offering this level of care within the geographic area requested by the patient (or if unable, by the patient's family).  Yes   Patient/family informed of their freedom to choose among providers that offer the needed level of care, that participate in Medicare, Medicaid or managed care program needed by the patient, have an available bed and are willing to accept the patient.      Patient/family informed of 's ownership interest in [facility] and [facility], as well as of the fact that they are under no obligation to receive care at these facilities.  PASRR submitted to EDS on       PASRR number received on 06/18/18     Existing PASRR number confirmed on       FL2 transmitted to all facilities in geographic area requested by pt/family on 06/18/18     FL2 transmitted to all facilities within larger geographic area on       Patient informed that his/her managed care company has contracts with or will negotiate with certain facilities, including the following:        Yes   Patient/family informed of bed offers received.  Patient chooses bed at [place]     Physician recommends and patient chooses bed at      Patient to be transferred to [facility] on 06/25/18.  Patient to be transferred to facility by [facility]     Patient family notified on 06/25/18 of transfer.  Name of family member notified:  Ivin Booty     PHYSICIAN       Additional Comment:    _______________________________________________ Eileen Stanford, LCSW 06/25/2018, 1:47 PM

## 2018-06-25 NOTE — Progress Notes (Addendum)
[facility] [place]  Subjective  - Sore from surgery.   Objective (!) 108/52 68 98.1 F (36.7 C) (Oral) 20 98%  Intake/Output Summary (Last 24 hours) at 06/25/2018 0814 Last data filed at 06/25/2018 0600 Gross per 24 hour  Intake 602.33 ml  Output 660 ml  Net -57.67 ml    Left UE AV graft with healing incisions, ecchymosis and palpable thrill in graft. Palpable radial pulse with 5/5 grip equal B UE.  Assessment/Planning: POD # 1 Left UE AV graft  May stick graft in 4 weeks. F/U PRN  Roxy Horseman 06/25/2018 8:14 AM -- Agree with above.  Needs follow up outpt in 2 weeks Will sign off  Ruta Hinds, MD [facility] [place]: [phone] Pager: [phone] Laboratory Lab Results: Recent Labs    06/24/18 0306 06/25/18 0349  WBC 10.1 9.2  HGB 8.2* 8.4*  HCT 26.1* 27.5*  PLT 213 232   BMET Recent Labs    06/24/18 0306 06/25/18 0349  NA 138 140  K 3.5 3.7  CL 101 103  CO2 23 24  GLUCOSE 193* 139*  BUN 80* 77*  CREATININE 5.99* 6.07*  CALCIUM 9.0 9.0    COAG No results found for: INR, PROTIME No results found for: PTT

## 2018-06-25 NOTE — Clinical Social Work Note (Signed)
Clinical Social Worker facilitated patient discharge including contacting patient family and facility to confirm patient discharge plans.  Clinical information faxed to facility and family agreeable with plan.  CSW arranged ambulance transport via [facility] to [facility] in [place].  RN to call [phone] for report prior to discharge.  Clinical Social Worker will sign off for now as social work intervention is no longer needed. Please consult us again if new need arises.  Tricia Pledger, LCSWA [phone]\ 

## 2018-06-25 NOTE — Progress Notes (Addendum)
[facility] ROUNDING NOTE   Subjective:   Mental status appears to have improved.  She is somnolent this morning.  Underwent placement of AV graft may cannulate in 4 weeks placed 06/24/2018 appreciate assistance from Dr. Oneida Alar  Blood pressure 131/60 pulse 70 temperature 98 O2 sats 100% room air  Sodium 140 potassium 3.7 chloride 103 CO2 24 glucose 139 BUN 77 creatinine 6.07 calcium 9.0 phosphorus 6.0 magnesium 2.1 albumin 2.6 WBC 9.2 hemoglobin 8.4 platelets 232  Objective:  Vital signs in last 24 hours:  Temp:  [97.3 F (36.3 C)-98.1 F (36.7 C)] 98 F (36.7 C) (12/11 0800) Pulse Rate:  [67-76] 70 (12/11 0800) Resp:  [9-20] 18 (12/11 0800) BP: (97-131)/(51-65) 131/60 (12/11 0800) SpO2:  [93 %-100 %] 100 % (12/11 0800)  Weight change:  Filed Weights   06/15/18 1800  Weight: 87.2 kg    Intake/Output: I/O last 3 completed shifts: In: 602.3 [P.O.:300; I.V.:302.3] Out: 1010 [Urine:1000; Blood:10]   Intake/Output this shift:  No intake/output data recorded.   Chronically ill appearing lady awake alert and oriented no asterixis CVS- RRR RS- CTA ABD- BS present soft non-distended EXT-AV graft with thrill and bruit   Basic Metabolic Panel: Recent Labs  Lab 06/19/18 0350 06/20/18 0255 06/21/18 0233 06/22/18 0305 06/23/18 0225 06/24/18 0306 06/25/18 0349  NA 140 139 139 138 138 138 140  K 3.1* 3.5 3.3* 4.1 3.4* 3.5 3.7  CL 101 103 102 101 101 101 103  CO2 25 25 24 22 23 23 24   GLUCOSE 169* 166* 141* 168* 180* 193* 139*  BUN 86* 84* 77* 82* 79* 80* 77*  CREATININE 5.70* 5.85* 5.71* 5.69* 5.83* 5.99* 6.07*  CALCIUM 8.6* 8.7* 9.1 9.0 8.7* 9.0 9.0  MG 2.0 2.1  --   --   --   --  2.1  PHOS  --   --   --   --   --   --  6.0*    Liver Function Tests: Recent Labs  Lab 06/19/18 0350 06/25/18 0349  AST 25  --   ALT 27  --   ALKPHOS 57  --   BILITOT 0.9  --   PROT 5.4*  --   ALBUMIN 2.6* 2.6*   No results for input(s): LIPASE, AMYLASE in the  last 168 hours. No results for input(s): AMMONIA in the last 168 hours.  CBC: Recent Labs  Lab 06/19/18 0350  06/21/18 0233 06/22/18 0305 06/23/18 0225 06/24/18 0306 06/25/18 0349  WBC 12.1*   < > 13.7* 14.7* 10.8* 10.1 9.2  NEUTROABS 9.4*  --   --   --   --   --   --   HGB 8.8*   < > 9.3* 9.5* 8.4* 8.2* 8.4*  HCT 28.6*   < > 31.2* 30.5* 26.8* 26.1* 27.5*  MCV 97.6   < > 99.0 98.1 97.5 98.9 100.0  PLT 234   < > 245 236 227 213 232   < > = values in this interval not displayed.    Cardiac Enzymes: No results for input(s): CKTOTAL, CKMB, CKMBINDEX, TROPONINI in the last 168 hours.  BNP: Invalid input(s): POCBNP  CBG: Recent Labs  Lab 06/24/18 0920 06/24/18 1250 06/24/18 1706 06/24/18 2114 06/25/18 0828  GLUCAP 165* 173* 150* 146* 141*    Microbiology: Results for orders placed or performed during the hospital encounter of 06/15/18  MRSA PCR Screening     Status: None   Collection Time: 06/15/18  8:00 PM  Result  Value Ref Range Status   MRSA by PCR NEGATIVE NEGATIVE Final    Comment:        The GeneXpert MRSA Assay (FDA approved for NASAL specimens only), is one component of a comprehensive MRSA colonization surveillance program. It is not intended to diagnose MRSA infection nor to guide or monitor treatment for MRSA infections. Performed at [facility], [place] [address], [place], [place] [address]   Culture, Urine     Status: Abnormal   Collection Time: 06/21/18  5:00 AM  Result Value Ref Range Status   Specimen Description URINE, CATHETERIZED  Final   Special Requests   Final    NONE Performed at [facility], [place] [address], [place], [place] [address]    Culture >=100,000 COLONIES/mL ENTEROCOCCUS FAECALIS (A)  Final   Report Status 06/23/2018 FINAL  Final   Organism ID, Bacteria ENTEROCOCCUS FAECALIS (A)  Final      Susceptibility   Enterococcus faecalis - MIC*    AMPICILLIN <=2 SENSITIVE Sensitive     LEVOFLOXACIN >=8 RESISTANT  Resistant     NITROFURANTOIN <=16 SENSITIVE Sensitive     VANCOMYCIN 1 SENSITIVE Sensitive     * >=100,000 COLONIES/mL ENTEROCOCCUS FAECALIS  Culture, blood (routine x 2)     Status: None (Preliminary result)   Collection Time: 06/21/18 11:41 AM  Result Value Ref Range Status   Specimen Description BLOOD LEFT ANTECUBITAL  Final   Special Requests   Final    BOTTLES DRAWN AEROBIC AND ANAEROBIC Blood Culture adequate volume   Culture   Final    NO GROWTH 3 DAYS Performed at [facility], [place] [address], [place], [place] [address]    Report Status PENDING  Incomplete  Culture, blood (routine x 2)     Status: None (Preliminary result)   Collection Time: 06/21/18 12:11 PM  Result Value Ref Range Status   Specimen Description BLOOD BLOOD RIGHT HAND  Final   Special Requests   Final    BOTTLES DRAWN AEROBIC AND ANAEROBIC Blood Culture adequate volume   Culture   Final    NO GROWTH 3 DAYS Performed at [facility], [place] [address] [place], [place] [address]    Report Status PENDING  Incomplete    Coagulation Studies: No results for input(s): LABPROT, INR in the last 72 hours.  Urinalysis: No results for input(s): COLORURINE, LABSPEC, PHURINE, GLUCOSEU, HGBUR, BILIRUBINUR, KETONESUR, PROTEINUR, UROBILINOGEN, NITRITE, LEUKOCYTESUR in the last 72 hours.  Invalid input(s): APPERANCEUR    Imaging: Dg Abd Portable 1v  Result Date: 06/23/2018 CLINICAL DATA:  Abdominal distension EXAM: PORTABLE ABDOMEN - 1 VIEW COMPARISON:  CT 04/28/2018 FINDINGS: Surgical clips in the right upper quadrant. Nonobstructed bowel-gas pattern with moderate stool. Status post left hip replacement. IMPRESSION: Nonobstructed bowel gas pattern with moderate stool in the colon. Electronically Signed   By: Donavan Foil M.D.   On: 06/23/2018 21:02     Medications:   . sodium chloride Stopped (06/24/18 1236)   . aspirin EC  81 mg Oral Daily  . cloNIDine  0.1 mg Oral TID  . donepezil  10 mg Oral QHS   . DULoxetine  60 mg Oral Daily  . furosemide  40 mg Oral Daily  . gabapentin  100 mg Oral BID  . heparin injection (subcutaneous)  5,000 Units Subcutaneous Q8H  . insulin aspart  0-5 Units Subcutaneous QHS  . insulin aspart  0-9 Units Subcutaneous TID WC  . isosorbide mononitrate  60 mg Oral Daily  . levothyroxine  125 mcg Oral Q0600  . mouth rinse  15 mL Mouth Rinse BID  . mirtazapine  7.5 mg Oral QHS  . polyethylene glycol  17 g Oral BID  . rosuvastatin  10 mg Oral Daily  . senna-docusate  2 tablet Oral BID  . sodium chloride flush  3 mL Intravenous Q12H   sodium chloride, acetaminophen, haloperidol lactate, HYDROcodone-acetaminophen, sodium chloride flush  Assessment/ Plan:   Acute on chronic kidney disease stage V secondary to diabetes diabetic nephropathy creatinine at the level of the mid 5 mg/dL.  With dialysis access placed 06/24/2018 there appears to be no indication for emergent dialysis.  Acute encephalopathy EEG without seizure activity carotid ultrasound without obvious stenosis MRI showing some acute/subacute posterior superior frontal lobe infarcts and right anterior frontal lobe motion artifact   Hypertensive urgency appears to be somewhat improved with clonidine 0.2 mg 3 times daily and isosorbide 60 mg daily.  She continues on Lasix 40 mg daily  Acute diastolic heart failure TTE at [facility] showed concentric LVH EF 65 to 70% without wall motion abnormality  N STEMI followed by primary team  Polypharmacy now narcotic use in the past now being treated with gabapentin  Urine culture 06/21/2018 shows enterococcus UTI appears to been started on Ancef will need to switch to sensitive organism.  Treated with oral amoxicillin  Anemia.  We will check iron studies and administer darbepoetin  Bones some hypophosphatemia need PTH and start Velphoro 500 mg with meals   Patient is stable for discharge from renal standpoint but will need close follow-up with [facility] in 2 weeks  LOS: [place] @TODAY @10 :33 AM

## 2018-06-25 NOTE — Progress Notes (Signed)
Occupational Therapy Treatment Patient Details Name: Paige Arnold MRN: [id number] DOB: 1941/04/30 Today's Date: 06/25/2018    History of present illness 77 y.o. female with medical history significant of chronic kidney disease stage IV, hypertension, diabetes, morbid obesity, GERD, osteoarthritis and chronic pain who has had progressive mild jerky movements concerning for possible seizures over the last few weeks in addition to AMS. Patient presents now with metabolic encephalopathy, work up underway.   OT comments  Pt making progress. Pt participated in acitivities with increased time and effort. Pt asking therapist to totally assist with hygiene and to cut up food, therapist encouraged pt to attempt herself first before asking for assistance. Pt mabulted to bathroom and stood at sink for hygiene tasks. OT will continue to follow acutely  Follow Up Recommendations  SNF;Supervision/Assistance - 24 hour    Equipment Recommendations  None recommended by OT    Recommendations for Other Services      Precautions / Restrictions Precautions Precautions: Fall Restrictions Weight Bearing Restrictions: No       Mobility Bed Mobility Overal bed mobility: Needs Assistance Bed Mobility: Supine to Sit     Supine to sit: HOB elevated;Min guard        Transfers Overall transfer level: Needs assistance Equipment used: Rolling walker (2 wheeled) Transfers: Sit to/from Stand Sit to Stand: Min guard         General transfer comment:  slow and labored.     Balance Overall balance assessment: Needs assistance Sitting-balance support: No upper extremity supported Sitting balance-Leahy Scale: Fair     Standing balance support: During functional activity;Single extremity supported Standing balance-Leahy Scale: Fair                             ADL either performed or assessed with clinical judgement   ADL Overall ADL's : Needs assistance/impaired Eating/Feeding:  Sitting;Set up Eating/Feeding Details (indicate cue type and reason): pt required assist to cut up chicken breast and open drink container due to weakness Grooming: Wash/dry hands;Wash/dry face;Standing;Min guard                   Armed forces technical officer: Min guard;Ambulation;RW;Comfort height toilet;Regular Toilet;Grab bars;BSC   Toileting- Clothing Manipulation and Hygiene: Minimal assistance;Sit to/from stand         General ADL Comments: pt participated in acitivities with increased time and effort. Pt asking therapist to totally assist with hygiene and to cut up food, therapist encouraged pt to attempt herself first before asking for assistance     Vision Patient Visual Report: No change from baseline     Perception     Praxis      Cognition Arousal/Alertness: Awake/alert Behavior During Therapy: WFL for tasks assessed/performed Overall Cognitive Status: Within Functional Limits for tasks assessed                                          Exercises     Shoulder Instructions       General Comments      Pertinent Vitals/ Pain       Pain Assessment: Faces Faces Pain Scale: Hurts little more Pain Location: back Pain Descriptors / Indicators: Aching Pain Intervention(s): Monitored during session;Repositioned  Home Living  Prior Functioning/Environment              Frequency  Min 2X/week        Progress Toward Goals  OT Goals(current goals can now be found in the care plan section)  Progress towards OT goals: Progressing toward goals     Plan Discharge plan remains appropriate    Co-evaluation                 AM-PAC OT "6 Clicks" Daily Activity     Outcome Measure   Help from another person eating meals?: A Little Help from another person taking care of personal grooming?: A Little Help from another person toileting, which includes using toliet, bedpan, or  urinal?: A Lot Help from another person bathing (including washing, rinsing, drying)?: A Lot Help from another person to put on and taking off regular upper body clothing?: A Little Help from another person to put on and taking off regular lower body clothing?: A Lot 6 Click Score: 15    End of Session Equipment Utilized During Treatment: Gait belt;Rolling walker;Other (comment)(BSC)  OT Visit Diagnosis: Unsteadiness on feet (R26.81);Other abnormalities of gait and mobility (R26.89);Other symptoms and signs involving cognitive function;Cognitive communication deficit (R41.841)   Activity Tolerance Patient limited by fatigue   Patient Left with family/visitor present;with call bell/phone within reach;in chair   Nurse Communication          Time: 0352-4818 OT Time Calculation (min): 38 min  Charges: OT General Charges $OT Visit: 1 Visit OT Treatments $Self Care/Home Management : 23-37 mins $Therapeutic Activity: 8-22 mins     Britt Bottom 06/25/2018, 1:18 PM

## 2018-06-25 NOTE — Anesthesia Postprocedure Evaluation (Signed)
Anesthesia Post Note  Patient: Paige Arnold  Procedure(s) Performed: INSERTION OF ARTERIOVENOUS (AV) GORE-TEX GRAFT LEFT UPPER ARM (Left Arm Upper)     Patient location during evaluation: PACU Anesthesia Type: MAC Level of consciousness: awake and alert Pain management: pain level controlled Vital Signs Assessment: post-procedure vital signs reviewed and stable Respiratory status: spontaneous breathing, nonlabored ventilation, respiratory function stable and patient connected to nasal cannula oxygen Cardiovascular status: stable and blood pressure returned to baseline Postop Assessment: no apparent nausea or vomiting Anesthetic complications: no    Last Vitals:  Vitals:   06/24/18 2305 06/25/18 0800  BP: (!) 108/52 131/60  Pulse: 68 70  Resp: 20 18  Temp: 36.7 C 36.7 C  SpO2: 98% 100%    Last Pain:  Vitals:   06/25/18 1114  TempSrc:   PainSc: 10-Worst pain ever                 Parsa Rickett

## 2018-06-25 NOTE — Discharge Summary (Signed)
Physician Discharge Summary  Paige Arnold LAG:[id number] DOB: 1940/11/11 DOA: 06/15/2018  PCP: Angelina Sheriff, MD  Admit date: 06/15/2018 Discharge date: 06/25/2018  Time spent: 35 minutes  Recommendations for Outpatient Follow-up:  -PCP in 1 week -VERY IMPORTANT: Nephrology FU with Dr.Dunham in 2weeks, please call [facility] if you do not hear from their office in 1 week -Vascular FU with Dr.Fields in 2 weeks -Neurology Dr. Leonie Man in 4 weeks  Discharge Diagnoses:  Principal Problem:   Metabolic encephalopathy Active Problems:   Diabetes mellitus type 2 with complications, uncontrolled ([place])   Chronic kidney disease (CKD), stage IV (severe) (HCC)   Hypertensive urgency   Hypothyroidism   Acute metabolic encephalopathy   NSTEMI (non-ST elevated myocardial infarction) ([place])   CVA (cerebral vascular accident) [facility])    Discharge Condition: stable  Diet recommendation: DM heart healthy  Filed Weights   06/15/18 1800  Weight: 87.2 kg    History of present illness:  77 y.o.femalew/ a hx of CKDstage IV, HTN, diabetes, obesity, GERD, osteoarthritis and chronic pain whoreportedprogressive mild jerky movements concerning for possible seizures overafew weeks,followed by a 30-minute stage of general confusion. She was seen by herNeurologist whorecommendedan MRI and adjusted her medications (renal dosing) buther symptoms have gotten worsein the interim. Upon presentation to the EDshe was agitated and confused. She wasfound to have asystolicof240. CTheadwas normal.Exam noted no evidence of focal neurologic deficit. Her creatinine was 4.6  Hospital Course:   Acute metabolic encephalopathy -due to combination of polypharmacy, subacute CVA on MRI, UTI and less likely uremia -She was on high-dose oral morphine and gabapentin at baseline, stopped morphine she was started on much lower dose of Norco as needed for her severe chronic pain and gabapentin  dose was cut down considerably -Patient improved with this, EEG was negative for epileptiform activity -Improved, alert awake oriented x4, advised and educated regarding need to cut down and limit sedating medications given worsening kidney function  Acute stroke -MRI done for encephalopathy showed several small foci of subacute versus acute infarction in posterior superior frontal lobes, right anterior lateral frontal lobe, right putamen, right occipital lobe -With resultant encephalopathy and mild left-sided weakness, now improved -Carotid duplex was unremarkable, 2D echo showed preserved EF -LDL was 77, hemoglobin A1c was 7.5 -PT/OT eval completed, SNF recommended -Started on aspirin 81 mg daily -Neurology follow-up with Dr. Leonie Man in 4 weeks  Enterococcal urinary tract infection -Completed antibiotic course for this  Acute kidney injury on chronic kidney disease stage IV-V -Baseline creatinine was 3.5 range -On admission creatinine was in the 5 range, nephrology was consulted, renal did not feel that she was uremic, considering mental status improved with adjustment in medications -Despite significantly elevated creatinine she continued to have relatively good urine output, response to diuretics, did not have hyperkalemia severe acidosis or uremia hence no indication for urgent dialysis -Vascular surgery was consulted she underwent AV graft placement yesterday on 12/10 -Outpatient follow-up with nephrology in 2 weeks she sees Dr. Jamal Maes with [facility], per Dr. Justin Mend office will call patient with appointment -Anticipate she will likely need to start dialysis in the next 1 to 2 months -Discharged on Lasix 80mg  BID  Type 2 diabetes mellitus, poorly controlled, with renal complication -Most recent A1c 7.5. -Stopped glimepiride and Actos, CBGs improved in the low 100s with worsening creatinine  Hypertensive urgency -Initial blood pressure at 220-240, status  post Cardene drip utilized at [place] -Currently on clonidine, Lasix and Imdur, stable  Chronic pain syndrome -Has been chronically on morphine sulfate 15 mg 3 times daily for a long time, admitted with metabolic encephalopathy, due to worsening kidney function and decreased renal clearance we discontinued her morphine  -She was treated with Narcan multiple times this admission  -Due to severity of her chronic pain she was started on low-dose Norco PRN  -Gabapentin dose reduced from 300 mg twice daily to 100 mg twice daily   Elevated TSH -Continue home regimen and repeat TSH as an outpatient.  Acute diastolic CHF -  2D echo here normal LVEF.  Currently she is euvolemic -Continue oral Lasix, will need dialysis down the road  Dementia -Continue Aricept, apparently at baseline she is highly functional  Procedures: Procedure: Left Upper Arm AV graft on 12/10 by Dr.Fields  Consultations:  REnal Dr.Upton and Dr.Webb  Vascular Dr.Fields  Neuro Dr.Sethi  Discharge Exam: Vitals:   06/24/18 2305 06/25/18 0800  BP: (!) 108/52 131/60  Pulse: 68 70  Resp: 20 18  Temp: 98.1 F (36.7 C) 98 F (36.7 C)  SpO2: 98% 100%    General: AAOx3 Cardiovascular: S1S2/RRR Respiratory: CTAB  Discharge Instructions   Discharge Instructions    Discharge instructions   Complete by:  As directed    Renal, diabetic diet   Increase activity slowly   Complete by:  As directed      Allergies as of 06/25/2018      Reactions   Oxycodone Other (See Comments)   [place] Record - Per pain clinic Dr, "pt to no take, has taken too long"   Sulfa Antibiotics Itching      Medication List    STOP taking these medications   cetirizine 10 MG tablet Commonly known as:  ZYRTEC   glimepiride 4 MG tablet Commonly known as:  AMARYL   morphine 15 MG tablet Commonly known as:  MSIR   OVER THE COUNTER MEDICATION   pioglitazone 15 MG tablet Commonly known as:  ACTOS   propranolol 20 MG  tablet Commonly known as:  INDERAL   ranitidine 150 MG tablet Commonly known as:  ZANTAC     TAKE these medications   acetaminophen 500 MG tablet Commonly known as:  TYLENOL Take 1,000 mg by mouth every 6 (six) hours as needed for moderate pain or headache.   aspirin 81 MG EC tablet Take 1 tablet (81 mg total) by mouth daily. Start taking on:  06/26/2018   CENTRUM SILVER PO Take 1 tablet by mouth daily.   cloNIDine 0.1 MG tablet Commonly known as:  CATAPRES Take 0.1 mg by mouth 2 (two) times daily.   [employer] NASAL ALLERGY SPRAY 55 MCG/ACT Aero nasal inhaler Generic drug:  triamcinolone Place 2 sprays into the nose daily as needed (allergies).   diclofenac sodium 1 % Gel Commonly known as:  VOLTAREN Apply 2 g topically 4 (four) times daily.   donepezil 10 MG tablet Commonly known as:  ARICEPT Take 1 tablet (10 mg total) by mouth at bedtime.   DULoxetine 60 MG capsule Commonly known as:  CYMBALTA Take 60 mg by mouth daily.   Fish Oil 1000 MG Caps Take 1,000 mg by mouth 2 (two) times daily.   furosemide 80 MG tablet Commonly known as:  LASIX Take 80 mg by mouth 2 (two) times daily.   gabapentin 100 MG capsule Commonly known as:  NEURONTIN Take 1 capsule (100 mg total) by mouth 2 (two) times daily. What changed:    medication strength  how much to  take   GAVISCON 80-14.2 MG Chew Generic drug:  Alum Hydroxide-Mag Trisilicate Chew 1 tablet by mouth daily as needed (heartburn).   HYDROcodone-acetaminophen 5-325 MG tablet Commonly known as:  NORCO/VICODIN Take 1 tablet by mouth every 6 (six) hours as needed for severe pain.   isosorbide mononitrate 60 MG 24 hr tablet Commonly known as:  IMDUR Take 1 tablet (60 mg total) by mouth daily. Start taking on:  06/26/2018   levothyroxine 125 MCG tablet Commonly known as:  SYNTHROID, LEVOTHROID Take 125 mcg by mouth daily before breakfast.   lidocaine 5 % Commonly known as:  LIDODERM Place 1 patch onto the skin  daily. Remove & Discard patch within 12 hours or as directed by MD What changed:    when to take this  reasons to take this   polyethylene glycol packet Commonly known as:  MIRALAX / GLYCOLAX Take 17 g by mouth daily.   rosuvastatin 10 MG tablet Commonly known as:  CRESTOR Take 10 mg by mouth daily.   senna-docusate 8.6-50 MG tablet Commonly known as:  Senokot-S Take 1 tablet by mouth 2 (two) times daily.   sucroferric oxyhydroxide 500 MG chewable tablet Commonly known as:  VELPHORO Chew 1 tablet (500 mg total) by mouth 3 (three) times daily with meals.   TRULICITY 1.5 GD/9.2EQ Sopn Generic drug:  Dulaglutide Inject 1.5 mg into the skin every Sunday.   vitamin B-12 1000 MCG tablet Commonly known as:  CYANOCOBALAMIN Take 1,000 mcg by mouth daily.      Allergies  Allergen Reactions  . Oxycodone Other (See Comments)    [place] Record - Per pain clinic Dr, "pt to no take, has taken too long"  . Sulfa Antibiotics Itching   Follow-up Information    [facility] In 2 weeks.   Specialty:  Vascular Surgery Why:  Office will call you to arrange your appt (sent) Contact information: [place] [phone]       Jamal Maes, MD. Schedule an appointment as soon as possible for a visit in 2 week(s).   Specialty:  Nephrology Contact information: [place] [address] [phone]            The results of significant diagnostics from this hospitalization (including imaging, microbiology, ancillary and laboratory) are listed below for reference.    Significant Diagnostic Studies: Mr Virgel Paling XK Contrast  Result Date: 06/20/2018 CLINICAL DATA:  77 y/o  F; stroke for follow-up. EXAM: MRA HEAD WITHOUT CONTRAST TECHNIQUE: Angiographic images of the Circle of Willis were obtained using MRA technique without intravenous contrast. COMPARISON:  06/20/2018 MRI head. FINDINGS: Severe motion  artifact. Limited assessment for stenosis, small and medial vessel occlusion, aneurysm, or vascular malformation. Flow related signal is grossly seen within the internal cerebral arteries, dominant left vertebral artery, basilar artery, as well as the bilateral M1, A1, P1. IMPRESSION: Severe motion artifact, discussion above. Consider repeat MRA or CTA of head when patient is able to hold still. Electronically Signed   By: Kristine Garbe M.D.   On: 06/20/2018 21:23   Mr Brain Wo Contrast  Result Date: 06/20/2018 CLINICAL DATA:  77 y/o F; multiple falls and progressive worsening jerking movements. Concern for seizures. Possible side effect of daily motion. EXAM: MRI HEAD WITHOUT CONTRAST TECHNIQUE: Multiplanar, multiecho pulse sequences of the brain and surrounding structures were obtained without intravenous contrast. COMPARISON:  06/19/2018 an 06/18/2018 MRI head. FINDINGS: Brain: Small foci of reduced diffusion are present in  the posterosuperior frontal lobes, right anterolateral frontal lobe, right putamen, and the right occipital lobe. Complete corpus callosum and vermis. Normal pituitary gland. No cortical dysplasia, heterotopia, or disorder of cortical formation identified. No extra-axial collection, hydrocephalus, mass effect, or herniation. Few stable nonspecific T2 FLAIR hyperintensities in subcortical and periventricular white matter are compatible with mild chronic microvascular ischemic changes for age. Mild stable volume loss of the brain. Punctate focus of susceptibility hypointensity is present in the right superomedial frontal parietal junction without signal correlate on additional sequences compatible with hemosiderin deposition of chronic microhemorrhage. Hippocampi are symmetric in size and signal bilaterally. Vascular: Normal flow voids. Skull and upper cervical spine: Normal marrow signal. Sinuses/Orbits: Negative.  Bilateral intra-ocular lens replacement. Other: None. IMPRESSION:  1. Several small foci of reduced diffusion are present in the posterosuperior frontal lobes, right anterolateral frontal lobe, right putamen, right occipital lobe. Findings likely represent areas of acute/early subacute infarction and/or atypical for seizure related activity. Newly visible punctate lesions were likely obscured by motion artifact on the 06/18/2018 MRI of the head. 2. No structural cause of seizure identified. 3. Stable mild chronic microvascular ischemic changes and volume loss of the brain. Electronically Signed   By: Kristine Garbe M.D.   On: 06/20/2018 13:56   Mr Brain Wo Contrast  Result Date: 06/19/2018 CLINICAL DATA:  77 y/o F; multiple falls and progressive worsening jerking movements with concern for seizures. EXAM: MRI HEAD WITHOUT CONTRAST TECHNIQUE: Coronal T2 thin and axial T2 blade sequences were acquired. The patient could not hold still for the study and additional sequences were not acquired. COMPARISON:  06/18/2018 and 04/28/2013 MRI of the head. 06/14/2018 CT head. FINDINGS: Motion degraded axial and coronal T2 weighted sequences. Small focus of T2 hyperintensity is present in the right posterosuperior frontal lobe corresponding to suspected acute/early subacute infarction on the prior MRI of the head. Several stable nonspecific T2 hyperintensities in subcortical and periventricular white matter are compatible with mild chronic microvascular ischemic changes for age. Stable volume loss of the brain. No new focal mass effect, hydrocephalus, or herniation. No abnormal T2 signal of the paranasal sinuses or mastoid air cells. Bilateral intra-ocular lens replacement. Hyperostosis frontalis interna. No acute abnormality of scalp or curve ileum is evident on T2 weighted sequences. IMPRESSION: 1. Severe motion artifact of acquired coronal and axial T2 weighted sequences. Suboptimal assessment for structural causes of seizure. Additional sequences could not be acquired.  Consider repeat MRI when patient is able to hold still. 2. Small focus of hyperintensity in the right posterosuperior frontal lobe corresponding to the suspected acute/early subacute infarction on the prior MRI of the head. Electronically Signed   By: Kristine Garbe M.D.   On: 06/19/2018 22:56   Mr Brain Wo Contrast  Result Date: 06/18/2018 CLINICAL DATA:  77 y/o F; progressive mild jerking movements with concern for possible seizures. Altered mental status. Metabolic encephalopathy. EXAM: MRI HEAD WITHOUT CONTRAST TECHNIQUE: Axial DWI and axial T2 FLAIR sequences were acquired. The patient declined to continue the examination and additional sequences were not acquired. COMPARISON:  06/14/2018 CT head.  04/28/2013 MRI head. FINDINGS: Approximately 1 cm foci of reduced diffusion are present within the posterosuperior frontal lobes bilaterally (series 5 image 88 and 91). Additional possible punctate focus of reduced diffusion within the right anterolateral frontal lobe (series 5, image 84). Foci of reduced diffusion demonstrate associated increased T2 FLAIR hyperintense signal abnormality and are new from the prior MRI of the brain. Several nonspecific T2 FLAIR hyperintensities in subcortical  and periventricular white matter are compatible with mild chronic microvascular ischemic changes for age. Mild volume loss of the brain. No mass effect, appreciable extra-axial collection, hydrocephalus, or herniation. No abnormal diffusion or T2 FLAIR signal abnormality of calvarium, orbits, sinuses, or mastoid air cells. IMPRESSION: 1. Exam limited to DWI and T2 FLAIR sequences. 2. Approximately 1 cm foci of reduced diffusion in the posterosuperior frontal lobes and possible additional punctate focus in the right anterior frontal lobe. Findings probably represent acute/early subacute infarctions. Findings are atypical for seizure related activity. 3. Mild chronic microvascular ischemic changes and volume loss of  the brain. These results will be called to the ordering clinician or representative by the Radiologist Assistant, and communication documented in the PACS or zVision Dashboard. Electronically Signed   By: Kristine Garbe M.D.   On: 06/18/2018 17:09   US Renal  Result Date: 06/17/2018 CLINICAL DATA:  Chronic kidney disease EXAM: RENAL / URINARY TRACT ULTRASOUND COMPLETE COMPARISON:  CT abdomen pelvis 04/28/2018 FINDINGS: Right Kidney: Renal measurements: 12.1 x 4.4 x 5.2 cm = volume: 144 mL. Right kidney difficult to visualize due to increased echogenicity of the cortex and body habitus. Negative for mass or hydronephrosis. Left Kidney: Renal measurements: 8.5 x 4.5 x 3.8 cm = volume: 75 mL. Increased echogenicity of the left renal cortex. Echogenic suprarenal mass on the left measures 3 x 3.5 cm. This is similar to the prior CT which showed a fatty lesion immediately above the left kidney, separate from the adrenal gland. Negative for hydronephrosis. Bladder: Foley catheter IMPRESSION: 1. Negative for hydronephrosis 2. Small left kidney. Increased echogenicity of the renal cortex bilaterally 3. 3 x 3.5 cm suprarenal mass on the left unchanged from prior CT. This also is unchanged from CT of 07/26/2014. 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 Electronically Signed   By: Franchot Gallo M.D.   On: 06/17/2018 11:20   Dg Abd Portable 1v  Result Date: 06/23/2018 CLINICAL DATA:  Abdominal distension EXAM: PORTABLE ABDOMEN - 1 VIEW COMPARISON:  CT 04/28/2018 FINDINGS: Surgical clips in the right upper quadrant. Nonobstructed bowel-gas pattern with moderate stool. Status post left hip replacement. IMPRESSION: Nonobstructed bowel gas pattern with moderate stool in the colon. Electronically Signed   By: Donavan Foil M.D.   On: 06/23/2018 21:02   Vas US Carotid (at [place] Only)  Result Date: 06/17/2018 Carotid Arterial Duplex Study Indications: CVA. Limitations: Patient unable to  cooperate for optimal imaging Performing Technologist: Toma Copier RVS  Examination Guidelines: A complete evaluation includes B-mode imaging, spectral Doppler, color Doppler, and power Doppler as needed of all accessible portions of each vessel. Bilateral testing is considered an integral part of a complete examination. Limited examinations for reoccurring indications may be performed as noted.  Right Carotid Findings: +----------+--------+--------+--------+--------+-----------------------+           PSV cm/sEDV cm/sStenosisDescribeComments                +----------+--------+--------+--------+--------+-----------------------+ CCA Prox  90      4                       mild intimal thickening +----------+--------+--------+--------+--------+-----------------------+ CCA Distal67      10                      mild intimal thickening +----------+--------+--------+--------+--------+-----------------------+ ICA Prox  40      6  mild intimal thickening +----------+--------+--------+--------+--------+-----------------------+ ICA Distal45      9                       tortuous                +----------+--------+--------+--------+--------+-----------------------+ ECA       96      10                                              +----------+--------+--------+--------+--------+-----------------------+ +----------+--------+-------+--------+-------------------+           PSV cm/sEDV cmsDescribeArm Pressure (mmHG) +----------+--------+-------+--------+-------------------+ [id number]                                         +----------+--------+-------+--------+-------------------+ +---------+--------+--+--------+--+ VertebralPSV cm/s45EDV cm/s10 +---------+--------+--+--------+--+ Technically difficult due to patient being unable to cooperate for optimal imaging  Left Carotid Findings:  +----------+--------+--------+--------+--------+--------------------------+           PSV cm/sEDV cm/sStenosisDescribeComments                   +----------+--------+--------+--------+--------+--------------------------+ CCA Prox  74      17                      mild intimal thickening    +----------+--------+--------+--------+--------+--------------------------+ CCA Distal68      12                      minimal intimal thickening +----------+--------+--------+--------+--------+--------------------------+ ICA Prox  60      11                                                 +----------+--------+--------+--------+--------+--------------------------+ ICA Distal93      15                      tortuous                   +----------+--------+--------+--------+--------+--------------------------+ ECA       84      3                                                  +----------+--------+--------+--------+--------+--------------------------+ +----------+--------+--------+--------+-------------------+ SubclavianPSV cm/sEDV cm/sDescribeArm Pressure (mmHG) +----------+--------+--------+--------+-------------------+           165                                         +----------+--------+--------+--------+-------------------+ +---------+--------+--+--------+-+ VertebralPSV cm/s42EDV cm/s7 +---------+--------+--+--------+-+ Technically difficult due to patient being unable to cooperate for optimal imaging  Summary: Right Carotid: There is no obvious evidence of stenosis in the right ICA. Left Carotid: There is no obvious evidence of stenosis in the left ICA. Vertebrals:  Bilateral vertebral arteries demonstrate antegrade flow. Subclavians: Normal flow hemodynamics were seen in bilateral subclavian  arteries. *See table(s) above for measurements and observations.  Electronically signed by Antony Contras MD on 06/17/2018 at 8:03:01 AM.    Final    Vas [place] Upper  Ext Vein Mapping (pre-op Avf)  Result Date: 06/22/2018 UPPER EXTREMITY VEIN MAPPING  Indications: Pre-access. History: CKD STAGE V.  Limitations: Multiple IV on sites and patient discomfort. Comparison Study: No prior study available Performing Technologist: Hongying Cole  Examination Guidelines: A complete evaluation includes B-mode imaging, spectral Doppler, color Doppler, and power Doppler as needed of all accessible portions of each vessel. Bilateral testing is considered an integral part of a complete examination. Limited examinations for reoccurring indications may be performed as noted. +-----------------+-------------+----------+----------+ Right Cephalic   Diameter (cm)Depth (cm) Findings  +-----------------+-------------+----------+----------+ Shoulder             0.36        1.03              +-----------------+-------------+----------+----------+ Prox upper arm       0.40        0.97              +-----------------+-------------+----------+----------+ Mid upper arm        0.42        0.83   branching  +-----------------+-------------+----------+----------+ Dist upper arm       0.43        0.53   branching  +-----------------+-------------+----------+----------+ Antecubital fossa    0.37        0.70              +-----------------+-------------+----------+----------+ Prox forearm                            IV on site +-----------------+-------------+----------+----------+ +-----------------+-------------+----------+------------------------+ Right Basilic    Diameter (cm)Depth (cm)        Findings         +-----------------+-------------+----------+------------------------+ Shoulder             0.49        1.28                            +-----------------+-------------+----------+------------------------+ Prox upper arm       0.48        1.10                            +-----------------+-------------+----------+------------------------+ Mid upper arm         0.31        1.25          branching         +-----------------+-------------+----------+------------------------+ Dist upper arm       0.33        1.16          branching         +-----------------+-------------+----------+------------------------+ Antecubital fossa    0.28        1.29          branching         +-----------------+-------------+----------+------------------------+ Prox forearm         0.21        0.73   branching and IV on site +-----------------+-------------+----------+------------------------+ +-----------------+-------------+----------+----------+ Left Cephalic    Diameter (cm)Depth (cm) Findings  +-----------------+-------------+----------+----------+ Shoulder             0.27        0.72              +-----------------+-------------+----------+----------+  Prox upper arm       0.34        0.47   branching  +-----------------+-------------+----------+----------+ Mid upper arm        0.20        0.37   branching  +-----------------+-------------+----------+----------+ Dist upper arm       0.22        0.45   branching  +-----------------+-------------+----------+----------+ Antecubital fossa                       Thrombosed +-----------------+-------------+----------+----------+ Prox forearm                            Thrombosed +-----------------+-------------+----------+----------+ +-----------------+-------------+----------+--------------+ Left Basilic     Diameter (cm)Depth (cm)   Findings    +-----------------+-------------+----------+--------------+ Shoulder             0.83        1.77                  +-----------------+-------------+----------+--------------+ Prox upper arm       0.49        1.53                  +-----------------+-------------+----------+--------------+ Mid upper arm                             Thrombosed   +-----------------+-------------+----------+--------------+ Dist upper arm                             Thrombosed   +-----------------+-------------+----------+--------------+ Antecubital fossa                       unable to exam +-----------------+-------------+----------+--------------+ Prox forearm                            unable to exam +-----------------+-------------+----------+--------------+ *See table(s) above for measurements and observations.  Diagnosing physician: Harold Barban MD Electronically signed by Harold Barban MD on 06/22/2018 at 8:23:42 AM.    Final     Microbiology: Recent Results (from the past 240 hour(s))  MRSA PCR Screening     Status: None   Collection Time: 06/15/18  8:00 PM  Result Value Ref Range Status   MRSA by PCR NEGATIVE NEGATIVE Final    Comment:        The GeneXpert MRSA Assay (FDA approved for NASAL specimens only), is one component of a comprehensive MRSA colonization surveillance program. It is not intended to diagnose MRSA infection nor to guide or monitor treatment for MRSA infections. Performed at [facility], [place] [address] [place], [place] [address]   Culture, Urine     Status: Abnormal   Collection Time: 06/21/18  5:00 AM  Result Value Ref Range Status   Specimen Description URINE, CATHETERIZED  Final   Special Requests   Final    NONE Performed at [facility], [place] [address] [place], [place] [address]    Culture >=100,000 COLONIES/mL ENTEROCOCCUS FAECALIS (A)  Final   Report Status 06/23/2018 FINAL  Final   Organism ID, Bacteria ENTEROCOCCUS FAECALIS (A)  Final      Susceptibility   Enterococcus faecalis - MIC*    AMPICILLIN <=2 SENSITIVE Sensitive     LEVOFLOXACIN >=8 RESISTANT Resistant  NITROFURANTOIN <=16 SENSITIVE Sensitive     VANCOMYCIN 1 SENSITIVE Sensitive     * >=100,000 COLONIES/mL ENTEROCOCCUS FAECALIS  Culture, blood (routine x 2)     Status: None (Preliminary result)   Collection Time: 06/21/18 11:41 AM  Result Value Ref Range Status   Specimen Description BLOOD  LEFT ANTECUBITAL  Final   Special Requests   Final    BOTTLES DRAWN AEROBIC AND ANAEROBIC Blood Culture adequate volume   Culture   Final    NO GROWTH 3 DAYS Performed at [facility], [place] [address], [place], [place] [address]    Report Status PENDING  Incomplete  Culture, blood (routine x 2)     Status: None (Preliminary result)   Collection Time: 06/21/18 12:11 PM  Result Value Ref Range Status   Specimen Description BLOOD BLOOD RIGHT HAND  Final   Special Requests   Final    BOTTLES DRAWN AEROBIC AND ANAEROBIC Blood Culture adequate volume   Culture   Final    NO GROWTH 3 DAYS Performed at [facility], [place] [address], [place], [place] [address]    Report Status PENDING  Incomplete     Labs: Basic Metabolic Panel: Recent Labs  Lab 06/19/18 0350 06/20/18 0255 06/21/18 0233 06/22/18 0305 06/23/18 0225 06/24/18 0306 06/25/18 0349  NA 140 139 139 138 138 138 140  K 3.1* 3.5 3.3* 4.1 3.4* 3.5 3.7  CL 101 103 102 101 101 101 103  CO2 25 25 24 22 23 23 24   GLUCOSE 169* 166* 141* 168* 180* 193* 139*  BUN 86* 84* 77* 82* 79* 80* 77*  CREATININE 5.70* 5.85* 5.71* 5.69* 5.83* 5.99* 6.07*  CALCIUM 8.6* 8.7* 9.1 9.0 8.7* 9.0 9.0  MG 2.0 2.1  --   --   --   --  2.1  PHOS  --   --   --   --   --   --  6.0*   Liver Function Tests: Recent Labs  Lab 06/19/18 0350 06/25/18 0349  AST 25  --   ALT 27  --   ALKPHOS 57  --   BILITOT 0.9  --   PROT 5.4*  --   ALBUMIN 2.6* 2.6*   No results for input(s): LIPASE, AMYLASE in the last 168 hours. No results for input(s): AMMONIA in the last 168 hours. CBC: Recent Labs  Lab 06/19/18 0350  06/21/18 0233 06/22/18 0305 06/23/18 0225 06/24/18 0306 06/25/18 0349  WBC 12.1*   < > 13.7* 14.7* 10.8* 10.1 9.2  NEUTROABS 9.4*  --   --   --   --   --   --   HGB 8.8*   < > 9.3* 9.5* 8.4* 8.2* 8.4*  HCT 28.6*   < > 31.2* 30.5* 26.8* 26.1* 27.5*  MCV 97.6   < > 99.0 98.1 97.5 98.9 100.0  PLT 234   < > 245 236 227 213 232    < > = values in this interval not displayed.   Cardiac Enzymes: No results for input(s): CKTOTAL, CKMB, CKMBINDEX, TROPONINI in the last 168 hours. BNP: BNP (last 3 results) No results for input(s): BNP in the last 8760 hours.  ProBNP (last 3 results) No results for input(s): PROBNP in the last 8760 hours.  CBG: Recent Labs  Lab 06/24/18 1250 06/24/18 1706 06/24/18 2114 06/25/18 0828 06/25/18 1150  GLUCAP 173* 150* 146* 141* 151*       Signed:  Domenic Polite MD.  [facility] 06/25/2018, 11:59 AM

## 2018-06-26 LAB — CULTURE, BLOOD (ROUTINE X 2)
Culture: NO GROWTH
Culture: NO GROWTH
Special Requests: ADEQUATE
Special Requests: ADEQUATE

## 2018-06-26 LAB — PARATHYROID HORMONE, INTACT (NO CA): PTH: 40 pg/mL (ref 15–65)

## 2018-07-04 ENCOUNTER — Telehealth: Payer: Self-pay

## 2018-07-04 ENCOUNTER — Encounter: Payer: No Typology Code available for payment source | Attending: Registered Nurse | Admitting: Registered Nurse

## 2018-07-04 DIAGNOSIS — Z96642 Presence of left artificial hip joint: Secondary | ICD-10-CM | POA: Insufficient documentation

## 2018-07-04 DIAGNOSIS — Z9049 Acquired absence of other specified parts of digestive tract: Secondary | ICD-10-CM | POA: Insufficient documentation

## 2018-07-04 DIAGNOSIS — M25562 Pain in left knee: Secondary | ICD-10-CM | POA: Insufficient documentation

## 2018-07-04 DIAGNOSIS — M545 Low back pain: Secondary | ICD-10-CM | POA: Insufficient documentation

## 2018-07-04 DIAGNOSIS — M4800 Spinal stenosis, site unspecified: Secondary | ICD-10-CM | POA: Insufficient documentation

## 2018-07-04 DIAGNOSIS — E079 Disorder of thyroid, unspecified: Secondary | ICD-10-CM | POA: Insufficient documentation

## 2018-07-04 DIAGNOSIS — Z811 Family history of alcohol abuse and dependence: Secondary | ICD-10-CM | POA: Insufficient documentation

## 2018-07-04 DIAGNOSIS — K219 Gastro-esophageal reflux disease without esophagitis: Secondary | ICD-10-CM | POA: Insufficient documentation

## 2018-07-04 DIAGNOSIS — G4730 Sleep apnea, unspecified: Secondary | ICD-10-CM | POA: Insufficient documentation

## 2018-07-04 DIAGNOSIS — Z818 Family history of other mental and behavioral disorders: Secondary | ICD-10-CM | POA: Insufficient documentation

## 2018-07-04 DIAGNOSIS — Z833 Family history of diabetes mellitus: Secondary | ICD-10-CM | POA: Insufficient documentation

## 2018-07-04 DIAGNOSIS — Z8249 Family history of ischemic heart disease and other diseases of the circulatory system: Secondary | ICD-10-CM | POA: Insufficient documentation

## 2018-07-04 DIAGNOSIS — G8929 Other chronic pain: Secondary | ICD-10-CM | POA: Insufficient documentation

## 2018-07-04 DIAGNOSIS — I10 Essential (primary) hypertension: Secondary | ICD-10-CM | POA: Insufficient documentation

## 2018-07-04 DIAGNOSIS — N289 Disorder of kidney and ureter, unspecified: Secondary | ICD-10-CM | POA: Insufficient documentation

## 2018-07-04 DIAGNOSIS — I770 Arteriovenous fistula, acquired: Secondary | ICD-10-CM | POA: Insufficient documentation

## 2018-07-04 DIAGNOSIS — Z9889 Other specified postprocedural states: Secondary | ICD-10-CM | POA: Insufficient documentation

## 2018-07-04 DIAGNOSIS — I951 Orthostatic hypotension: Secondary | ICD-10-CM | POA: Insufficient documentation

## 2018-07-04 DIAGNOSIS — E1140 Type 2 diabetes mellitus with diabetic neuropathy, unspecified: Secondary | ICD-10-CM | POA: Insufficient documentation

## 2018-07-04 NOTE — Telephone Encounter (Signed)
Called Paige Arnold today to inform her that she had missed her appointment with Zella Ball.  No answer, left message to call back.

## 2018-07-10 ENCOUNTER — Telehealth: Payer: Self-pay | Admitting: Registered Nurse

## 2018-07-10 NOTE — Telephone Encounter (Signed)
Patients daughter called and states patient has been in hosp - then moved to 2 weeks of rehab in facility. They have changed her pain meds - they will release her this Sunday [id number] - I scheduled with ET for 4 days later

## 2018-07-11 ENCOUNTER — Encounter: Payer: Self-pay | Admitting: Vascular Surgery

## 2018-07-17 ENCOUNTER — Encounter: Payer: Self-pay | Admitting: Registered Nurse

## 2018-07-17 ENCOUNTER — Other Ambulatory Visit: Payer: Self-pay

## 2018-07-17 ENCOUNTER — Encounter: Payer: No Typology Code available for payment source | Attending: Registered Nurse | Admitting: Registered Nurse

## 2018-07-17 VITALS — BP 133/69 | HR 72

## 2018-07-17 DIAGNOSIS — E1140 Type 2 diabetes mellitus with diabetic neuropathy, unspecified: Secondary | ICD-10-CM | POA: Diagnosis present

## 2018-07-17 DIAGNOSIS — G894 Chronic pain syndrome: Secondary | ICD-10-CM | POA: Insufficient documentation

## 2018-07-17 DIAGNOSIS — G90522 Complex regional pain syndrome I of left lower limb: Secondary | ICD-10-CM | POA: Diagnosis not present

## 2018-07-17 DIAGNOSIS — E1149 Type 2 diabetes mellitus with other diabetic neurological complication: Secondary | ICD-10-CM | POA: Insufficient documentation

## 2018-07-17 DIAGNOSIS — Z96652 Presence of left artificial knee joint: Secondary | ICD-10-CM

## 2018-07-17 DIAGNOSIS — G8929 Other chronic pain: Secondary | ICD-10-CM | POA: Diagnosis present

## 2018-07-17 DIAGNOSIS — M48061 Spinal stenosis, lumbar region without neurogenic claudication: Secondary | ICD-10-CM | POA: Diagnosis present

## 2018-07-17 DIAGNOSIS — M25562 Pain in left knee: Secondary | ICD-10-CM | POA: Diagnosis not present

## 2018-07-17 DIAGNOSIS — Z79899 Other long term (current) drug therapy: Secondary | ICD-10-CM | POA: Insufficient documentation

## 2018-07-17 DIAGNOSIS — Z5181 Encounter for therapeutic drug level monitoring: Secondary | ICD-10-CM | POA: Diagnosis present

## 2018-07-17 MED ORDER — HYDROCODONE-ACETAMINOPHEN 5-325 MG PO TABS: 1.0000 | ORAL_TABLET | Freq: Three times a day (TID) | ORAL | 0 refills | Status: DC | PRN

## 2018-07-17 NOTE — Progress Notes (Signed)
Subjective:    Patient ID: Paige Arnold, female    DOB: 03-18-1941, 78 y.o.   MRN: [id number]  HPI: Paige Arnold is a 78 y.o. female who returns for follow up appointment for chronic pain and medication refill. She states her pain is located in her lower back and left knee. She also reports she has a headache today. She rates her pain 10.  Her current exercise regime is walking in her home short distances using her walker.   Ms. Kyer was admitted to [facility] on 06/15/2018 and discharge to Nursing home on 06/25/2018. 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 Her gabapentin was decreased and she was weaned off MS Contin. She is currently prescribed Hydrocodone. We will continue with Hydrocodone. Ms. Olds and daughter verbalizes understanding.   Ms. Ciampa asking about Voltaren gel with recent Stroke we will not order at this time due to contraindication. This will be discussed with Dr. Letta Pate, they verbalize understanding.   Ms. Milewski has to purchase a chair lift her daughter reports the cost can range $5000-7000 dollars, she ask if this provider could send a prescription to [facility] fax number [phone]. This will be completed, she verbalizes understanding.   Ms. Quilter Morphine equivalent is 18.75 MME.  Last Oral Swab was Performed on 03/14/2018, it was consistent.   Pain Inventory Average Pain 10 Pain Right Now 10 My pain is sharp, burning, dull, stabbing and tingling  In the last 24 hours, has pain interfered with the following? General activity 0 Relation with others 1 Enjoyment of life 5 What TIME of day is your pain at its worst? all Sleep (in general) Poor  Pain is worse with: walking, bending, sitting, standing and some activites Pain improves with: medication Relief from Meds: 5  Mobility walk with assistance use a walker ability to climb steps?  no do you drive?  no use a wheelchair needs help with  transfers  Function I need assistance with the following:  meal prep, household duties and shopping  Neuro/Psych bladder control problems weakness tingling trouble walking depression loss of taste or smell  Prior Studies Any changes since last visit?  no  Physicians involved in your care Any changes since last visit?  yes   Family History  Problem Relation Age of Onset  . Kidney disease Mother   . Heart disease Father   . Diabetes Brother   . Alcohol abuse Brother   . Depression Brother   . Sarcoidosis Brother   . ALS Brother        Deceased, 26s  . Heart disease Brother   . Bipolar disorder Daughter    Social History   Socioeconomic History  . Marital status: Married    Spouse name: Not on file  . Number of children: 3  . Years of education: 31  . Highest education level: Not on file  Occupational History  . Occupation: retired  Scientific laboratory technician  . Financial resource strain: Not on file  . Food insecurity:    Worry: Not on file    Inability: Not on file  . Transportation needs:    Medical: Not on file    Non-medical: Not on file  Tobacco Use  . Smoking status: Never Smoker  . Smokeless tobacco: Never Used  Substance and Sexual Activity  . Alcohol use: No    Alcohol/week: 0.0 standard drinks  . Drug use: No  . Sexual activity: Yes  Lifestyle  .  Physical activity:    Days per week: Not on file    Minutes per session: Not on file  . Stress: Not on file  Relationships  . Social connections:    Talks on phone: Not on file    Gets together: Not on file    Attends religious service: Not on file    Active member of club or organization: Not on file    Attends meetings of clubs or organizations: Not on file    Relationship status: Not on file  Other Topics Concern  . Not on file  Social History Narrative   She lives with husband and daughter.  Three grown children.   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     She took early  retirement 11 because of problems of RSD.   Highest level of education:  Graduated high school   Past Surgical History:  Procedure Laterality Date  . A/V FISTULAGRAM N/A 09/20/2017   Procedure: A/V FISTULAGRAM - left arm;  Surgeon: Angelia Mould, MD;  Location: [facility];  Service: Cardiovascular;  Laterality: N/A;  . AV FISTULA PLACEMENT Left 02/16/2015   Procedure: LEFT ARM BRACHIOCEPHALIC (AV) FISTULA CREATION;  Surgeon: Mal Misty, MD;  Location: [facility];  Service: Vascular;  Laterality: Left;  . AV FISTULA PLACEMENT Left 06/24/2018   Procedure: INSERTION OF ARTERIOVENOUS (AV) GORE-TEX GRAFT LEFT UPPER ARM;  Surgeon: Elam Dutch, MD;  Location: [facility];  Service: Vascular;  Laterality: Left;  . CHOLECYSTECTOMY    . INTRAOCULAR LENS INSERTION Right 10-21-13  . LIGATION OF COMPETING BRANCHES OF ARTERIOVENOUS FISTULA Left 02/16/2015   Procedure: LIGATION OF COMPETING BRANCHES OF LEFT BRACHIOCEPHALIC ARTERIOVENOUS FISTULA;  Surgeon: Mal Misty, MD;  Location: [facility];  Service: Vascular;  Laterality: Left;  . PARTIAL HIP ARTHROPLASTY    . PARTIAL KNEE ARTHROPLASTY     left  . PERIPHERAL VASCULAR BALLOON ANGIOPLASTY Left 09/20/2017   Procedure: PERIPHERAL VASCULAR BALLOON ANGIOPLASTY;  Surgeon: Angelia Mould, MD;  Location: [facility];  Service: Cardiovascular;  Laterality: Left;  left fistulagram  . TEMPOROMANDIBULAR JOINT SURGERY    . TONSILLECTOMY    . TUBAL LIGATION     Past Medical History:  Diagnosis Date  . Arthritis   . Diabetes mellitus   . Diabetic neuropathy ([place])   . GERD (gastroesophageal reflux disease)   . Hypertension   . Kidney disease   . Pneumonia Oct. 2016  . Shortness of breath dyspnea    when I first lay down, then it gets better  . Sleep apnea    does not wear CPAP   . Thyroid disorder    BP 133/69   Pulse 72   SpO2 96%   Opioid Risk Score:   Fall Risk Score:  `1  Depression screen PHQ 2/9  Depression screen [facility]  2/9 04/10/2018 02/07/2018 10/08/2017 08/19/2017 07/19/2017 06/18/2017 05/16/2017  Decreased Interest 0 0 - 0 0 0 0  Down, Depressed, Hopeless 0 0 0 0 0 0 0  PHQ - 2 Score 0 0 0 0 0 0 0  Altered sleeping - - - - - - -  Tired, decreased energy - - - - - - -  Change in appetite - - - - - - -  Feeling bad or failure about yourself  - - - - - - -  Trouble concentrating - - - - - - -  Moving slowly or fidgety/restless - - - - - - -  Suicidal thoughts - - - - - - -  PHQ-9 Score - - - - - - -  Some recent data might be hidden    Review of Systems  Constitutional: Positive for appetite change.  HENT: Negative.   Eyes: Negative.   Respiratory: Negative.   Cardiovascular: Negative.   Gastrointestinal: Positive for abdominal pain.  Endocrine: Negative.   Genitourinary: Positive for dysuria.  Musculoskeletal: Positive for back pain and gait problem.  Skin: Negative.   Allergic/Immunologic: Negative.   Neurological: Positive for dizziness and weakness.  Hematological: Negative.   Psychiatric/Behavioral: Negative.   All other systems reviewed and are negative.      Objective:   Physical Exam Vitals signs and nursing note reviewed.  Constitutional:      Appearance: Normal appearance.  Neck:     Musculoskeletal: Normal range of motion and neck supple.  Cardiovascular:     Rate and Rhythm: Normal rate and regular rhythm.     Pulses: Normal pulses.  Pulmonary:     Effort: Pulmonary effort is normal.     Breath sounds: Normal breath sounds.  Musculoskeletal:     Comments: Normal Muscle Bulk and Muscle Testing Reveals:  Upper Extremities: Full ROM and Muscle Strength 5/5 Bilateral AC Joint Tenderness Lumbar Paraspinal Tenderness: L-3-L-5  Lower Extremities: Full ROM and Muscle Strength 5/5 Arrived in wheelchair   Skin:    General: Skin is warm and dry.  Neurological:     Mental Status: She is alert and oriented to person, place, and time.  Psychiatric:        Mood and Affect: Mood  normal.        Behavior: Behavior normal.           Assessment & Plan:  1.Spinal stenosis chronic low back pain radiating into the legs:01/02//2020: RX: Hydrocodone 5/325 mg mg take one tablet by mouththree times daily as needed for pain #90. We will continue the opioid monitoring program, this consists of regular clinic visits, examinations, urine drug screen, pill counts as well as use of [place] Controlled Substance Reporting System. 2. Diabetic Neuropathy: Continuecurrent medication regime withGabapentin Neurology Following. Marland Kitchen01/08/2018 3. Left Knee Pain: S/P Peripheral Nerve Block received 6-8 hours of relief.Continue Current Medication and exercise Regimen. Ms. Wardrop refuses Cryoablation. ( RSD) Continue Lidoderm Patches.07/17/2018 4. Left Hip Fracture S/P hemiarthroplasty: Orthopedist Following.07/17/2018  20 minutes of face to face patient care time was spent during this visit. All questions were encouraged and answered.  F/U in 1 month

## 2018-07-18 ENCOUNTER — Telehealth: Payer: Self-pay | Admitting: Registered Nurse

## 2018-07-18 ENCOUNTER — Telehealth: Payer: Self-pay | Admitting: *Deleted

## 2018-07-18 NOTE — Telephone Encounter (Signed)
I have already spoken with daughter and the order will be faxed to [facility] per daughter request.

## 2018-07-18 NOTE — Telephone Encounter (Signed)
Spoke with Dr. Letta Pate regarding Ms. Elko request for Voltaren gel, we will not prescribe the Voltaren.  Placed a call to Ivin Booty ( daughter) regarding the above she verbalizes understanding.  Will send a prescription for out door chair lift to [facility] attention [place] Fax: [phone], she verbalizes understanding.

## 2018-07-18 NOTE — Telephone Encounter (Signed)
Patients daughter left a message asking if a script has been sent for a chair lift  Durable medical equipment.  She expressed desperation.  She is hoping it can get done ASAP

## 2018-07-20 ENCOUNTER — Other Ambulatory Visit: Payer: Self-pay | Admitting: Physical Medicine & Rehabilitation

## 2018-07-21 DIAGNOSIS — N185 Chronic kidney disease, stage 5: Secondary | ICD-10-CM | POA: Diagnosis not present

## 2018-07-21 DIAGNOSIS — Z992 Dependence on renal dialysis: Secondary | ICD-10-CM | POA: Diagnosis not present

## 2018-07-21 DIAGNOSIS — E039 Hypothyroidism, unspecified: Secondary | ICD-10-CM | POA: Diagnosis not present

## 2018-07-21 DIAGNOSIS — N2581 Secondary hyperparathyroidism of renal origin: Secondary | ICD-10-CM | POA: Diagnosis not present

## 2018-07-21 DIAGNOSIS — I639 Cerebral infarction, unspecified: Secondary | ICD-10-CM | POA: Diagnosis not present

## 2018-07-21 DIAGNOSIS — E1121 Type 2 diabetes mellitus with diabetic nephropathy: Secondary | ICD-10-CM | POA: Diagnosis not present

## 2018-07-21 DIAGNOSIS — D631 Anemia in chronic kidney disease: Secondary | ICD-10-CM | POA: Diagnosis not present

## 2018-07-21 DIAGNOSIS — I120 Hypertensive chronic kidney disease with stage 5 chronic kidney disease or end stage renal disease: Secondary | ICD-10-CM | POA: Diagnosis not present

## 2018-07-21 DIAGNOSIS — G8929 Other chronic pain: Secondary | ICD-10-CM | POA: Diagnosis not present

## 2018-07-22 ENCOUNTER — Ambulatory Visit: Payer: Self-pay | Admitting: Psychology

## 2018-07-22 DIAGNOSIS — I639 Cerebral infarction, unspecified: Secondary | ICD-10-CM | POA: Diagnosis not present

## 2018-07-22 DIAGNOSIS — M545 Low back pain: Secondary | ICD-10-CM | POA: Diagnosis not present

## 2018-07-22 DIAGNOSIS — R2689 Other abnormalities of gait and mobility: Secondary | ICD-10-CM | POA: Diagnosis not present

## 2018-07-22 DIAGNOSIS — M6281 Muscle weakness (generalized): Secondary | ICD-10-CM | POA: Diagnosis not present

## 2018-07-24 DIAGNOSIS — E1165 Type 2 diabetes mellitus with hyperglycemia: Secondary | ICD-10-CM | POA: Diagnosis not present

## 2018-07-24 DIAGNOSIS — M8580 Other specified disorders of bone density and structure, unspecified site: Secondary | ICD-10-CM | POA: Diagnosis not present

## 2018-07-24 DIAGNOSIS — Z79899 Other long term (current) drug therapy: Secondary | ICD-10-CM | POA: Diagnosis not present

## 2018-07-24 DIAGNOSIS — N189 Chronic kidney disease, unspecified: Secondary | ICD-10-CM | POA: Diagnosis not present

## 2018-07-24 DIAGNOSIS — Z6830 Body mass index (BMI) 30.0-30.9, adult: Secondary | ICD-10-CM | POA: Diagnosis not present

## 2018-07-24 DIAGNOSIS — E039 Hypothyroidism, unspecified: Secondary | ICD-10-CM | POA: Diagnosis not present

## 2018-07-29 DIAGNOSIS — I639 Cerebral infarction, unspecified: Secondary | ICD-10-CM | POA: Diagnosis not present

## 2018-07-29 DIAGNOSIS — M6281 Muscle weakness (generalized): Secondary | ICD-10-CM | POA: Diagnosis not present

## 2018-07-29 DIAGNOSIS — R2689 Other abnormalities of gait and mobility: Secondary | ICD-10-CM | POA: Diagnosis not present

## 2018-07-29 DIAGNOSIS — M545 Low back pain: Secondary | ICD-10-CM | POA: Diagnosis not present

## 2018-07-30 ENCOUNTER — Encounter

## 2018-07-30 ENCOUNTER — Encounter (HOSPITAL_COMMUNITY): Payer: Medicare Other

## 2018-07-30 ENCOUNTER — Ambulatory Visit: Payer: Medicare Other | Admitting: Vascular Surgery

## 2018-07-30 ENCOUNTER — Other Ambulatory Visit (HOSPITAL_COMMUNITY): Payer: Medicare Other

## 2018-08-05 DIAGNOSIS — M545 Low back pain: Secondary | ICD-10-CM | POA: Diagnosis not present

## 2018-08-05 DIAGNOSIS — I639 Cerebral infarction, unspecified: Secondary | ICD-10-CM | POA: Diagnosis not present

## 2018-08-05 DIAGNOSIS — R2689 Other abnormalities of gait and mobility: Secondary | ICD-10-CM | POA: Diagnosis not present

## 2018-08-05 DIAGNOSIS — M6281 Muscle weakness (generalized): Secondary | ICD-10-CM | POA: Diagnosis not present

## 2018-08-13 DIAGNOSIS — M545 Low back pain: Secondary | ICD-10-CM | POA: Diagnosis not present

## 2018-08-13 DIAGNOSIS — R2689 Other abnormalities of gait and mobility: Secondary | ICD-10-CM | POA: Diagnosis not present

## 2018-08-13 DIAGNOSIS — M6281 Muscle weakness (generalized): Secondary | ICD-10-CM | POA: Diagnosis not present

## 2018-08-13 DIAGNOSIS — I639 Cerebral infarction, unspecified: Secondary | ICD-10-CM | POA: Diagnosis not present

## 2018-08-15 ENCOUNTER — Encounter: Payer: Self-pay | Admitting: Registered Nurse

## 2018-08-15 ENCOUNTER — Encounter: Payer: No Typology Code available for payment source | Attending: Registered Nurse | Admitting: Registered Nurse

## 2018-08-15 VITALS — BP 135/69 | HR 79

## 2018-08-15 DIAGNOSIS — Z5181 Encounter for therapeutic drug level monitoring: Secondary | ICD-10-CM | POA: Diagnosis present

## 2018-08-15 DIAGNOSIS — G8929 Other chronic pain: Secondary | ICD-10-CM

## 2018-08-15 DIAGNOSIS — M48061 Spinal stenosis, lumbar region without neurogenic claudication: Secondary | ICD-10-CM | POA: Diagnosis not present

## 2018-08-15 DIAGNOSIS — G90522 Complex regional pain syndrome I of left lower limb: Secondary | ICD-10-CM | POA: Diagnosis not present

## 2018-08-15 DIAGNOSIS — E1140 Type 2 diabetes mellitus with diabetic neuropathy, unspecified: Secondary | ICD-10-CM

## 2018-08-15 DIAGNOSIS — G894 Chronic pain syndrome: Secondary | ICD-10-CM

## 2018-08-15 DIAGNOSIS — E1149 Type 2 diabetes mellitus with other diabetic neurological complication: Secondary | ICD-10-CM

## 2018-08-15 DIAGNOSIS — Z96652 Presence of left artificial knee joint: Secondary | ICD-10-CM | POA: Diagnosis not present

## 2018-08-15 DIAGNOSIS — M25512 Pain in left shoulder: Secondary | ICD-10-CM | POA: Diagnosis present

## 2018-08-15 DIAGNOSIS — M25562 Pain in left knee: Secondary | ICD-10-CM

## 2018-08-15 DIAGNOSIS — Z79899 Other long term (current) drug therapy: Secondary | ICD-10-CM

## 2018-08-15 DIAGNOSIS — R54 Age-related physical debility: Secondary | ICD-10-CM | POA: Diagnosis present

## 2018-08-15 MED ORDER — HYDROCODONE-ACETAMINOPHEN 5-325 MG PO TABS: 1.0000 | ORAL_TABLET | Freq: Three times a day (TID) | ORAL | 0 refills | Status: DC | PRN

## 2018-08-15 MED ORDER — DICLOFENAC SODIUM 1 % TD GEL: 2.0000 g | Freq: Four times a day (QID) | TRANSDERMAL | 2 refills | Status: DC

## 2018-08-15 NOTE — Progress Notes (Signed)
Subjective:    Patient ID: [name], female    DOB: April 14, 1941, 78 y.o.   MRN: [id number]  PNT:IRWE B Perdomo is a 78 y.o. female who returns for follow up appointment for chronic pain and medication refill. She states her pain is located in her left shoulder, left arm ( post AVG), lower back, left hip, and left knee pain. She rates her pain 7. Her current exercise regime is walking in the home with walker, arrived to office in wheelchair.  Daughter reports poor appetite, encouraged Ms. Romanek to drink her nutritional supplement, she verbalizes understanding.   Ms. Daughter brought in prescription from Alric Seton PA-C from [facility] stating it's okay for Ms. Struve to use the Votaren Gel, the above was discussed with Dr. Letta Pate. He agrees with plan.   Ms. Scarbrough Morphine equivalent is 15.00 MME.  Last Oral Swab was Performed on 03/14/2018, it was consistent.   Pain Inventory Average Pain 7 Pain Right Now 7 My pain is sharp, burning, dull, stabbing, tingling and aching  In the last 24 hours, has pain interfered with the following? General activity 1 Relation with others 3 Enjoyment of life 5 What TIME of day is your pain at its worst? all Sleep (in general) Fair  Pain is worse with: walking, bending, sitting, standing and some activites Pain improves with: rest, therapy/exercise and medication Relief from Meds: 4  Mobility walk with assistance use a walker ability to climb steps?  no do you drive?  no use a wheelchair needs help with transfers  Function retired I need assistance with the following:  feeding, meal prep, household duties and shopping  Neuro/Psych numbness tremor tingling trouble walking spasms anxiety  Prior Studies Any changes since last visit?  no  Physicians involved in your care Any changes since last visit?  no   Family History  Problem Relation Age of Onset  . Kidney disease Mother   . Heart disease Father   .  Diabetes Brother   . Alcohol abuse Brother   . Depression Brother   . Sarcoidosis Brother   . ALS Brother        Deceased, 32s  . Heart disease Brother   . Bipolar disorder Daughter    Social History   Socioeconomic History  . Marital status: Married    Spouse name: Not on file  . Number of children: 3  . Years of education: 57  . Highest education level: Not on file  Occupational History  . Occupation: retired  Scientific laboratory technician  . Financial resource strain: Not on file  . Food insecurity:    Worry: Not on file    Inability: Not on file  . Transportation needs:    Medical: Not on file    Non-medical: Not on file  Tobacco Use  . Smoking status: Never Smoker  . Smokeless tobacco: Never Used  Substance and Sexual Activity  . Alcohol use: No    Alcohol/week: 0.0 standard drinks  . Drug use: No  . Sexual activity: Yes  Lifestyle  . Physical activity:    Days per week: Not on file    Minutes per session: Not on file  . Stress: Not on file  Relationships  . Social connections:    Talks on phone: Not on file    Gets together: Not on file    Attends religious service: Not on file    Active member of club or organization: Not on file  Attends meetings of clubs or organizations: Not on file    Relationship status: Not on file  Other Topics Concern  . Not on file  Social History Narrative   She lives with husband and daughter.  Three grown children.   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     She took early retirement 66 because of problems of RSD.   Highest level of education:  Graduated high school   Past Surgical History:  Procedure Laterality Date  . A/V FISTULAGRAM N/A 09/20/2017   Procedure: A/V FISTULAGRAM - left arm;  Surgeon: Angelia Mould, MD;  Location: [facility];  Service: Cardiovascular;  Laterality: N/A;  . AV FISTULA PLACEMENT Left 02/16/2015   Procedure: LEFT ARM BRACHIOCEPHALIC (AV) FISTULA CREATION;  Surgeon: Mal Misty, MD;  Location: [facility];  Service: Vascular;  Laterality: Left;  . AV FISTULA PLACEMENT Left 06/24/2018   Procedure: INSERTION OF ARTERIOVENOUS (AV) GORE-TEX GRAFT LEFT UPPER ARM;  Surgeon: Elam Dutch, MD;  Location: [facility];  Service: Vascular;  Laterality: Left;  . CHOLECYSTECTOMY    . INTRAOCULAR LENS INSERTION Right 10-21-13  . LIGATION OF COMPETING BRANCHES OF ARTERIOVENOUS FISTULA Left 02/16/2015   Procedure: LIGATION OF COMPETING BRANCHES OF LEFT BRACHIOCEPHALIC ARTERIOVENOUS FISTULA;  Surgeon: Mal Misty, MD;  Location: [facility];  Service: Vascular;  Laterality: Left;  . PARTIAL HIP ARTHROPLASTY    . PARTIAL KNEE ARTHROPLASTY     left  . PERIPHERAL VASCULAR BALLOON ANGIOPLASTY Left 09/20/2017   Procedure: PERIPHERAL VASCULAR BALLOON ANGIOPLASTY;  Surgeon: Angelia Mould, MD;  Location: [facility];  Service: Cardiovascular;  Laterality: Left;  left fistulagram  . TEMPOROMANDIBULAR JOINT SURGERY    . TONSILLECTOMY    . TUBAL LIGATION     Past Medical History:  Diagnosis Date  . Arthritis   . Diabetes mellitus   . Diabetic neuropathy ([place])   . GERD (gastroesophageal reflux disease)   . Hypertension   . Kidney disease   . Pneumonia Oct. 2016  . Shortness of breath dyspnea    when I first lay down, then it gets better  . Sleep apnea    does not wear CPAP   . Thyroid disorder    BP 135/69   Pulse 79   SpO2 98%   Opioid Risk Score:   Fall Risk Score:  `1  Depression screen PHQ 2/9  Depression screen [facility] 2/9 07/17/2018 04/10/2018 02/07/2018 10/08/2017 08/19/2017 07/19/2017 06/18/2017  Decreased Interest 1 0 0 - 0 0 0  Down, Depressed, Hopeless 1 0 0 0 0 0 0  PHQ - 2 Score 2 0 0 0 0 0 0  Altered sleeping - - - - - - -  Tired, decreased energy - - - - - - -  Change in appetite - - - - - - -  Feeling bad or failure about yourself  - - - - - - -  Trouble concentrating - - - - - - -  Moving slowly or fidgety/restless - - - - - - -  Suicidal thoughts - - - - -  - -  PHQ-9 Score - - - - - - -  Some recent data might be hidden    Review of Systems  Constitutional: Positive for unexpected weight change.       High blood sugar  HENT: Negative.   Eyes: Negative.   Respiratory: Negative.   Cardiovascular: Negative.   Gastrointestinal: Negative.  Endocrine: Negative.   Genitourinary: Negative.   Musculoskeletal: Negative.   Skin: Negative.   Allergic/Immunologic: Negative.   Neurological: Negative.   Hematological: Negative.   Psychiatric/Behavioral: Negative.   All other systems reviewed and are negative.      Objective:   Physical Exam Vitals signs and nursing note reviewed.  Constitutional:      Appearance: Normal appearance. She is ill-appearing.     Comments: Frail  Neck:     Musculoskeletal: Normal range of motion and neck supple.  Cardiovascular:     Rate and Rhythm: Normal rate and regular rhythm.     Pulses: Normal pulses.     Heart sounds: Normal heart sounds.  Musculoskeletal:     Comments: Normal Muscle Bulk and Muscle Testing Reveals:  Upper Extremities:Full  ROM and Muscle Strength 5/5  Left AVG + bruit and Thrill   Lumbar Paraspinal Tenderness: L-4-L-5 Lower Extremities: Full ROM and Muscle Strength 5/5 Arrived in wheelchair  Skin:    General: Skin is warm.     Coloration: Skin is pale.  Neurological:     Mental Status: She is alert.           Assessment & Plan:  1.Spinal stenosis chronic low back pain radiating into the legs:01/31//2020: Refilled: Hydrocodone 5/325 mg mg take one tablet by mouththree times daily as needed for pain #90. We will continue the opioid monitoring program, this consists of regular clinic visits, examinations, urine drug screen, pill counts as well as use of [place] Controlled Substance Reporting System. 2. Diabetic Neuropathy: Continuecurrent medication regime withGabapentin Neurology Following. Marland Kitchen01/31/2020 3. Left Knee Pain: S/P Peripheral Nerve Block received  6-8 hours of relief.Continue Current Medication and exercise Regimen. Ms. Duell refuses Cryoablation. ( RSD) Continue Lidoderm Patches and Voltaren Gel. Marland Kitchen01/31/2020 4. Left Hip Fracture S/P hemiarthroplasty: Orthopedist Following.08/15/2018 5. Frail: Encouraged to drink her nutritional supplements, she verbalizes understanding.    20 minutes of face to face patient care time was spent during this visit. All questions were encouraged and answered.  F/U in 1 month

## 2018-08-18 DIAGNOSIS — R2689 Other abnormalities of gait and mobility: Secondary | ICD-10-CM | POA: Diagnosis not present

## 2018-08-18 DIAGNOSIS — I639 Cerebral infarction, unspecified: Secondary | ICD-10-CM | POA: Diagnosis not present

## 2018-08-18 DIAGNOSIS — M6281 Muscle weakness (generalized): Secondary | ICD-10-CM | POA: Diagnosis not present

## 2018-08-18 DIAGNOSIS — M545 Low back pain: Secondary | ICD-10-CM | POA: Diagnosis not present

## 2018-08-19 DIAGNOSIS — N390 Urinary tract infection, site not specified: Secondary | ICD-10-CM | POA: Diagnosis not present

## 2018-08-19 DIAGNOSIS — Z6829 Body mass index (BMI) 29.0-29.9, adult: Secondary | ICD-10-CM | POA: Diagnosis not present

## 2018-08-27 DIAGNOSIS — M6281 Muscle weakness (generalized): Secondary | ICD-10-CM | POA: Diagnosis not present

## 2018-08-27 DIAGNOSIS — I639 Cerebral infarction, unspecified: Secondary | ICD-10-CM | POA: Diagnosis not present

## 2018-08-27 DIAGNOSIS — M545 Low back pain: Secondary | ICD-10-CM | POA: Diagnosis not present

## 2018-08-27 DIAGNOSIS — R2689 Other abnormalities of gait and mobility: Secondary | ICD-10-CM | POA: Diagnosis not present

## 2018-08-29 DIAGNOSIS — N390 Urinary tract infection, site not specified: Secondary | ICD-10-CM | POA: Diagnosis not present

## 2018-08-29 DIAGNOSIS — N952 Postmenopausal atrophic vaginitis: Secondary | ICD-10-CM | POA: Diagnosis not present

## 2018-08-29 DIAGNOSIS — B373 Candidiasis of vulva and vagina: Secondary | ICD-10-CM | POA: Diagnosis not present

## 2018-08-29 DIAGNOSIS — N185 Chronic kidney disease, stage 5: Secondary | ICD-10-CM | POA: Diagnosis not present

## 2018-08-29 DIAGNOSIS — Z79899 Other long term (current) drug therapy: Secondary | ICD-10-CM | POA: Diagnosis not present

## 2018-09-02 DIAGNOSIS — E1121 Type 2 diabetes mellitus with diabetic nephropathy: Secondary | ICD-10-CM | POA: Diagnosis not present

## 2018-09-02 DIAGNOSIS — Z992 Dependence on renal dialysis: Secondary | ICD-10-CM | POA: Diagnosis not present

## 2018-09-02 DIAGNOSIS — E039 Hypothyroidism, unspecified: Secondary | ICD-10-CM | POA: Diagnosis not present

## 2018-09-02 DIAGNOSIS — D631 Anemia in chronic kidney disease: Secondary | ICD-10-CM | POA: Diagnosis not present

## 2018-09-02 DIAGNOSIS — I120 Hypertensive chronic kidney disease with stage 5 chronic kidney disease or end stage renal disease: Secondary | ICD-10-CM | POA: Diagnosis not present

## 2018-09-02 DIAGNOSIS — N189 Chronic kidney disease, unspecified: Secondary | ICD-10-CM | POA: Diagnosis not present

## 2018-09-02 DIAGNOSIS — N185 Chronic kidney disease, stage 5: Secondary | ICD-10-CM | POA: Diagnosis not present

## 2018-09-02 DIAGNOSIS — G8929 Other chronic pain: Secondary | ICD-10-CM | POA: Diagnosis not present

## 2018-09-02 DIAGNOSIS — I639 Cerebral infarction, unspecified: Secondary | ICD-10-CM | POA: Diagnosis not present

## 2018-09-02 DIAGNOSIS — N2581 Secondary hyperparathyroidism of renal origin: Secondary | ICD-10-CM | POA: Diagnosis not present

## 2018-09-03 DIAGNOSIS — M545 Low back pain: Secondary | ICD-10-CM | POA: Diagnosis not present

## 2018-09-03 DIAGNOSIS — M6281 Muscle weakness (generalized): Secondary | ICD-10-CM | POA: Diagnosis not present

## 2018-09-03 DIAGNOSIS — R2689 Other abnormalities of gait and mobility: Secondary | ICD-10-CM | POA: Diagnosis not present

## 2018-09-03 DIAGNOSIS — I639 Cerebral infarction, unspecified: Secondary | ICD-10-CM | POA: Diagnosis not present

## 2018-09-10 DIAGNOSIS — M6281 Muscle weakness (generalized): Secondary | ICD-10-CM | POA: Diagnosis not present

## 2018-09-10 DIAGNOSIS — I639 Cerebral infarction, unspecified: Secondary | ICD-10-CM | POA: Diagnosis not present

## 2018-09-10 DIAGNOSIS — R2689 Other abnormalities of gait and mobility: Secondary | ICD-10-CM | POA: Diagnosis not present

## 2018-09-10 DIAGNOSIS — M545 Low back pain: Secondary | ICD-10-CM | POA: Diagnosis not present

## 2018-09-11 ENCOUNTER — Encounter: Payer: No Typology Code available for payment source | Attending: Registered Nurse | Admitting: Registered Nurse

## 2018-09-11 ENCOUNTER — Encounter: Payer: Self-pay | Admitting: Registered Nurse

## 2018-09-11 ENCOUNTER — Telehealth: Payer: Self-pay | Admitting: Registered Nurse

## 2018-09-11 VITALS — BP 123/71 | HR 65 | Ht 65.0 in | Wt 177.0 lb

## 2018-09-11 DIAGNOSIS — K219 Gastro-esophageal reflux disease without esophagitis: Secondary | ICD-10-CM | POA: Insufficient documentation

## 2018-09-11 DIAGNOSIS — G8929 Other chronic pain: Secondary | ICD-10-CM

## 2018-09-11 DIAGNOSIS — M4800 Spinal stenosis, site unspecified: Secondary | ICD-10-CM | POA: Diagnosis not present

## 2018-09-11 DIAGNOSIS — Z8249 Family history of ischemic heart disease and other diseases of the circulatory system: Secondary | ICD-10-CM | POA: Diagnosis not present

## 2018-09-11 DIAGNOSIS — M48061 Spinal stenosis, lumbar region without neurogenic claudication: Secondary | ICD-10-CM

## 2018-09-11 DIAGNOSIS — G894 Chronic pain syndrome: Secondary | ICD-10-CM

## 2018-09-11 DIAGNOSIS — M25562 Pain in left knee: Secondary | ICD-10-CM | POA: Insufficient documentation

## 2018-09-11 DIAGNOSIS — I10 Essential (primary) hypertension: Secondary | ICD-10-CM | POA: Diagnosis not present

## 2018-09-11 DIAGNOSIS — Z833 Family history of diabetes mellitus: Secondary | ICD-10-CM | POA: Diagnosis not present

## 2018-09-11 DIAGNOSIS — M7062 Trochanteric bursitis, left hip: Secondary | ICD-10-CM | POA: Diagnosis not present

## 2018-09-11 DIAGNOSIS — G90522 Complex regional pain syndrome I of left lower limb: Secondary | ICD-10-CM | POA: Diagnosis not present

## 2018-09-11 DIAGNOSIS — E079 Disorder of thyroid, unspecified: Secondary | ICD-10-CM | POA: Insufficient documentation

## 2018-09-11 DIAGNOSIS — E1140 Type 2 diabetes mellitus with diabetic neuropathy, unspecified: Secondary | ICD-10-CM | POA: Diagnosis not present

## 2018-09-11 DIAGNOSIS — Z818 Family history of other mental and behavioral disorders: Secondary | ICD-10-CM | POA: Insufficient documentation

## 2018-09-11 DIAGNOSIS — I770 Arteriovenous fistula, acquired: Secondary | ICD-10-CM | POA: Insufficient documentation

## 2018-09-11 DIAGNOSIS — Z9049 Acquired absence of other specified parts of digestive tract: Secondary | ICD-10-CM | POA: Insufficient documentation

## 2018-09-11 DIAGNOSIS — N289 Disorder of kidney and ureter, unspecified: Secondary | ICD-10-CM | POA: Insufficient documentation

## 2018-09-11 DIAGNOSIS — I951 Orthostatic hypotension: Secondary | ICD-10-CM | POA: Diagnosis not present

## 2018-09-11 DIAGNOSIS — Z9889 Other specified postprocedural states: Secondary | ICD-10-CM | POA: Insufficient documentation

## 2018-09-11 DIAGNOSIS — Z79899 Other long term (current) drug therapy: Secondary | ICD-10-CM

## 2018-09-11 DIAGNOSIS — Z811 Family history of alcohol abuse and dependence: Secondary | ICD-10-CM | POA: Diagnosis not present

## 2018-09-11 DIAGNOSIS — M545 Low back pain: Secondary | ICD-10-CM | POA: Diagnosis not present

## 2018-09-11 DIAGNOSIS — Z96642 Presence of left artificial hip joint: Secondary | ICD-10-CM | POA: Insufficient documentation

## 2018-09-11 DIAGNOSIS — Z96652 Presence of left artificial knee joint: Secondary | ICD-10-CM | POA: Diagnosis not present

## 2018-09-11 DIAGNOSIS — Z5181 Encounter for therapeutic drug level monitoring: Secondary | ICD-10-CM

## 2018-09-11 DIAGNOSIS — G4730 Sleep apnea, unspecified: Secondary | ICD-10-CM | POA: Insufficient documentation

## 2018-09-11 MED ORDER — HYDROCODONE-ACETAMINOPHEN 5-325 MG PO TABS: 1.0000 | ORAL_TABLET | Freq: Three times a day (TID) | ORAL | 0 refills | Status: DC | PRN

## 2018-09-11 NOTE — Progress Notes (Addendum)
Subjective:    Patient ID: Paige Arnold, female    DOB: 11-Feb-1941, 78 y.o.   MRN: [id number]  HPI: Paige Arnold is a 78 y.o. female who returns for follow up appointment for chronic pain and medication refill. She states her pain is located in her lower back, left hip and left knee.She rates her pain 6. Her current exercise regime is walking.  Left AVG + Bruit and thrill.   Ms. Valenti.Morphine equivalent is 15.00 MME.  Last Oral Swab was Performed on 03/14/2018, it was consistent.   Pain Inventory Average Pain 5 Pain Right Now 6 My pain is sharp, burning, dull, stabbing, tingling and aching  In the last 24 hours, has pain interfered with the following? General activity 2 Relation with others 4 Enjoyment of life 5 What TIME of day is your pain at its worst? all Sleep (in general) Fair  Pain is worse with: walking, bending, sitting and standing Pain improves with: rest and medication Relief from Meds: 5  Mobility use a cane use a walker ability to climb steps?  no do you drive?  no  Function retired I need assistance with the following:  meal prep, household duties and shopping  Neuro/Psych bladder control problems tingling  Prior Studies Any changes since last visit?  no  Physicians involved in your care Any changes since last visit?  no   Family History  Problem Relation Age of Onset  . Kidney disease Mother   . Heart disease Father   . Diabetes Brother   . Alcohol abuse Brother   . Depression Brother   . Sarcoidosis Brother   . ALS Brother        Deceased, 13s  . Heart disease Brother   . Bipolar disorder Daughter    Social History   Socioeconomic History  . Marital status: Married    Spouse name: Not on file  . Number of children: 3  . Years of education: 12  . Highest education level: Not on file  Occupational History  . Occupation: retired  Scientific laboratory technician  . Financial resource strain: Not on file  . Food insecurity:    Worry: Not on file   Inability: Not on file  . Transportation needs:    Medical: Not on file    Non-medical: Not on file  Tobacco Use  . Smoking status: Never Smoker  . Smokeless tobacco: Never Used  Substance and Sexual Activity  . Alcohol use: No    Alcohol/week: 0.0 standard drinks  . Drug use: No  . Sexual activity: Yes  Lifestyle  . Physical activity:    Days per week: Not on file    Minutes per session: Not on file  . Stress: Not on file  Relationships  . Social connections:    Talks on phone: Not on file    Gets together: Not on file    Attends religious service: Not on file    Active member of club or organization: Not on file    Attends meetings of clubs or organizations: Not on file    Relationship status: Not on file  Other Topics Concern  . Not on file  Social History Narrative   She lives with husband and daughter.  Three grown children.   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     She took early retirement 76 because of problems of RSD.   Highest level of education:  Graduated high school   Past Surgical  History:  Procedure Laterality Date  . A/V FISTULAGRAM N/A 09/20/2017   Procedure: A/V FISTULAGRAM - left arm;  Surgeon: Angelia Mould, MD;  Location: [facility];  Service: Cardiovascular;  Laterality: N/A;  . AV FISTULA PLACEMENT Left 02/16/2015   Procedure: LEFT ARM BRACHIOCEPHALIC (AV) FISTULA CREATION;  Surgeon: Mal Misty, MD;  Location: [place];  Service: Vascular;  Laterality: Left;  . AV FISTULA PLACEMENT Left 06/24/2018   Procedure: INSERTION OF ARTERIOVENOUS (AV) GORE-TEX GRAFT LEFT UPPER ARM;  Surgeon: Elam Dutch, MD;  Location: [place];  Service: Vascular;  Laterality: Left;  . CHOLECYSTECTOMY    . INTRAOCULAR LENS INSERTION Right 10-21-13  . LIGATION OF COMPETING BRANCHES OF ARTERIOVENOUS FISTULA Left 02/16/2015   Procedure: LIGATION OF COMPETING BRANCHES OF LEFT BRACHIOCEPHALIC ARTERIOVENOUS FISTULA;  Surgeon: Mal Misty,  MD;  Location: [place];  Service: Vascular;  Laterality: Left;  . PARTIAL HIP ARTHROPLASTY    . PARTIAL KNEE ARTHROPLASTY     left  . PERIPHERAL VASCULAR BALLOON ANGIOPLASTY Left 09/20/2017   Procedure: PERIPHERAL VASCULAR BALLOON ANGIOPLASTY;  Surgeon: Angelia Mould, MD;  Location: [facility];  Service: Cardiovascular;  Laterality: Left;  left fistulagram  . TEMPOROMANDIBULAR JOINT SURGERY    . TONSILLECTOMY    . TUBAL LIGATION     Past Medical History:  Diagnosis Date  . Arthritis   . Diabetes mellitus   . Diabetic neuropathy ([place])   . GERD (gastroesophageal reflux disease)   . Hypertension   . Kidney disease   . Pneumonia Oct. 2016  . Shortness of breath dyspnea    when I first lay down, then it gets better  . Sleep apnea    does not wear CPAP   . Thyroid disorder    There were no vitals taken for this visit.  Opioid Risk Score:   Fall Risk Score:  `1  Depression screen PHQ 2/9  Depression screen [facility] 2/9 07/17/2018 04/10/2018 02/07/2018 10/08/2017 08/19/2017 07/19/2017 06/18/2017  Decreased Interest 1 0 0 - 0 0 0  Down, Depressed, Hopeless 1 0 0 0 0 0 0  PHQ - 2 Score 2 0 0 0 0 0 0  Altered sleeping - - - - - - -  Tired, decreased energy - - - - - - -  Change in appetite - - - - - - -  Feeling bad or failure about yourself  - - - - - - -  Trouble concentrating - - - - - - -  Moving slowly or fidgety/restless - - - - - - -  Suicidal thoughts - - - - - - -  PHQ-9 Score - - - - - - -  Some recent data might be hidden     Review of Systems  Constitutional: Positive for appetite change and unexpected weight change.  HENT: Negative.   Eyes: Negative.   Respiratory: Negative.   Cardiovascular: Negative.   Gastrointestinal: Negative.   Endocrine: Negative.   Genitourinary: Positive for difficulty urinating.  Musculoskeletal: Positive for arthralgias, back pain, joint swelling and myalgias.  Skin: Negative.   Allergic/Immunologic: Negative.   Neurological:  Negative.   Hematological: Negative.   Psychiatric/Behavioral: Negative.   All other systems reviewed and are negative.      Objective:   Physical Exam Vitals signs and nursing note reviewed.  Constitutional:      Appearance: Normal appearance.  Neck:     Musculoskeletal: Normal range of motion and neck supple.  Cardiovascular:     Rate and Rhythm: Normal rate and regular rhythm.     Pulses: Normal pulses.     Heart sounds: Normal heart sounds.  Pulmonary:     Effort: Pulmonary effort is normal.     Breath sounds: Normal breath sounds.  Musculoskeletal:     Comments: Normal Muscle Bulk and Muscle Testing Reveals:  Upper Extremities: Full ROM and Muscle Strength 5/5 Lumbar Paraspinal Tenderness: L-4-L-5  Lower Extremities: Full ROM and Muscle Strength 5/5 Arises from Table slowly using cane for support Narrow Based Gait   Skin:    General: Skin is warm and dry.  Neurological:     Mental Status: She is alert and oriented to person, place, and time.  Psychiatric:        Mood and Affect: Mood normal.        Behavior: Behavior normal.           Assessment & Plan:  1.Spinal stenosis chronic low back pain radiating into the legs:02/27//2020: Refilled: Hydrocodone5/325 mgmg take one tablet by mouththree times dailyas needed for pain#90. We will continue the opioid monitoring program, this consists of regular clinic visits, examinations, urine drug screen, pill counts as well as use of [place] Controlled Substance Reporting System. 2. Diabetic Neuropathy: Continuecurrent medication regime withGabapentin Neurology Following.Marland Kitchen02/27/2020 3. Left Knee Pain: S/P Peripheral Nerve Block received 6-8 hours of relief.Continue Current Medication and exercise Regimen. Ms. Blunck refuses Cryoablation. ( RSD) Continue Lidoderm Patches and Voltaren Gel. 02//27/2020 4. Left Hip Fracture S/P hemiarthroplasty: Orthopedist Following.09/11/2018 5. Frail: Encouraged to drink  her nutritional supplements, she verbalizes understanding.   6. Left Greater Trochanter Tenderness: Continue to Monitor.   20 minutes of face to face patient care time was spent during this visit. All questions were encouraged and answered.  F/U in 1 month

## 2018-09-13 DIAGNOSIS — E039 Hypothyroidism, unspecified: Secondary | ICD-10-CM | POA: Diagnosis not present

## 2018-09-13 DIAGNOSIS — E1165 Type 2 diabetes mellitus with hyperglycemia: Secondary | ICD-10-CM | POA: Diagnosis not present

## 2018-09-17 DIAGNOSIS — M6281 Muscle weakness (generalized): Secondary | ICD-10-CM | POA: Diagnosis not present

## 2018-09-17 DIAGNOSIS — M545 Low back pain: Secondary | ICD-10-CM | POA: Diagnosis not present

## 2018-09-17 DIAGNOSIS — R2689 Other abnormalities of gait and mobility: Secondary | ICD-10-CM | POA: Diagnosis not present

## 2018-09-17 DIAGNOSIS — I639 Cerebral infarction, unspecified: Secondary | ICD-10-CM | POA: Diagnosis not present

## 2018-09-19 NOTE — Progress Notes (Signed)
Follow-up Visit   Date: 09/22/18    Paige Arnold MRN: [id number] DOB: Sep 19, 1940   Interim History: Paige Arnold is a 78 y.o. right-handed Caucasian female with hypertension, diabetes mellitus, depression/anxiety, hypothyroidism, and chronic pain returning for follow-up of embolic stroke (85/0277), memory loss, and neuropathy. The patient was accompanied to the clinic by self.  She scheduled an acute visit for increased falls, generalized shakes, headaches and whole body pain.  She is accompanied by her daughter today who says symptoms started about 3 weeks ago.  She has been falling almost daily, there is no preceding weakness or pain.  Daughter feels that her falls are due to her increased jerks and shakes.  Patient cannot tell when she is going to fall and denies associated lightheadedness.  She always requires assistance to stand.  She has bruised her left hand and has not had it x-rayed.  She also tells me that she suffered a fall off the commode in our lobby bathroom.  She continues to have significant generalized pain, and it is difficult to determine what is new and old.  Patient does not feel she has had any fractures.  She also complains of bifrontal throbbing headaches which is alleviated with Tylenol for a few hours.  Upon further questioning, daughter tells me that she has stage V kidney disease and is being evaluated for possible hemodialysis.  Review of her medication list shows that she is taking gabapentin 300 mg daily, Cymbalta 60 mg daily, aspirin, and she continues to take ibuprofen as needed for headaches.  UPDATE 09/22/2018:  She is here for follow-up visit.  Since her last visit in November, she appears to be doing much better.  She was hospitalized in December for confusion, hypertensive urgency, increased hand tremors and found to have encephalopathy due to polypharmacy, UTI, and acute stroke.  Imaging showed several small foci of subacute versus acute infarction in  posterior superior frontal lobes, right anterior lateral frontal lobe, right putamen, right occipital lobe.  She was started on aspirin 81mg  daily, but daughter states that she has not been getting this.  Her pain medication regimen has been adjusted and she is followed by Danella Sensing, NP with PM&R, she is tolerating her current regimen much better.  She has not had any more tremors or jerks.  No falls since being in the hospital.  She is able to take care of own ADLs and a few chores around the home such as laundry.  Mood is doing much better, the best I have seen her, in fact.    Medications:  Current Outpatient Medications on File Prior to Visit  Medication Sig Dispense Refill  . aspirin EC 81 MG EC tablet Take 1 tablet (81 mg total) by mouth daily.    . cloNIDine (CATAPRES) 0.1 MG tablet Take 0.1 mg by mouth 2 (two) times daily.     Marland Kitchen donepezil (ARICEPT) 10 MG tablet Take 1 tablet (10 mg total) by mouth at bedtime. 90 tablet 3  . Dulaglutide (TRULICITY) 1.5 AJ/2.8NO SOPN Inject 1.5 mg into the skin every Sunday.    . DULoxetine (CYMBALTA) 60 MG capsule Take 60 mg by mouth daily.    . furosemide (LASIX) 80 MG tablet Take 80 mg by mouth 2 (two) times daily.     Marland Kitchen gabapentin (NEURONTIN) 100 MG capsule Take 1 capsule (100 mg total) by mouth 2 (two) times daily.    Marland Kitchen glimepiride (AMARYL) 4 MG tablet Take 4 mg by  mouth daily with breakfast.    . isosorbide mononitrate (IMDUR) 60 MG 24 hr tablet Take 1 tablet (60 mg total) by mouth daily.    Marland Kitchen levothyroxine (SYNTHROID, LEVOTHROID) 125 MCG tablet Take 150 mcg by mouth daily before breakfast.     . Multiple Vitamins-Minerals (CENTRUM SILVER PO) Take 1 tablet by mouth daily.    . Omega-3 Fatty Acids (FISH OIL) 1000 MG CAPS Take 1,200 mg by mouth 2 (two) times daily.     . polyethylene glycol (MIRALAX / GLYCOLAX) packet Take 17 g by mouth daily. 14 each 0  . rosuvastatin (CRESTOR) 10 MG tablet Take 10 mg by mouth daily.    . vitamin B-12  (CYANOCOBALAMIN) 1000 MCG tablet Take 1,000 mcg by mouth daily.     No current facility-administered medications on file prior to visit.     Allergies:  Allergies  Allergen Reactions  . Oxycodone Other (See Comments)     Record - Per pain clinic Dr, "pt to no take, has taken too long"  . Sulfa Antibiotics Itching    Review of Systems:  CONSTITUTIONAL: No fevers, chills, night sweats, or weight loss.  EYES: No visual changes or eye pain ENT: No hearing changes.  No history of nose bleeds.   RESPIRATORY: No cough, wheezing and shortness of breath.   CARDIOVASCULAR: Negative for chest pain, and palpitations.   GI: Negative for abdominal discomfort, blood in stools or black stools.  No recent change in bowel habits.   GU:  No history of incontinence.   MUSCLOSKELETAL: ++history of joint pain or swelling.  +myalgias.   SKIN: Negative for lesions, rash, and itching.   ENDOCRINE: Negative for cold or heat intolerance, polydipsia or goiter.   PSYCH:  + depression or anxiety symptoms.   NEURO: As Above.   Vital Signs:  BP 130/60   Pulse 68   Ht 5\' 5"  (1.651 m)   Wt 174 lb 6 oz (79.1 kg)   SpO2 98%   BMI 29.02 kg/m   General Medical Exam:   General:  Well appearing, comfortable  Eyes/ENT: see cranial nerve examination.   Neck:   No carotid bruits. Respiratory:  Clear to auscultation, good air entry bilaterally.   Cardiac:  Regular rate and rhythm, no murmur.   Ext:  No edema, LUE AVF  Neurological Exam: MENTAL STATUS including orientation to time, place, person, recent and remote memory, attention span and concentration, language, and fund of knowledge is fair.   Speech is not dysarthric.  Affect is cheerful.   CRANIAL NERVES:  Pupils are round and reactive to light. Extraocular muscles are intact. Mild left ptosis Face is symmetric. Tongue is midline.  MOTOR:  Motor strength is 5/5 in all extremities, there is no tremor of the hands, no myoclonic jerks.  Mild right  pronator drift.   SENSORY:  Sensation is intact to vibration bilaterally, reduced at the ankles symmetrically  REFLEXES:  2+/4 throughout, except absent Achilles  COORDINATION/GAIT:  No dysmetria. Gait is very stable, assisted with walker   Data: Lab Results  Component Value Date   TSH 3.27 08/22/2017   Lab Results  Component Value Date   VITAMINB12 >7,500 (H) 06/16/2018   Lab Results  Component Value Date   CHOL 152 06/20/2018   HDL 34 (L) 06/20/2018   LDLCALC 77 06/20/2018   TRIG 207 (H) 06/20/2018   CHOLHDL 4.5 06/20/2018    Lab Results  Component Value Date   HGBA1C 7.5 (H) 06/20/2018  Labs 05/13/2018:  GFR 12, Cr. 3.56, BUN 45, Na 139, K 4.7, Chl 98, Ca 9.1, Phos 4.3  MRI brain 06/21/2019:   1. Several small foci of reduced diffusion are present in the posterosuperior frontal lobes, right anterolateral frontal lobe, right putamen, right occipital lobe. Findings likely represent areas of acute/early subacute infarction and/or atypical for seizure related activity. Newly visible punctate lesions were likely obscured by motion artifact on the 06/18/2018 MRI of the head. 2. No structural cause of seizure identified. 3. Stable mild chronic microvascular ischemic changes and volume loss of the brain.  Echocardiogram 06/16/2018:  EF 65-70%, limited US carotids 06/17/2018:  No hemodynamically significant stenosis  EEG 06/16/2018:  mild generalized nonspecific cerebral dysfunction (encephalopathy). There was no seizure or seizure predisposition recorded on this study  IMPRESSION/PLAN: History of embolic stroke of unknown source (06/2019).  Clinically doing well with no residual deficits.   Check 30-day cardiac monitor Start aspirin 81mg  daily - ok by nephrology to continue aspirin  Continue crestor 10mg  daily BP is well controlled Continue diabetes management as per primary  Multifactorial cognitive impairment, contributed by depression and possible dementia.  Mood is  doing much better on Cymbalta 60mg , her affect is much improved from prior visits.  Continue Aricept 10mg  daily  Medication-induced tremors and myoclonus, resolved.  Tension headaches, resolved.  Diabetic neuropathy, controlled on gabapentin 100mg  twice daily - renally dosed  Advanced CKD with plan for hemodialysis in the future, she has AVF in the LUE.   Return to clinic in 4 months  Thank you for allowing me to participate in patient's care.  If I can answer any additional questions, I would be pleased to do so.    Sincerely,    Ginnette Gates K. Posey Pronto, DO

## 2018-09-22 ENCOUNTER — Ambulatory Visit: Payer: Medicare HMO | Admitting: Neurology

## 2018-09-22 ENCOUNTER — Encounter: Payer: Self-pay | Admitting: Neurology

## 2018-09-22 ENCOUNTER — Encounter

## 2018-09-22 VITALS — BP 130/60 | HR 68 | Ht 65.0 in | Wt 174.4 lb

## 2018-09-22 DIAGNOSIS — E0842 Diabetes mellitus due to underlying condition with diabetic polyneuropathy: Secondary | ICD-10-CM | POA: Diagnosis not present

## 2018-09-22 DIAGNOSIS — F0390 Unspecified dementia without behavioral disturbance: Secondary | ICD-10-CM

## 2018-09-22 DIAGNOSIS — I6340 Cerebral infarction due to embolism of unspecified cerebral artery: Secondary | ICD-10-CM

## 2018-09-22 NOTE — Patient Instructions (Addendum)
Start aspirin 81mg  daily  We will order cardiac monitor  Return to clinic in 4 months

## 2018-09-23 NOTE — Progress Notes (Signed)
Left message for Paige Arnold that it is ok for her mother to take aspirin 81 mg daily per Dr. Posey Pronto.

## 2018-09-30 DIAGNOSIS — J329 Chronic sinusitis, unspecified: Secondary | ICD-10-CM | POA: Diagnosis not present

## 2018-09-30 DIAGNOSIS — J189 Pneumonia, unspecified organism: Secondary | ICD-10-CM | POA: Diagnosis not present

## 2018-09-30 DIAGNOSIS — J3489 Other specified disorders of nose and nasal sinuses: Secondary | ICD-10-CM | POA: Diagnosis not present

## 2018-09-30 DIAGNOSIS — J40 Bronchitis, not specified as acute or chronic: Secondary | ICD-10-CM | POA: Diagnosis not present

## 2018-10-10 ENCOUNTER — Other Ambulatory Visit: Payer: Self-pay

## 2018-10-10 ENCOUNTER — Encounter: Payer: No Typology Code available for payment source | Attending: Registered Nurse | Admitting: Registered Nurse

## 2018-10-10 ENCOUNTER — Encounter: Payer: Self-pay | Admitting: Registered Nurse

## 2018-10-10 VITALS — Ht 65.0 in | Wt 174.0 lb

## 2018-10-10 DIAGNOSIS — Z79899 Other long term (current) drug therapy: Secondary | ICD-10-CM

## 2018-10-10 DIAGNOSIS — Z818 Family history of other mental and behavioral disorders: Secondary | ICD-10-CM | POA: Insufficient documentation

## 2018-10-10 DIAGNOSIS — G4730 Sleep apnea, unspecified: Secondary | ICD-10-CM | POA: Insufficient documentation

## 2018-10-10 DIAGNOSIS — E079 Disorder of thyroid, unspecified: Secondary | ICD-10-CM | POA: Insufficient documentation

## 2018-10-10 DIAGNOSIS — I10 Essential (primary) hypertension: Secondary | ICD-10-CM | POA: Insufficient documentation

## 2018-10-10 DIAGNOSIS — G90522 Complex regional pain syndrome I of left lower limb: Secondary | ICD-10-CM

## 2018-10-10 DIAGNOSIS — I770 Arteriovenous fistula, acquired: Secondary | ICD-10-CM | POA: Insufficient documentation

## 2018-10-10 DIAGNOSIS — I951 Orthostatic hypotension: Secondary | ICD-10-CM | POA: Insufficient documentation

## 2018-10-10 DIAGNOSIS — Z811 Family history of alcohol abuse and dependence: Secondary | ICD-10-CM | POA: Insufficient documentation

## 2018-10-10 DIAGNOSIS — Z9049 Acquired absence of other specified parts of digestive tract: Secondary | ICD-10-CM | POA: Insufficient documentation

## 2018-10-10 DIAGNOSIS — Z96642 Presence of left artificial hip joint: Secondary | ICD-10-CM | POA: Insufficient documentation

## 2018-10-10 DIAGNOSIS — G894 Chronic pain syndrome: Secondary | ICD-10-CM

## 2018-10-10 DIAGNOSIS — N289 Disorder of kidney and ureter, unspecified: Secondary | ICD-10-CM | POA: Insufficient documentation

## 2018-10-10 DIAGNOSIS — Z9889 Other specified postprocedural states: Secondary | ICD-10-CM | POA: Insufficient documentation

## 2018-10-10 DIAGNOSIS — Z8249 Family history of ischemic heart disease and other diseases of the circulatory system: Secondary | ICD-10-CM | POA: Insufficient documentation

## 2018-10-10 DIAGNOSIS — M48061 Spinal stenosis, lumbar region without neurogenic claudication: Secondary | ICD-10-CM | POA: Diagnosis not present

## 2018-10-10 DIAGNOSIS — M4800 Spinal stenosis, site unspecified: Secondary | ICD-10-CM | POA: Insufficient documentation

## 2018-10-10 DIAGNOSIS — M545 Low back pain: Secondary | ICD-10-CM | POA: Insufficient documentation

## 2018-10-10 DIAGNOSIS — G8929 Other chronic pain: Secondary | ICD-10-CM

## 2018-10-10 DIAGNOSIS — M25562 Pain in left knee: Secondary | ICD-10-CM | POA: Diagnosis not present

## 2018-10-10 DIAGNOSIS — E1140 Type 2 diabetes mellitus with diabetic neuropathy, unspecified: Secondary | ICD-10-CM | POA: Insufficient documentation

## 2018-10-10 DIAGNOSIS — Z833 Family history of diabetes mellitus: Secondary | ICD-10-CM | POA: Insufficient documentation

## 2018-10-10 DIAGNOSIS — Z5181 Encounter for therapeutic drug level monitoring: Secondary | ICD-10-CM

## 2018-10-10 DIAGNOSIS — K219 Gastro-esophageal reflux disease without esophagitis: Secondary | ICD-10-CM | POA: Insufficient documentation

## 2018-10-10 NOTE — Progress Notes (Signed)
Subjective:    Patient ID: Paige Arnold, female    DOB: 31-Jul-1940, 78 y.o.   MRN: [id number]  HPI: Placed a call to [name] is a 78 y.o. female, her appointment was changed to a virtual office visit to reduce the risk of exposure to the COVID-19 virus and to help her remain healthy and safe. The virtual visit will also provide continuity of care. She verbalizes understanding. She states her pain is located in her left knee. She rates her  Pain 6. Her.current exercise regime is walking with walker in her home.   [name] Morphine equivalent is 15.00MME. Her daughter dispense her medication, she doesn't have the bottle to give an account of her medications. PMP was reviewed last filled on 09/15/2018.   Last Oral Swab was Performed on 03/14/2018, it was consistent.   Pain Inventory Average Pain 8 Pain Right Now 6 My pain is intermittent and aching  In the last 24 hours, has pain interfered with the following? General activity 8 Relation with others 0 Enjoyment of life 7 What TIME of day is your pain at its worst? varies Sleep (in general) Good  Pain is worse with: walking, standing and some activites Pain improves with: medication Relief from Meds: 3  Mobility walk with assistance use a walker  Function retired  Neuro/Psych weakness trouble walking  Prior Studies Any changes since last visit?  no  Physicians involved in your care Any changes since last visit?  no   Family History  Problem Relation Age of Onset  . Kidney disease Mother   . Heart disease Father   . Diabetes Brother   . Alcohol abuse Brother   . Depression Brother   . Sarcoidosis Brother   . ALS Brother        Deceased, 63s  . Heart disease Brother   . Bipolar disorder Daughter    Social History   Socioeconomic History  . Marital status: Married    Spouse name: Not on file  . Number of children: 3  . Years of education: 102  . Highest education level: Not on file  Occupational  History  . Occupation: retired  Scientific laboratory technician  . Financial resource strain: Not on file  . Food insecurity:    Worry: Not on file    Inability: Not on file  . Transportation needs:    Medical: Not on file    Non-medical: Not on file  Tobacco Use  . Smoking status: Never Smoker  . Smokeless tobacco: Never Used  Substance and Sexual Activity  . Alcohol use: No    Alcohol/week: 0.0 standard drinks  . Drug use: No  . Sexual activity: Yes  Lifestyle  . Physical activity:    Days per week: Not on file    Minutes per session: Not on file  . Stress: Not on file  Relationships  . Social connections:    Talks on phone: Not on file    Gets together: Not on file    Attends religious service: Not on file    Active member of club or organization: Not on file    Attends meetings of clubs or organizations: Not on file    Relationship status: Not on file  Other Topics Concern  . Not on file  Social History Narrative   She lives with husband and daughter.  Three grown children.   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     She took  early retirement 78 because of problems of RSD.   Highest level of education:  Graduated high school   Past Surgical History:  Procedure Laterality Date  . A/V FISTULAGRAM N/A 09/20/2017   Procedure: A/V FISTULAGRAM - left arm;  Surgeon: Angelia Mould, MD;  Location: [place] [facility];  Service: Cardiovascular;  Laterality: N/A;  . AV FISTULA PLACEMENT Left 02/16/2015   Procedure: LEFT ARM BRACHIOCEPHALIC (AV) FISTULA CREATION;  Surgeon: Mal Misty, MD;  Location: [place];  Service: Vascular;  Laterality: Left;  . AV FISTULA PLACEMENT Left 06/24/2018   Procedure: INSERTION OF ARTERIOVENOUS (AV) GORE-TEX GRAFT LEFT UPPER ARM;  Surgeon: Elam Dutch, MD;  Location: [place];  Service: Vascular;  Laterality: Left;  . CHOLECYSTECTOMY    . INTRAOCULAR LENS INSERTION Right 10-21-13  . LIGATION OF COMPETING BRANCHES OF ARTERIOVENOUS  FISTULA Left 02/16/2015   Procedure: LIGATION OF COMPETING BRANCHES OF LEFT BRACHIOCEPHALIC ARTERIOVENOUS FISTULA;  Surgeon: Mal Misty, MD;  Location: [place];  Service: Vascular;  Laterality: Left;  . PARTIAL HIP ARTHROPLASTY    . PARTIAL KNEE ARTHROPLASTY     left  . PERIPHERAL VASCULAR BALLOON ANGIOPLASTY Left 09/20/2017   Procedure: PERIPHERAL VASCULAR BALLOON ANGIOPLASTY;  Surgeon: Angelia Mould, MD;  Location: [facility] [facility];  Service: Cardiovascular;  Laterality: Left;  left fistulagram  . TEMPOROMANDIBULAR JOINT SURGERY    . TONSILLECTOMY    . TUBAL LIGATION     Past Medical History:  Diagnosis Date  . Arthritis   . Diabetes mellitus   . Diabetic neuropathy ([place])   . GERD (gastroesophageal reflux disease)   . Hypertension   . Kidney disease   . Pneumonia Oct. 2016  . Shortness of breath dyspnea    when I first lay down, then it gets better  . Sleep apnea    does not wear CPAP   . Thyroid disorder    Ht 5\' 5"  (1.651 m)   Wt 174 lb (78.9 kg)   BMI 28.96 kg/m   Opioid Risk Score:   Fall Risk Score:  `1  Depression screen PHQ 2/9  Depression screen [facility] 2/9 07/17/2018 04/10/2018 02/07/2018 10/08/2017 08/19/2017 07/19/2017 06/18/2017  Decreased Interest 1 0 0 - 0 0 0  Down, Depressed, Hopeless 1 0 0 0 0 0 0  PHQ - 2 Score 2 0 0 0 0 0 0  Altered sleeping - - - - - - -  Tired, decreased energy - - - - - - -  Change in appetite - - - - - - -  Feeling bad or failure about yourself  - - - - - - -  Trouble concentrating - - - - - - -  Moving slowly or fidgety/restless - - - - - - -  Suicidal thoughts - - - - - - -  PHQ-9 Score - - - - - - -  Some recent data might be hidden    Review of Systems  Constitutional: Negative.   HENT: Negative.   Eyes: Negative.   Respiratory: Negative.   Cardiovascular: Negative.   Gastrointestinal: Negative.   Endocrine: Negative.   Genitourinary: Negative.   Musculoskeletal: Positive for arthralgias and gait problem.   Allergic/Immunologic: Negative.   Neurological: Positive for weakness.  Hematological: Negative.   Psychiatric/Behavioral: Negative.   All other systems reviewed and are negative.      Objective:   Physical Exam Nursing note reviewed.  Neurological:     Mental Status: She is  oriented to person, place, and time.           Assessment & Plan:  1.Spinal stenosis chronic low back pain radiating into the legs:03/27//2020: Refilled: Hydrocodone5/325 mgmg take one tablet by mouththree times dailyas needed for pain#90. We will continue the opioid monitoring program, this consists of regular clinic visits, examinations, urine drug screen, pill counts as well as use of [place] Controlled Substance Reporting System. 2. Diabetic Neuropathy: Continuecurrent medication regime withGabapentin Neurology Following.Marland Kitchen03/27/2020 3. Left Knee Pain: S/P Peripheral Nerve Block received 6-8 hours of relief.Continue Current Medication and exercise Regimen. Ms. Binford refuses Cryoablation. ( RSD) Continue Lidoderm Patches and Voltaren Gel.03//27/2020 4. Left Hip Fracture S/P hemiarthroplasty: Orthopedist Following.10/10/2018 5. Frail: Encouraged to drink her nutritional supplements, she verbalizes understanding. 6. Left Greater Trochanter Tenderness: No complaints today. Continue to Monitor. 10/10/2018.   F/U in 1 month

## 2018-10-14 DIAGNOSIS — I639 Cerebral infarction, unspecified: Secondary | ICD-10-CM | POA: Diagnosis not present

## 2018-10-14 DIAGNOSIS — J069 Acute upper respiratory infection, unspecified: Secondary | ICD-10-CM | POA: Diagnosis not present

## 2018-10-14 DIAGNOSIS — D631 Anemia in chronic kidney disease: Secondary | ICD-10-CM | POA: Diagnosis not present

## 2018-10-14 DIAGNOSIS — N185 Chronic kidney disease, stage 5: Secondary | ICD-10-CM | POA: Diagnosis not present

## 2018-10-14 DIAGNOSIS — G8929 Other chronic pain: Secondary | ICD-10-CM | POA: Diagnosis not present

## 2018-10-14 DIAGNOSIS — I120 Hypertensive chronic kidney disease with stage 5 chronic kidney disease or end stage renal disease: Secondary | ICD-10-CM | POA: Diagnosis not present

## 2018-10-14 DIAGNOSIS — E1121 Type 2 diabetes mellitus with diabetic nephropathy: Secondary | ICD-10-CM | POA: Diagnosis not present

## 2018-10-14 DIAGNOSIS — N2581 Secondary hyperparathyroidism of renal origin: Secondary | ICD-10-CM | POA: Diagnosis not present

## 2018-10-14 DIAGNOSIS — Z992 Dependence on renal dialysis: Secondary | ICD-10-CM | POA: Diagnosis not present

## 2018-10-15 ENCOUNTER — Telehealth: Payer: Self-pay

## 2018-10-15 DIAGNOSIS — Z6829 Body mass index (BMI) 29.0-29.9, adult: Secondary | ICD-10-CM | POA: Diagnosis not present

## 2018-10-15 DIAGNOSIS — M26609 Unspecified temporomandibular joint disorder, unspecified side: Secondary | ICD-10-CM | POA: Diagnosis not present

## 2018-10-15 DIAGNOSIS — E039 Hypothyroidism, unspecified: Secondary | ICD-10-CM | POA: Diagnosis not present

## 2018-10-15 DIAGNOSIS — E1165 Type 2 diabetes mellitus with hyperglycemia: Secondary | ICD-10-CM | POA: Diagnosis not present

## 2018-10-15 DIAGNOSIS — I10 Essential (primary) hypertension: Secondary | ICD-10-CM | POA: Diagnosis not present

## 2018-10-15 MED ORDER — HYDROCODONE-ACETAMINOPHEN 5-325 MG PO TABS: 1.0000 | ORAL_TABLET | Freq: Three times a day (TID) | ORAL | 0 refills | Status: DC

## 2018-10-15 NOTE — Telephone Encounter (Signed)
Placed a call to [facility] pharmacy, they verified they never received the Hydrocodone prescription. Hydrocodone prescription was e-scribed. Placed a call to Ms. Garoutte regarding the above, spoke with her daughter, she verbalizes understanding.

## 2018-10-15 NOTE — Telephone Encounter (Signed)
[facility] in [place] called stating they never received a  Prescription for Hydrocodone that patient says suppose to be sent in. Can you resend the prescription.

## 2018-10-31 DIAGNOSIS — J189 Pneumonia, unspecified organism: Secondary | ICD-10-CM | POA: Diagnosis not present

## 2018-11-10 ENCOUNTER — Encounter: Payer: Self-pay | Admitting: Registered Nurse

## 2018-11-10 ENCOUNTER — Encounter: Payer: No Typology Code available for payment source | Attending: Registered Nurse | Admitting: Registered Nurse

## 2018-11-10 ENCOUNTER — Other Ambulatory Visit: Payer: Self-pay

## 2018-11-10 DIAGNOSIS — Z5181 Encounter for therapeutic drug level monitoring: Secondary | ICD-10-CM

## 2018-11-10 DIAGNOSIS — Z8249 Family history of ischemic heart disease and other diseases of the circulatory system: Secondary | ICD-10-CM | POA: Insufficient documentation

## 2018-11-10 DIAGNOSIS — G894 Chronic pain syndrome: Secondary | ICD-10-CM | POA: Diagnosis not present

## 2018-11-10 DIAGNOSIS — G8929 Other chronic pain: Secondary | ICD-10-CM

## 2018-11-10 DIAGNOSIS — M4800 Spinal stenosis, site unspecified: Secondary | ICD-10-CM | POA: Insufficient documentation

## 2018-11-10 DIAGNOSIS — I10 Essential (primary) hypertension: Secondary | ICD-10-CM | POA: Insufficient documentation

## 2018-11-10 DIAGNOSIS — M545 Low back pain: Secondary | ICD-10-CM | POA: Insufficient documentation

## 2018-11-10 DIAGNOSIS — N289 Disorder of kidney and ureter, unspecified: Secondary | ICD-10-CM | POA: Insufficient documentation

## 2018-11-10 DIAGNOSIS — Z79899 Other long term (current) drug therapy: Secondary | ICD-10-CM

## 2018-11-10 DIAGNOSIS — Z818 Family history of other mental and behavioral disorders: Secondary | ICD-10-CM | POA: Insufficient documentation

## 2018-11-10 DIAGNOSIS — M48061 Spinal stenosis, lumbar region without neurogenic claudication: Secondary | ICD-10-CM

## 2018-11-10 DIAGNOSIS — Z833 Family history of diabetes mellitus: Secondary | ICD-10-CM | POA: Insufficient documentation

## 2018-11-10 DIAGNOSIS — K219 Gastro-esophageal reflux disease without esophagitis: Secondary | ICD-10-CM | POA: Insufficient documentation

## 2018-11-10 DIAGNOSIS — Z96642 Presence of left artificial hip joint: Secondary | ICD-10-CM | POA: Insufficient documentation

## 2018-11-10 DIAGNOSIS — E079 Disorder of thyroid, unspecified: Secondary | ICD-10-CM | POA: Insufficient documentation

## 2018-11-10 DIAGNOSIS — I770 Arteriovenous fistula, acquired: Secondary | ICD-10-CM | POA: Insufficient documentation

## 2018-11-10 DIAGNOSIS — Z811 Family history of alcohol abuse and dependence: Secondary | ICD-10-CM | POA: Insufficient documentation

## 2018-11-10 DIAGNOSIS — Z9049 Acquired absence of other specified parts of digestive tract: Secondary | ICD-10-CM | POA: Insufficient documentation

## 2018-11-10 DIAGNOSIS — M25562 Pain in left knee: Secondary | ICD-10-CM

## 2018-11-10 DIAGNOSIS — E1140 Type 2 diabetes mellitus with diabetic neuropathy, unspecified: Secondary | ICD-10-CM | POA: Insufficient documentation

## 2018-11-10 DIAGNOSIS — Z9889 Other specified postprocedural states: Secondary | ICD-10-CM | POA: Insufficient documentation

## 2018-11-10 DIAGNOSIS — G4730 Sleep apnea, unspecified: Secondary | ICD-10-CM | POA: Insufficient documentation

## 2018-11-10 DIAGNOSIS — I951 Orthostatic hypotension: Secondary | ICD-10-CM | POA: Insufficient documentation

## 2018-11-10 MED ORDER — HYDROCODONE-ACETAMINOPHEN 5-325 MG PO TABS: 1.0000 | ORAL_TABLET | Freq: Three times a day (TID) | ORAL | 0 refills | Status: DC

## 2018-11-10 NOTE — Progress Notes (Signed)
Subjective:    Patient ID: Paige Arnold, female    DOB: 1940/08/06, 78 y.o.   MRN: [id number]  HPI: Paige Arnold is a 78 y.o. female her appointment was changed, due to national recommendations of social distancing due to [place] 19, an audio/video telehealth visit is felt to be most appropriate for this patient at this time.  See Chart message from today for the patient's consent to telehealth from [place].     She states her pain is located in her left knee. She rates her pain 4. Her current exercise regime is walking in her home with walker.   Paige Arnold Morphine equivalent is 15.00  MME.  Last Oral Swab was Performed on 03/14/2018, it was consistent.   Paige Arnold asked the Health and History Questions. This provider and Paige Arnold verified we were speaking with the correct person using two identifiers.   Pain Inventory Average Pain 8 Pain Right Now 4 My pain is intermittent, sharp, burning, dull, stabbing, tingling and aching  In the last 24 hours, has pain interfered with the following? General activity 8 Relation with others 0 Enjoyment of life 7 What TIME of day is your pain at its worst? morning Sleep (in general) Good  Pain is worse with: walking and bending Pain improves with: rest and medication Relief from Meds: 4  Mobility use a cane use a walker ability to climb steps?  no do you drive?  no  Function I need assistance with the following:  meal prep, household duties and shopping  Neuro/Psych trouble walking  Prior Studies Any changes since last visit?  no  Physicians involved in your care Any changes since last visit?  no   Family History  Problem Relation Age of Onset  . Kidney disease Mother   . Heart disease Father   . Diabetes Brother   . Alcohol abuse Brother   . Depression Brother   . Sarcoidosis Brother   . ALS Brother        Deceased, 69s  . Heart disease Brother   . Bipolar disorder  Daughter    Social History   Socioeconomic History  . Marital status: Married    Spouse name: Not on file  . Number of children: 3  . Years of education: 38  . Highest education level: Not on file  Occupational History  . Occupation: retired  Scientific laboratory technician  . Financial resource strain: Not on file  . Food insecurity:    Worry: Not on file    Inability: Not on file  . Transportation needs:    Medical: Not on file    Non-medical: Not on file  Tobacco Use  . Smoking status: Never Smoker  . Smokeless tobacco: Never Used  Substance and Sexual Activity  . Alcohol use: No    Alcohol/week: 0.0 standard drinks  . Drug use: No  . Sexual activity: Yes  Lifestyle  . Physical activity:    Days per week: Not on file    Minutes per session: Not on file  . Stress: Not on file  Relationships  . Social connections:    Talks on phone: Not on file    Gets together: Not on file    Attends religious service: Not on file    Active member of club or organization: Not on file    Attends meetings of clubs or organizations: Not on file    Relationship status: Not on file  Other Topics Concern  . Not on file  Social History Narrative   She lives with husband and daughter.  Three grown children.   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     She took early retirement 63 because of problems of RSD.   Highest level of education:  Graduated high school   Past Surgical History:  Procedure Laterality Date  . A/V FISTULAGRAM N/A 09/20/2017   Procedure: A/V FISTULAGRAM - left arm;  Surgeon: Angelia Mould, MD;  Location: [facility];  Service: Cardiovascular;  Laterality: N/A;  . AV FISTULA PLACEMENT Left 02/16/2015   Procedure: LEFT ARM BRACHIOCEPHALIC (AV) FISTULA CREATION;  Surgeon: Mal Misty, MD;  Location: [facility];  Service: Vascular;  Laterality: Left;  . AV FISTULA PLACEMENT Left 06/24/2018   Procedure: INSERTION OF ARTERIOVENOUS (AV) GORE-TEX GRAFT LEFT  UPPER ARM;  Surgeon: Elam Dutch, MD;  Location: [facility];  Service: Vascular;  Laterality: Left;  . CHOLECYSTECTOMY    . INTRAOCULAR LENS INSERTION Right 10-21-13  . LIGATION OF COMPETING BRANCHES OF ARTERIOVENOUS FISTULA Left 02/16/2015   Procedure: LIGATION OF COMPETING BRANCHES OF LEFT BRACHIOCEPHALIC ARTERIOVENOUS FISTULA;  Surgeon: Mal Misty, MD;  Location: [facility];  Service: Vascular;  Laterality: Left;  . PARTIAL HIP ARTHROPLASTY    . PARTIAL KNEE ARTHROPLASTY     left  . PERIPHERAL VASCULAR BALLOON ANGIOPLASTY Left 09/20/2017   Procedure: PERIPHERAL VASCULAR BALLOON ANGIOPLASTY;  Surgeon: Angelia Mould, MD;  Location: [facility];  Service: Cardiovascular;  Laterality: Left;  left fistulagram  . TEMPOROMANDIBULAR JOINT SURGERY    . TONSILLECTOMY    . TUBAL LIGATION     Past Medical History:  Diagnosis Date  . Arthritis   . Diabetes mellitus   . Diabetic neuropathy ([place])   . GERD (gastroesophageal reflux disease)   . Hypertension   . Kidney disease   . Pneumonia Oct. 2016  . Shortness of breath dyspnea    when I first lay down, then it gets better  . Sleep apnea    does not wear CPAP   . Thyroid disorder    There were no vitals taken for this visit.  Opioid Risk Score:   Fall Risk Score:  `1  Depression screen PHQ 2/9  Depression screen [facility] 2/9 11/10/2018 07/17/2018 04/10/2018 02/07/2018 10/08/2017 08/19/2017 07/19/2017  Decreased Interest 0 1 0 0 - 0 0  Down, Depressed, Hopeless 0 1 0 0 0 0 0  PHQ - 2 Score 0 2 0 0 0 0 0  Altered sleeping - - - - - - -  Tired, decreased energy - - - - - - -  Change in appetite - - - - - - -  Feeling bad or failure about yourself  - - - - - - -  Trouble concentrating - - - - - - -  Moving slowly or fidgety/restless - - - - - - -  Suicidal thoughts - - - - - - -  PHQ-9 Score - - - - - - -  Some recent data might be hidden   Review of Systems  Constitutional: Negative.   HENT: Negative.   Eyes: Negative.    Respiratory: Negative.   Cardiovascular: Negative.   Gastrointestinal: Negative.   Endocrine: Negative.   Genitourinary: Negative.   Musculoskeletal: Positive for back pain and gait problem.  Skin: Negative.   Allergic/Immunologic: Negative.   Hematological: Negative.   Psychiatric/Behavioral: Negative.  All other systems reviewed and are negative.      Objective:   Physical Exam Vitals signs and nursing note reviewed.  Musculoskeletal:     Comments: No Physical Exam Performed: Virtual Visit  Neurological:     Mental Status: She is oriented to person, place, and time.           Assessment & Plan:  1.Spinal stenosis chronic low back pain radiating into the legs: No Complaints Today. Continue  Current medication regimen. Continue to monitor. 04/27//2020: Refilled: Hydrocodone5/325 mgmg take one tablet by mouththree times dailyas needed for pain#90. We will continue the opioid monitoring program, this consists of regular clinic visits, examinations, urine drug screen, pill counts as well as use of [place] Controlled Substance Reporting System. 2. Diabetic Neuropathy: Continuecurrent medication regime withGabapentin Neurology Following.Marland Kitchen04/27/2020 3. Left Knee Pain: S/P Peripheral Nerve Block received 6-8 hours of relief.Continue Current Medication and exercise Regimen. Ms. Haydu refuses Cryoablation. ( RSD) Continue Lidoderm Patches and Voltaren Gel.04//27/2020 4. Left Hip Fracture S/P hemiarthroplasty: Orthopedist Following.11/10/2018 5. Left Greater Trochanter Tenderness: No complaints today. Continue to Monitor.11/10/2018.  F/U in 1 month  Telephone Call  Location of patient: In her Home Location of provider: Office Established patient Time spent on call: 10 Minutes

## 2018-11-24 ENCOUNTER — Other Ambulatory Visit: Payer: Self-pay | Admitting: Neurology

## 2018-11-27 ENCOUNTER — Telehealth: Payer: Self-pay | Admitting: Neurology

## 2018-11-27 ENCOUNTER — Other Ambulatory Visit: Payer: Self-pay | Admitting: *Deleted

## 2018-11-27 DIAGNOSIS — I6340 Cerebral infarction due to embolism of unspecified cerebral artery: Secondary | ICD-10-CM

## 2018-11-27 NOTE — Telephone Encounter (Signed)
Spoke with patient's daughter yesterday and she was very upset because her mother was seen by Dr.Patel on March 9th and at that time Dr. Posey Pronto ordered an Event Monitor. Daughter stated that no one has reached out to her since that time to schedule the EM or to follow up with her. I reached out to the office manager of [facility] at the [facility] location and asked if she could follow up and let me know why the patient has not been scheduled. She noted that she pulled the patient and can see that their is an active order for the EM but it was not sent. She asked that we send the referral over to the [facility] that they will make sure that they reach out to the patient and schedule once they are back in the office. She also mentioned that depending on the type of monitor that they may be able to mail her an EM that she can put on herself so that she does not have to wait.   Please send a referral order for the EM and send to [facility] [place].

## 2018-11-27 NOTE — Telephone Encounter (Signed)
Another referral sent.

## 2018-11-30 DIAGNOSIS — J189 Pneumonia, unspecified organism: Secondary | ICD-10-CM | POA: Diagnosis not present

## 2018-12-09 ENCOUNTER — Telehealth: Payer: Self-pay | Admitting: Neurology

## 2018-12-09 NOTE — Telephone Encounter (Signed)
Spoke to daughter today and she is upset because she is stating that [facility] still does have the order for the cardiac monitor for her mom that was ordered back on March 9th. I spoke to the office supervisor of [facility] last week and she stated that if we would resend the order that she would make sure that this was taken care of. She stated that she could see the order however it was never sent to them. A second order was entered on 11/27/18. I informed the daughter that I would print the order and fax it to [facility] to make sure that they received it. I had Retia Passe to print the order and I faxed it to [facility] [phone] fax#) . Once I received the faxed confirmation I called Adrienne back at [facility] and confirmed verbally that she received the orders. She confirmed that she had received the faxed orders. I also informed Vincente Liberty to please have Abby call us back if she needs any additional information. I called Ivin Booty the daughter back and informed her that I faxed both orders, the one from 3/9 and 5/14 to [facility] and she verbalized understanding.

## 2018-12-09 NOTE — Telephone Encounter (Signed)
Noted  

## 2018-12-10 ENCOUNTER — Encounter: Payer: Medicare HMO | Attending: Registered Nurse | Admitting: Registered Nurse

## 2018-12-10 ENCOUNTER — Encounter: Payer: Self-pay | Admitting: Registered Nurse

## 2018-12-10 ENCOUNTER — Other Ambulatory Visit: Payer: Self-pay

## 2018-12-10 VITALS — Ht 65.0 in | Wt 174.0 lb

## 2018-12-10 DIAGNOSIS — K219 Gastro-esophageal reflux disease without esophagitis: Secondary | ICD-10-CM | POA: Insufficient documentation

## 2018-12-10 DIAGNOSIS — Z96642 Presence of left artificial hip joint: Secondary | ICD-10-CM | POA: Insufficient documentation

## 2018-12-10 DIAGNOSIS — E079 Disorder of thyroid, unspecified: Secondary | ICD-10-CM | POA: Insufficient documentation

## 2018-12-10 DIAGNOSIS — Z9049 Acquired absence of other specified parts of digestive tract: Secondary | ICD-10-CM | POA: Insufficient documentation

## 2018-12-10 DIAGNOSIS — Z818 Family history of other mental and behavioral disorders: Secondary | ICD-10-CM | POA: Insufficient documentation

## 2018-12-10 DIAGNOSIS — Z8249 Family history of ischemic heart disease and other diseases of the circulatory system: Secondary | ICD-10-CM | POA: Insufficient documentation

## 2018-12-10 DIAGNOSIS — M25562 Pain in left knee: Secondary | ICD-10-CM | POA: Insufficient documentation

## 2018-12-10 DIAGNOSIS — G4730 Sleep apnea, unspecified: Secondary | ICD-10-CM | POA: Insufficient documentation

## 2018-12-10 DIAGNOSIS — E1140 Type 2 diabetes mellitus with diabetic neuropathy, unspecified: Secondary | ICD-10-CM | POA: Insufficient documentation

## 2018-12-10 DIAGNOSIS — G894 Chronic pain syndrome: Secondary | ICD-10-CM | POA: Diagnosis not present

## 2018-12-10 DIAGNOSIS — Z9889 Other specified postprocedural states: Secondary | ICD-10-CM | POA: Insufficient documentation

## 2018-12-10 DIAGNOSIS — I951 Orthostatic hypotension: Secondary | ICD-10-CM | POA: Insufficient documentation

## 2018-12-10 DIAGNOSIS — I10 Essential (primary) hypertension: Secondary | ICD-10-CM | POA: Insufficient documentation

## 2018-12-10 DIAGNOSIS — I770 Arteriovenous fistula, acquired: Secondary | ICD-10-CM | POA: Insufficient documentation

## 2018-12-10 DIAGNOSIS — N289 Disorder of kidney and ureter, unspecified: Secondary | ICD-10-CM | POA: Insufficient documentation

## 2018-12-10 DIAGNOSIS — M48061 Spinal stenosis, lumbar region without neurogenic claudication: Secondary | ICD-10-CM

## 2018-12-10 DIAGNOSIS — G8929 Other chronic pain: Secondary | ICD-10-CM

## 2018-12-10 DIAGNOSIS — Z79899 Other long term (current) drug therapy: Secondary | ICD-10-CM

## 2018-12-10 DIAGNOSIS — Z833 Family history of diabetes mellitus: Secondary | ICD-10-CM | POA: Insufficient documentation

## 2018-12-10 DIAGNOSIS — Z811 Family history of alcohol abuse and dependence: Secondary | ICD-10-CM | POA: Insufficient documentation

## 2018-12-10 DIAGNOSIS — Z5181 Encounter for therapeutic drug level monitoring: Secondary | ICD-10-CM

## 2018-12-10 DIAGNOSIS — M545 Low back pain: Secondary | ICD-10-CM | POA: Insufficient documentation

## 2018-12-10 DIAGNOSIS — G90522 Complex regional pain syndrome I of left lower limb: Secondary | ICD-10-CM

## 2018-12-10 DIAGNOSIS — M4800 Spinal stenosis, site unspecified: Secondary | ICD-10-CM | POA: Insufficient documentation

## 2018-12-10 MED ORDER — HYDROCODONE-ACETAMINOPHEN 5-325 MG PO TABS: 1.0000 | ORAL_TABLET | Freq: Three times a day (TID) | ORAL | 0 refills | Status: DC

## 2018-12-10 NOTE — Progress Notes (Signed)
Subjective:    Patient ID: Paige Arnold, female    DOB: Jan 28, 1941, 78 y.o.   MRN: [id number]  HPI: Paige Arnold is a 78 y.o. female her appointment was changed, due to national recommendations of social distancing due to [place] 19, an audio/video telehealth visit is felt to be most appropriate for this patient at this time.  See Chart message from today for the patient's consent to telehealth from [place].     She states her pain is located in her lower back and left knee pain. She rates her pain 7. Her current exercise regime is walking.   Ms. Pounds Morphine equivalent is 15.00 MME.  Last Oral Swab was Performed on 03/14/2018, it was consistent.   Geryl Rankins CMA asked the Health and History Questions. This provider and Mancel Parsons verified we were  speaking with the correct person using two identifiers.   Pain Inventory Average Pain 7 Pain Right Now 7 My pain is constant, sharp, burning, dull, stabbing, tingling and aching  In the last 24 hours, has pain interfered with the following? General activity 7 Relation with others 7 Enjoyment of life 9 What TIME of day is your pain at its worst? all Sleep (in general) Fair  Pain is worse with: walking, bending, sitting, inactivity, standing and some activites Pain improves with: medication Relief from Meds: 0  Mobility walk with assistance use a walker  Function disabled: date disabled .  Neuro/Psych bowel control problems weakness numbness tingling trouble walking dizziness confusion depression anxiety  Prior Studies Any changes since last visit?  no  Physicians involved in your care Any changes since last visit?  no   Family History  Problem Relation Age of Onset  . Kidney disease Mother   . Heart disease Father   . Diabetes Brother   . Alcohol abuse Brother   . Depression Brother   . Sarcoidosis Brother   . ALS Brother        Deceased, 2s  . Heart disease  Brother   . Bipolar disorder Daughter    Social History   Socioeconomic History  . Marital status: Married    Spouse name: Not on file  . Number of children: 3  . Years of education: 58  . Highest education level: Not on file  Occupational History  . Occupation: retired  Scientific laboratory technician  . Financial resource strain: Not on file  . Food insecurity:    Worry: Not on file    Inability: Not on file  . Transportation needs:    Medical: Not on file    Non-medical: Not on file  Tobacco Use  . Smoking status: Never Smoker  . Smokeless tobacco: Never Used  Substance and Sexual Activity  . Alcohol use: No    Alcohol/week: 0.0 standard drinks  . Drug use: No  . Sexual activity: Yes  Lifestyle  . Physical activity:    Days per week: Not on file    Minutes per session: Not on file  . Stress: Not on file  Relationships  . Social connections:    Talks on phone: Not on file    Gets together: Not on file    Attends religious service: Not on file    Active member of club or organization: Not on file    Attends meetings of clubs or organizations: Not on file    Relationship status: Not on file  Other Topics Concern  . Not on file  Social History Narrative   She lives with husband and daughter.  Three grown children.   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     She took early retirement 25 because of problems of RSD.   Highest level of education:  Graduated high school   Past Surgical History:  Procedure Laterality Date  . A/V FISTULAGRAM N/A 09/20/2017   Procedure: A/V FISTULAGRAM - left arm;  Surgeon: Angelia Mould, MD;  Location: [facility];  Service: Cardiovascular;  Laterality: N/A;  . AV FISTULA PLACEMENT Left 02/16/2015   Procedure: LEFT ARM BRACHIOCEPHALIC (AV) FISTULA CREATION;  Surgeon: Mal Misty, MD;  Location: [place];  Service: Vascular;  Laterality: Left;  . AV FISTULA PLACEMENT Left 06/24/2018   Procedure: INSERTION OF  ARTERIOVENOUS (AV) GORE-TEX GRAFT LEFT UPPER ARM;  Surgeon: Elam Dutch, MD;  Location: [facility];  Service: Vascular;  Laterality: Left;  . CHOLECYSTECTOMY    . INTRAOCULAR LENS INSERTION Right 10-21-13  . LIGATION OF COMPETING BRANCHES OF ARTERIOVENOUS FISTULA Left 02/16/2015   Procedure: LIGATION OF COMPETING BRANCHES OF LEFT BRACHIOCEPHALIC ARTERIOVENOUS FISTULA;  Surgeon: Mal Misty, MD;  Location: [facility];  Service: Vascular;  Laterality: Left;  . PARTIAL HIP ARTHROPLASTY    . PARTIAL KNEE ARTHROPLASTY     left  . PERIPHERAL VASCULAR BALLOON ANGIOPLASTY Left 09/20/2017   Procedure: PERIPHERAL VASCULAR BALLOON ANGIOPLASTY;  Surgeon: Angelia Mould, MD;  Location: [facility];  Service: Cardiovascular;  Laterality: Left;  left fistulagram  . TEMPOROMANDIBULAR JOINT SURGERY    . TONSILLECTOMY    . TUBAL LIGATION     Past Medical History:  Diagnosis Date  . Arthritis   . Diabetes mellitus   . Diabetic neuropathy ([place])   . GERD (gastroesophageal reflux disease)   . Hypertension   . Kidney disease   . Pneumonia Oct. 2016  . Shortness of breath dyspnea    when I first lay down, then it gets better  . Sleep apnea    does not wear CPAP   . Thyroid disorder    There were no vitals taken for this visit.  Opioid Risk Score:   Fall Risk Score:  `1  Depression screen PHQ 2/9  Depression screen [facility] 2/9 11/10/2018 07/17/2018 04/10/2018 02/07/2018 10/08/2017 08/19/2017 07/19/2017  Decreased Interest 0 1 0 0 - 0 0  Down, Depressed, Hopeless 0 1 0 0 0 0 0  PHQ - 2 Score 0 2 0 0 0 0 0  Altered sleeping - - - - - - -  Tired, decreased energy - - - - - - -  Change in appetite - - - - - - -  Feeling bad or failure about yourself  - - - - - - -  Trouble concentrating - - - - - - -  Moving slowly or fidgety/restless - - - - - - -  Suicidal thoughts - - - - - - -  PHQ-9 Score - - - - - - -  Some recent data might be hidden    Review of Systems  Constitutional: Negative.   HENT:  Negative.   Eyes: Negative.   Respiratory: Negative.   Cardiovascular: Negative.   Gastrointestinal: Positive for constipation and diarrhea.  Endocrine: Negative.   Genitourinary: Negative.   Musculoskeletal: Positive for arthralgias, back pain and gait problem.  Skin: Negative.   Allergic/Immunologic: Negative.   Neurological: Positive for weakness and numbness.  Tingling   All other systems reviewed and are negative.      Objective:   Physical Exam Vitals signs and nursing note reviewed.  Musculoskeletal:     Comments: No Physical Exam Performed: Virtual Exam   Neurological:     Mental Status: She is oriented to person, place, and time.           Assessment & Plan:  1.Spinal stenosis chronic low back pain radiating into the legs: No Complaints Today. Continue  Current medication regimen. Continue to monitor. 05/27//2020: 2. Diabetic Neuropathy: Continuecurrent medication regime withGabapentin Neurology Following.Marland Kitchen05/27/2020 3. Left Knee Pain: S/P Peripheral Nerve Block received 6-8 hours of relief.Continue Current Medication and exercise Regimen. Ms. Witman refuses Cryoablation. ( RSD) Continue Lidoderm Patches and Voltaren Gel.05//27/2020 4. Left Hip Fracture S/P hemiarthroplasty: Orthopedist Following.12/10/2018 5. Left Greater Trochanter Tenderness:No complaints today.Continue to Monitor.12/10/2018. 6. Chronic Pain Syndrome: Refilled: Hydrocodone5/325 mgmg take one tablet by mouththree times dailyas needed for pain#90. We will continue the opioid monitoring program, this consists of regular clinic visits, examinations, urine drug screen, pill counts as well as use of [place] Controlled Substance Reporting System.  F/U in 1 month  Telephone Call Location of patient: In her Home Location of provider: Office Established patient Time spent on call:10 Minutes

## 2018-12-15 ENCOUNTER — Telehealth: Payer: Self-pay | Admitting: Neurology

## 2018-12-15 NOTE — Telephone Encounter (Signed)
Abby from heart care at [facility] called stating that heart monitor is not covered under  Current diagnoses but it under any heart rhythm dx. Pls call her with other questions at [phone].

## 2018-12-15 NOTE — Telephone Encounter (Signed)
Do you have an additional diagnosis code that can be used for the monitor to be covered?

## 2018-12-15 NOTE — Telephone Encounter (Signed)
OK to use dizziness.

## 2018-12-16 DIAGNOSIS — N189 Chronic kidney disease, unspecified: Secondary | ICD-10-CM | POA: Diagnosis not present

## 2018-12-16 DIAGNOSIS — N185 Chronic kidney disease, stage 5: Secondary | ICD-10-CM | POA: Diagnosis not present

## 2018-12-16 DIAGNOSIS — D631 Anemia in chronic kidney disease: Secondary | ICD-10-CM | POA: Diagnosis not present

## 2018-12-16 DIAGNOSIS — E1121 Type 2 diabetes mellitus with diabetic nephropathy: Secondary | ICD-10-CM | POA: Diagnosis not present

## 2018-12-16 DIAGNOSIS — I639 Cerebral infarction, unspecified: Secondary | ICD-10-CM | POA: Diagnosis not present

## 2018-12-16 DIAGNOSIS — I120 Hypertensive chronic kidney disease with stage 5 chronic kidney disease or end stage renal disease: Secondary | ICD-10-CM | POA: Diagnosis not present

## 2018-12-16 DIAGNOSIS — N2581 Secondary hyperparathyroidism of renal origin: Secondary | ICD-10-CM | POA: Diagnosis not present

## 2018-12-16 DIAGNOSIS — G8929 Other chronic pain: Secondary | ICD-10-CM | POA: Diagnosis not present

## 2018-12-16 DIAGNOSIS — Z992 Dependence on renal dialysis: Secondary | ICD-10-CM | POA: Diagnosis not present

## 2018-12-18 ENCOUNTER — Other Ambulatory Visit: Payer: Self-pay | Admitting: Neurology

## 2018-12-18 DIAGNOSIS — I6340 Cerebral infarction due to embolism of unspecified cerebral artery: Secondary | ICD-10-CM

## 2018-12-18 DIAGNOSIS — Z7689 Persons encountering health services in other specified circumstances: Secondary | ICD-10-CM

## 2018-12-23 ENCOUNTER — Other Ambulatory Visit: Payer: Self-pay | Admitting: Neurology

## 2018-12-23 DIAGNOSIS — Z7689 Persons encountering health services in other specified circumstances: Secondary | ICD-10-CM

## 2018-12-23 DIAGNOSIS — M545 Low back pain: Secondary | ICD-10-CM | POA: Diagnosis not present

## 2018-12-23 DIAGNOSIS — I6340 Cerebral infarction due to embolism of unspecified cerebral artery: Secondary | ICD-10-CM

## 2018-12-23 DIAGNOSIS — R42 Dizziness and giddiness: Secondary | ICD-10-CM

## 2018-12-23 DIAGNOSIS — R2689 Other abnormalities of gait and mobility: Secondary | ICD-10-CM | POA: Diagnosis not present

## 2018-12-23 DIAGNOSIS — I639 Cerebral infarction, unspecified: Secondary | ICD-10-CM | POA: Diagnosis not present

## 2018-12-23 DIAGNOSIS — M6281 Muscle weakness (generalized): Secondary | ICD-10-CM | POA: Diagnosis not present

## 2018-12-31 DIAGNOSIS — J189 Pneumonia, unspecified organism: Secondary | ICD-10-CM | POA: Diagnosis not present

## 2019-01-08 ENCOUNTER — Other Ambulatory Visit: Payer: Self-pay

## 2019-01-08 ENCOUNTER — Encounter: Payer: No Typology Code available for payment source | Attending: Registered Nurse | Admitting: Registered Nurse

## 2019-01-08 ENCOUNTER — Encounter: Payer: Self-pay | Admitting: Registered Nurse

## 2019-01-08 VITALS — BP 167/71 | HR 61 | Temp 98.8°F | Wt 177.0 lb

## 2019-01-08 DIAGNOSIS — E079 Disorder of thyroid, unspecified: Secondary | ICD-10-CM | POA: Diagnosis not present

## 2019-01-08 DIAGNOSIS — G90522 Complex regional pain syndrome I of left lower limb: Secondary | ICD-10-CM

## 2019-01-08 DIAGNOSIS — M4800 Spinal stenosis, site unspecified: Secondary | ICD-10-CM | POA: Diagnosis not present

## 2019-01-08 DIAGNOSIS — Z833 Family history of diabetes mellitus: Secondary | ICD-10-CM | POA: Diagnosis not present

## 2019-01-08 DIAGNOSIS — I770 Arteriovenous fistula, acquired: Secondary | ICD-10-CM | POA: Insufficient documentation

## 2019-01-08 DIAGNOSIS — M545 Low back pain: Secondary | ICD-10-CM | POA: Insufficient documentation

## 2019-01-08 DIAGNOSIS — G4730 Sleep apnea, unspecified: Secondary | ICD-10-CM | POA: Insufficient documentation

## 2019-01-08 DIAGNOSIS — Z8249 Family history of ischemic heart disease and other diseases of the circulatory system: Secondary | ICD-10-CM | POA: Diagnosis not present

## 2019-01-08 DIAGNOSIS — Z5181 Encounter for therapeutic drug level monitoring: Secondary | ICD-10-CM | POA: Diagnosis not present

## 2019-01-08 DIAGNOSIS — Z9049 Acquired absence of other specified parts of digestive tract: Secondary | ICD-10-CM | POA: Insufficient documentation

## 2019-01-08 DIAGNOSIS — G8929 Other chronic pain: Secondary | ICD-10-CM

## 2019-01-08 DIAGNOSIS — I10 Essential (primary) hypertension: Secondary | ICD-10-CM | POA: Insufficient documentation

## 2019-01-08 DIAGNOSIS — G894 Chronic pain syndrome: Secondary | ICD-10-CM

## 2019-01-08 DIAGNOSIS — M25562 Pain in left knee: Secondary | ICD-10-CM | POA: Diagnosis not present

## 2019-01-08 DIAGNOSIS — Z811 Family history of alcohol abuse and dependence: Secondary | ICD-10-CM | POA: Diagnosis not present

## 2019-01-08 DIAGNOSIS — Z79899 Other long term (current) drug therapy: Secondary | ICD-10-CM

## 2019-01-08 DIAGNOSIS — I951 Orthostatic hypotension: Secondary | ICD-10-CM | POA: Insufficient documentation

## 2019-01-08 DIAGNOSIS — N289 Disorder of kidney and ureter, unspecified: Secondary | ICD-10-CM | POA: Diagnosis not present

## 2019-01-08 DIAGNOSIS — M48061 Spinal stenosis, lumbar region without neurogenic claudication: Secondary | ICD-10-CM | POA: Diagnosis not present

## 2019-01-08 DIAGNOSIS — E1140 Type 2 diabetes mellitus with diabetic neuropathy, unspecified: Secondary | ICD-10-CM | POA: Diagnosis not present

## 2019-01-08 DIAGNOSIS — K219 Gastro-esophageal reflux disease without esophagitis: Secondary | ICD-10-CM | POA: Insufficient documentation

## 2019-01-08 DIAGNOSIS — Z96642 Presence of left artificial hip joint: Secondary | ICD-10-CM | POA: Insufficient documentation

## 2019-01-08 DIAGNOSIS — Z9889 Other specified postprocedural states: Secondary | ICD-10-CM | POA: Diagnosis not present

## 2019-01-08 DIAGNOSIS — Z818 Family history of other mental and behavioral disorders: Secondary | ICD-10-CM | POA: Diagnosis not present

## 2019-01-08 NOTE — Patient Instructions (Signed)
Please Have Ivin Booty call office today regarding your Hydrocodone:   [phone]

## 2019-01-08 NOTE — Progress Notes (Signed)
Subjective:    Patient ID: Paige Arnold, female    DOB: 1941/03/31, 78 y.o.   MRN: [id number]  HPI: Paige Arnold is a 78 y.o. female who returns for follow up appointment for chronic pain and medication refill. She states her pain is located in her lower back, left lower extremity and left knee. She rates her pain 6. Her  current exercise regime is walking.   Paige Arnold Morphine equivalent is 15.00 MME.  Last Oral Swab was performed on 03/14/2018, it was consistent. Oral Swab Performed today.   Pain Inventory Average Pain 6 Pain Right Now 6 My pain is sharp, burning, stabbing and aching  In the last 24 hours, has pain interfered with the following? General activity 1 Relation with others 2 Enjoyment of life 5 What TIME of day is your pain at its worst? all Sleep (in general) Fair  Pain is worse with: walking, bending, standing and some activites Pain improves with: rest and medication Relief from Meds: .  Mobility use a walker ability to climb steps?  no do you drive?  no  Function retired I need assistance with the following:  meal prep, household duties and shopping  Neuro/Psych weakness trouble walking  Prior Studies Any changes since last visit?  no  Physicians involved in your care Any changes since last visit?  no   Family History  Problem Relation Age of Onset  . Kidney disease Mother   . Heart disease Father   . Diabetes Brother   . Alcohol abuse Brother   . Depression Brother   . Sarcoidosis Brother   . ALS Brother        Deceased, 92s  . Heart disease Brother   . Bipolar disorder Daughter    Social History   Socioeconomic History  . Marital status: Married    Spouse name: Not on file  . Number of children: 3  . Years of education: 86  . Highest education level: Not on file  Occupational History  . Occupation: retired  Scientific laboratory technician  . Financial resource strain: Not on file  . Food insecurity    Worry: Not on file    Inability: Not on  file  . Transportation needs    Medical: Not on file    Non-medical: Not on file  Tobacco Use  . Smoking status: Never Smoker  . Smokeless tobacco: Never Used  Substance and Sexual Activity  . Alcohol use: No    Alcohol/week: 0.0 standard drinks  . Drug use: No  . Sexual activity: Yes  Lifestyle  . Physical activity    Days per week: Not on file    Minutes per session: Not on file  . Stress: Not on file  Relationships  . Social Herbalist on phone: Not on file    Gets together: Not on file    Attends religious service: Not on file    Active member of club or organization: Not on file    Attends meetings of clubs or organizations: Not on file    Relationship status: Not on file  Other Topics Concern  . Not on file  Social History Narrative   She lives with husband and daughter.  Three grown children.   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     She took early retirement 39 because of problems of RSD.   Highest level of education:  Graduated high school   Past Surgical History:  Procedure Laterality Date  . A/V FISTULAGRAM N/A 09/20/2017   Procedure: A/V FISTULAGRAM - left arm;  Surgeon: Angelia Mould, MD;  Location: [facility];  Service: Cardiovascular;  Laterality: N/A;  . AV FISTULA PLACEMENT Left 02/16/2015   Procedure: LEFT ARM BRACHIOCEPHALIC (AV) FISTULA CREATION;  Surgeon: Mal Misty, MD;  Location: [facility];  Service: Vascular;  Laterality: Left;  . AV FISTULA PLACEMENT Left 06/24/2018   Procedure: INSERTION OF ARTERIOVENOUS (AV) GORE-TEX GRAFT LEFT UPPER ARM;  Surgeon: Elam Dutch, MD;  Location: [place];  Service: Vascular;  Laterality: Left;  . CHOLECYSTECTOMY    . INTRAOCULAR LENS INSERTION Right 10-21-13  . LIGATION OF COMPETING BRANCHES OF ARTERIOVENOUS FISTULA Left 02/16/2015   Procedure: LIGATION OF COMPETING BRANCHES OF LEFT BRACHIOCEPHALIC ARTERIOVENOUS FISTULA;  Surgeon: Mal Misty, MD;  Location: [place];   Service: Vascular;  Laterality: Left;  . PARTIAL HIP ARTHROPLASTY    . PARTIAL KNEE ARTHROPLASTY     left  . PERIPHERAL VASCULAR BALLOON ANGIOPLASTY Left 09/20/2017   Procedure: PERIPHERAL VASCULAR BALLOON ANGIOPLASTY;  Surgeon: Angelia Mould, MD;  Location: [facility];  Service: Cardiovascular;  Laterality: Left;  left fistulagram  . TEMPOROMANDIBULAR JOINT SURGERY    . TONSILLECTOMY    . TUBAL LIGATION     Past Medical History:  Diagnosis Date  . Arthritis   . Diabetes mellitus   . Diabetic neuropathy ([place])   . GERD (gastroesophageal reflux disease)   . Hypertension   . Kidney disease   . Pneumonia Oct. 2016  . Shortness of breath dyspnea    when I first lay down, then it gets better  . Sleep apnea    does not wear CPAP   . Thyroid disorder    BP (!) 167/71 (BP Location: Right Arm, Patient Position: Sitting, Cuff Size: Normal)   Pulse 61   Temp 98.8 F (37.1 C)   SpO2 98%   Opioid Risk Score:   Fall Risk Score:  `1  Depression screen PHQ 2/9  Depression screen [facility] 2/9 11/10/2018 07/17/2018 04/10/2018 02/07/2018 10/08/2017 08/19/2017 07/19/2017  Decreased Interest 0 1 0 0 - 0 0  Down, Depressed, Hopeless 0 1 0 0 0 0 0  PHQ - 2 Score 0 2 0 0 0 0 0  Altered sleeping - - - - - - -  Tired, decreased energy - - - - - - -  Change in appetite - - - - - - -  Feeling bad or failure about yourself  - - - - - - -  Trouble concentrating - - - - - - -  Moving slowly or fidgety/restless - - - - - - -  Suicidal thoughts - - - - - - -  PHQ-9 Score - - - - - - -  Some recent data might be hidden     Review of Systems  Constitutional: Negative.   HENT: Negative.   Eyes: Negative.   Respiratory: Negative.   Cardiovascular: Negative.   Gastrointestinal: Negative.   Endocrine: Negative.   Genitourinary: Negative.   Musculoskeletal: Positive for arthralgias, gait problem and myalgias.  Skin: Negative.   Allergic/Immunologic: Negative.   Neurological: Positive for  weakness.  Hematological: Negative.   Psychiatric/Behavioral: Negative.   All other systems reviewed and are negative.      Objective:   Physical Exam Vitals signs and nursing note reviewed.  Constitutional:      Appearance: Normal appearance.  Comments: Frail  Neurological:     Mental Status: She is alert.           Assessment & Plan:  1.Spinal stenosis chronic low back pain radiating into the legs:No Complaints Today. Continue Current medication regimen. Continue to monitor. 06/25//2020: 2. Diabetic Neuropathy: Continuecurrent medication regime withGabapentin Neurology Following.Marland Kitchen06/25/2020 3. Left Knee Pain: S/P Peripheral Nerve Block received 6-8 hours of relief.Continue Current Medication and exercise Regimen. Paige Arnold refuses Cryoablation. ( RSD) Continue Lidoderm Patches and Voltaren Gel.06//25/2020 4. Left Hip Fracture S/P hemiarthroplasty: Orthopedist Following.01/08/2019 5. Left Greater Trochanter Tenderness:No complaints today.Continue to Monitor.01/08/2019. 6. Chronic Pain Syndrome: Continue: Hydrocodone5/325 mgmg take one tablet by mouththree times dailyas needed for pain#90. She forgot her medication, stating her daughter dispenses her Hydrocodone, she forgot her bottle today. Mr. and Mrs. Mctighe was instructed to have their daughter call our office with her Hydrocodone count, they verbalize understanding.   We will continue the opioid monitoring program, this consists of regular clinic visits, examinations, urine drug screen, pill counts as well as use of [place] Controlled Substance Reporting System.  F/U in 1 month

## 2019-01-09 ENCOUNTER — Telehealth: Payer: Self-pay | Admitting: Registered Nurse

## 2019-01-09 DIAGNOSIS — L578 Other skin changes due to chronic exposure to nonionizing radiation: Secondary | ICD-10-CM | POA: Diagnosis not present

## 2019-01-09 DIAGNOSIS — L821 Other seborrheic keratosis: Secondary | ICD-10-CM | POA: Diagnosis not present

## 2019-01-09 MED ORDER — HYDROCODONE-ACETAMINOPHEN 5-325 MG PO TABS: 1.0000 | ORAL_TABLET | Freq: Three times a day (TID) | ORAL | 0 refills | Status: DC

## 2019-01-09 NOTE — Telephone Encounter (Signed)
Received a call from Paige Arnold Miss Careena Degraffenreid daughter. She reports Ms. Brys Hydrocodone was filled on 12/18/2018 and she has 26 pills. PMP was reviewed and Hydrocodone was filled on 12/18/2018. New prescription e-scribed. Paige Arnold was instructed to have Ms. Ventola bring her medication bottle to every visit, she verbalizes understanding.

## 2019-01-13 DIAGNOSIS — M6281 Muscle weakness (generalized): Secondary | ICD-10-CM | POA: Diagnosis not present

## 2019-01-13 DIAGNOSIS — M25562 Pain in left knee: Secondary | ICD-10-CM | POA: Diagnosis not present

## 2019-01-13 DIAGNOSIS — M545 Low back pain: Secondary | ICD-10-CM | POA: Diagnosis not present

## 2019-01-14 ENCOUNTER — Telehealth: Payer: Self-pay | Admitting: *Deleted

## 2019-01-14 LAB — DRUG TOX MONITOR 1 W/CONF, ORAL FLD
Amphetamines: NEGATIVE ng/mL (ref <10)
Barbiturates: NEGATIVE ng/mL (ref <10)
Benzodiazepines: NEGATIVE ng/mL (ref <0.50)
Buprenorphine: NEGATIVE ng/mL (ref <0.10)
Cocaine: NEGATIVE ng/mL (ref <5.0)
Codeine: NEGATIVE ng/mL (ref <2.5)
Dihydrocodeine: 2.9 ng/mL — ABNORMAL HIGH (ref <2.5)
Fentanyl: NEGATIVE ng/mL (ref <0.10)
Heroin Metabolite: NEGATIVE ng/mL (ref <1.0)
Hydrocodone: 41.2 ng/mL — ABNORMAL HIGH (ref <2.5)
Hydromorphone: NEGATIVE ng/mL (ref <2.5)
MARIJUANA: NEGATIVE ng/mL (ref <2.5)
MDMA: NEGATIVE ng/mL (ref <10)
Meprobamate: NEGATIVE ng/mL (ref <2.5)
Methadone: NEGATIVE ng/mL (ref <5.0)
Morphine: NEGATIVE ng/mL (ref <2.5)
Nicotine Metabolite: NEGATIVE ng/mL (ref <5.0)
Norhydrocodone: 2.9 ng/mL — ABNORMAL HIGH (ref <2.5)
Noroxycodone: NEGATIVE ng/mL (ref <2.5)
Opiates: POSITIVE ng/mL — AB (ref <2.5)
Oxycodone: NEGATIVE ng/mL (ref <2.5)
Oxymorphone: NEGATIVE ng/mL (ref <2.5)
Phencyclidine: NEGATIVE ng/mL (ref <10)
Tapentadol: NEGATIVE ng/mL (ref <5.0)
Tramadol: NEGATIVE ng/mL (ref <5.0)
Zolpidem: NEGATIVE ng/mL (ref <5.0)

## 2019-01-14 LAB — DRUG TOX ALC METAB W/CON, ORAL FLD: Alcohol Metabolite: NEGATIVE ng/mL (ref <25)

## 2019-01-14 NOTE — Telephone Encounter (Addendum)
Oral swab drug screen was consistent for prescribed medications.  ?

## 2019-01-17 DIAGNOSIS — E162 Hypoglycemia, unspecified: Secondary | ICD-10-CM | POA: Diagnosis not present

## 2019-01-17 DIAGNOSIS — E161 Other hypoglycemia: Secondary | ICD-10-CM | POA: Diagnosis not present

## 2019-01-17 DIAGNOSIS — R61 Generalized hyperhidrosis: Secondary | ICD-10-CM | POA: Diagnosis not present

## 2019-01-17 DIAGNOSIS — I160 Hypertensive urgency: Secondary | ICD-10-CM | POA: Diagnosis not present

## 2019-01-17 DIAGNOSIS — I129 Hypertensive chronic kidney disease with stage 1 through stage 4 chronic kidney disease, or unspecified chronic kidney disease: Secondary | ICD-10-CM | POA: Diagnosis not present

## 2019-01-17 DIAGNOSIS — R410 Disorientation, unspecified: Secondary | ICD-10-CM | POA: Diagnosis not present

## 2019-01-17 DIAGNOSIS — E11649 Type 2 diabetes mellitus with hypoglycemia without coma: Secondary | ICD-10-CM | POA: Diagnosis not present

## 2019-01-17 DIAGNOSIS — I10 Essential (primary) hypertension: Secondary | ICD-10-CM | POA: Diagnosis not present

## 2019-01-17 DIAGNOSIS — G9341 Metabolic encephalopathy: Secondary | ICD-10-CM | POA: Diagnosis not present

## 2019-01-18 DIAGNOSIS — N189 Chronic kidney disease, unspecified: Secondary | ICD-10-CM | POA: Diagnosis not present

## 2019-01-18 DIAGNOSIS — I160 Hypertensive urgency: Secondary | ICD-10-CM | POA: Diagnosis not present

## 2019-01-18 DIAGNOSIS — E162 Hypoglycemia, unspecified: Secondary | ICD-10-CM | POA: Diagnosis not present

## 2019-01-18 DIAGNOSIS — G9341 Metabolic encephalopathy: Secondary | ICD-10-CM | POA: Diagnosis not present

## 2019-01-19 DIAGNOSIS — Z7984 Long term (current) use of oral hypoglycemic drugs: Secondary | ICD-10-CM | POA: Diagnosis not present

## 2019-01-19 DIAGNOSIS — E162 Hypoglycemia, unspecified: Secondary | ICD-10-CM | POA: Diagnosis not present

## 2019-01-19 DIAGNOSIS — G9341 Metabolic encephalopathy: Secondary | ICD-10-CM | POA: Diagnosis not present

## 2019-01-19 DIAGNOSIS — N179 Acute kidney failure, unspecified: Secondary | ICD-10-CM | POA: Diagnosis not present

## 2019-01-19 DIAGNOSIS — I160 Hypertensive urgency: Secondary | ICD-10-CM | POA: Diagnosis not present

## 2019-01-19 DIAGNOSIS — I132 Hypertensive heart and chronic kidney disease with heart failure and with stage 5 chronic kidney disease, or end stage renal disease: Secondary | ICD-10-CM | POA: Diagnosis not present

## 2019-01-19 DIAGNOSIS — N185 Chronic kidney disease, stage 5: Secondary | ICD-10-CM | POA: Diagnosis not present

## 2019-01-19 DIAGNOSIS — E1122 Type 2 diabetes mellitus with diabetic chronic kidney disease: Secondary | ICD-10-CM | POA: Diagnosis not present

## 2019-01-19 DIAGNOSIS — I5030 Unspecified diastolic (congestive) heart failure: Secondary | ICD-10-CM | POA: Diagnosis not present

## 2019-01-19 DIAGNOSIS — N189 Chronic kidney disease, unspecified: Secondary | ICD-10-CM | POA: Diagnosis not present

## 2019-01-19 DIAGNOSIS — E11649 Type 2 diabetes mellitus with hypoglycemia without coma: Secondary | ICD-10-CM | POA: Diagnosis not present

## 2019-01-26 DIAGNOSIS — E1121 Type 2 diabetes mellitus with diabetic nephropathy: Secondary | ICD-10-CM | POA: Diagnosis not present

## 2019-01-26 DIAGNOSIS — N185 Chronic kidney disease, stage 5: Secondary | ICD-10-CM | POA: Diagnosis not present

## 2019-01-26 DIAGNOSIS — G8929 Other chronic pain: Secondary | ICD-10-CM | POA: Diagnosis not present

## 2019-01-26 DIAGNOSIS — I639 Cerebral infarction, unspecified: Secondary | ICD-10-CM | POA: Diagnosis not present

## 2019-01-26 DIAGNOSIS — Z992 Dependence on renal dialysis: Secondary | ICD-10-CM | POA: Diagnosis not present

## 2019-01-26 DIAGNOSIS — N189 Chronic kidney disease, unspecified: Secondary | ICD-10-CM | POA: Diagnosis not present

## 2019-01-26 DIAGNOSIS — N2581 Secondary hyperparathyroidism of renal origin: Secondary | ICD-10-CM | POA: Diagnosis not present

## 2019-01-26 DIAGNOSIS — D631 Anemia in chronic kidney disease: Secondary | ICD-10-CM | POA: Diagnosis not present

## 2019-01-26 DIAGNOSIS — I120 Hypertensive chronic kidney disease with stage 5 chronic kidney disease or end stage renal disease: Secondary | ICD-10-CM | POA: Diagnosis not present

## 2019-01-27 DIAGNOSIS — Z6829 Body mass index (BMI) 29.0-29.9, adult: Secondary | ICD-10-CM | POA: Diagnosis not present

## 2019-01-27 DIAGNOSIS — M545 Low back pain: Secondary | ICD-10-CM | POA: Diagnosis not present

## 2019-01-27 DIAGNOSIS — N189 Chronic kidney disease, unspecified: Secondary | ICD-10-CM | POA: Diagnosis not present

## 2019-01-27 DIAGNOSIS — E1143 Type 2 diabetes mellitus with diabetic autonomic (poly)neuropathy: Secondary | ICD-10-CM | POA: Diagnosis not present

## 2019-01-30 DIAGNOSIS — J189 Pneumonia, unspecified organism: Secondary | ICD-10-CM | POA: Diagnosis not present

## 2019-02-02 ENCOUNTER — Ambulatory Visit: Payer: Self-pay | Admitting: Neurology

## 2019-02-03 DIAGNOSIS — I10 Essential (primary) hypertension: Secondary | ICD-10-CM | POA: Diagnosis not present

## 2019-02-03 DIAGNOSIS — M545 Low back pain: Secondary | ICD-10-CM | POA: Diagnosis not present

## 2019-02-05 ENCOUNTER — Encounter: Payer: No Typology Code available for payment source | Admitting: Registered Nurse

## 2019-02-06 ENCOUNTER — Encounter: Payer: No Typology Code available for payment source | Admitting: Registered Nurse

## 2019-02-11 ENCOUNTER — Other Ambulatory Visit: Payer: Self-pay

## 2019-02-11 ENCOUNTER — Encounter: Payer: Self-pay | Admitting: Registered Nurse

## 2019-02-11 ENCOUNTER — Encounter: Payer: No Typology Code available for payment source | Attending: Registered Nurse | Admitting: Registered Nurse

## 2019-02-11 VITALS — BP 161/71 | HR 60 | Temp 98.1°F | Resp 12 | Ht 65.0 in | Wt 173.0 lb

## 2019-02-11 DIAGNOSIS — I770 Arteriovenous fistula, acquired: Secondary | ICD-10-CM | POA: Diagnosis not present

## 2019-02-11 DIAGNOSIS — M25562 Pain in left knee: Secondary | ICD-10-CM | POA: Diagnosis not present

## 2019-02-11 DIAGNOSIS — Z9049 Acquired absence of other specified parts of digestive tract: Secondary | ICD-10-CM | POA: Diagnosis not present

## 2019-02-11 DIAGNOSIS — I951 Orthostatic hypotension: Secondary | ICD-10-CM | POA: Diagnosis not present

## 2019-02-11 DIAGNOSIS — G90522 Complex regional pain syndrome I of left lower limb: Secondary | ICD-10-CM

## 2019-02-11 DIAGNOSIS — K219 Gastro-esophageal reflux disease without esophagitis: Secondary | ICD-10-CM | POA: Diagnosis not present

## 2019-02-11 DIAGNOSIS — E079 Disorder of thyroid, unspecified: Secondary | ICD-10-CM | POA: Insufficient documentation

## 2019-02-11 DIAGNOSIS — Z9889 Other specified postprocedural states: Secondary | ICD-10-CM | POA: Diagnosis not present

## 2019-02-11 DIAGNOSIS — G8929 Other chronic pain: Secondary | ICD-10-CM | POA: Diagnosis present

## 2019-02-11 DIAGNOSIS — Z811 Family history of alcohol abuse and dependence: Secondary | ICD-10-CM | POA: Diagnosis not present

## 2019-02-11 DIAGNOSIS — G4730 Sleep apnea, unspecified: Secondary | ICD-10-CM | POA: Insufficient documentation

## 2019-02-11 DIAGNOSIS — M7062 Trochanteric bursitis, left hip: Secondary | ICD-10-CM

## 2019-02-11 DIAGNOSIS — N289 Disorder of kidney and ureter, unspecified: Secondary | ICD-10-CM | POA: Diagnosis not present

## 2019-02-11 DIAGNOSIS — M48061 Spinal stenosis, lumbar region without neurogenic claudication: Secondary | ICD-10-CM

## 2019-02-11 DIAGNOSIS — Z833 Family history of diabetes mellitus: Secondary | ICD-10-CM | POA: Insufficient documentation

## 2019-02-11 DIAGNOSIS — E1140 Type 2 diabetes mellitus with diabetic neuropathy, unspecified: Secondary | ICD-10-CM | POA: Insufficient documentation

## 2019-02-11 DIAGNOSIS — M4800 Spinal stenosis, site unspecified: Secondary | ICD-10-CM | POA: Diagnosis not present

## 2019-02-11 DIAGNOSIS — G894 Chronic pain syndrome: Secondary | ICD-10-CM | POA: Diagnosis not present

## 2019-02-11 DIAGNOSIS — Z818 Family history of other mental and behavioral disorders: Secondary | ICD-10-CM | POA: Insufficient documentation

## 2019-02-11 DIAGNOSIS — M545 Low back pain: Secondary | ICD-10-CM | POA: Diagnosis not present

## 2019-02-11 DIAGNOSIS — I10 Essential (primary) hypertension: Secondary | ICD-10-CM | POA: Diagnosis not present

## 2019-02-11 DIAGNOSIS — Z96642 Presence of left artificial hip joint: Secondary | ICD-10-CM | POA: Diagnosis not present

## 2019-02-11 DIAGNOSIS — Z79899 Other long term (current) drug therapy: Secondary | ICD-10-CM

## 2019-02-11 DIAGNOSIS — Z5181 Encounter for therapeutic drug level monitoring: Secondary | ICD-10-CM

## 2019-02-11 DIAGNOSIS — Z8249 Family history of ischemic heart disease and other diseases of the circulatory system: Secondary | ICD-10-CM | POA: Diagnosis not present

## 2019-02-11 MED ORDER — HYDROCODONE-ACETAMINOPHEN 5-325 MG PO TABS: 1.0000 | ORAL_TABLET | Freq: Three times a day (TID) | ORAL | 0 refills | Status: AC

## 2019-02-11 NOTE — Progress Notes (Signed)
Subjective:    Patient ID: Paige Arnold, female    DOB: 1941-03-06, 78 y.o.   MRN: [id number]  HPI: Paige Arnold is a 78 y.o. female who returns for follow up appointment for chronic pain and medication refill. She states her pain is located in her lower back, left hip and left knee. She rates her pain 6. Her current exercise regime is walking.  Paige Arnold Morphine equivalent is 15.00  MME.  Last Oral Swab was Performed on 01/08/2019, it was consistent.   Mr. Chenoweth in room.   Pain Inventory Average Pain 6 Pain Right Now 6 My pain is sharp, burning, dull, stabbing, tingling and aching  In the last 24 hours, has pain interfered with the following? General activity 1 Relation with others 2 Enjoyment of life 5 What TIME of day is your pain at its worst? all the time Sleep (in general) n/a  Pain is worse with: walking, bending and standing Pain improves with: rest, heat/ice and medication Relief from Meds: 5  Mobility use a cane ability to climb steps?  no do you drive?  no needs help with transfers  Function disabled: date disabled n/a retired I need assistance with the following:  meal prep, household duties and shopping  Neuro/Psych trouble walking  Prior Studies no  Physicians involved in your care no   Family History  Problem Relation Age of Onset   Kidney disease Mother    Heart disease Father    Diabetes Brother    Alcohol abuse Brother    Depression Brother    Sarcoidosis Brother    ALS Brother        Deceased, 19s   Heart disease Brother    Bipolar disorder Daughter    Social History   Socioeconomic History   Marital status: Married    Spouse name: Not on file   Number of children: 3   Years of education: 12   Highest education level: Not on file  Occupational History   Occupation: retired  Scientist, product/process development strain: Not on file   Food insecurity    Worry: Not on file    Inability: Not on Energy manager needs    Medical: Not on file    Non-medical: Not on file  Tobacco Use   Smoking status: Never Smoker   Smokeless tobacco: Never Used  Substance and Sexual Activity   Alcohol use: No    Alcohol/week: 0.0 standard drinks   Drug use: No   Sexual activity: Yes  Lifestyle   Physical activity    Days per week: Not on file    Minutes per session: Not on file   Stress: Not on file  Relationships   Social connections    Talks on phone: Not on file    Gets together: Not on file    Attends religious service: Not on file    Active member of club or organization: Not on file    Attends meetings of clubs or organizations: Not on file    Relationship status: Not on file  Other Topics Concern   Not on file  Social History Narrative   She lives with husband and daughter.  Three grown children.   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     She took early retirement 5 because of problems of RSD.   Highest level of education:  Graduated high school   Past Surgical History:  Procedure Laterality  Date   A/V FISTULAGRAM N/A 09/20/2017   Procedure: A/V FISTULAGRAM - left arm;  Surgeon: Angelia Mould, MD;  Location: [place] [place] [facility];  Service: Cardiovascular;  Laterality: N/A;   AV FISTULA PLACEMENT Left 02/16/2015   Procedure: LEFT ARM BRACHIOCEPHALIC (AV) FISTULA CREATION;  Surgeon: Mal Misty, MD;  Location: [place];  Service: Vascular;  Laterality: Left;   AV FISTULA PLACEMENT Left 06/24/2018   Procedure: INSERTION OF ARTERIOVENOUS (AV) GORE-TEX GRAFT LEFT UPPER ARM;  Surgeon: Elam Dutch, MD;  Location: [place];  Service: Vascular;  Laterality: Left;   CHOLECYSTECTOMY     INTRAOCULAR LENS INSERTION Right 10-21-13   LIGATION OF COMPETING BRANCHES OF ARTERIOVENOUS FISTULA Left 02/16/2015   Procedure: LIGATION OF COMPETING BRANCHES OF LEFT BRACHIOCEPHALIC ARTERIOVENOUS FISTULA;  Surgeon: Mal Misty, MD;  Location: [place];  Service:  Vascular;  Laterality: Left;   PARTIAL HIP ARTHROPLASTY     PARTIAL KNEE ARTHROPLASTY     left   PERIPHERAL VASCULAR BALLOON ANGIOPLASTY Left 09/20/2017   Procedure: PERIPHERAL VASCULAR BALLOON ANGIOPLASTY;  Surgeon: Angelia Mould, MD;  Location: [facility];  Service: Cardiovascular;  Laterality: Left;  left fistulagram   TEMPOROMANDIBULAR JOINT SURGERY     TONSILLECTOMY     TUBAL LIGATION     Past Medical History:  Diagnosis Date   Arthritis    Diabetes mellitus    Diabetic neuropathy ([place])    GERD (gastroesophageal reflux disease)    Hypertension    Kidney disease    Pneumonia Oct. 2016   Shortness of breath dyspnea    when I first lay down, then it gets better   Sleep apnea    does not wear CPAP    Thyroid disorder    There were no vitals taken for this visit.  Opioid Risk Score:   Fall Risk Score:  `1  Depression screen PHQ 2/9  Depression screen [facility] 2/9 11/10/2018 07/17/2018 04/10/2018 02/07/2018 10/08/2017 08/19/2017 07/19/2017  Decreased Interest 0 1 0 0 - 0 0  Down, Depressed, Hopeless 0 1 0 0 0 0 0  PHQ - 2 Score 0 2 0 0 0 0 0  Altered sleeping - - - - - - -  Tired, decreased energy - - - - - - -  Change in appetite - - - - - - -  Feeling bad or failure about yourself  - - - - - - -  Trouble concentrating - - - - - - -  Moving slowly or fidgety/restless - - - - - - -  Suicidal thoughts - - - - - - -  PHQ-9 Score - - - - - - -  Some recent data might be hidden      Review of Systems  Genitourinary:       Painful urination  All other systems reviewed and are negative.      Objective:   Physical Exam Vitals signs and nursing note reviewed.  Constitutional:      Appearance: Normal appearance.  Neck:     Musculoskeletal: Normal range of motion and neck supple.  Cardiovascular:     Rate and Rhythm: Normal rate and regular rhythm.     Pulses: Normal pulses.     Heart sounds: Normal heart sounds.  Pulmonary:     Effort:  Pulmonary effort is normal.     Breath sounds: Normal breath sounds.  Musculoskeletal:     Comments: Normal Muscle Bulk and Muscle Testing Reveals:  Upper Extremities: Full ROM and Muscle Strength 5/5  Lumbar Paraspinal Tenderness: L-3-L-5 Left Greater Trochanter tenderness Lower Extremities: Full ROM and Muscle Strength 5/5 Left Lower Extremity Flexion Produces Pain into her Left Patella Arises from Table Slowly using cane for support Narrow Based Gait   Skin:    General: Skin is warm and dry.  Neurological:     Mental Status: She is alert and oriented to person, place, and time.  Psychiatric:        Mood and Affect: Mood normal.        Behavior: Behavior normal.           Assessment & Plan:  1.Spinal stenosis chronic low back pain radiating into the legs: Continue Current medication regimen. Continue to monitor. 07/29//2020: 2. Diabetic Neuropathy: Continuecurrent medication regime withGabapentin Neurology Following.Marland Kitchen07/29/2020 3. Left Knee Pain: S/P Peripheral Nerve Block received 6-8 hours of relief.Continue Current Medication and exercise Regimen. Paige Arnold refuses Cryoablation. ( RSD) Continue Lidoderm Patches and Voltaren Gel.07//29/2020 4. Left Hip Fracture S/P hemiarthroplasty: Orthopedist Following.02/11/2019 5. Left Greater Trochanter Tenderness:Continue to Monitor.02/11/2019. 6. Chronic Pain Syndrome:Continue: Hydrocodone5/325 mgmg take one tablet by mouththree times dailyas needed for pain#90.  We will continue the opioid monitoring program, this consists of regular clinic visits, examinations, urine drug screen, pill counts as well as use of [place] Controlled Substance Reporting System.  F/U in 1 month  15 minutes of face to face patient care time was spent during this visit. All questions were encouraged and answered.

## 2019-02-18 DIAGNOSIS — M545 Low back pain: Secondary | ICD-10-CM | POA: Diagnosis not present

## 2019-02-20 DIAGNOSIS — R42 Dizziness and giddiness: Secondary | ICD-10-CM | POA: Diagnosis not present

## 2019-02-25 DIAGNOSIS — N2581 Secondary hyperparathyroidism of renal origin: Secondary | ICD-10-CM | POA: Diagnosis not present

## 2019-02-25 DIAGNOSIS — N189 Chronic kidney disease, unspecified: Secondary | ICD-10-CM | POA: Diagnosis not present

## 2019-02-25 DIAGNOSIS — G8929 Other chronic pain: Secondary | ICD-10-CM | POA: Diagnosis not present

## 2019-02-25 DIAGNOSIS — N185 Chronic kidney disease, stage 5: Secondary | ICD-10-CM | POA: Diagnosis not present

## 2019-02-25 DIAGNOSIS — Z992 Dependence on renal dialysis: Secondary | ICD-10-CM | POA: Diagnosis not present

## 2019-02-25 DIAGNOSIS — I120 Hypertensive chronic kidney disease with stage 5 chronic kidney disease or end stage renal disease: Secondary | ICD-10-CM | POA: Diagnosis not present

## 2019-02-25 DIAGNOSIS — I639 Cerebral infarction, unspecified: Secondary | ICD-10-CM | POA: Diagnosis not present

## 2019-02-25 DIAGNOSIS — E1121 Type 2 diabetes mellitus with diabetic nephropathy: Secondary | ICD-10-CM | POA: Diagnosis not present

## 2019-02-25 DIAGNOSIS — D631 Anemia in chronic kidney disease: Secondary | ICD-10-CM | POA: Diagnosis not present

## 2019-03-02 DIAGNOSIS — J189 Pneumonia, unspecified organism: Secondary | ICD-10-CM | POA: Diagnosis not present

## 2019-03-07 ENCOUNTER — Encounter (HOSPITAL_COMMUNITY): Payer: Self-pay

## 2019-03-07 ENCOUNTER — Inpatient Hospital Stay (HOSPITAL_COMMUNITY): Payer: Medicare HMO

## 2019-03-07 ENCOUNTER — Telehealth: Payer: Self-pay | Admitting: Cardiology

## 2019-03-07 ENCOUNTER — Inpatient Hospital Stay (HOSPITAL_COMMUNITY)
Admission: AD | Admit: 2019-03-07 | Discharge: 2019-03-17 | DRG: 296 | Disposition: E | Payer: Medicare HMO | Source: Other Acute Inpatient Hospital | Attending: Pulmonary Disease | Admitting: Pulmonary Disease

## 2019-03-07 DIAGNOSIS — Z20828 Contact with and (suspected) exposure to other viral communicable diseases: Secondary | ICD-10-CM | POA: Diagnosis present

## 2019-03-07 DIAGNOSIS — R4189 Other symptoms and signs involving cognitive functions and awareness: Secondary | ICD-10-CM | POA: Diagnosis present

## 2019-03-07 DIAGNOSIS — G936 Cerebral edema: Secondary | ICD-10-CM | POA: Diagnosis present

## 2019-03-07 DIAGNOSIS — I469 Cardiac arrest, cause unspecified: Secondary | ICD-10-CM | POA: Diagnosis present

## 2019-03-07 DIAGNOSIS — Z992 Dependence on renal dialysis: Secondary | ICD-10-CM

## 2019-03-07 DIAGNOSIS — E039 Hypothyroidism, unspecified: Secondary | ICD-10-CM | POA: Diagnosis present

## 2019-03-07 DIAGNOSIS — R4020 Unspecified coma: Secondary | ICD-10-CM | POA: Diagnosis not present

## 2019-03-07 DIAGNOSIS — I2510 Atherosclerotic heart disease of native coronary artery without angina pectoris: Secondary | ICD-10-CM | POA: Diagnosis present

## 2019-03-07 DIAGNOSIS — G4730 Sleep apnea, unspecified: Secondary | ICD-10-CM | POA: Diagnosis present

## 2019-03-07 DIAGNOSIS — E119 Type 2 diabetes mellitus without complications: Secondary | ICD-10-CM | POA: Diagnosis not present

## 2019-03-07 DIAGNOSIS — E1165 Type 2 diabetes mellitus with hyperglycemia: Secondary | ICD-10-CM | POA: Diagnosis present

## 2019-03-07 DIAGNOSIS — M1990 Unspecified osteoarthritis, unspecified site: Secondary | ICD-10-CM | POA: Diagnosis present

## 2019-03-07 DIAGNOSIS — R4182 Altered mental status, unspecified: Secondary | ICD-10-CM | POA: Diagnosis not present

## 2019-03-07 DIAGNOSIS — E872 Acidosis: Secondary | ICD-10-CM | POA: Diagnosis not present

## 2019-03-07 DIAGNOSIS — E1122 Type 2 diabetes mellitus with diabetic chronic kidney disease: Secondary | ICD-10-CM | POA: Diagnosis present

## 2019-03-07 DIAGNOSIS — I120 Hypertensive chronic kidney disease with stage 5 chronic kidney disease or end stage renal disease: Secondary | ICD-10-CM | POA: Diagnosis present

## 2019-03-07 DIAGNOSIS — N185 Chronic kidney disease, stage 5: Secondary | ICD-10-CM | POA: Diagnosis not present

## 2019-03-07 DIAGNOSIS — R569 Unspecified convulsions: Secondary | ICD-10-CM | POA: Diagnosis present

## 2019-03-07 DIAGNOSIS — E785 Hyperlipidemia, unspecified: Secondary | ICD-10-CM | POA: Diagnosis present

## 2019-03-07 DIAGNOSIS — G8929 Other chronic pain: Secondary | ICD-10-CM | POA: Diagnosis present

## 2019-03-07 DIAGNOSIS — R0902 Hypoxemia: Secondary | ICD-10-CM | POA: Diagnosis not present

## 2019-03-07 DIAGNOSIS — Z4659 Encounter for fitting and adjustment of other gastrointestinal appliance and device: Secondary | ICD-10-CM

## 2019-03-07 DIAGNOSIS — J9690 Respiratory failure, unspecified, unspecified whether with hypoxia or hypercapnia: Secondary | ICD-10-CM | POA: Diagnosis not present

## 2019-03-07 DIAGNOSIS — J9600 Acute respiratory failure, unspecified whether with hypoxia or hypercapnia: Secondary | ICD-10-CM | POA: Diagnosis not present

## 2019-03-07 DIAGNOSIS — I6782 Cerebral ischemia: Secondary | ICD-10-CM | POA: Diagnosis not present

## 2019-03-07 DIAGNOSIS — Z8249 Family history of ischemic heart disease and other diseases of the circulatory system: Secondary | ICD-10-CM

## 2019-03-07 DIAGNOSIS — S0990XA Unspecified injury of head, initial encounter: Secondary | ICD-10-CM | POA: Diagnosis not present

## 2019-03-07 DIAGNOSIS — J9601 Acute respiratory failure with hypoxia: Secondary | ICD-10-CM

## 2019-03-07 DIAGNOSIS — N179 Acute kidney failure, unspecified: Secondary | ICD-10-CM | POA: Diagnosis not present

## 2019-03-07 DIAGNOSIS — L8990 Pressure ulcer of unspecified site, unspecified stage: Secondary | ICD-10-CM | POA: Diagnosis present

## 2019-03-07 DIAGNOSIS — Z8673 Personal history of transient ischemic attack (TIA), and cerebral infarction without residual deficits: Secondary | ICD-10-CM

## 2019-03-07 DIAGNOSIS — F329 Major depressive disorder, single episode, unspecified: Secondary | ICD-10-CM | POA: Diagnosis present

## 2019-03-07 DIAGNOSIS — G40409 Other generalized epilepsy and epileptic syndromes, not intractable, without status epilepticus: Secondary | ICD-10-CM | POA: Diagnosis not present

## 2019-03-07 DIAGNOSIS — Z9049 Acquired absence of other specified parts of digestive tract: Secondary | ICD-10-CM

## 2019-03-07 DIAGNOSIS — Z4682 Encounter for fitting and adjustment of non-vascular catheter: Secondary | ICD-10-CM | POA: Diagnosis not present

## 2019-03-07 DIAGNOSIS — K219 Gastro-esophageal reflux disease without esophagitis: Secondary | ICD-10-CM | POA: Diagnosis present

## 2019-03-07 DIAGNOSIS — G931 Anoxic brain damage, not elsewhere classified: Secondary | ICD-10-CM | POA: Diagnosis present

## 2019-03-07 DIAGNOSIS — S299XXA Unspecified injury of thorax, initial encounter: Secondary | ICD-10-CM | POA: Diagnosis not present

## 2019-03-07 DIAGNOSIS — Z03818 Encounter for observation for suspected exposure to other biological agents ruled out: Secondary | ICD-10-CM | POA: Diagnosis not present

## 2019-03-07 DIAGNOSIS — Z841 Family history of disorders of kidney and ureter: Secondary | ICD-10-CM

## 2019-03-07 DIAGNOSIS — N186 End stage renal disease: Secondary | ICD-10-CM | POA: Diagnosis present

## 2019-03-07 DIAGNOSIS — I340 Nonrheumatic mitral (valve) insufficiency: Secondary | ICD-10-CM | POA: Diagnosis not present

## 2019-03-07 DIAGNOSIS — G253 Myoclonus: Secondary | ICD-10-CM | POA: Diagnosis present

## 2019-03-07 DIAGNOSIS — I468 Cardiac arrest due to other underlying condition: Secondary | ICD-10-CM | POA: Diagnosis not present

## 2019-03-07 DIAGNOSIS — Z66 Do not resuscitate: Secondary | ICD-10-CM | POA: Diagnosis present

## 2019-03-07 DIAGNOSIS — E1140 Type 2 diabetes mellitus with diabetic neuropathy, unspecified: Secondary | ICD-10-CM | POA: Diagnosis present

## 2019-03-07 DIAGNOSIS — R231 Pallor: Secondary | ICD-10-CM | POA: Diagnosis not present

## 2019-03-07 DIAGNOSIS — G40901 Epilepsy, unspecified, not intractable, with status epilepticus: Secondary | ICD-10-CM | POA: Diagnosis not present

## 2019-03-07 DIAGNOSIS — S062X2A Diffuse traumatic brain injury with loss of consciousness of 31 minutes to 59 minutes, initial encounter: Secondary | ICD-10-CM | POA: Diagnosis not present

## 2019-03-07 DIAGNOSIS — I499 Cardiac arrhythmia, unspecified: Secondary | ICD-10-CM | POA: Diagnosis not present

## 2019-03-07 DIAGNOSIS — R404 Transient alteration of awareness: Secondary | ICD-10-CM | POA: Diagnosis not present

## 2019-03-07 LAB — LACTIC ACID, PLASMA: Lactic Acid, Venous: 4.8 mmol/L (ref 0.5–1.9)

## 2019-03-07 LAB — BASIC METABOLIC PANEL
Anion gap: 20 — ABNORMAL HIGH (ref 5–15)
BUN: 65 mg/dL — ABNORMAL HIGH (ref 8–23)
CO2: 16 mmol/L — ABNORMAL LOW (ref 22–32)
Calcium: 9 mg/dL (ref 8.9–10.3)
Chloride: 107 mmol/L (ref 98–111)
Creatinine, Ser: 7.2 mg/dL — ABNORMAL HIGH (ref 0.44–1.00)
GFR calc Af Amer: 6 mL/min — ABNORMAL LOW (ref >60)
GFR calc non Af Amer: 5 mL/min — ABNORMAL LOW (ref >60)
Glucose, Bld: 367 mg/dL — ABNORMAL HIGH (ref 70–99)
Potassium: 3.4 mmol/L — ABNORMAL LOW (ref 3.5–5.1)
Sodium: 143 mmol/L (ref 135–145)

## 2019-03-07 LAB — BLOOD GAS, ARTERIAL
Acid-base deficit: 9.2 mmol/L — ABNORMAL HIGH (ref 0.0–2.0)
Bicarbonate: 17.4 mmol/L — ABNORMAL LOW (ref 20.0–28.0)
Drawn by: 244901
FIO2: 100
MECHVT: 450 mL
O2 Saturation: 98.3 %
PEEP: 5 cmH2O
Patient temperature: 93.9
RATE: 16 resp/min
pCO2 arterial: 40.4 mmHg (ref 32.0–48.0)
pH, Arterial: 7.239 — ABNORMAL LOW (ref 7.350–7.450)
pO2, Arterial: 177 mmHg — ABNORMAL HIGH (ref 83.0–108.0)

## 2019-03-07 LAB — CBC
HCT: 41.3 % (ref 36.0–46.0)
Hemoglobin: 13.1 g/dL (ref 12.0–15.0)
MCH: 31.1 pg (ref 26.0–34.0)
MCHC: 31.7 g/dL (ref 30.0–36.0)
MCV: 98.1 fL (ref 80.0–100.0)
Platelets: 247 10*3/uL (ref 150–400)
RBC: 4.21 MIL/uL (ref 3.87–5.11)
RDW: 13 % (ref 11.5–15.5)
WBC: 26.4 10*3/uL — ABNORMAL HIGH (ref 4.0–10.5)
nRBC: 0 % (ref 0.0–0.2)

## 2019-03-07 LAB — GLUCOSE, CAPILLARY
Glucose-Capillary: 180 mg/dL — ABNORMAL HIGH (ref 70–99)
Glucose-Capillary: 263 mg/dL — ABNORMAL HIGH (ref 70–99)
Glucose-Capillary: 299 mg/dL — ABNORMAL HIGH (ref 70–99)
Glucose-Capillary: 391 mg/dL — ABNORMAL HIGH (ref 70–99)

## 2019-03-07 LAB — APTT: aPTT: 38 seconds — ABNORMAL HIGH (ref 24–36)

## 2019-03-07 LAB — TROPONIN I (HIGH SENSITIVITY)
Troponin I (High Sensitivity): 3264 ng/L (ref <18)
Troponin I (High Sensitivity): 4754 ng/L (ref <18)

## 2019-03-07 LAB — HEMOGLOBIN A1C
Hgb A1c MFr Bld: 7.3 % — ABNORMAL HIGH (ref 4.8–5.6)
Mean Plasma Glucose: 162.81 mg/dL

## 2019-03-07 LAB — PROTIME-INR
INR: 1.3 — ABNORMAL HIGH (ref 0.8–1.2)
Prothrombin Time: 15.7 seconds — ABNORMAL HIGH (ref 11.4–15.2)

## 2019-03-07 LAB — BETA-HYDROXYBUTYRIC ACID: Beta-Hydroxybutyric Acid: 0.23 mmol/L (ref 0.05–0.27)

## 2019-03-07 LAB — MRSA PCR SCREENING: MRSA by PCR: NEGATIVE

## 2019-03-07 MED ORDER — HEPARIN SODIUM (PORCINE) 5000 UNIT/ML IJ SOLN
5000.0000 [IU] | Freq: Three times a day (TID) | INTRAMUSCULAR | Status: DC
  Administered 2019-03-07 – 2019-03-10 (×10): 5000 [IU] via SUBCUTANEOUS
  Filled 2019-03-07 (×12): qty 1

## 2019-03-07 MED ORDER — PANTOPRAZOLE SODIUM 40 MG IV SOLR
40.0000 mg | Freq: Every day | INTRAVENOUS | Status: DC
  Administered 2019-03-07: 40 mg via INTRAVENOUS
  Filled 2019-03-07: qty 40

## 2019-03-07 MED ORDER — CHLORHEXIDINE GLUCONATE 0.12% ORAL RINSE (MEDLINE KIT)
15.0000 mL | Freq: Two times a day (BID) | OROMUCOSAL | Status: DC
  Administered 2019-03-07 – 2019-03-09 (×4): 15 mL via OROMUCOSAL
  Administered 2019-03-09: 20:00:00 via OROMUCOSAL
  Administered 2019-03-10 – 2019-03-11 (×3): 15 mL via OROMUCOSAL

## 2019-03-07 MED ORDER — SODIUM CHLORIDE 0.9 % IV SOLN
INTRAVENOUS | Status: DC
  Administered 2019-03-07: 50 mL/h via INTRAVENOUS

## 2019-03-07 MED ORDER — ASPIRIN 300 MG RE SUPP
300.0000 mg | RECTAL | Status: AC
  Administered 2019-03-07: 300 mg via RECTAL
  Filled 2019-03-07: qty 1

## 2019-03-07 MED ORDER — INSULIN ASPART 100 UNIT/ML ~~LOC~~ SOLN
0.0000 [IU] | SUBCUTANEOUS | Status: DC
  Administered 2019-03-07: 15 [IU] via SUBCUTANEOUS

## 2019-03-07 MED ORDER — HYDRALAZINE HCL 20 MG/ML IJ SOLN
10.0000 mg | INTRAMUSCULAR | Status: DC | PRN
  Administered 2019-03-08 (×3): 10 mg via INTRAVENOUS
  Filled 2019-03-07 (×3): qty 1

## 2019-03-07 MED ORDER — LEVOTHYROXINE SODIUM 75 MCG PO TABS
150.0000 ug | ORAL_TABLET | Freq: Every day | ORAL | Status: DC
  Administered 2019-03-08 – 2019-03-10 (×3): 150 ug
  Filled 2019-03-07 (×4): qty 2

## 2019-03-07 MED ORDER — CHLORHEXIDINE GLUCONATE CLOTH 2 % EX PADS
6.0000 | MEDICATED_PAD | Freq: Every day | CUTANEOUS | Status: DC
  Administered 2019-03-07 – 2019-03-11 (×6): 6 via TOPICAL

## 2019-03-07 MED ORDER — NOREPINEPHRINE 4 MG/250ML-% IV SOLN: 0.0000 ug/min | INTRAVENOUS | Status: DC

## 2019-03-07 MED ORDER — ORAL CARE MOUTH RINSE
15.0000 mL | OROMUCOSAL | Status: DC
  Administered 2019-03-07 – 2019-03-11 (×37): 15 mL via OROMUCOSAL

## 2019-03-07 MED ORDER — LEVOTHYROXINE SODIUM 75 MCG PO TABS: 150.0000 ug | ORAL_TABLET | Freq: Every day | ORAL | Status: DC

## 2019-03-07 MED ORDER — INSULIN REGULAR(HUMAN) IN NACL 100-0.9 UT/100ML-% IV SOLN
INTRAVENOUS | Status: DC
  Administered 2019-03-07: 2.7 [IU]/h via INTRAVENOUS
  Administered 2019-03-08: 2.6 [IU]/h via INTRAVENOUS
  Filled 2019-03-07 (×2): qty 100

## 2019-03-07 NOTE — Progress Notes (Signed)
Fentanyl 48ml wasted in stericycle witnessed by Griffin Dakin, RN

## 2019-03-07 NOTE — Consult Note (Addendum)
Neurology Consultation Reason for Consult: Concern for seizure Referring Physician: Jerrye Bushy  CC: Abnormal movements  History is obtained from: Chart review  HPI: Paige Arnold is a 78 y.o. female with a history of diabetes who was found at home drooling in her chair.  EMS was called and after EMS arrival she developed PEA/asystole.  Holter monitor showed bradycardia progressing to asystole.  She had a 40-minute downtime.  Since arrival, she has begun having some abnormal movements and therefore neurology has been consulted for consideration of stat EEG.    ROS:  Unable to obtain due to altered mental status.   Past Medical History:  Diagnosis Date  . Arthritis   . Diabetes mellitus   . Diabetic neuropathy ([place])   . GERD (gastroesophageal reflux disease)   . Hypertension   . Kidney disease   . Pneumonia Oct. 2016  . Shortness of breath dyspnea    when I first lay down, then it gets better  . Sleep apnea    does not wear CPAP   . Thyroid disorder      Family History  Problem Relation Age of Onset  . Kidney disease Mother   . Heart disease Father   . Diabetes Brother   . Alcohol abuse Brother   . Depression Brother   . Sarcoidosis Brother   . ALS Brother        Deceased, 18s  . Heart disease Brother   . Bipolar disorder Daughter      Social History:  reports that she has never smoked. She has never used smokeless tobacco. She reports that she does not drink alcohol or use drugs.   Exam: Current vital signs: BP (!) 160/82   Pulse 82   Temp 97.7 F (36.5 C)   Resp 16   Ht 5\' 5"  (1.651 m)   SpO2 100%   BMI 28.79 kg/m  Vital signs in last 24 hours: Temp:  [93.4 F (34.1 C)-97.7 F (36.5 C)] 97.7 F (36.5 C) (08/22 2100) Pulse Rate:  [76-89] 82 (08/22 2100) Resp:  [0-20] 16 (08/22 2100) BP: (149-170)/(68-83) 160/82 (08/22 2100) SpO2:  [91 %-100 %] 100 % (08/22 2100) FiO2 (%):  [60 %-100 %] 60 % (08/22 2006)   Physical Exam  Constitutional: Appears  well-developed and well-nourished.  Psych: Affect appropriate to situation Eyes: No scleral injection HENT: No OP obstrucion Head: Normocephalic.  Cardiovascular: Normal rate and regular rhythm.  Respiratory: Effort normal, non-labored breathing GI: Soft.  No distension. There is no tenderness.  Skin: WDI  Neuro: Mental Status: Patient is comatose.  She does not open eyes with the exception of occasionally as part of myoclonus. Cranial Nerves: II: Does not blink to threat pupils are equal, round, and reactive to light.   III,IV, VI: Doll's eye maneuver elicits normal response V:VII: Corneals are intact bilaterally Motor: No movement to noxious stimulation Sensory: As above  deep Tendon Reflexes: 2+ and symmetric in the biceps and patellae.  Cerebellar: Does not perform  She has occasional facial twitching associated with eye opening and upward eye bobbing.  I have reviewed labs in epic and the results pertinent to this consultation are: Creatinine 7.2  Impression: 78 year old female with likely anoxic injury.  Following my initial examination, with concern for confusion prior to arrest, there was concern for possible ongoing seizure activity and therefore a stat EEG was obtained which demonstrates a burst suppression pattern.  The bursts are not identical/epileptic, so we will continue to monitor, but  I strongly suspect poor prognosis at this time.  There is no evidence of ongoing seizure activity on the initial EEG.  Recommendations: 1) continue EEG, can discontinue if no seizures by morning 2) CT head in the morning   This patient is critically ill and at significant risk of neurological worsening, death and care requires constant monitoring of vital signs, hemodynamics,respiratory and cardiac monitoring, neurological assessment, discussion with family, other specialists and medical decision making of high complexity. I spent 60 minutes of neurocritical care time  in the  care of  this patient. This was time spent independent of any time provided by nurse practitioner or PA.  Roland Rack, MD [facility] [phone]  If 7pm- 7am, please page neurology on call as listed in [place]. 03/10/2019  9:28 PM

## 2019-03-07 NOTE — Progress Notes (Signed)
Spoke w/ daughter Kelli Churn [phone]  We discussed current plan of care.   As of now: She is full code She wishes to talk to her family further about this. She understands that she is NOT a dialysis candidate.  If we learn over the next few days that she has indeed had a devastating neurological injury she would then want to re-discuss this with her family specifically about goals of care as she would not want her mother on vent for prolonged period of time  Erick Colace ACNP-BC [place] Pager # [phone] OR # [phone] if no answer

## 2019-03-07 NOTE — H&P (Signed)
NAME:  Paige Arnold, MRN:  [id number], DOB:  07-18-1940, LOS: 0 ADMISSION DATE:  03/10/2019, CONSULTATION DATE:  8/22 REFERRING MD:  Colin Rhein MD from [facility], CHIEF COMPLAINT:  Cardiac arrest   Brief History   78 year old female patient with multiple medical comorbidities being admitted to [facility] 8/22 following PEA arrest.  Verbally reported time to return of spontaneous circulation estimated at 40 minutes  History of present illness   78 year old female patient with multiple medical core morbidities as mentioned below.  She was found by family the a.m. of 8/22 slumped over and drooling.  Apparently initially was minimally responsive, EMS was called.  Upon EMS arrival she had loss of consciousness and loss of pulse CPR was initiated.  She had recently been started on a Holter monitor EKG rhythm reported as bradycardia arrhythmia progressing to asystole.  ACLS was initiated by the EMS team she was intubated for airway protection on arrival pulses were palpable estimated EMS time to return of spontaneous circulation was approximately 2 minutes (but verbal report was 30-40 minutes).   Past Medical History  Hypertension, hypothyroidism, prior CVA, diabetes, stage V chronic kidney disease/end-stage renal disease(has a left upper extremity AV fistula), chronic pain, multifactorial cognitive impairment felt secondary to depression and stroke  Significant Hospital Events   8/22: Admitted normothermia protocol initiated  Consults:  8/22: Nephrology consult  Procedures:  8/22 endotracheal tube 8/22 left femoral triple-lumen catheter 8/22 right lower extremity intraosseous catheter   Significant Diagnostic Tests:  CT brain 8/22 negative   Micro Data:  UC 8/22>>> BC 8/22>>>  Antimicrobials:     Interim history/subjective:  Unresponsive fent gtt stopped  Objective   Height 5\' 5"  (1.651 m).    Vent Mode: PRVC FiO2 (%):  [100 %] 100 % Set Rate:  [16 bmp] 16 bmp Vt Set:  [450 mL]  450 mL PEEP:  [5 cmH20] 5 cmH20 Plateau Pressure:  [15 cmH20] 15 cmH20  No intake or output data in the 24 hours ending 02/15/2019 1516 There were no vitals filed for this visit.  Examination: General: Obese 78 year old female currently unresponsive on full ventilatory support HENT: Normocephalic atraumatic orally intubated sclera nonicteric bilateral upward gaze Lungs: Scattered rhonchi equal chest rise bilaterally Cardiovascular: Regular rate and rhythm Abdomen: Obese hypoactive bowel sounds Extremities: Palpable pulses right lower extremity with intraosseous tube left groin with triple-lumen catheter left upper extremity AV graft with pulse but no strong bruit or thrill Neuro: Intermittently will have what appears to be limited myoclonic jerking response of the left lower extremity she does have cough no gag GU: Due to void  Resolved Hospital Problem list     Assessment & Plan:   Cardiopulmonary PEA arrest.  -Has a history of coronary artery disease as well as hypertension Plan Cont tele  ECHO Cycle CEs Repeating labs  Consider cards consult depending on neuro recovery  Dc amiodarone gtt  Acute anoxic brain injury s/p prolonged cardiac arrest->verbally reported as 40 minutes Plan Normothermia protocol  Avoid fever RASS goal 0 to -1 EEG Repeat CT brain at 48 hrs   Acute hypoxic respiratory failure s/p cardiopulmonary arrest Plan  Cont full vent support F/u abg cxr s/p transport  VAP bundle   Stage V CKD. Recently dialyzed 3 weeks prior to admit. Not since.  Plan Nephrology consult Strict I&O Place foley  Serial chem Renal dose meds   Anion Gap metabolic acidosis  Plan Ck lactic acid Repeat chem Ck beta hydroxybutyric acid  DM w/  hyperglycemia Plan ssi Ck a1c   Anemia  Plan F/u cbc  Mild leukocytosis ->prob reactive Plan F/u am chem  Best practice:  Diet: NPO Pain/Anxiety/Delirium protocol (if indicated): 8/22 VAP protocol (if indicated):  8/22 DVT prophylaxis: [place] heparin GI prophylaxis: PPI Glucose control: ssi Mobility: BR Code Status: full code Family Communication: pending (daughter is Kelli Churn: [phone] Disposition: critically ill s/p prolonged PEA arrest. Will speak to renal. Has metabolic acidosis which is likely 2/2 lactic acid after cardiac arrest. Other wise no clear indication for HD and w/ prolonged down not sure this is something that should be offered. Also seems to have clotted AVG so would need temp access to provide. Will touch base w/ daughter. Care outline supportive at this time.   Labs   CBC: No results for input(s): WBC, NEUTROABS, HGB, HCT, MCV, PLT in the last 168 hours.  Basic Metabolic Panel: No results for input(s): NA, K, CL, CO2, GLUCOSE, BUN, CREATININE, CALCIUM, MG, PHOS in the last 168 hours. GFR: CrCl cannot be calculated (Patient's most recent lab result is older than the maximum 21 days allowed.). No results for input(s): PROCALCITON, WBC, LATICACIDVEN in the last 168 hours.  Liver Function Tests: No results for input(s): AST, ALT, ALKPHOS, BILITOT, PROT, ALBUMIN in the last 168 hours. No results for input(s): LIPASE, AMYLASE in the last 168 hours. No results for input(s): AMMONIA in the last 168 hours.  ABG    Component Value Date/Time   PHART 7.423 06/17/2018 0839   PCO2ART 39.8 06/17/2018 0839   PO2ART 96.0 06/17/2018 0839   HCO3 25.5 06/17/2018 0839   TCO2 32 09/20/2017 0626   O2SAT 96.8 06/17/2018 0839     Coagulation Profile: No results for input(s): INR, PROTIME in the last 168 hours.  Cardiac Enzymes: No results for input(s): CKTOTAL, CKMB, CKMBINDEX, TROPONINI in the last 168 hours.  HbA1C: Hgb A1c MFr Bld  Date/Time Value Ref Range Status  06/20/2018 02:55 AM 7.5 (H) 4.8 - 5.6 % Final    Comment:    (NOTE) Pre diabetes:          5.7%-6.4% Diabetes:              >6.4% Glycemic control for   <7.0% adults with diabetes   06/16/2018 12:02 AM 7.5 (H)  4.8 - 5.6 % Final    Comment:    (NOTE) Pre diabetes:          5.7%-6.4% Diabetes:              >6.4% Glycemic control for   <7.0% adults with diabetes     CBG: No results for input(s): GLUCAP in the last 168 hours.  Review of Systems:   Not able   Past Medical History  She,  has a past medical history of Arthritis, Diabetes mellitus, Diabetic neuropathy ([place]), GERD (gastroesophageal reflux disease), Hypertension, Kidney disease, Pneumonia (Oct. 2016), Shortness of breath dyspnea, Sleep apnea, and Thyroid disorder.   Surgical History    Past Surgical History:  Procedure Laterality Date  . A/V FISTULAGRAM N/A 09/20/2017   Procedure: A/V FISTULAGRAM - left arm;  Surgeon: Angelia Mould, MD;  Location: [facility];  Service: Cardiovascular;  Laterality: N/A;  . AV FISTULA PLACEMENT Left 02/16/2015   Procedure: LEFT ARM BRACHIOCEPHALIC (AV) FISTULA CREATION;  Surgeon: Mal Misty, MD;  Location: [facility];  Service: Vascular;  Laterality: Left;  . AV FISTULA PLACEMENT Left 06/24/2018   Procedure: INSERTION OF ARTERIOVENOUS (AV) GORE-TEX GRAFT  LEFT UPPER ARM;  Surgeon: Elam Dutch, MD;  Location: [facility];  Service: Vascular;  Laterality: Left;  . CHOLECYSTECTOMY    . INTRAOCULAR LENS INSERTION Right 10-21-13  . LIGATION OF COMPETING BRANCHES OF ARTERIOVENOUS FISTULA Left 02/16/2015   Procedure: LIGATION OF COMPETING BRANCHES OF LEFT BRACHIOCEPHALIC ARTERIOVENOUS FISTULA;  Surgeon: Mal Misty, MD;  Location: [facility];  Service: Vascular;  Laterality: Left;  . PARTIAL HIP ARTHROPLASTY    . PARTIAL KNEE ARTHROPLASTY     left  . PERIPHERAL VASCULAR BALLOON ANGIOPLASTY Left 09/20/2017   Procedure: PERIPHERAL VASCULAR BALLOON ANGIOPLASTY;  Surgeon: Angelia Mould, MD;  Location: [facility];  Service: Cardiovascular;  Laterality: Left;  left fistulagram  . TEMPOROMANDIBULAR JOINT SURGERY    . TONSILLECTOMY    . TUBAL LIGATION       Social History   reports  that she has never smoked. She has never used smokeless tobacco. She reports that she does not drink alcohol or use drugs.   Family History   Her family history includes ALS in her brother; Alcohol abuse in her brother; Bipolar disorder in her daughter; Depression in her brother; Diabetes in her brother; Heart disease in her brother and father; Kidney disease in her mother; Sarcoidosis in her brother.   Allergies Allergies  Allergen Reactions  . Oxycodone Other (See Comments)    [place] Record - Per pain clinic Dr, "pt to no take, has taken too long"  . Sulfa Antibiotics Itching     Home Medications  Prior to Admission medications   Medication Sig Start Date End Date Taking? Authorizing Provider  aspirin EC 81 MG EC tablet Take 1 tablet (81 mg total) by mouth daily. 06/26/18   Domenic Polite, MD  cloNIDine (CATAPRES) 0.1 MG tablet Take 0.1 mg by mouth 2 (two) times daily.     [provider]  donepezil (ARICEPT) 10 MG tablet TAKE 1 TABLET BY MOUTH EVERY DAY AT BEDTIME 11/25/18   Narda Amber K, DO  Dulaglutide (TRULICITY) 1.5 0000000 SOPN Inject 1.5 mg into the skin every Sunday.    [provider]  DULoxetine (CYMBALTA) 60 MG capsule Take 60 mg by mouth daily.    [provider]  furosemide (LASIX) 80 MG tablet Take 80 mg by mouth 2 (two) times daily.     [provider]  gabapentin (NEURONTIN) 100 MG capsule Take 1 capsule (100 mg total) by mouth 2 (two) times daily. 06/25/18   Domenic Polite, MD  glimepiride (AMARYL) 4 MG tablet Take 4 mg by mouth daily with breakfast.    [provider]  HYDROcodone-acetaminophen (NORCO/VICODIN) 5-325 MG tablet Take 1 tablet by mouth 3 (three) times daily. 02/11/19   Bayard Hugger, NP  isosorbide mononitrate (IMDUR) 60 MG 24 hr tablet Take 1 tablet (60 mg total) by mouth daily. 06/26/18   Domenic Polite, MD  levothyroxine (SYNTHROID, LEVOTHROID) 125 MCG tablet Take 150 mcg by mouth daily before  breakfast.     [provider]  Multiple Vitamins-Minerals (CENTRUM SILVER PO) Take 1 tablet by mouth daily.    [provider]  Omega-3 Fatty Acids (FISH OIL) 1000 MG CAPS Take 1,200 mg by mouth 2 (two) times daily.     [provider]  polyethylene glycol (MIRALAX / GLYCOLAX) packet Take 17 g by mouth daily. 06/25/18   Domenic Polite, MD  rosuvastatin (CRESTOR) 10 MG tablet Take 10 mg by mouth daily.    [provider]  vitamin  B-12 (CYANOCOBALAMIN) 1000 MCG tablet Take 1,000 mcg by mouth daily.    [provider]     Critical care time:  35 minutes   Erick Colace ACNP-BC [place] Pager # [phone] OR # [phone] if no answer

## 2019-03-07 NOTE — Progress Notes (Signed)
EEG completed, results pending. 

## 2019-03-07 NOTE — Progress Notes (Signed)
Elink called to verify TTM/normothermia orders. Awaiting MD call back

## 2019-03-07 NOTE — Progress Notes (Signed)
This chaplain responded to Pt. Code Cool.  The chaplain checked in with the Pt. RN-Jennifer.  The chaplain understands from the RN no spiritual care needs at this time.  F/U spiritual care is available as needed.

## 2019-03-07 NOTE — Progress Notes (Signed)
vLTM started following stat EEG. No skin breakdown/ notified neuro

## 2019-03-07 NOTE — Progress Notes (Signed)
CRITICAL VALUE ALERT  Critical Value:  K=3.4, Cre=7.2, Trop=3264, lactic=4.8  Date & Time Notied:  02/15/2019 @1838   Provider Notified: Governor Rooks MD  Orders Received/Actions taken: No new orders

## 2019-03-07 NOTE — Progress Notes (Signed)
[facility] notified about pt's continued HTN, MAP in the 123XX123 and systolic 123XX123. Waiting for order clarification/new orders/call back. Will continue to monitor.

## 2019-03-07 NOTE — Progress Notes (Signed)
Daughter called for updates. She is the main contact person. Set up password.

## 2019-03-07 NOTE — Progress Notes (Signed)
eLink Physician-Brief Progress Note Patient Name: Paige Arnold DOB: April 28, 1941 MRN: [id number]   Date of Service  03/12/2019  HPI/Events of Note  Hypertension - BP = 162/77.   eICU Interventions  Will order: 1. Hydralazine 10 mg IV Q 4 hours PRN SBP > 170 or DBP > 100.     Intervention Category Major Interventions: Hypertension - evaluation and management  [name] 02/18/2019, 11:36 PM

## 2019-03-07 NOTE — Telephone Encounter (Signed)
    Received outpatient page from [place] [facility] regarding bradycardia arrhythmia with asystole greater than 1 minute early this a.m.  Monitoring company called patient and spoke with daughter who reports EMS had already been called and patient received CPR and was in route to a local facility, unknown to monitoring company.  Kathyrn Drown NP-C [place] Pager: [phone]

## 2019-03-07 NOTE — Progress Notes (Signed)
[facility] provider called back and checked orders, keeping pt at 36 TTM protocol

## 2019-03-08 ENCOUNTER — Inpatient Hospital Stay (HOSPITAL_COMMUNITY): Payer: Medicare HMO

## 2019-03-08 DIAGNOSIS — G40409 Other generalized epilepsy and epileptic syndromes, not intractable, without status epilepticus: Secondary | ICD-10-CM

## 2019-03-08 DIAGNOSIS — L8990 Pressure ulcer of unspecified site, unspecified stage: Secondary | ICD-10-CM | POA: Insufficient documentation

## 2019-03-08 DIAGNOSIS — I340 Nonrheumatic mitral (valve) insufficiency: Secondary | ICD-10-CM

## 2019-03-08 DIAGNOSIS — G40901 Epilepsy, unspecified, not intractable, with status epilepticus: Secondary | ICD-10-CM

## 2019-03-08 LAB — GLUCOSE, CAPILLARY
Glucose-Capillary: 326 mg/dL — ABNORMAL HIGH (ref 70–99)
Glucose-Capillary: 286 mg/dL — ABNORMAL HIGH (ref 70–99)
Glucose-Capillary: 217 mg/dL — ABNORMAL HIGH (ref 70–99)
Glucose-Capillary: 206 mg/dL — ABNORMAL HIGH (ref 70–99)
Glucose-Capillary: 177 mg/dL — ABNORMAL HIGH (ref 70–99)
Glucose-Capillary: 175 mg/dL — ABNORMAL HIGH (ref 70–99)
Glucose-Capillary: 161 mg/dL — ABNORMAL HIGH (ref 70–99)
Glucose-Capillary: 150 mg/dL — ABNORMAL HIGH (ref 70–99)
Glucose-Capillary: 124 mg/dL — ABNORMAL HIGH (ref 70–99)
Glucose-Capillary: 119 mg/dL — ABNORMAL HIGH (ref 70–99)
Glucose-Capillary: 128 mg/dL — ABNORMAL HIGH (ref 70–99)
Glucose-Capillary: 136 mg/dL — ABNORMAL HIGH (ref 70–99)
Glucose-Capillary: 163 mg/dL — ABNORMAL HIGH (ref 70–99)
Glucose-Capillary: 142 mg/dL — ABNORMAL HIGH (ref 70–99)
Glucose-Capillary: 153 mg/dL — ABNORMAL HIGH (ref 70–99)

## 2019-03-08 LAB — BASIC METABOLIC PANEL
Anion gap: 14 (ref 5–15)
Anion gap: 15 (ref 5–15)
BUN: 65 mg/dL — ABNORMAL HIGH (ref 8–23)
BUN: 72 mg/dL — ABNORMAL HIGH (ref 8–23)
CO2: 19 mmol/L — ABNORMAL LOW (ref 22–32)
CO2: 20 mmol/L — ABNORMAL LOW (ref 22–32)
Calcium: 8.9 mg/dL (ref 8.9–10.3)
Calcium: 8.5 mg/dL — ABNORMAL LOW (ref 8.9–10.3)
Chloride: 112 mmol/L — ABNORMAL HIGH (ref 98–111)
Chloride: 111 mmol/L (ref 98–111)
Creatinine, Ser: 7.19 mg/dL — ABNORMAL HIGH (ref 0.44–1.00)
Creatinine, Ser: 7.66 mg/dL — ABNORMAL HIGH (ref 0.44–1.00)
GFR calc Af Amer: 6 mL/min — ABNORMAL LOW (ref >60)
GFR calc Af Amer: 5 mL/min — ABNORMAL LOW (ref >60)
GFR calc non Af Amer: 5 mL/min — ABNORMAL LOW (ref >60)
GFR calc non Af Amer: 5 mL/min — ABNORMAL LOW (ref >60)
Glucose, Bld: 143 mg/dL — ABNORMAL HIGH (ref 70–99)
Glucose, Bld: 167 mg/dL — ABNORMAL HIGH (ref 70–99)
Potassium: 3.8 mmol/L (ref 3.5–5.1)
Potassium: 5.6 mmol/L — ABNORMAL HIGH (ref 3.5–5.1)
Sodium: 145 mmol/L (ref 135–145)
Sodium: 146 mmol/L — ABNORMAL HIGH (ref 135–145)

## 2019-03-08 LAB — POCT I-STAT 7, (LYTES, BLD GAS, ICA,H+H)
Acid-base deficit: 9 mmol/L — ABNORMAL HIGH (ref 0.0–2.0)
Bicarbonate: 18 mmol/L — ABNORMAL LOW (ref 20.0–28.0)
Calcium, Ion: 1.25 mmol/L (ref 1.15–1.40)
HCT: 41 % (ref 36.0–46.0)
Hemoglobin: 13.9 g/dL (ref 12.0–15.0)
O2 Saturation: 99 %
Patient temperature: 36.6
Potassium: 3.7 mmol/L (ref 3.5–5.1)
Sodium: 146 mmol/L — ABNORMAL HIGH (ref 135–145)
TCO2: 19 mmol/L — ABNORMAL LOW (ref 22–32)
pCO2 arterial: 39.4 mmHg (ref 32.0–48.0)
pH, Arterial: 7.267 — ABNORMAL LOW (ref 7.350–7.450)
pO2, Arterial: 159 mmHg — ABNORMAL HIGH (ref 83.0–108.0)

## 2019-03-08 LAB — PROTIME-INR
INR: 1.3 — ABNORMAL HIGH (ref 0.8–1.2)
Prothrombin Time: 15.7 seconds — ABNORMAL HIGH (ref 11.4–15.2)

## 2019-03-08 LAB — ECHOCARDIOGRAM COMPLETE
Height: 65 in
Weight: 3114.66 oz

## 2019-03-08 LAB — CBC
HCT: 43.4 % (ref 36.0–46.0)
Hemoglobin: 13.9 g/dL (ref 12.0–15.0)
MCH: 31.2 pg (ref 26.0–34.0)
MCHC: 32 g/dL (ref 30.0–36.0)
MCV: 97.5 fL (ref 80.0–100.0)
Platelets: 246 10*3/uL (ref 150–400)
RBC: 4.45 MIL/uL (ref 3.87–5.11)
RDW: 13.2 % (ref 11.5–15.5)
WBC: 25.8 10*3/uL — ABNORMAL HIGH (ref 4.0–10.5)
nRBC: 0 % (ref 0.0–0.2)

## 2019-03-08 LAB — PHOSPHORUS: Phosphorus: 5.7 mg/dL — ABNORMAL HIGH (ref 2.5–4.6)

## 2019-03-08 LAB — APTT: aPTT: 35 seconds (ref 24–36)

## 2019-03-08 LAB — MAGNESIUM: Magnesium: 2.3 mg/dL (ref 1.7–2.4)

## 2019-03-08 MED ORDER — INSULIN ASPART 100 UNIT/ML ~~LOC~~ SOLN
1.0000 [IU] | SUBCUTANEOUS | Status: DC
  Administered 2019-03-08: 1 [IU] via SUBCUTANEOUS
  Administered 2019-03-08 (×2): 2 [IU] via SUBCUTANEOUS
  Administered 2019-03-09: 1 [IU] via SUBCUTANEOUS

## 2019-03-08 MED ORDER — PERFLUTREN LIPID MICROSPHERE
1.0000 mL | INTRAVENOUS | Status: AC | PRN
  Administered 2019-03-08: 2 mL via INTRAVENOUS
  Filled 2019-03-08: qty 10

## 2019-03-08 MED ORDER — PANTOPRAZOLE SODIUM 40 MG PO PACK
40.0000 mg | PACK | ORAL | Status: DC
  Administered 2019-03-08 – 2019-03-11 (×4): 40 mg
  Filled 2019-03-08 (×4): qty 20

## 2019-03-08 MED ORDER — INSULIN DETEMIR 100 UNIT/ML ~~LOC~~ SOLN
5.0000 [IU] | Freq: Two times a day (BID) | SUBCUTANEOUS | Status: DC
  Administered 2019-03-08 – 2019-03-09 (×3): 5 [IU] via SUBCUTANEOUS
  Filled 2019-03-08 (×4): qty 0.05

## 2019-03-08 MED ORDER — LABETALOL HCL 5 MG/ML IV SOLN
10.0000 mg | INTRAVENOUS | Status: DC | PRN
  Administered 2019-03-08 – 2019-03-11 (×7): 10 mg via INTRAVENOUS
  Filled 2019-03-08 (×8): qty 4

## 2019-03-08 MED ORDER — FENTANYL CITRATE (PF) 100 MCG/2ML IJ SOLN
50.0000 ug | INTRAMUSCULAR | Status: DC | PRN
  Administered 2019-03-08 (×2): 50 ug via INTRAVENOUS
  Filled 2019-03-08 (×2): qty 2

## 2019-03-08 NOTE — Progress Notes (Signed)
eLink Physician-Brief Progress Note Patient Name: Paige Arnold DOB: July 11, 1941 MRN: [id number]   Date of Service  03/08/2019  HPI/Events of Note  Agitation vs Anoxic Myoclonus vs Shivering.   eICU Interventions  Will order: 1. Fentanyl 50 mcg IV Q 2 hours PRN agitation or shivering.      Intervention Category Major Interventions: Other:  Lysle Dingwall 03/08/2019, 5:43 AM

## 2019-03-08 NOTE — Progress Notes (Signed)
vLTM EEG D/C per Neuro. No skin breakdown

## 2019-03-08 NOTE — Progress Notes (Addendum)
                    NEURO HOSPITALIST PROGRESS NOTE   Subjective: Patient in bed intubated not sedated. Eyes rove upward, and body tenses up. Patient appears to "shiver" and then relaxes as her eyes return to central position. Appears myoclonic activity. LTM connected. Plan to disconnect today.   Exam: Vitals:   03/08/19 0746 03/08/19 0800  BP:  (!) 184/81  Pulse:    Resp:    Temp: (!) 97 F (36.1 C)   SpO2:      Physical Exam   HEENT-  Normocephalic, no lesions, without obvious abnormality.  Normal external eye and conjunctiva.   Cardiovascular- , pulses palpable throughout   Lungs-intubated Abdomen- All 4 quadrants palpated and nontender Extremities- edematous Musculoskeletal-no joint tenderness, deformity or swelling Skin-warm and dry, no hyperpigmentation, vitiligo, or suspicious lesions   Neuro:  Mental Status/ CN: Patient is comatose, intubated not sedated. Does not respond to verbal or noxious stimuli. PERRL present. No cough and gag with suctioning. Myoclonic activity appears as "shivers" BUE increase in tone and eyes gaze upwards. No purposeful movement.      Medications:  Scheduled: . chlorhexidine gluconate (MEDLINE KIT)  15 mL Mouth Rinse BID  . Chlorhexidine Gluconate Cloth  6 each Topical Daily  . heparin  5,000 Units Subcutaneous Q8H  . insulin aspart  1-3 Units Subcutaneous Q4H  . insulin detemir  5 Units Subcutaneous Q12H  . levothyroxine  150 mcg Per Tube QAC breakfast  . mouth rinse  15 mL Mouth Rinse 10 times per day  . pantoprazole (PROTONIX) IV  40 mg Intravenous QHS   Continuous: . sodium chloride 50 mL/hr at 03/08/19 0658  . norepinephrine (LEVOPHED) Adult infusion     PRN:fentaNYL (SUBLIMAZE) injection, hydrALAZINE  Pertinent Labs/Diagnostics:   Dg Abd 1 View  Result Date: 03/05/2019 CLINICAL DATA:  NG tube placement EXAM: ABDOMEN - 1 VIEW COMPARISON:  June 23, 2018 FINDINGS: The enteric tube terminates over the gastric body. The  tip is pointed distally. The bowel gas pattern is nonspecific and nonobstructive. There are surgical clips in the right upper quadrant, presumably from prior cholecystectomy. IMPRESSION: Enteric tube tip projects over the gastric body. Electronically Signed   By: Christopher  Green M.D.   On: 02/23/2019 19:57   Assessment:  78-year-old female with likely anoxic injury.  Following my initial examination, with concern for confusion prior to arrest, there was concern for possible ongoing seizure activity and therefore a stat EEG was obtained which demonstrates a burst suppression pattern.  The bursts are not identical/epileptic, so we  LTM final read pending, but we suspect poor prognosis and LTM will be discontinued today.    Recommendations:  -- d/c LTM -- CTH  Jessica Williams, MSN, NP-C [facility] [phone]  Attending neurologist's note to follow    03/08/2019, 8:13 AM    NEUROHOSPITALIST ADDENDUM Performed a face to face diagnostic evaluation.   I have reviewed the contents of history and physical exam as documented by PA/ARNP/Resident and agree with above documentation.  I have discussed and formulated the above plan as documented. Edits to the note have been made as needed.  78-year-old female with multiple medical coronary disease presented with PEA arrest.  ROSC at approximately 40 minutes.  Normothermia protocol initiated  (not hypothermia ). Patient had myoclonus and therefore stat EEG was obtained over night. EEG (showed burst suppression, patient connected to LTM which showed worsening burst suppression with   patient having eye-opening consistent with myoclonic status epilepticus.  On examination, patient pupils are equal and reactive, doll's eye present, corneal reflex present, gag absent.  Patient is not having any spontaneous motor activity, does not withdraw to noxious stimulus.  The patient has rhythmic eye opening.  Exam and EEG findings of myoclonic  status epilepticus are concerning for very poor prognosis.   Discontinue long-term EEG. Repeat CT head after d/c EEG/ Goals of care discussion, neurology will be available during the meeting if needed.  Family not at bedside.  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  She is at high chance of neurological worsening due brain herniation, worsening seizures and high chance of death.  Care of this patient includes monitoring during vital signs, reviewing EEG.  Discussion with the intensivist team and complex medical decision making.  I spent 35 minutes of critical care time independent of time spent by the PA.      MD [facility] [phone]   If 7pm to 7am, please call on call as listed on AMION.    Addendum Spoke to the daughter and updated her on the results.  Daughters voiced understanding regarding very poor prognosis.  Patient has been made DNR however, yet to make final decisions regarding care. Neurology will be available if needed.  I will be signing off from the service and  Dr. Lindzen will be taking over from tomorrow.  Please page if needed for family discussion.  

## 2019-03-08 NOTE — Progress Notes (Signed)
Elink notified about pt's shivering and water trend. Awaiting further orders/callback.

## 2019-03-08 NOTE — Procedures (Addendum)
Patient Name: Paige Arnold  MRN: [id number]  Epilepsy Attending: Lora Havens  Referring Physician/Provider: Dr Roland Rack Duration: 02/16/2019 2135 to 03/08/2019 0945  Patient history: 78yo F with cardiac arrest. EEG to evaluate for seizure.  Level of alertness: comatose  AEDs during EEG study: None  Technical aspects: This EEG study was done with scalp electrodes positioned according to the 10-20 International system of electrode placement. Electrical activity was acquired at a sampling rate of 500Hz  and reviewed with a high frequency filter of 70Hz  and a low frequency filter of 1Hz . EEG data were recorded continuously and digitally stored.   DESCRIPTION: EEG initially showed burst suppression pattern with generalized polymorphic 5-7Hz  theta slowing lasting 2-4 seconds and generalized suppression lasting 2-4 seconds. Gradually, the duration of burst and suppression increased to 10-15 seconds. During periods of bursts, patient had eye opening which is likely a myoclonic seizure. EEG was not reactive to tactile stimulation. Hyperventilation and photic stimulation were not performed.  IMPRESSION: This study showed frequent myoclonic seizures consistent with bursts on EEG which is likely secondary to diffuse anoxic/hypoxic brain injury. There was also profound diffuse encephalopathy.   Dmarion Perfect Barbra Sarks

## 2019-03-08 NOTE — Progress Notes (Signed)
Pt transported to CT via ventilator with no complications noted. Pt returned to [facility] RCP notified.

## 2019-03-08 NOTE — Progress Notes (Signed)
  Echocardiogram 2D Echocardiogram with definity has been performed.  Paige Arnold L Androw 03/08/2019, 12:39 PM

## 2019-03-08 NOTE — Progress Notes (Signed)
NAME:  Paige Arnold, MRN:  [id number], DOB:  1940/11/02, LOS: 1 ADMISSION DATE:  02/20/2019, CONSULTATION DATE:  8/22 REFERRING MD:  Colin Rhein MD from [facility], CHIEF COMPLAINT:  Cardiac arrest   Brief History   78 year old female patient with multiple medical comorbidities being admitted to [facility] 8/22 following PEA arrest.  Verbally reported time to return of spontaneous circulation estimated at 40 minutes  Past Medical History  Hypertension, hypothyroidism, prior CVA, diabetes, stage V chronic kidney disease/end-stage renal disease(has a left upper extremity AV fistula), chronic pain, multifactorial cognitive impairment felt secondary to depression and stroke  Significant Hospital Events   8/22 Admitted normothermia protocol initiated  Consults:  8/22 Neurology  Procedures:  8/22 endotracheal tube >>  8/22 left femoral triple-lumen catheter >>   Significant Diagnostic Tests:  CT brain 8/22 >> negative  EEG 8/22 >> burst suppression pattern  Micro Data:    Antimicrobials:     Interim history/subjective:  Remains on vent.  On LTM.  BP elevated overnight.  Objective   Blood pressure (!) 184/81, pulse 97, temperature (!) 97 F (36.1 C), resp. rate 15, height 5\' 5"  (1.651 m), weight 88.3 kg, SpO2 98 %.    Vent Mode: PRVC FiO2 (%):  [30 %-100 %] 30 % Set Rate:  [16 bmp] 16 bmp Vt Set:  [450 mL] 450 mL PEEP:  [5 cmH20] 5 cmH20 Plateau Pressure:  [13 cmH20-16 cmH20] 16 cmH20   Intake/Output Summary (Last 24 hours) at 03/08/2019 0919 Last data filed at 03/08/2019 0900 Gross per 24 hour  Intake 797.55 ml  Output 680 ml  Net 117.55 ml   Filed Weights   03/10/2019 2000 03/08/19 0500  Weight: 87.3 kg 88.3 kg    Examination:  General - on vent Eyes - pupils mid point ENT - ETT in place Cardiac - regular, tachycardic, no murmur Chest - equal breath sounds b/l, no wheezing or rales Abdomen - soft, non tender, + bowel sounds Extremities - AV graft Lt upper arm w/o  palpable thrill Skin - no rashes Neuro - opens eyes spontaneously but w/o purpose, not following commands   Resolved Hospital Problem list   PEA cardiac arrest, Anion gap metabolic acidosis  Assessment & Plan:   Acute hypoxic respiratory failure in setting of cardiac arrest. - full vent support  Acute anoxic encephalopathy. Hx of chronic pain. - TTM 36 degrees - f/u CT head - neurology following - hold outpt aricept, cymbalta, neurontin, norco  Hypertension, HLD. - goal SBP < 160 - hold outpt ASA, catapres, lasix, imdur, crestor  Stage 5 CKD. - AV graft in Lt arm likely non functional after cardiac arrest - if CT head on 8/23 is unrevealing, would then likely need to consult nephrology to determine if short term dialysis will improve mental status  DM type II. - SSI with levemir - hold outpt trulicity, amaryl  Hx of hypothyroidism. - continue synthroid  Best practice:  Diet: NPO DVT prophylaxis: SQ heparin GI prophylaxis: protonix Mobility: bed rest Code Status: full code Disposition: ICU  Labs    CMP Latest Ref Rng & Units 03/08/2019 03/08/2019 03/10/2019  Glucose 70 - 99 mg/dL - 143(H) 367(H)  BUN 8 - 23 mg/dL - 65(H) 65(H)  Creatinine 0.44 - 1.00 mg/dL - 7.19(H) 7.20(H)  Sodium 135 - 145 mmol/L 146(H) 145 143  Potassium 3.5 - 5.1 mmol/L 3.7 3.8 3.4(L)  Chloride 98 - 111 mmol/L - 112(H) 107  CO2 22 - 32 mmol/L - 19(L)  16(L)  Calcium 8.9 - 10.3 mg/dL - 8.9 9.0  Total Protein 6.5 - 8.1 g/dL - - -  Total Bilirubin 0.3 - 1.2 mg/dL - - -  Alkaline Phos 38 - 126 U/L - - -  AST 15 - 41 U/L - - -  ALT 0 - 44 U/L - - -   CBC Latest Ref Rng & Units 03/08/2019 03/08/2019 03/13/2019  WBC 4.0 - 10.5 K/uL - 25.8(H) 26.4(H)  Hemoglobin 12.0 - 15.0 g/dL 13.9 13.9 13.1  Hematocrit 36.0 - 46.0 % 41.0 43.4 41.3  Platelets 150 - 400 K/uL - 246 247   ABG    Component Value Date/Time   PHART 7.267 (L) 03/08/2019 0416   PCO2ART 39.4 03/08/2019 0416   PO2ART 159.0 (H)  03/08/2019 0416   HCO3 18.0 (L) 03/08/2019 0416   TCO2 19 (L) 03/08/2019 0416   ACIDBASEDEF 9.0 (H) 03/08/2019 0416   O2SAT 99.0 03/08/2019 0416   CBG (last 3)  Recent Labs    02/14/2019 1958 03/04/2019 2345 03/08/19 0339  GLUCAP 263* 180* 124*    CC time 33 minutes  Chesley Mires, MD [facility] Pulmonary/Critical Care 03/08/2019, 9:28 AM

## 2019-03-08 NOTE — Progress Notes (Signed)
Spoke with pt's daughter by phone.  Discussed EEG and CT head findings.  Explained that her mom will not regain neurologic function and best case scenario is that she will remain in vegetative state, but there is possibility she could progress to brain death if cerebral edema gets worse.  Also explained that she would not be a candidate for renal replacement given her current neurologic findings.  She understands the gravity of the situation and wants to communicate with her family further before deciding on next steps.    She does agree that cardiac resuscitation should not be done again if Ms. Marotz develops cardiac arrest again.  DNR order placed.  Time spent with conversation 12 minutes.  Chesley Mires, MD [facility] Pulmonary/Critical Care 03/08/2019, 2:05 PM

## 2019-03-09 ENCOUNTER — Inpatient Hospital Stay (HOSPITAL_COMMUNITY): Payer: Medicare HMO

## 2019-03-09 DIAGNOSIS — N179 Acute kidney failure, unspecified: Secondary | ICD-10-CM

## 2019-03-09 LAB — CBC
HCT: 40 % (ref 36.0–46.0)
Hemoglobin: 12.7 g/dL (ref 12.0–15.0)
MCH: 31.3 pg (ref 26.0–34.0)
MCHC: 31.8 g/dL (ref 30.0–36.0)
MCV: 98.5 fL (ref 80.0–100.0)
Platelets: 185 10*3/uL (ref 150–400)
RBC: 4.06 MIL/uL (ref 3.87–5.11)
RDW: 13.6 % (ref 11.5–15.5)
WBC: 22 10*3/uL — ABNORMAL HIGH (ref 4.0–10.5)
nRBC: 0 % (ref 0.0–0.2)

## 2019-03-09 LAB — RENAL FUNCTION PANEL
Albumin: 2.4 g/dL — ABNORMAL LOW (ref 3.5–5.0)
Anion gap: 16 — ABNORMAL HIGH (ref 5–15)
BUN: 78 mg/dL — ABNORMAL HIGH (ref 8–23)
CO2: 20 mmol/L — ABNORMAL LOW (ref 22–32)
Calcium: 8.8 mg/dL — ABNORMAL LOW (ref 8.9–10.3)
Chloride: 109 mmol/L (ref 98–111)
Creatinine, Ser: 8.45 mg/dL — ABNORMAL HIGH (ref 0.44–1.00)
GFR calc Af Amer: 5 mL/min — ABNORMAL LOW (ref >60)
GFR calc non Af Amer: 4 mL/min — ABNORMAL LOW (ref >60)
Glucose, Bld: 112 mg/dL — ABNORMAL HIGH (ref 70–99)
Phosphorus: 7 mg/dL — ABNORMAL HIGH (ref 2.5–4.6)
Potassium: 5.6 mmol/L — ABNORMAL HIGH (ref 3.5–5.1)
Sodium: 145 mmol/L (ref 135–145)

## 2019-03-09 LAB — GLUCOSE, CAPILLARY
Glucose-Capillary: 120 mg/dL — ABNORMAL HIGH (ref 70–99)
Glucose-Capillary: 129 mg/dL — ABNORMAL HIGH (ref 70–99)
Glucose-Capillary: 130 mg/dL — ABNORMAL HIGH (ref 70–99)
Glucose-Capillary: 84 mg/dL (ref 70–99)
Glucose-Capillary: 85 mg/dL (ref 70–99)
Glucose-Capillary: 95 mg/dL (ref 70–99)
Glucose-Capillary: 97 mg/dL (ref 70–99)

## 2019-03-09 MED ORDER — INSULIN ASPART 100 UNIT/ML ~~LOC~~ SOLN
0.0000 [IU] | SUBCUTANEOUS | Status: DC
  Administered 2019-03-10 (×3): 2 [IU] via SUBCUTANEOUS
  Administered 2019-03-10: 3 [IU] via SUBCUTANEOUS
  Administered 2019-03-10 – 2019-03-11 (×4): 2 [IU] via SUBCUTANEOUS

## 2019-03-09 MED ORDER — ATROPINE SULFATE 1 MG/10ML IJ SOSY
PREFILLED_SYRINGE | INTRAMUSCULAR | Status: AC
  Filled 2019-03-09: qty 10

## 2019-03-09 MED ORDER — LORAZEPAM 2 MG/ML IJ SOLN
1.0000 mg | INTRAMUSCULAR | Status: DC | PRN
  Administered 2019-03-10 – 2019-03-11 (×4): 2 mg via INTRAVENOUS
  Filled 2019-03-09 (×4): qty 1

## 2019-03-09 MED ORDER — HYDRALAZINE HCL 20 MG/ML IJ SOLN
10.0000 mg | INTRAMUSCULAR | Status: DC | PRN
  Administered 2019-03-09 – 2019-03-11 (×3): 10 mg via INTRAVENOUS
  Filled 2019-03-09 (×2): qty 1

## 2019-03-09 MED ORDER — HYDRALAZINE HCL 20 MG/ML IJ SOLN
INTRAMUSCULAR | Status: AC
  Administered 2019-03-09: 10 mg via INTRAVENOUS
  Filled 2019-03-09: qty 1

## 2019-03-09 NOTE — Progress Notes (Signed)
[place] Progress Note Patient Name: Paige Arnold DOB: 1941-01-12 MRN: [id number]   Date of Service  03/09/2019  HPI/Events of Note  Hypertension - BP 184/45 with MAP = 108.   eICU Interventions  Will order: 1. Hold Labetalol dose for HR < 60. 2. Hydralazine 10 mg IV Q 4 hours PRN SBP > 160 or DBP > 100.     Intervention Category Major Interventions: Hypertension - evaluation and management  Paige Arnold 03/09/2019, 8:55 PM

## 2019-03-09 NOTE — Progress Notes (Addendum)
NAME:  Paige Arnold, MRN:  [id number], DOB:  08-03-1940, LOS: 2 ADMISSION DATE:  03/14/2019, CONSULTATION DATE:  8/22 REFERRING MD:  Colin Rhein MD from [place] [facility], CHIEF COMPLAINT:  Cardiac arrest   Brief History   78 year old female patient with multiple medical comorbidities  admitted to [facility] 8/22 following PEA arrest.  Verbally reported time to return of spontaneous circulation estimated at 40 minutes  Past Medical History  Hypertension, hypothyroidism, prior CVA, diabetes, stage V chronic kidney disease/end-stage renal disease(has a left upper extremity AV fistula), chronic pain, multifactorial cognitive impairment felt secondary to depression and stroke  Significant Hospital Events   8/22 Admitted normothermia protocol initiated  Consults:  8/22 Neurology  Procedures:  8/22 endotracheal tube >>  8/22 left femoral triple-lumen catheter >>   Significant Diagnostic Tests:  CT brain 8/22 >> negative  EEG 8/22 >> burst suppression pattern LTM EEG 8/23 frequent myoclonic seizures consistent with bursts   Head CT 8/23 >> Diffuse cortical and subcortical ischemia involving the border zone is bilaterally with extensive involvement of the posterior temporal and occipital lobes. 2. Extensive ischemic changes in the inferior cerebellum. 3. Ischemic changes of the basal ganglia. 4. No acute hemorrhage. 5. Diffuse edema with effacement of the sulci and ventricles.  Micro Data:    Antimicrobials:     Interim history/subjective:   Critically ill, intubated Afebrile Oliguric   Objective   Blood pressure (!) 149/60, pulse (!) 59, temperature 97.7 F (36.5 C), temperature source Core, resp. rate 18, height 5\' 5"  (1.651 m), weight 88.3 kg, SpO2 98 %.    Vent Mode: PRVC FiO2 (%):  [30 %] 30 % Set Rate:  [18 bmp] 18 bmp Vt Set:  [450 mL] 450 mL PEEP:  [5 cmH20] 5 cmH20 Plateau Pressure:  [10 cmH20-17 cmH20] 17 cmH20   Intake/Output Summary (Last 24 hours) at 03/09/2019 [id number]  Last data filed at 03/09/2019 0900 Gross per 24 hour  Intake 277.05 ml  Output 1160 ml  Net -882.95 ml   Filed Weights   03/04/2019 2000 03/08/19 0500  Weight: 87.3 kg 88.3 kg    Examination:  General -acutely ill, intubated Eyes -mild pallor, no icterus ENT - oral ETT in place Cardiac - regular, tachycardic, no murmur Chest - equal breath sounds b/l, no wheezing or rales Abdomen - soft, non tender, + bowel sounds Extremities - AV graft Lt upper arm w/o palpable thrill Skin - no rashes Neuro -eyes half open, upward gaze, roving eye movements, does not follow commands, no posturing to deep pain stimulus, conjunctival reflex absent, corneals present, spontaneous respirations   Chest x-ray 8/24 personally reviewed shows ET tube in position and bibasal atelectasis, small effusions  Resolved Hospital Problem list   PEA cardiac arrest, Anion gap metabolic acidosis  Assessment & Plan:   Acute hypoxic respiratory failure in setting of cardiac arrest. - full vent support  Acute anoxic encephalopathy-exam and head CT consistent with extensive anoxic damage, myoclonic seizures Hx of chronic pain. - TTM 36 degrees - neurology following - hold outpt aricept, cymbalta, neurontin, norco -Ativan as needed seizures, clonus seems to have subsided  Hypertension, HLD. - goal SBP < 160 - hold outpt ASA, catapres, lasix, imdur, crestor  AKI Stage 5 CKD. - AV graft in Lt arm  -Not a candidate for dialysis  DM type II. - SSI , dc levemir - hold outpt trulicity, amaryl  Hx of hypothyroidism. - continue synthroid  Goals of care-DNR issued 8/23.  I discussed with  daughter 8/24 and conveyed poor prognosis for meaningful neurologic recovery she understood this but feels that her father may need some more time to come to terms with this especially since he saw pts eyes half open.  Withdrawal of life support tentatively scheduled for Wednesday pending further family discussion.  Will allow  full visitation With no escalation of care meantime, no dialysis  Best practice:  Diet: NPO DVT prophylaxis: SQ heparin GI prophylaxis: protonix Mobility: bed rest Code Status: DNR Disposition: ICU  The patient is critically ill with multiple organ systems failure and requires high complexity decision making for assessment and support, frequent evaluation and titration of therapies, application of advanced monitoring technologies and extensive interpretation of multiple databases. Critical Care Time devoted to patient care services described in this note independent of APP/resident  time is 31 minutes.    03/09/2019, 9:26 AM

## 2019-03-09 NOTE — Progress Notes (Signed)
RT note: patient does not meet SBT criteria this AM.  Per RN, patient possibly transitioning to comfort care.  Tolerating current ventilator settings well.  Will continue to monitor.

## 2019-03-09 NOTE — Progress Notes (Addendum)
Elink notified r/t Pt Covid test not seen within Epic chart results. Pt currently rewarmed &  spiking fevers, Pt did not receive Covid test upon admission. MD notified, verbal orders given to test for Covid.   @ 2100 MD notified r/t high SBPs 170-180. Pt currently has labetalol prn. Current VS  BP -184/75, HR- 60. See new orders.   -Covid swab test performed at 2130. Hand delivered to lab by RN

## 2019-03-09 NOTE — Progress Notes (Signed)
eLink Physician-Brief Progress Note Patient Name: Paige Arnold DOB: March 07, 1941 MRN: [id number]   Date of Service  03/09/2019  HPI/Events of Note  Patient with fever - now on TTM with bath temp = 28 F to maintain temp of 37 F. Central fever vs other (COVID).  eICU Interventions  Will order: 1. "Optima" COVID test now. 2. COVID isolation until "Optima" COVID test returns negative.      Intervention Category Major Interventions: Infection - evaluation and management  Sommer,Steven Eugene 03/09/2019, 8:22 PM

## 2019-03-09 NOTE — Progress Notes (Signed)
Initial Nutrition Assessment  DOCUMENTATION CODES:   Obesity unspecified  INTERVENTION:   - RD will monitor for continued discussions regarding GOC  If tube feedings are pursued, recommend: - Vital High Protein @ 45 ml/hr (1080 ml/day) - Pro-stat 30 ml BID  Tube feeding regimen provides 1280 kcal, 125 grams of protein, and 903 ml of H2O.  NUTRITION DIAGNOSIS:   Inadequate oral intake related to acute illness as evidenced by NPO status.  GOAL:   Provide needs based on ASPEN/SCCM guidelines  MONITOR:   Vent status, Labs, Weight trends, I & O's, Other (GOC)  REASON FOR ASSESSMENT:   Ventilator    ASSESSMENT:   78 year old female who presented on 8/22 after a cardiac arrest with PEA/asystole. PMH of HTN, hypothyroidism, prior CVA, DM, stage V CKD/ESRD, chronic pain. Normothermia protocol initiated.   Pt with anoxic brain injury.  Per CCM note, "withdrawal of life support tentatively scheduled for Wednesday pending further family discussion...with no escalation of care meantime, no dialysis." RD will leave tube feeding recommendations for use if needed.  Discussed pt with RN and during ICU rounds.  NG tube in place in right nare, currently to low intermittent suction.  Weights variable over the last year between 78-94 kg. Unsure of pt's dry weight.  Patient is currently intubated on ventilator support MV: 8 L/min Temp (24hrs), Avg:97.6 F (36.4 C), Min:95.5 F (35.3 C), Max:98.6 F (37 C)  Medications reviewed and include: SSI q 4 hours, Protonix IVF: NS @ 10 ml/hr  Labs reviewed: potassium 5.6, phosphorus 7.0 CBG's: 84-153 x 24 hours  UOP: 535 ml x 24 hours NGT: 690 ml x 24 hours  NUTRITION - FOCUSED PHYSICAL EXAM:  Deferred.  Diet Order:   Diet Order    None      EDUCATION NEEDS:   Not appropriate for education at this time  Skin:  Skin Assessment: Reviewed RN Assessment  Last BM:  03/10/2019  Height:   Ht Readings from Last 1 Encounters:   03/14/2019 5\' 5"  (1.651 m)    Weight:   Wt Readings from Last 1 Encounters:  03/08/19 88.3 kg    Ideal Body Weight:  56.8 kg  BMI:  Body mass index is 32.39 kg/m.  Estimated Nutritional Needs:   Kcal:  [id number]  Protein:  115-130 grams  Fluid:  >/= 1.2 L    Gaynell Face, MS, RD, LDN Inpatient Clinical Dietitian Pager: [phone] Weekend/After Hours: [phone]

## 2019-03-10 ENCOUNTER — Other Ambulatory Visit: Payer: Self-pay

## 2019-03-10 ENCOUNTER — Encounter (HOSPITAL_COMMUNITY): Payer: Self-pay | Admitting: *Deleted

## 2019-03-10 LAB — GLUCOSE, CAPILLARY
Glucose-Capillary: 140 mg/dL — ABNORMAL HIGH (ref 70–99)
Glucose-Capillary: 153 mg/dL — ABNORMAL HIGH (ref 70–99)
Glucose-Capillary: 139 mg/dL — ABNORMAL HIGH (ref 70–99)
Glucose-Capillary: 128 mg/dL — ABNORMAL HIGH (ref 70–99)
Glucose-Capillary: 146 mg/dL — ABNORMAL HIGH (ref 70–99)

## 2019-03-10 LAB — SARS CORONAVIRUS 2 (TAT 6-24 HRS): SARS Coronavirus 2: NEGATIVE

## 2019-03-10 NOTE — Progress Notes (Signed)
NAME:  Paige Arnold, MRN:  [id number], DOB:  Oct 15, 1940, LOS: 3 ADMISSION DATE:  02/22/2019, CONSULTATION DATE:  8/22 REFERRING MD:  Colin Rhein MD from [place] [facility], CHIEF COMPLAINT:  Cardiac arrest   Brief History   78 year old female patient with multiple medical comorbidities  admitted to [facility] 8/22 following PEA arrest.  Verbally reported time to return of spontaneous circulation estimated at 40 minutes  Past Medical History  Hypertension, hypothyroidism, prior CVA, diabetes, stage V chronic kidney disease/end-stage renal disease(has a left upper extremity AV fistula), chronic pain, multifactorial cognitive impairment felt secondary to depression and stroke  Significant Hospital Events   8/22 Admitted normothermia protocol initiated  Consults:  8/22 Neurology  Procedures:  8/22 endotracheal tube >>  8/22 left femoral triple-lumen catheter >>   Significant Diagnostic Tests:  CT brain 8/22 >> negative  EEG 8/22 >> burst suppression pattern LTM EEG 8/23 frequent myoclonic seizures consistent with bursts   Head CT 8/23 >> Diffuse cortical and subcortical ischemia involving the border zone is bilaterally with extensive involvement of the posterior temporal and occipital lobes. 2. Extensive ischemic changes in the inferior cerebellum. 3. Ischemic changes of the basal ganglia. 4. No acute hemorrhage. 5. Diffuse edema with effacement of the sulci and ventricles.  Micro Data:    Antimicrobials:     Interim history/subjective:   No change overnight.  Normothermia.  Intermittent HTN.  Unresponsive, off all sedation  Tentative plans for withdrawal of care 8/26   Objective   Blood pressure (!) 174/72, pulse 90, temperature 97.8 F (36.6 C), temperature source Axillary, resp. rate 18, height 5\' 5"  (1.651 m), weight 88.3 kg, SpO2 97 %.    Vent Mode: PRVC FiO2 (%):  [30 %] 30 % Set Rate:  [18 bmp] 18 bmp Vt Set:  [450 mL] 450 mL PEEP:  [5 cmH20] 5 cmH20 Plateau Pressure:   [14 cmH20-17 cmH20] 15 cmH20   Intake/Output Summary (Last 24 hours) at 03/10/2019 0933 Last data filed at 03/10/2019 0900 Gross per 24 hour  Intake 239.65 ml  Output 1045 ml  Net -805.35 ml   Filed Weights   03/09/2019 2000 03/08/19 0500  Weight: 87.3 kg 88.3 kg    Examination: General:  Acutely ill, NAD  HEENT: MM pink/moist, ETT Neuro: unresponsive, pupils 71mm sluggish, upward gaze  CV: s1s2 rrr, no m/r/g PULM:  resps even non labored on vent, does not breathe over set rate  GI: soft, bsx4 active  Extremities: warm/dry, scant BLE edema  Skin: no rashes or lesions   Resolved Hospital Problem list   PEA cardiac arrest, Anion gap metabolic acidosis  Assessment & Plan:   Acute hypoxic respiratory failure in setting of cardiac arrest. - full vent support for now - plans for withdrawal of care 8/26 evening   Acute anoxic encephalopathy-exam and head CT consistent with extensive anoxic damage, myoclonic seizures Hx of chronic pain.  - neurology following - hold outpt aricept, cymbalta, neurontin, norco -Ativan as needed seizures, clonus seems to have subsided  Hypertension, HLD. - goal SBP < 160 - hold outpt ASA, catapres, lasix, imdur, crestor  AKI Stage 5 CKD. - AV graft in Lt arm  -Not a candidate for dialysis  DM type II. - SSI , dc levemir - hold outpt trulicity, amaryl  Hx of hypothyroidism. - continue synthroid  Goals of care discussion from 8/23 reviewed. Family spoke with RN today - planning to arrive tomorrow ~530 and wish to withdraw care at that time. No escalation  in meantime. No further labs, xrays, etc.   Best practice:  Diet: NPO DVT prophylaxis: SQ heparin GI prophylaxis: protonix Mobility: bed rest Code Status: DNR Disposition: ICU  The patient is critically ill with multiple organ systems failure and requires high complexity decision making for assessment and support, frequent evaluation and titration of therapies, application of advanced  monitoring technologies and extensive interpretation of multiple databases. Critical Care Time devoted to patient care services described in this note independent of APP/resident  time is 35 minutes.   Nickolas Madrid, NP 03/10/2019  9:33 AM Pager: [phone] or [phone]

## 2019-03-10 NOTE — Progress Notes (Addendum)
Pt daughter called, Kelli Churn, stating that family made the decision to withdraw of care 8/26 at 1330.

## 2019-03-11 LAB — GLUCOSE, CAPILLARY
Glucose-Capillary: 124 mg/dL — ABNORMAL HIGH (ref 70–99)
Glucose-Capillary: 140 mg/dL — ABNORMAL HIGH (ref 70–99)

## 2019-03-11 MED ORDER — ACETAMINOPHEN 325 MG PO TABS: 650.0000 mg | ORAL_TABLET | Freq: Four times a day (QID) | ORAL | Status: DC | PRN

## 2019-03-11 MED ORDER — GLYCOPYRROLATE 1 MG PO TABS
1.0000 mg | ORAL_TABLET | ORAL | Status: DC | PRN
  Filled 2019-03-11: qty 1

## 2019-03-11 MED ORDER — GLYCOPYRROLATE 0.2 MG/ML IJ SOLN
0.2000 mg | INTRAMUSCULAR | Status: DC | PRN
  Administered 2019-03-11: 0.2 mg via INTRAVENOUS
  Filled 2019-03-11: qty 1

## 2019-03-11 MED ORDER — DEXTROSE 5 % IV SOLN: INTRAVENOUS | Status: DC

## 2019-03-11 MED ORDER — POLYVINYL ALCOHOL 1.4 % OP SOLN
1.0000 [drp] | Freq: Four times a day (QID) | OPHTHALMIC | Status: DC | PRN
  Filled 2019-03-11: qty 15

## 2019-03-11 MED ORDER — MORPHINE 100MG IN NS 100ML (1MG/ML) PREMIX INFUSION
0.0000 mg/h | INTRAVENOUS | Status: DC
  Administered 2019-03-11: 5 mg/h via INTRAVENOUS
  Filled 2019-03-11: qty 100

## 2019-03-11 MED ORDER — GLYCOPYRROLATE 0.2 MG/ML IJ SOLN: 0.2000 mg | INTRAMUSCULAR | Status: DC | PRN

## 2019-03-11 MED ORDER — MORPHINE BOLUS VIA INFUSION
5.0000 mg | INTRAVENOUS | Status: DC | PRN
  Administered 2019-03-11 (×6): 5 mg via INTRAVENOUS
  Filled 2019-03-11: qty 5

## 2019-03-11 MED ORDER — DIPHENHYDRAMINE HCL 50 MG/ML IJ SOLN: 25.0000 mg | INTRAMUSCULAR | Status: DC | PRN

## 2019-03-11 MED ORDER — ACETAMINOPHEN 650 MG RE SUPP: 650.0000 mg | Freq: Four times a day (QID) | RECTAL | Status: DC | PRN

## 2019-03-11 MED ORDER — MORPHINE SULFATE (PF) 2 MG/ML IV SOLN: 2.0000 mg | INTRAVENOUS | Status: DC | PRN

## 2019-03-12 LAB — GLUCOSE, CAPILLARY: Glucose-Capillary: 131 mg/dL — ABNORMAL HIGH (ref 70–99)

## 2019-03-13 ENCOUNTER — Encounter: Payer: No Typology Code available for payment source | Admitting: Registered Nurse

## 2019-03-17 ENCOUNTER — Telehealth: Payer: Self-pay

## 2019-03-17 NOTE — Progress Notes (Signed)
Spoke with pt's daughter. Plan is still to withdraw at 1330 this afternoon.  RT made aware. Pt comfortable. Will continue to monitor.

## 2019-03-17 NOTE — Progress Notes (Signed)
Wasted 51ml morphine with Scheryl Darter RN.

## 2019-03-17 NOTE — Progress Notes (Signed)
NAME:  Paige Arnold, MRN:  [id number], DOB:  [birth year], LOS: 4 ADMISSION DATE:  02/22/2019, CONSULTATION DATE:  8/22 REFERRING MD:  Colin Rhein MD from [place] ER, CHIEF COMPLAINT:  Cardiac arrest   Brief History   78 year old female patient with multiple medical comorbidities  admitted to [facility] 8/22 following PEA arrest.  Verbally reported time to return of spontaneous circulation estimated at 40 minutes  Past Medical History  Hypertension, hypothyroidism, prior CVA, diabetes, stage V chronic kidney disease/end-stage renal disease(has a left upper extremity AV fistula), chronic pain, multifactorial cognitive impairment felt secondary to depression and stroke  Significant Hospital Events   8/22 Admitted normothermia protocol initiated  Consults:  8/22 Neurology  Procedures:  8/22 endotracheal tube >>  8/22 left femoral triple-lumen catheter >>   Significant Diagnostic Tests:  CT brain 8/22 >> negative  EEG 8/22 >> burst suppression pattern LTM EEG 8/23 frequent myoclonic seizures consistent with bursts   Head CT 8/23 >> Diffuse cortical and subcortical ischemia involving the border zone is bilaterally with extensive involvement of the posterior temporal and occipital lobes. 2. Extensive ischemic changes in the inferior cerebellum. 3. Ischemic changes of the basal ganglia. 4. No acute hemorrhage. 5. Diffuse edema with effacement of the sulci and ventricles.  Micro Data:    Antimicrobials:     Interim history/subjective:   No change  Discussed with RN  Family planning to arrive today ~1330 for withdrawal of care   Objective   Blood pressure (!) 167/85, pulse 82, temperature 98.5 F (36.9 C), temperature source Oral, resp. rate 20, height 5\' 5"  (1.651 m), weight 88.3 kg, SpO2 97 %.    Vent Mode: PRVC FiO2 (%):  [30 %] 30 % Set Rate:  [18 bmp] 18 bmp Vt Set:  [450 mL] 450 mL PEEP:  [5 cmH20] 5 cmH20 Plateau Pressure:  [15 cmH20-18 cmH20] 15 cmH20   Intake/Output  Summary (Last 24 hours) at 04/10/19 0933 Last data filed at Apr 10, 2019 0800 Gross per 24 hour  Intake 229.99 ml  Output 586 ml  Net -356.01 ml   Filed Weights   02/20/2019 2000 03/08/19 0500  Weight: 87.3 kg 88.3 kg    Examination: General:  Acutely ill, NAD  HEENT: MM pink/moist, ETT Neuro: unresponsive, pupils 5mm sluggish, upward disconjugate gaze  CV: s1s2 rrr, no m/r/g PULM:  resps even non labored on vent, will briefly breathe over set rate but only sustains for a few seconds  GI: soft, bsx4 active  Extremities: warm/dry, scant BLE edema  Skin: no rashes or lesions   Resolved Hospital Problem list   PEA cardiac arrest, Anion gap metabolic acidosis  Assessment & Plan:   Acute hypoxic respiratory failure in setting of cardiac arrest. - full vent support for now - plans for withdrawal of care this afternoon   Acute anoxic encephalopathy-exam and head CT consistent with extensive anoxic damage, myoclonic seizures Hx of chronic pain.  - neurology following - hold outpt aricept, cymbalta, neurontin, norco -Ativan as needed seizures, clonus seems to have subsided  Hypertension, HLD. - goal SBP < 160 - hold outpt ASA, catapres, lasix, imdur, crestor  AKI Stage 5 CKD. - AV graft in Lt arm  -Not a candidate for dialysis  DM type II. - SSI , dc levemir - hold outpt trulicity, amaryl  Hx of hypothyroidism. - continue synthroid  Goals of care discussion from 8/23 reviewed. No family at bedside yet this am.  Anticipate 1330 arrival for withdrawal of care.  Best practice:  Diet: NPO DVT prophylaxis: SQ heparin GI prophylaxis: protonix Mobility: bed rest Code Status: DNR Disposition: ICU  The patient is critically ill with multiple organ systems failure and requires high complexity decision making for assessment and support, frequent evaluation and titration of therapies, application of advanced monitoring technologies and extensive interpretation of multiple  databases. Critical Care Time devoted to patient care services described in this note independent of APP/resident  time is 35 minutes.   Nickolas Madrid, NP 14-Mar-2019  9:33 AM Pager: [phone] or [phone]

## 2019-03-17 NOTE — Telephone Encounter (Signed)
Received dc from [facility].   DC is for burial and a patient of Doctor Agarwala.   DC will be taken to [place] for signature.  On 03/18/2019 Received signed dc back from Doctor Agarwala.  I called the funeral home to let them know the dc was mailed to vital records per their request.

## 2019-03-17 NOTE — Progress Notes (Signed)
Offered pastoral presence and prayer to Mrs. Kopplin's family, which included husband, two daughters, and one son, before and after Mrs. Briles was extubated.

## 2019-03-17 NOTE — Procedures (Signed)
Extubation Procedure Note  Patient Details:   Name: Paige Arnold DOB: [birth year] MRN: [id number]   Airway Documentation:    Vent end date: 2019-03-19 Vent end time: 1430   Evaluation  O2 sats: pt extubated per withdrawal of life support, & family wishes. Complications: No apparent complications Patient did tolerate procedure well. Bilateral Breath Sounds: Diminished, Clear    Pt extubated per withdrawal of life support.  Family at bedside, all questions answered prior to extubating. RN in room t/o.    Lenna Sciara March 19, 2019, 2:36 PM

## 2019-03-17 DEATH — deceased

## 2019-03-25 ENCOUNTER — Telehealth: Payer: Self-pay

## 2019-03-25 NOTE — Telephone Encounter (Signed)
Patient daughter called stating Zella Ball wrote a prescription for a lift chair for patient and attorney is needing a copy of the prescription and medical records from 2019-2020.

## 2019-03-27 ENCOUNTER — Telehealth: Payer: Self-pay

## 2019-03-27 NOTE — Telephone Encounter (Signed)
Zella Ball can you get this order to April so we cn give to daughter - sounds like [place] paid for out of pocket before death.  Family is trying to be reimbursed by wrk comp  As she was work comp patient.

## 2019-04-13 ENCOUNTER — Telehealth: Payer: Self-pay

## 2019-04-13 NOTE — Telephone Encounter (Signed)
Received dc from [facility].  Dc is a 2nd copy and the first one got lost in the mail.  The dc will be taken to [facility] 2[address] for signature.

## 2019-04-16 NOTE — Death Summary Note (Signed)
DEATH SUMMARY   Patient Details  Name: Paige Arnold MRN: [id number] DOB: 1941-03-04  Admission/Discharge Information   Admit Date:  2019/03/10  Date of Death: Date of Death: 03-14-2019  Time of Death: Time of Death: 11-Oct-1608  Length of Stay: 4  Referring Physician: Angelina Sheriff, MD   Reason(s) for Hospitalization  Cardiac arrest  Diagnoses  Preliminary cause of death: PEA cardiac arrest.  Secondary Diagnoses (including complications and co-morbidities):  Active Problems:   Cardiac arrest ([place])   Pressure injury of skin   Brief Hospital Course (including significant findings, care, treatment, and services provided and events leading to death)  Paige Arnold is a 78 y.o. year old female who suffered from Hypertension, hypothyroidism, prior CVA, diabetes, stage V chronic kidney disease/end-stage renal disease(has a left upper extremity AV fistula), chronic pain, multifactorial cognitive impairment felt secondary to depression and stroke. She presented with a PEA arrest with 40 minute downtime. She made no significant neurological improvement and the family decided to withdraw care.  Pertinent Labs and Studies  Significant Diagnostic Studies Dg Abd 1 View  Result Date: 2019-03-10 CLINICAL DATA:  NG tube placement EXAM: ABDOMEN - 1 VIEW COMPARISON:  June 23, 2018 FINDINGS: The enteric tube terminates over the gastric body. The tip is pointed distally. The bowel gas pattern is nonspecific and nonobstructive. There are surgical clips in the right upper quadrant, presumably from prior cholecystectomy. IMPRESSION: Enteric tube tip projects over the gastric body. Electronically Signed   By: Constance Holster M.D.   On: March 10, 2019 19:57   Ct Head Wo Contrast  Result Date: 03/08/2019 CLINICAL DATA:  S/p CPR.  Evaluate for anoxic brain injury. EXAM: CT HEAD WITHOUT CONTRAST TECHNIQUE: Contiguous axial images were obtained from the base of the skull through the vertex without intravenous  contrast. COMPARISON:  None. FINDINGS: Brain: Diffuse border zone ischemia is present bilaterally. Extensive cortical and white matter hypoattenuation is present bilaterally. Additional diffuse white matter hypoattenuation involves the inferior cerebellum bilaterally. There is hypoattenuation involving the caudate head bilaterally. Lentiform nucleus is poorly seen. There is diffuse effacement of the sulci. Lateral and fourth ventricles are small. Foramen magnum is patent. Vascular: No hyperdense vessel or unexpected calcification. Skull: Calvarium is intact. No focal lytic or blastic lesions are present. Sinuses/Orbits: Right-sided NG tube is in place. The paranasal sinuses and mastoid air cells are clear. The globes and orbits are within normal limits. IMPRESSION: 1. Diffuse cortical and subcortical ischemia involving the border zone is bilaterally with extensive involvement of the posterior temporal and occipital lobes. 2. Extensive ischemic changes in the inferior cerebellum. 3. Ischemic changes of the basal ganglia. 4. No acute hemorrhage. 5. Diffuse edema with effacement of the sulci and ventricles. The above was relayed via text pager to Dr. Lorraine Lax on 03/08/2019 at 12:40 . Electronically Signed   By: San Morelle M.D.   On: 03/08/2019 12:40   Dg Chest Port 1 View  Result Date: 03/09/2019 CLINICAL DATA:  Respiratory failure. EXAM: PORTABLE CHEST 1 VIEW COMPARISON:  Radiograph of 03-10-19. FINDINGS: Stable cardiomediastinal silhouette. Endotracheal and nasogastric tubes are unchanged in position. No pneumothorax is noted. Increased bibasilar edema or atelectasis is noted with small pleural effusions. Bony thorax is unremarkable. IMPRESSION: Stable support apparatus. Increased bibasilar edema or atelectasis with small pleural effusions. Electronically Signed   By: Marijo Conception M.D.   On: 03/09/2019 07:15    Microbiology Recent Results (from the past 240 hour(s))  MRSA PCR Screening  Status: None   Collection Time: 02/25/2019  3:38 PM   Specimen: Nasal Mucosa; Nasopharyngeal  Result Value Ref Range Status   MRSA by PCR NEGATIVE NEGATIVE Final    Comment:        The GeneXpert MRSA Assay (FDA approved for NASAL specimens only), is one component of a comprehensive MRSA colonization surveillance program. It is not intended to diagnose MRSA infection nor to guide or monitor treatment for MRSA infections. Performed at [facility], [place] [address], [place], [place] [address]   SARS CORONAVIRUS 2 (TAT 6-12 HRS)     Status: None   Collection Time: 03/09/19  9:30 PM  Result Value Ref Range Status   SARS Coronavirus 2 NEGATIVE NEGATIVE Final    Comment: (NOTE) SARS-CoV-2 target nucleic acids are NOT DETECTED. The SARS-CoV-2 RNA is generally detectable in upper and lower respiratory specimens during the acute phase of infection. Negative results do not preclude SARS-CoV-2 infection, do not rule out co-infections with other pathogens, and should not be used as the sole basis for treatment or other patient management decisions. Negative results must be combined with clinical observations, patient history, and epidemiological information. The expected result is Negative. Fact Sheet for Patients: SugarRoll[employer]be Fact Sheet for Healthcare Providers: https://www.woods-mathews.com/ This test is not yet approved or cleared by the [place] FDA and  has been authorized for detection and/or diagnosis of SARS-CoV-2 by FDA under an Emergency Use Authorization (EUA). This EUA will remain  in effect (meaning this test can be used) for the duration of the COVID-19 declaration under Section 56 4(b)(1) of the Act, 21 U.S.C. section 360bbb-3(b)(1), unless the authorization is terminated or revoked sooner. Performed at [facility], [place] [address], [place], [place] [address]     Lab Basic Metabolic Panel: No results for  input(s): NA, K, CL, CO2, GLUCOSE, BUN, CREATININE, CALCIUM, MG, PHOS in the last 168 hours. Liver Function Tests: No results for input(s): AST, ALT, ALKPHOS, BILITOT, PROT, ALBUMIN in the last 168 hours. No results for input(s): LIPASE, AMYLASE in the last 168 hours. No results for input(s): AMMONIA in the last 168 hours. CBC: No results for input(s): WBC, NEUTROABS, HGB, HCT, MCV, PLT in the last 168 hours. Cardiac Enzymes: No results for input(s): CKTOTAL, CKMB, CKMBINDEX, TROPONINI in the last 168 hours. Sepsis Labs: No results for input(s): PROCALCITON, WBC, LATICACIDVEN in the last 168 hours.  Procedures/Operations  Therapeutic temperature management to [place] 03/17/2019, 2:35 PM

## 2019-04-17 NOTE — Telephone Encounter (Signed)
Recived original signed D/C -Nikai fron [facility] was notified for pick up.

## 2019-04-24 ENCOUNTER — Ambulatory Visit: Payer: Self-pay | Admitting: Neurology

## 2020-01-15 IMAGING — DX ABDOMEN - 1 VIEW
1 series · 1 of 1 positions shown · non-contrast
Comparison: June 23, 2018

CLINICAL DATA: NG tube placement

EXAM:
ABDOMEN - 1 VIEW

[abdomen kub]
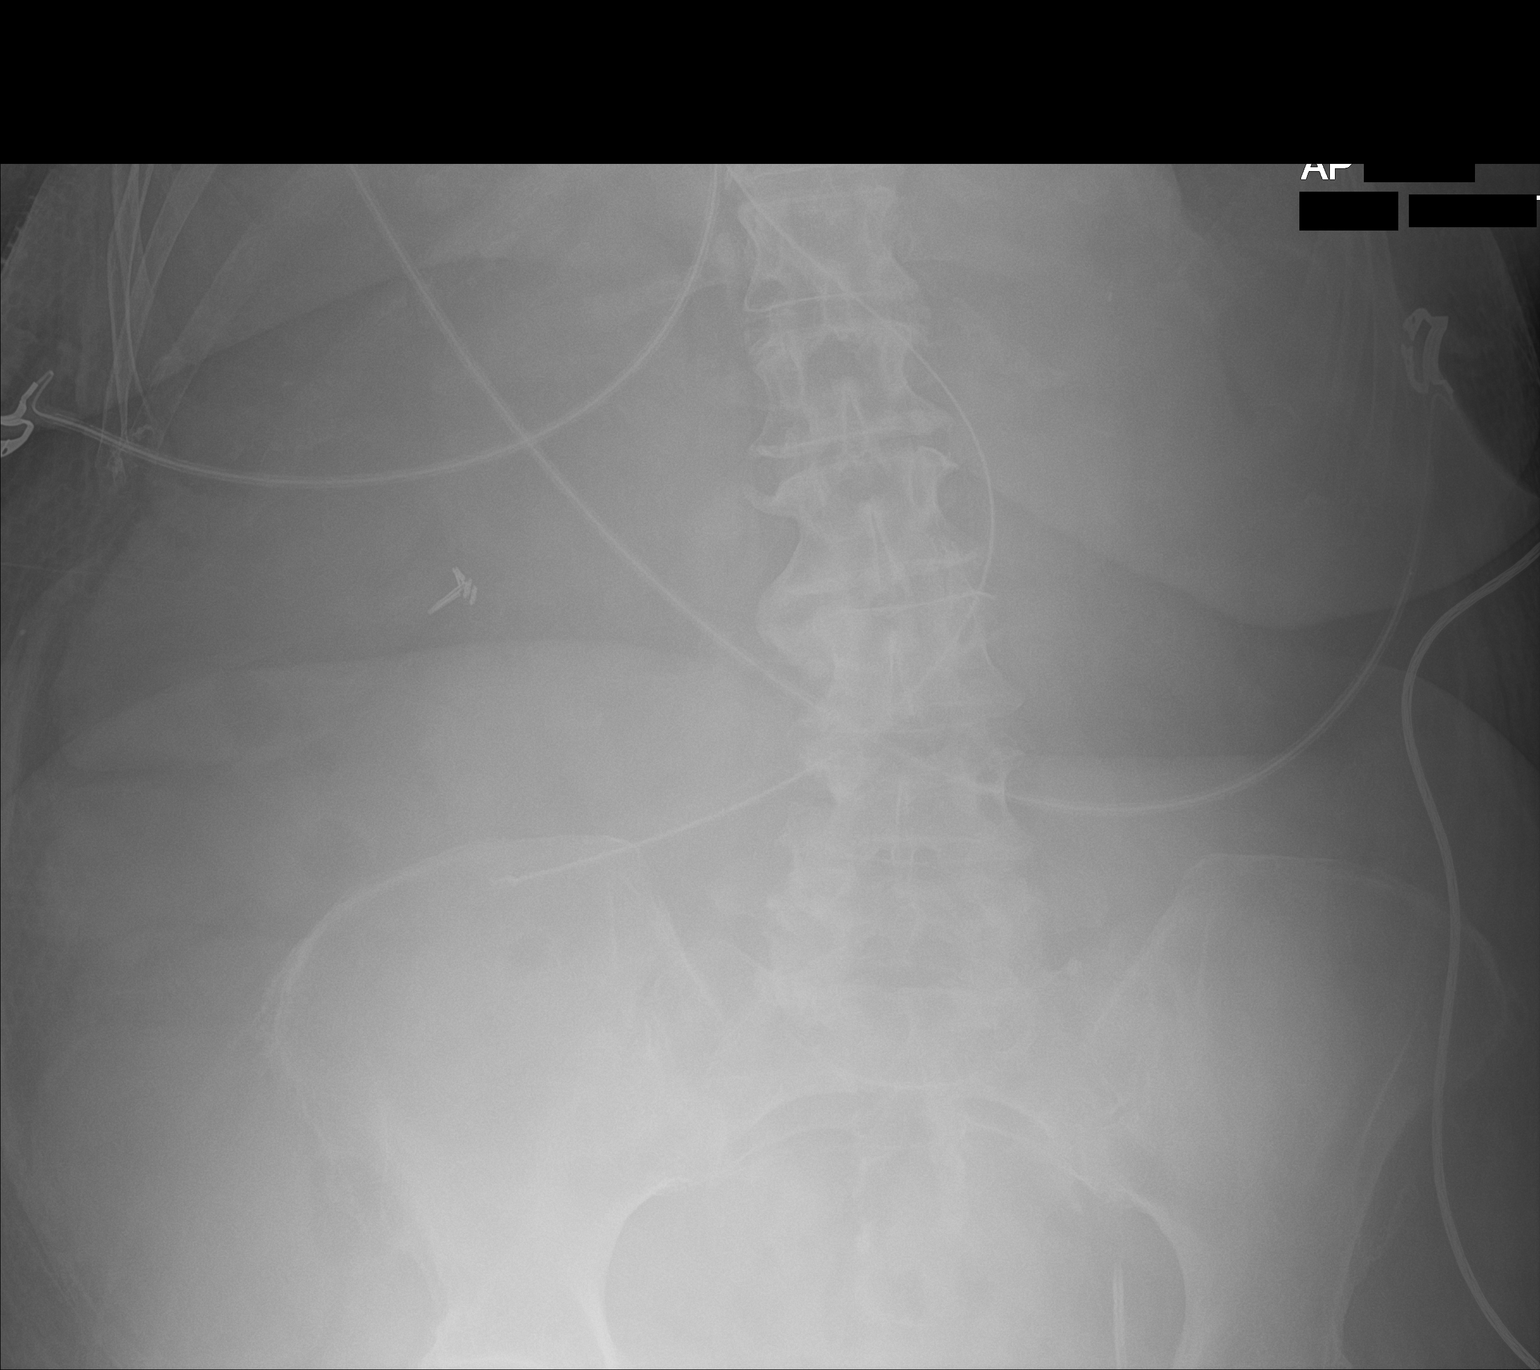

[1 of 1 positions shown; findings below may reference images not displayed]

FINDINGS: The enteric tube terminates over the gastric body. The tip is
pointed distally. The bowel gas pattern is nonspecific and
nonobstructive. There are surgical clips in the right upper
quadrant, presumably from prior cholecystectomy.
IMPRESSION: Enteric tube tip projects over the gastric body.

## 2020-01-16 IMAGING — CT CT HEAD WITHOUT CONTRAST
4 series · 16 of 47 positions shown, 18 images · non-contrast
Comparison: None.

CLINICAL DATA: S/p CPR.  Evaluate for anoxic brain injury.

EXAM:
CT HEAD WITHOUT CONTRAST
TECHNIQUE: Contiguous axial images were obtained from the base of the skull
through the vertex without intravenous contrast.

[Series 3: head without · axial · non-contrast · 0.42mm/px · z∈[-57,+53]mm · 7 of 30 slices shown, 9 images]
[im 4/30  brain]
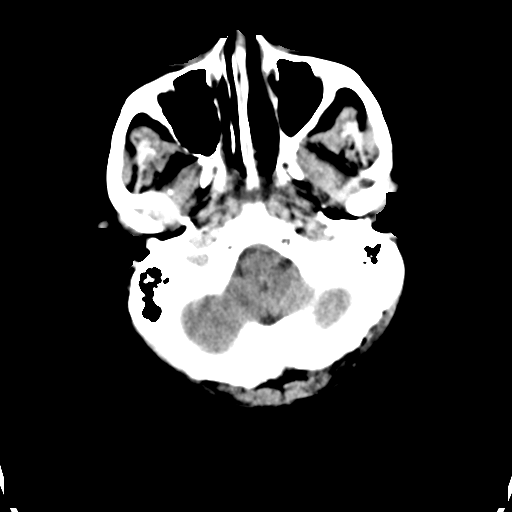
[im 4/30  bone]
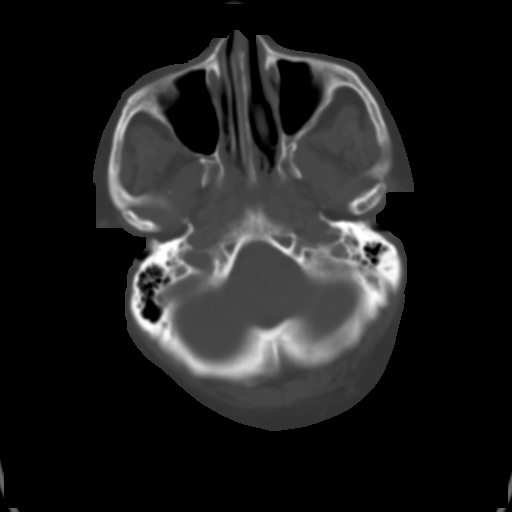
[im 8/30  brain]
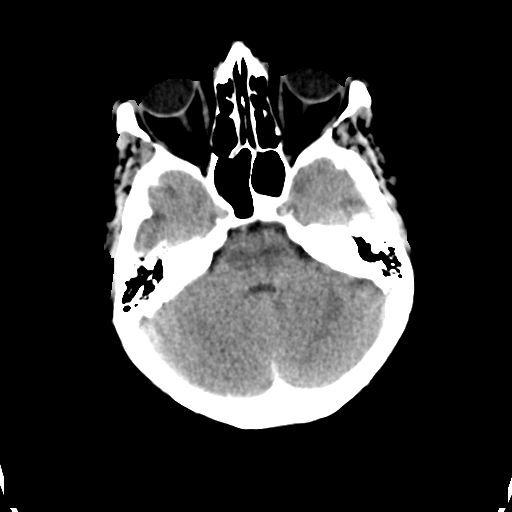
[im 11/30  brain]
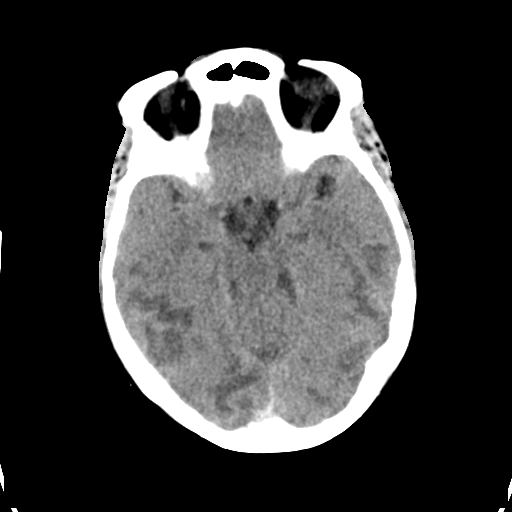
[im 15/30  brain]
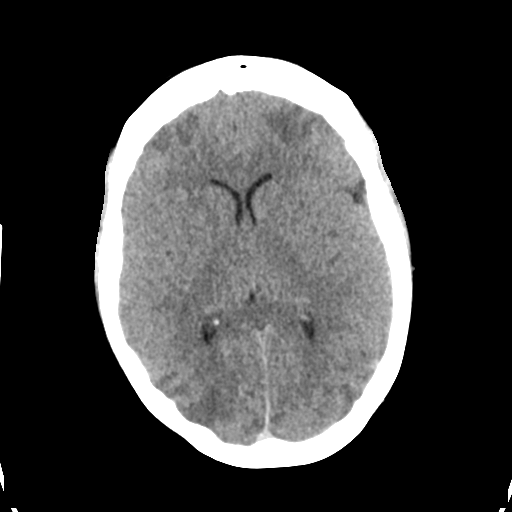
[im 19/30  brain]
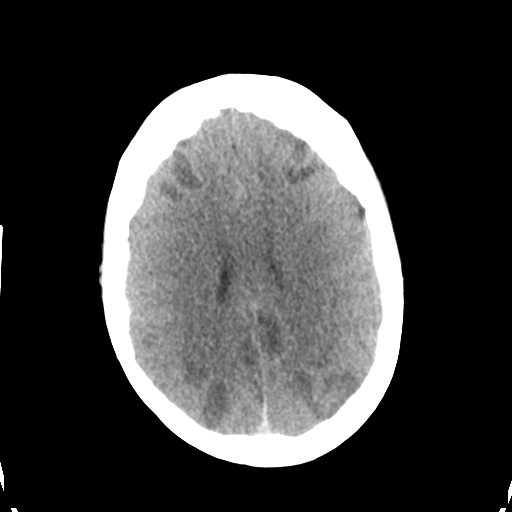
[im 19/30  bone]
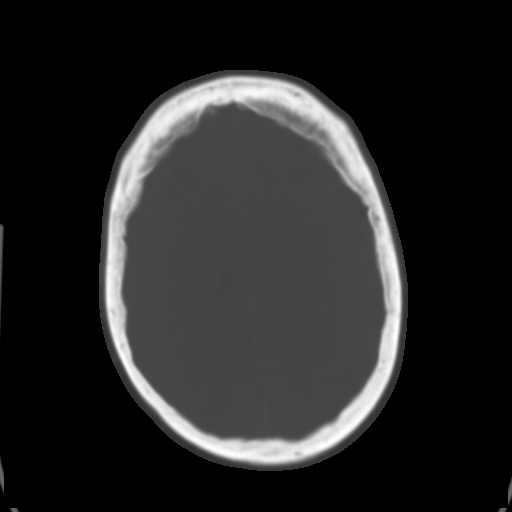
[im 22/30  brain]
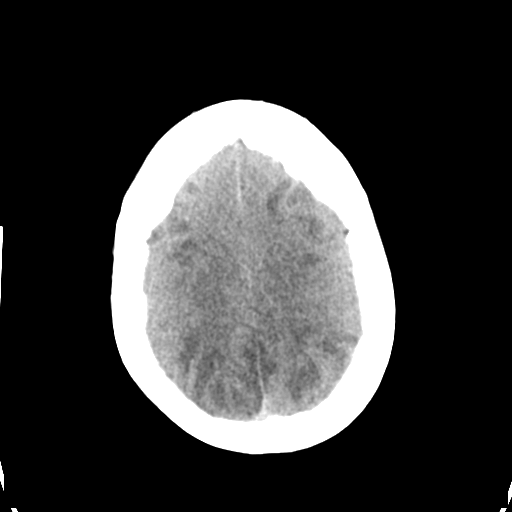
[im 26/30  brain]
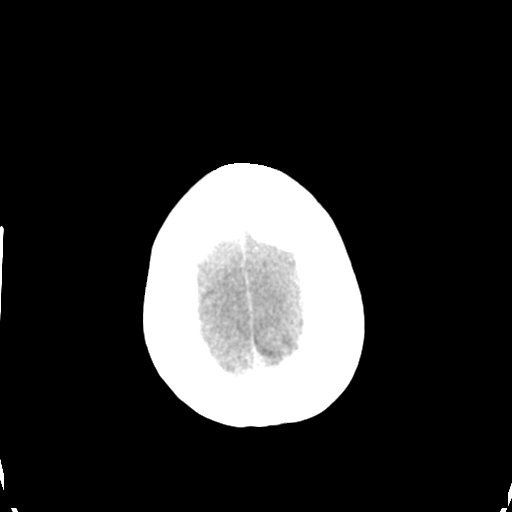

[Series 4: head bone · axial · 0.42mm/px · z∈[-58,-30]mm · 3 of 74 slices shown]
[im 8/74  bone]
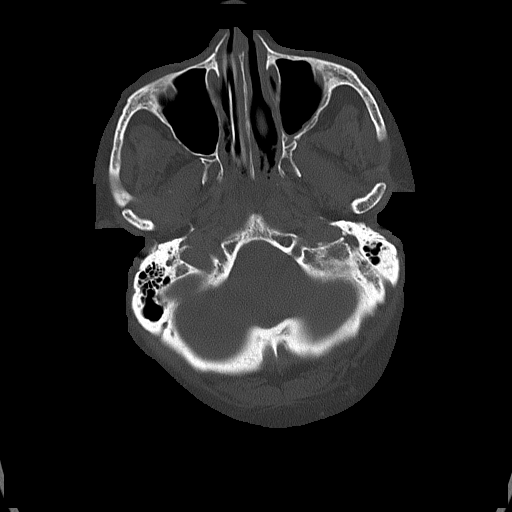
[im 15/74  bone]
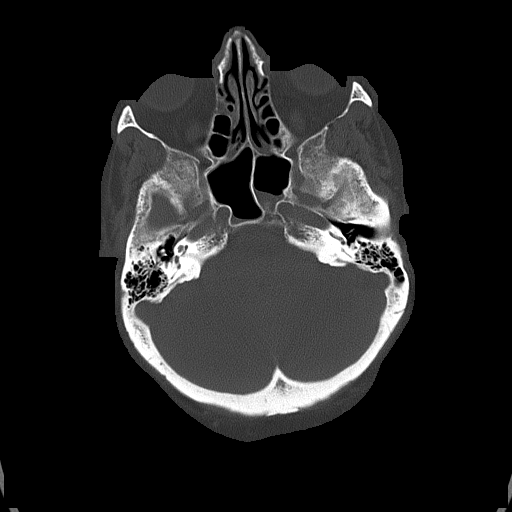
[im 22/74  bone]
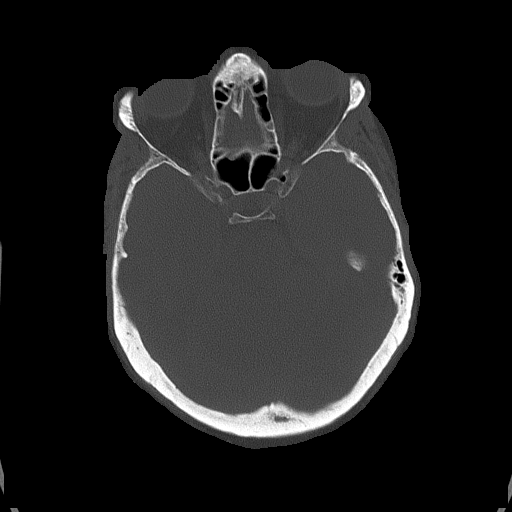

[Series 5: head without cor · coronal · non-contrast · 0.31mm/px · 3 of 61 slices shown]
[im 21/61  brain]
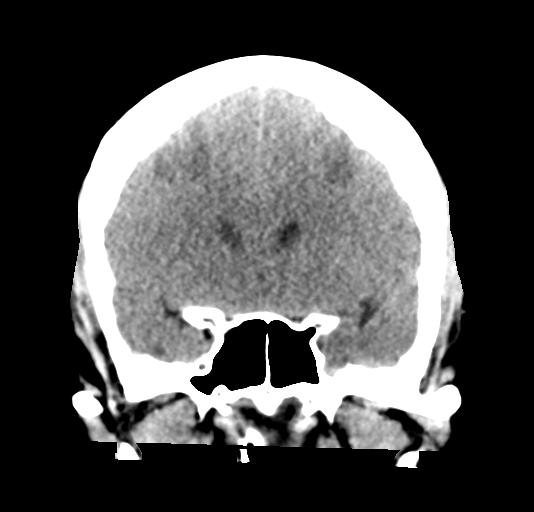
[im 27/61  brain]
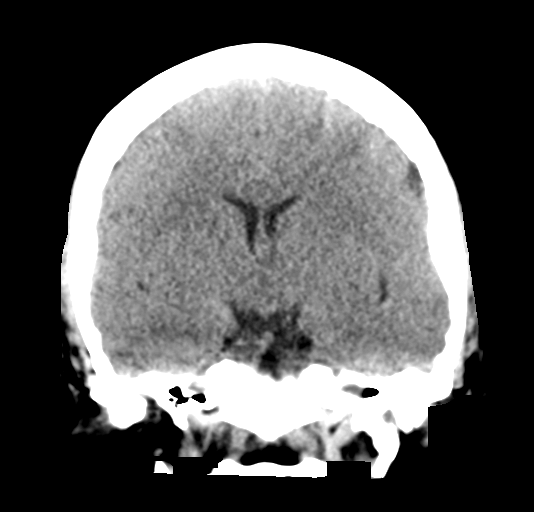
[im 34/61  brain]
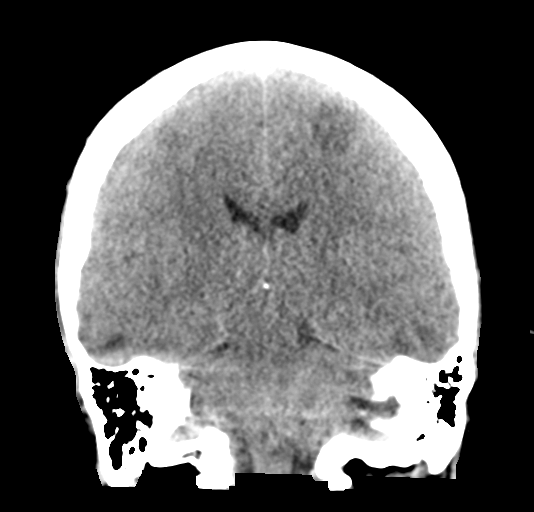

[Series 6: head without sag · sagittal · non-contrast · 0.32mm/px · 3 of 49 slices shown]
[im 17/49  brain]
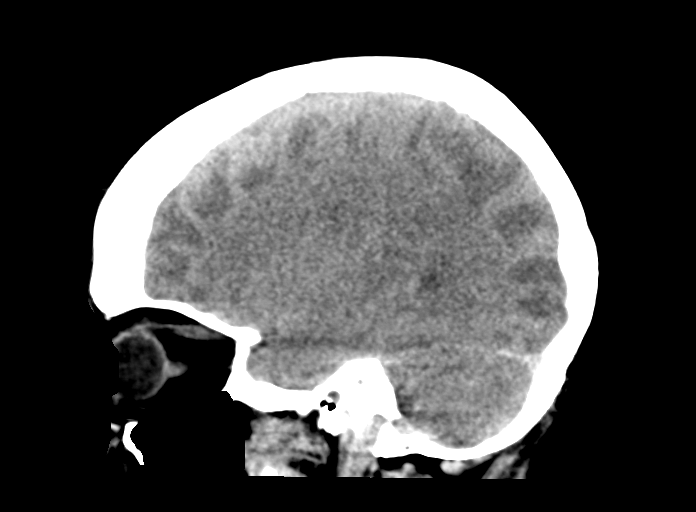
[im 25/49  brain]
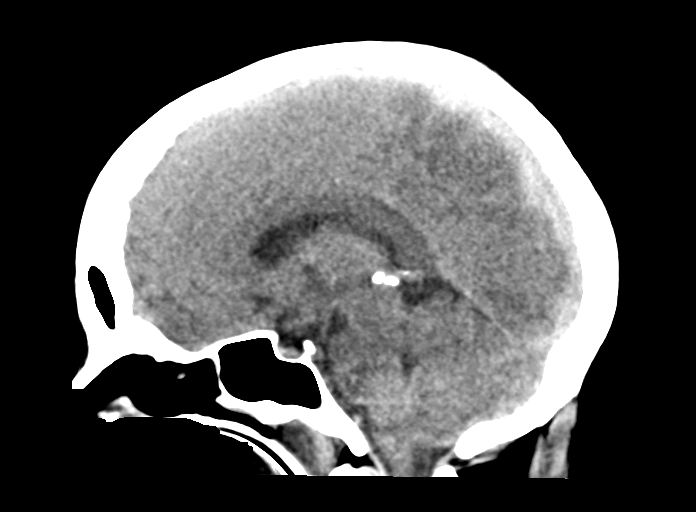
[im 33/49  brain]
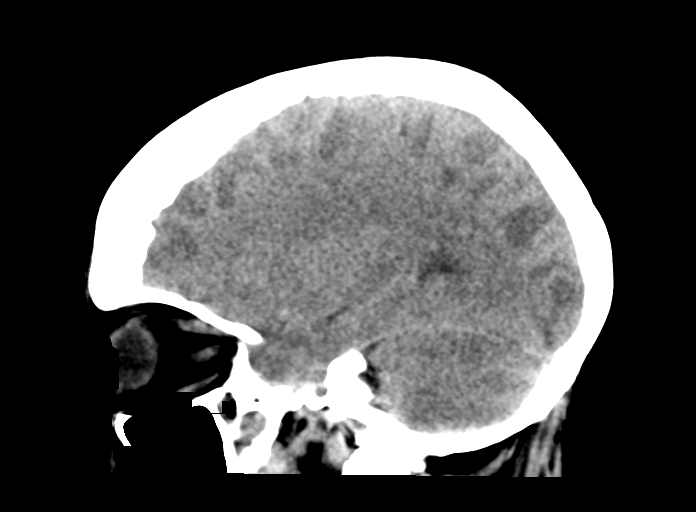

[16 of 47 positions shown; findings below may reference images not displayed]

FINDINGS: Brain: Diffuse border zone ischemia is present bilaterally.
Extensive cortical and white matter hypoattenuation is present
bilaterally. Additional diffuse white matter hypoattenuation
involves the inferior cerebellum bilaterally. There is
hypoattenuation involving the caudate head bilaterally. Lentiform
nucleus is poorly seen.

There is diffuse effacement of the sulci. Lateral and fourth
ventricles are small. Foramen magnum is patent.

Vascular: No hyperdense vessel or unexpected calcification.

Skull: Calvarium is intact. No focal lytic or blastic lesions are
present.

Sinuses/Orbits: Right-sided NG tube is in place. The paranasal
sinuses and mastoid air cells are clear. The globes and orbits are
within normal limits.
IMPRESSION: 1. Diffuse cortical and subcortical ischemia involving the border
zone is bilaterally with extensive involvement of the posterior
temporal and occipital lobes.
2. Extensive ischemic changes in the inferior cerebellum.
3. Ischemic changes of the basal ganglia.
4. No acute hemorrhage.
5. Diffuse edema with effacement of the sulci and ventricles.

The above was relayed via text pager to Dr. Tiger on 03/08/2019 at

## 2020-01-17 IMAGING — CR PORTABLE CHEST - 1 VIEW
1 series · 1 of 1 positions shown · non-contrast
Comparison: Radiograph March 07, 2019.

CLINICAL DATA: Respiratory failure.

EXAM:
PORTABLE CHEST 1 VIEW

[AP]
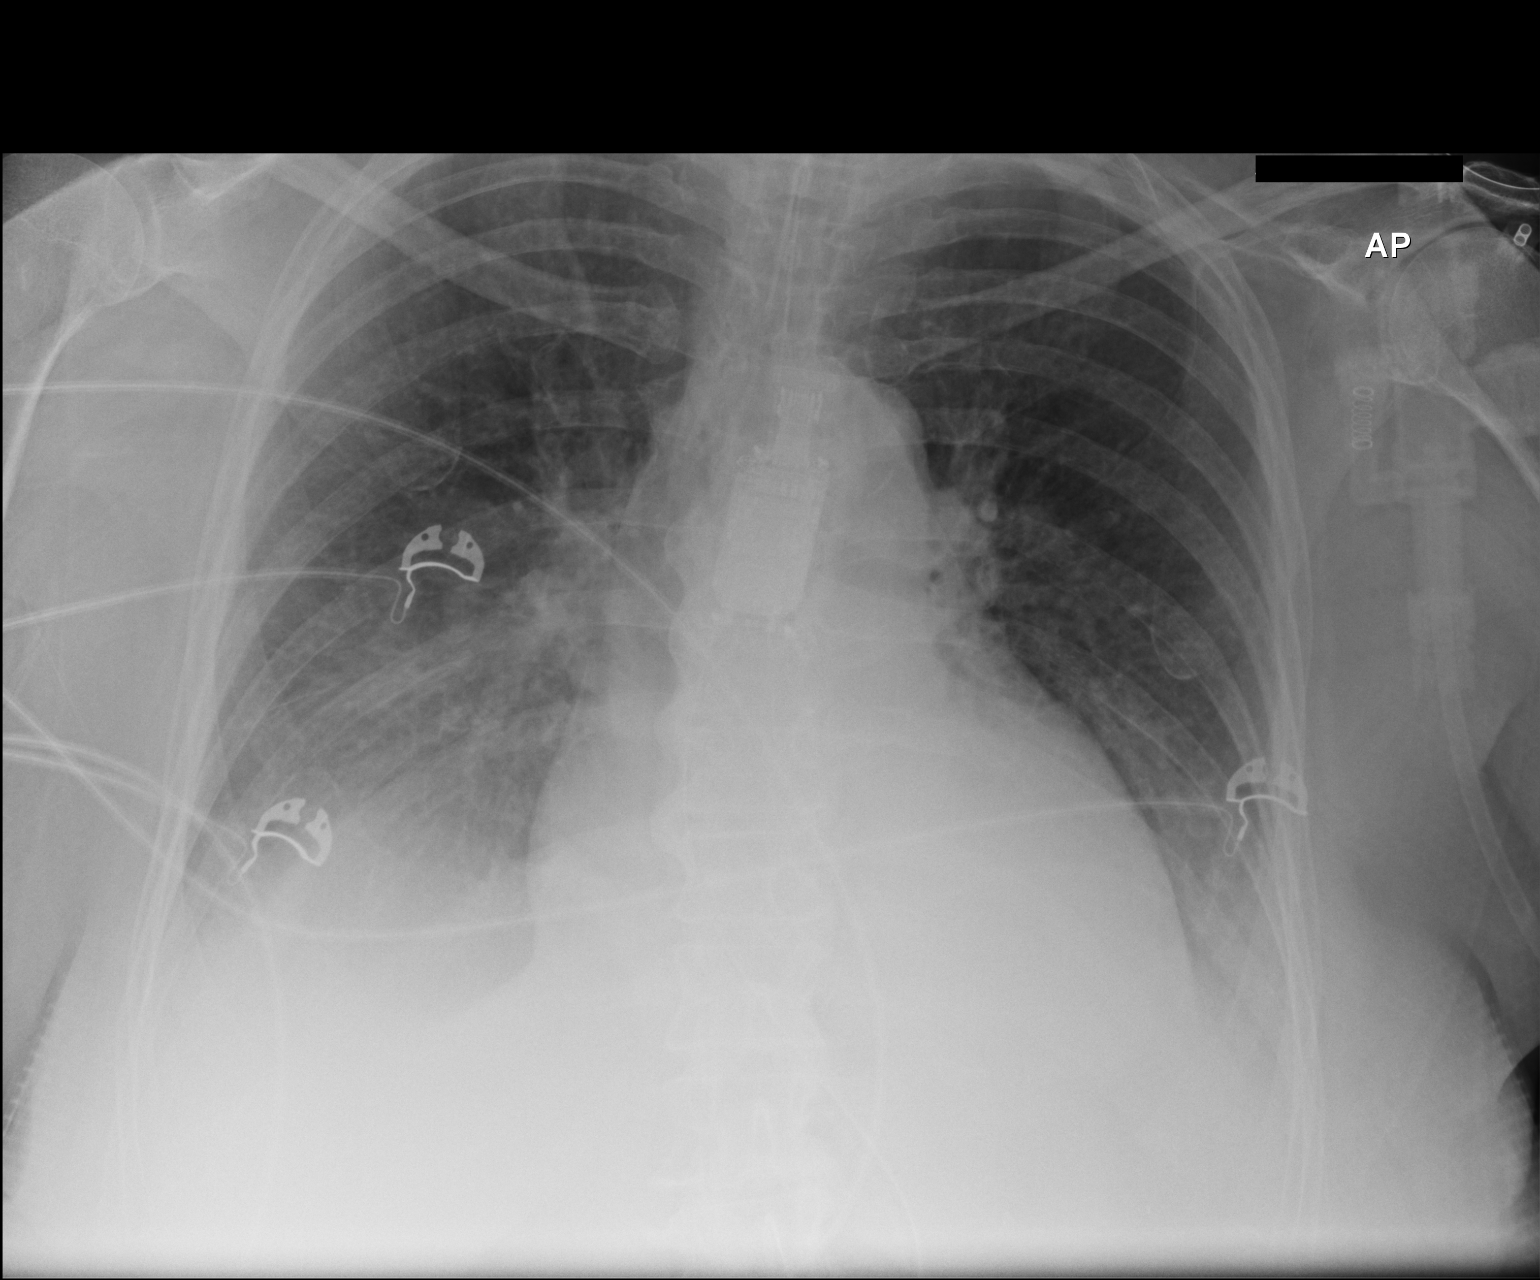

[1 of 1 positions shown; findings below may reference images not displayed]

FINDINGS: Stable cardiomediastinal silhouette. Endotracheal and nasogastric
tubes are unchanged in position. No pneumothorax is noted. Increased
bibasilar edema or atelectasis is noted with small pleural
effusions. Bony thorax is unremarkable.
IMPRESSION: Stable support apparatus. Increased bibasilar edema or atelectasis
with small pleural effusions.

## 2021-03-23 NOTE — Telephone Encounter (Signed)
error
